# Patient Record
Sex: Male | Born: 1950 | Race: Black or African American | Hispanic: No | Marital: Married | Smoking: Never smoker
Health system: Southern US, Community
[De-identification: ages and names within clinical notes are randomized; demographics above are authoritative.]

## PROBLEM LIST (undated history)

## (undated) ENCOUNTER — Ambulatory Visit (HOSPITAL_COMMUNITY): Admission: EM | Payer: Medicare PPO

## (undated) DIAGNOSIS — I10 Essential (primary) hypertension: Secondary | ICD-10-CM

## (undated) DIAGNOSIS — M109 Gout, unspecified: Secondary | ICD-10-CM

## (undated) DIAGNOSIS — I441 Atrioventricular block, second degree: Secondary | ICD-10-CM

## (undated) DIAGNOSIS — J189 Pneumonia, unspecified organism: Secondary | ICD-10-CM

## (undated) DIAGNOSIS — R011 Cardiac murmur, unspecified: Secondary | ICD-10-CM

## (undated) DIAGNOSIS — E669 Obesity, unspecified: Secondary | ICD-10-CM

## (undated) DIAGNOSIS — I509 Heart failure, unspecified: Secondary | ICD-10-CM

## (undated) DIAGNOSIS — J90 Pleural effusion, not elsewhere classified: Secondary | ICD-10-CM

## (undated) DIAGNOSIS — N529 Male erectile dysfunction, unspecified: Secondary | ICD-10-CM

## (undated) DIAGNOSIS — N400 Enlarged prostate without lower urinary tract symptoms: Secondary | ICD-10-CM

## (undated) DIAGNOSIS — E785 Hyperlipidemia, unspecified: Secondary | ICD-10-CM

## (undated) DIAGNOSIS — N189 Chronic kidney disease, unspecified: Secondary | ICD-10-CM

## (undated) HISTORY — DX: Obesity, unspecified: E66.9

## (undated) HISTORY — DX: Gout, unspecified: M10.9

## (undated) HISTORY — DX: Benign prostatic hyperplasia without lower urinary tract symptoms: N40.0

## (undated) HISTORY — DX: Hyperlipidemia, unspecified: E78.5

## (undated) HISTORY — DX: Cardiac murmur, unspecified: R01.1

## (undated) HISTORY — DX: Male erectile dysfunction, unspecified: N52.9

## (undated) HISTORY — PX: CARDIAC SURGERY: SHX584

---

## 1999-02-13 ENCOUNTER — Encounter: Payer: Self-pay | Admitting: Family Medicine

## 1999-02-13 ENCOUNTER — Ambulatory Visit (HOSPITAL_COMMUNITY): Admission: RE | Admit: 1999-02-13 | Discharge: 1999-02-13 | Payer: Self-pay | Admitting: Family Medicine

## 2000-02-26 ENCOUNTER — Ambulatory Visit (HOSPITAL_COMMUNITY): Admission: RE | Admit: 2000-02-26 | Discharge: 2000-02-26 | Payer: Self-pay | Admitting: Family Medicine

## 2000-02-26 ENCOUNTER — Encounter: Payer: Self-pay | Admitting: Family Medicine

## 2002-03-21 ENCOUNTER — Encounter: Payer: Self-pay | Admitting: Emergency Medicine

## 2002-03-21 ENCOUNTER — Emergency Department (HOSPITAL_COMMUNITY): Admission: EM | Admit: 2002-03-21 | Discharge: 2002-03-21 | Payer: Self-pay | Admitting: Emergency Medicine

## 2002-03-22 ENCOUNTER — Encounter: Payer: Self-pay | Admitting: Emergency Medicine

## 2002-03-22 ENCOUNTER — Emergency Department (HOSPITAL_COMMUNITY): Admission: EM | Admit: 2002-03-22 | Discharge: 2002-03-22 | Payer: Self-pay | Admitting: Emergency Medicine

## 2002-03-22 ENCOUNTER — Ambulatory Visit (HOSPITAL_COMMUNITY): Admission: RE | Admit: 2002-03-22 | Discharge: 2002-03-22 | Payer: Self-pay | Admitting: Gastroenterology

## 2002-03-22 ENCOUNTER — Encounter: Payer: Self-pay | Admitting: Gastroenterology

## 2002-03-23 ENCOUNTER — Ambulatory Visit (HOSPITAL_COMMUNITY): Admission: RE | Admit: 2002-03-23 | Discharge: 2002-03-23 | Payer: Self-pay | Admitting: Gastroenterology

## 2002-03-23 ENCOUNTER — Encounter: Payer: Self-pay | Admitting: Gastroenterology

## 2002-04-23 ENCOUNTER — Ambulatory Visit (HOSPITAL_COMMUNITY): Admission: RE | Admit: 2002-04-23 | Discharge: 2002-04-23 | Payer: Self-pay | Admitting: Gastroenterology

## 2002-06-26 ENCOUNTER — Encounter: Payer: Self-pay | Admitting: Emergency Medicine

## 2002-06-26 ENCOUNTER — Inpatient Hospital Stay (HOSPITAL_COMMUNITY): Admission: EM | Admit: 2002-06-26 | Discharge: 2002-07-02 | Payer: Self-pay | Admitting: Emergency Medicine

## 2002-06-27 ENCOUNTER — Encounter: Payer: Self-pay | Admitting: Thoracic Surgery (Cardiothoracic Vascular Surgery)

## 2002-06-28 ENCOUNTER — Encounter: Payer: Self-pay | Admitting: Anesthesiology

## 2004-01-08 ENCOUNTER — Emergency Department (HOSPITAL_COMMUNITY): Admission: EM | Admit: 2004-01-08 | Discharge: 2004-01-08 | Payer: Self-pay | Admitting: Family Medicine

## 2004-01-10 ENCOUNTER — Emergency Department (HOSPITAL_COMMUNITY): Admission: EM | Admit: 2004-01-10 | Discharge: 2004-01-10 | Payer: Self-pay | Admitting: Emergency Medicine

## 2004-01-30 ENCOUNTER — Ambulatory Visit (HOSPITAL_COMMUNITY): Admission: RE | Admit: 2004-01-30 | Discharge: 2004-01-30 | Payer: Self-pay | Admitting: Gastroenterology

## 2016-01-02 ENCOUNTER — Other Ambulatory Visit: Payer: Self-pay | Admitting: Gastroenterology

## 2016-11-02 ENCOUNTER — Emergency Department (HOSPITAL_COMMUNITY)
Admission: EM | Admit: 2016-11-02 | Discharge: 2016-11-02 | Disposition: A | Discharge status: Home / Self Care | Payer: Worker's Compensation | Attending: Emergency Medicine | Admitting: Emergency Medicine

## 2016-11-02 ENCOUNTER — Encounter (HOSPITAL_COMMUNITY): Payer: Self-pay | Admitting: Emergency Medicine

## 2016-11-02 ENCOUNTER — Emergency Department (HOSPITAL_COMMUNITY): Payer: Worker's Compensation

## 2016-11-02 DIAGNOSIS — S0101XA Laceration without foreign body of scalp, initial encounter: Secondary | ICD-10-CM

## 2016-11-02 DIAGNOSIS — Y990 Civilian activity done for income or pay: Secondary | ICD-10-CM | POA: Insufficient documentation

## 2016-11-02 DIAGNOSIS — Y939 Activity, unspecified: Secondary | ICD-10-CM | POA: Diagnosis not present

## 2016-11-02 DIAGNOSIS — S0181XA Laceration without foreign body of other part of head, initial encounter: Secondary | ICD-10-CM | POA: Insufficient documentation

## 2016-11-02 DIAGNOSIS — S0990XA Unspecified injury of head, initial encounter: Secondary | ICD-10-CM | POA: Insufficient documentation

## 2016-11-02 DIAGNOSIS — Y929 Unspecified place or not applicable: Secondary | ICD-10-CM | POA: Diagnosis not present

## 2016-11-02 DIAGNOSIS — W19XXXA Unspecified fall, initial encounter: Secondary | ICD-10-CM

## 2016-11-02 DIAGNOSIS — W2201XA Walked into wall, initial encounter: Secondary | ICD-10-CM | POA: Insufficient documentation

## 2016-11-02 HISTORY — DX: Essential (primary) hypertension: I10

## 2016-11-02 MED ORDER — BACITRACIN ZINC 500 UNIT/GM EX OINT
TOPICAL_OINTMENT | Freq: Once | CUTANEOUS | Status: AC
  Administered 2016-11-02: 1 via TOPICAL
  Filled 2016-11-02: qty 0.9

## 2016-11-02 MED ORDER — LIDOCAINE-EPINEPHRINE (PF) 2 %-1:200000 IJ SOLN
20.0000 mL | Freq: Once | INTRAMUSCULAR | Status: AC
  Administered 2016-11-02: 20 mL
  Filled 2016-11-02: qty 20

## 2016-11-02 MED ORDER — NAPROXEN 375 MG PO TABS
375.0000 mg | ORAL_TABLET | Freq: Once | ORAL | Status: AC
  Administered 2016-11-02: 375 mg via ORAL
  Filled 2016-11-02: qty 1

## 2016-11-02 NOTE — ED Triage Notes (Signed)
Patient is from home.  Patient tripped over a patient at work and hit his forehead on door jam.  Denies LOC or dizziness.  Patient denies neck or back pain.  Patient passed [facility] with EMS.    BP: 188/106 HR: 88 R: 20

## 2016-11-02 NOTE — ED Notes (Signed)
Patient states he has not taken his BP medication this morning.

## 2016-11-02 NOTE — ED Notes (Signed)
Bed: [facility] Expected date:  Expected time:  Means of arrival:  Comments: 66 yo f fall

## 2016-11-02 NOTE — ED Notes (Signed)
Patient is A & O x4.  He understood the AVS discharge papers without any questions.

## 2016-11-02 NOTE — ED Provider Notes (Signed)
[facility] DEPT Provider Note   CSN: [id number] Arrival date & time: 11/02/16  1041  By signing my name below, I, Andrew Schmitt, attest that this documentation has been prepared under the direction and in the presence of Doristine Devoid, PA-C. Electronically Signed: Sonum Schmitt, Scribe. 11/02/16. 11:05 AM.  History   Chief Complaint Chief Complaint  Patient presents with  . Fall    The history is provided by the patient. No language interpreter was used.    HPI Comments: Andrew Schmitt is a 66 y.o. male brought in by ambulance, who presents to the Emergency Department complaining of a fall that occurred PTA. Patient states he tripped over a patient at work, causing him to strike his forehead on the corner of a wall. He denies LOC. He currently complains of a laceration to the forehead and paresthesia to bilateral hands. He takes a daily baby ASA. He denies vision changes, lightheadedness, nausea, vomiting, photophobia, neck pain, back pain. He states his tetanus is UTD.   No past medical history on file.  There are no active problems to display for this patient.   No past surgical history on file.     Home Medications    Prior to Admission medications   Not on File    Family History No family history on file.  Social History Social History  Substance Use Topics  . Smoking status: Not on file  . Smokeless tobacco: Not on file  . Alcohol use Not on file     Allergies   Patient has no allergy information on record.   Review of Systems Review of Systems  Eyes: Negative for photophobia and visual disturbance.  Gastrointestinal: Negative for nausea and vomiting.  Musculoskeletal: Negative for back pain and neck pain.  Skin: Positive for wound.  Neurological: Negative for dizziness, syncope and light-headedness.  All other systems reviewed and are negative.    Physical Exam Updated Vital Signs BP (!) 193/101   Pulse 67   Temp 98.5 F (36.9 C)  (Oral)   Resp 16   Ht 5\' 6"  (1.676 m)   Wt 124.7 kg   SpO2 97%   BMI 44.39 kg/m   Physical Exam  Constitutional: He is oriented to person, place, and time. He appears well-developed and well-nourished.  HENT:  Head: Normocephalic.  Right Ear: External ear normal. No hemotympanum.  Left Ear: External ear normal. No hemotympanum.  Nose: Nose normal. No nasal septal hematoma.  1.5 cm laceration to the right forehead. Bleeding is controlled   Eyes: EOM are normal. Pupils are equal, round, and reactive to light.  Neck: Normal range of motion. Neck supple. No thyromegaly present.  No midline C-spine tenderness. No deformities or step-offs noted.  Cardiovascular: Normal rate, regular rhythm, normal heart sounds and intact distal pulses.  Exam reveals no gallop and no friction rub.   No murmur heard. Pulmonary/Chest: Effort normal and breath sounds normal. No respiratory distress. He has no wheezes. He has no rales.  Musculoskeletal: Normal range of motion.  No midline L-spine or T-spine tenderness. No deformities or step-offs noted. No paraspinal tenderness.  Lymphadenopathy:    He has no cervical adenopathy.  Neurological: He is alert and oriented to person, place, and time.  The patient is alert, attentive, and oriented x 3. Speech is clear. Cranial nerve II-VII grossly intact. Negative pronator drift. Sensation intact. Strength 5/5 in all extremities. Reflexes 2+ and symmetric at biceps, triceps, knees, and ankles. Rapid alternating movement and fine finger  movements intact. Romberg is absent. Posture and gait normal.    Skin: Skin is warm and dry. Capillary refill takes less than 2 seconds.  Psychiatric: He has a normal mood and affect.  Nursing note and vitals reviewed.    ED Treatments / Results  DIAGNOSTIC STUDIES: Oxygen Saturation is 100% on RA, normal by my interpretation.    COORDINATION OF CARE: 11:05 AM Discussed treatment plan with pt at bedside and pt agreed to  plan.   Labs (all labs ordered are listed, but only abnormal results are displayed) Labs Reviewed - No data to display  EKG  EKG Interpretation None       Radiology Ct Head Wo Contrast  Result Date: 11/02/2016 CLINICAL DATA:  Head injury after fall. EXAM: CT HEAD WITHOUT CONTRAST CT CERVICAL SPINE WITHOUT CONTRAST TECHNIQUE: Multidetector CT imaging of the head and cervical spine was performed following the standard protocol without intravenous contrast. Multiplanar CT image reconstructions of the cervical spine were also generated. COMPARISON:  None. FINDINGS: CT HEAD FINDINGS Brain: Mild chronic ischemic white matter disease is noted. No mass effect or midline shift is noted. Ventricular size is within normal limits. There is no evidence of mass lesion, hemorrhage or acute infarction. Vascular: No hyperdense vessel or unexpected calcification. Skull: Normal. Negative for fracture or focal lesion. Sinuses/Orbits: No acute finding. Other: None. CT CERVICAL SPINE FINDINGS Alignment: Mild reversal of normal lordosis is noted which most likely is positional in origin. Skull base and vertebrae: No acute fracture. No primary bone lesion or focal pathologic process. Soft tissues and spinal canal: No prevertebral fluid or swelling. No visible canal hematoma. Disc levels:  Mild degenerative disc disease is noted at C3-4. Upper chest: Negative. Other: Degenerative changes seen involving the posterior facet joints bilaterally IMPRESSION: Mild chronic ischemic white matter disease. No acute intracranial abnormality seen. Mild degenerative disc disease is noted at C3-4. No acute abnormality seen in the cervical spine. Electronically Signed   By: Marijo Conception, M.D.   On: 11/02/2016 12:00   Ct Cervical Spine Wo Contrast  Result Date: 11/02/2016 CLINICAL DATA:  Head injury after fall. EXAM: CT HEAD WITHOUT CONTRAST CT CERVICAL SPINE WITHOUT CONTRAST TECHNIQUE: Multidetector CT imaging of the head and  cervical spine was performed following the standard protocol without intravenous contrast. Multiplanar CT image reconstructions of the cervical spine were also generated. COMPARISON:  None. FINDINGS: CT HEAD FINDINGS Brain: Mild chronic ischemic white matter disease is noted. No mass effect or midline shift is noted. Ventricular size is within normal limits. There is no evidence of mass lesion, hemorrhage or acute infarction. Vascular: No hyperdense vessel or unexpected calcification. Skull: Normal. Negative for fracture or focal lesion. Sinuses/Orbits: No acute finding. Other: None. CT CERVICAL SPINE FINDINGS Alignment: Mild reversal of normal lordosis is noted which most likely is positional in origin. Skull base and vertebrae: No acute fracture. No primary bone lesion or focal pathologic process. Soft tissues and spinal canal: No prevertebral fluid or swelling. No visible canal hematoma. Disc levels:  Mild degenerative disc disease is noted at C3-4. Upper chest: Negative. Other: Degenerative changes seen involving the posterior facet joints bilaterally IMPRESSION: Mild chronic ischemic white matter disease. No acute intracranial abnormality seen. Mild degenerative disc disease is noted at C3-4. No acute abnormality seen in the cervical spine. Electronically Signed   By: Marijo Conception, M.D.   On: 11/02/2016 12:00    Procedures Procedures (including critical care time)  LACERATION REPAIR PROCEDURE NOTE The patient's identification  was confirmed and consent was obtained. This procedure was performed by Doristine Devoid, PA-C at 12:49 PM. Site: right forehead Sterile procedures observed Anesthetic used (type and amt): 1% lidocaine with epi, 5 cm Suture type/size: 6-0 prolene Length:2 cm # of Sutures: 7 Technique:simple interrupted Complexity: simple Antibx ointment applied Tetanus UTD Site anesthetized, irrigated with NS, explored without evidence of foreign body, wound well approximated, site  covered with dry, sterile dressing.  Patient tolerated procedure well without complications. Instructions for care discussed verbally and patient provided with additional written instructions for homecare and f/u.  Medications Ordered in ED Medications  lidocaine-EPINEPHrine (XYLOCAINE W/EPI) 2 %-1:200000 (PF) injection 20 mL (20 mLs Infiltration Given 11/02/16 1142)  naproxen (NAPROSYN) tablet 375 mg (375 mg Oral Given 11/02/16 1257)  bacitracin ointment (1 application Topical Given 11/02/16 1324)     Initial Impression / Assessment and Plan / ED Course  I have reviewed the triage vital signs and the nursing notes.  Pertinent labs & imaging results that were available during my care of the patient were reviewed by me and considered in my medical decision making (see chart for details).     Patient presents to the ED with mechanical fall today. Laceration noted to the right forehead with bleeding controlled. Imaging obtained prior to wound closure with normal head and cervical CT. Tetanus is up-to-date. No focal neuro deficits. Reports bilateral tingling in hands however sensation intact sharp/dull and proprioception intact. Pressure irrigation performed. Laceration occurred < 8 hours prior to repair which was well tolerated. Pt has no co morbidities to effect normal wound healing. Discussed suture home care w pt and answered questions. Pt to f-u for wound check and suture removal in 3-5 days. Blood pressure is elevated in the ED. History of hypertension. Has not taken his blood pressure medicine this morning. Denies any associated complaints. Encouraged to take his blood pressure medicine when he gets home. Pt is hemodynamically stable w no complaints prior to dc. Encouraged symptomatic treatment at home. Pt seen and examined by Dr. Maryan Rued who is agreeable to the above plan.     Final Clinical Impressions(s) / ED Diagnoses   Final diagnoses:  Fall, initial encounter  Laceration of scalp,  initial encounter    New Prescriptions New Prescriptions   No medications on file   I personally performed the services described in this documentation, which was scribed in my presence. The recorded information has been reviewed and is accurate.    Doristine Devoid, PA-C 11/03/16 1528    Blanchie Dessert, MD 11/03/16 1630

## 2016-11-02 NOTE — ED Notes (Signed)
Bed: [facility] Expected date:  Expected time:  Means of arrival:  Comments: EMS- 66yo M, fall/head lac

## 2016-11-02 NOTE — Discharge Instructions (Signed)
All of your imaging has been normal. You need to return or follow-up with her primary care doctor in 3-5 days for removal of the sutures. Discussed wound care. Return sooner if he developing signs of infection including redness, drainage, worsening pain, fever. Have given you education on signs and symptoms of concussion to return to the ED. Motrin and Tylenol for pain. Please take her blood pressure medicine when he gets home.

## 2016-11-02 NOTE — ED Notes (Signed)
Patient transported to CT 

## 2018-05-17 ENCOUNTER — Other Ambulatory Visit: Payer: Self-pay | Admitting: Internal Medicine

## 2018-05-25 ENCOUNTER — Ambulatory Visit: Payer: Self-pay | Admitting: Internal Medicine

## 2018-08-03 ENCOUNTER — Other Ambulatory Visit: Payer: Self-pay | Admitting: Internal Medicine

## 2018-08-15 ENCOUNTER — Other Ambulatory Visit: Payer: Self-pay | Admitting: Internal Medicine

## 2018-09-13 ENCOUNTER — Other Ambulatory Visit: Payer: Self-pay | Admitting: Internal Medicine

## 2018-09-13 MED ORDER — OSELTAMIVIR PHOSPHATE 75 MG PO CAPS: 75.0000 mg | ORAL_CAPSULE | Freq: Every day | ORAL | 0 refills | Status: DC

## 2018-10-15 ENCOUNTER — Other Ambulatory Visit: Payer: Self-pay | Admitting: Internal Medicine

## 2018-11-04 ENCOUNTER — Other Ambulatory Visit: Payer: Self-pay | Admitting: Internal Medicine

## 2018-11-08 ENCOUNTER — Other Ambulatory Visit: Payer: Self-pay | Admitting: Internal Medicine

## 2019-01-03 ENCOUNTER — Telehealth: Payer: Self-pay

## 2019-01-03 ENCOUNTER — Other Ambulatory Visit: Payer: Self-pay | Admitting: Internal Medicine

## 2019-01-03 NOTE — Telephone Encounter (Signed)
Called to make an appt with pt due to him not being seen in a long time and pt needs refills

## 2019-01-18 ENCOUNTER — Encounter: Payer: Self-pay | Admitting: Nurse Practitioner

## 2019-01-18 ENCOUNTER — Ambulatory Visit: Payer: Medicare Other | Admitting: Nurse Practitioner

## 2019-01-18 ENCOUNTER — Other Ambulatory Visit: Payer: Self-pay

## 2019-01-18 VITALS — BP 142/100 | HR 66 | Temp 98.4°F | Ht 67.2 in | Wt 275.8 lb

## 2019-01-18 DIAGNOSIS — E782 Mixed hyperlipidemia: Secondary | ICD-10-CM

## 2019-01-18 DIAGNOSIS — I10 Essential (primary) hypertension: Secondary | ICD-10-CM

## 2019-01-18 DIAGNOSIS — Z9114 Patient's other noncompliance with medication regimen: Secondary | ICD-10-CM | POA: Diagnosis not present

## 2019-01-18 LAB — POCT URINALYSIS DIPSTICK
Bilirubin, UA: NEGATIVE
Blood, UA: NEGATIVE
Glucose, UA: NEGATIVE
Ketones, UA: NEGATIVE
Leukocytes, UA: NEGATIVE
Nitrite, UA: NEGATIVE
Protein, UA: NEGATIVE
Spec Grav, UA: 1.025 (ref 1.010–1.025)
Urobilinogen, UA: 1 E.U./dL
pH, UA: 5.5 (ref 5.0–8.0)

## 2019-01-18 LAB — POCT UA - MICROALBUMIN
Albumin/Creatinine Ratio, Urine, POC: 300
Creatinine, POC: 100 mg/dL
Microalbumin Ur, POC: 150 mg/L

## 2019-01-18 NOTE — Progress Notes (Signed)
Subjective:     Patient ID: Andrew Schmitt , male    DOB: [birth year] , 68 y.o.   MRN: [id number]   Chief Complaint  Patient presents with  . Hypertension    HPI  Hypertension  This is a chronic problem. The current episode started more than 1 year ago. The problem has been gradually worsening since onset. The problem is uncontrolled. Pertinent negatives include no anxiety. There are no associated agents to hypertension. Risk factors for coronary artery disease include sedentary lifestyle, obesity, male gender, dyslipidemia and diabetes mellitus. Past treatments include calcium channel blockers and diuretics. The current treatment provides no improvement. There are no compliance problems.  There is no history of angina or kidney disease. There is no history of chronic renal disease.     Past Medical History:  Diagnosis Date  . Gout   . Hypertension      No family history on file.   Current Outpatient Medications:  .  allopurinol (ZYLOPRIM) 300 MG tablet, TAKE 1 TABLET BY MOUTH EVERY DAY, Disp: 90 tablet, Rfl: 1 .  amLODipine (NORVASC) 10 MG tablet, Take 10 mg by mouth daily., Disp: , Rfl:  .  aspirin EC 81 MG tablet, Take 81 mg by mouth daily., Disp: , Rfl:  .  carvedilol (COREG) 6.25 MG tablet, TAKE ONE TABLET BY MOUTH TWICE A DAY WITH FOOD, Disp: 180 tablet, Rfl: 0 .  chlorthalidone (HYGROTON) 50 MG tablet, Take 50 mg by mouth daily., Disp: , Rfl:  .  cholecalciferol (VITAMIN D) 1000 units tablet, Take 1,000 Units by mouth daily., Disp: , Rfl:  .  finasteride (PROSCAR) 5 MG tablet, Take 5 mg by mouth daily., Disp: , Rfl:  .  furosemide (LASIX) 20 MG tablet, Take 20 mg by mouth daily., Disp: , Rfl:  .  hydrALAZINE (APRESOLINE) 50 MG tablet, Take 50 mg by mouth 2 (two) times daily., Disp: , Rfl:  .  rosuvastatin (CRESTOR) 10 MG tablet, TAKE 1 TABLET BY MOUTH EVERY DAY, Disp: 90 tablet, Rfl: 0 .  tamsulosin (FLOMAX) 0.4 MG CAPS capsule, Take 0.4 mg by mouth daily., Disp: , Rfl:     No Known Allergies   Review of Systems   Today's Vitals   01/18/19 1132  BP: (!) 142/100  Pulse: 66  Temp: 98.4 F (36.9 C)  TempSrc: Oral  Weight: 275 lb 12.8 oz (125.1 kg)  Height: 5' 7.2" (1.707 m)  PainSc: 0-No pain   Body mass index is 42.94 kg/m.   Objective:  Physical Exam Vitals signs reviewed.  Constitutional:      Appearance: Normal appearance.  Cardiovascular:     Rate and Rhythm: Normal rate and regular rhythm.     Pulses: Normal pulses.     Heart sounds: Normal heart sounds. No murmur.  Pulmonary:     Effort: Pulmonary effort is normal.     Breath sounds: Normal breath sounds.  Skin:    General: Skin is warm and dry.     Capillary Refill: Capillary refill takes less than 2 seconds.  Neurological:     General: No focal deficit present.     Mental Status: He is alert and oriented to person, place, and time.  Psychiatric:        Mood and Affect: Mood normal.        Behavior: Behavior normal.        Thought Content: Thought content normal.        Judgment: Judgment normal.  Assessment And Plan:     1. Essential hypertension  Chronic  Poorly controlled, he has been without his blood pressure medications.  I discussed with him the importance of compliance to care and coming back for regular follow ups  He was educated on the risk for stroke and heart attack with elevated blood pressures.  Encouraged to limit intake of high salt foods.  He will need to return for follow up blood pressure visit in a couple weeks - POCT Urinalysis Dipstick (81002) - POCT UA - Microalbumin - CBC no Diff - CMP14 + Anion Gap  2. Mixed hyperlipidemia  Chronic, controlled  Continue with current medications - Lipid Profile   Minette Brine, FNP    THE PATIENT IS ENCOURAGED TO PRACTICE SOCIAL DISTANCING DUE TO THE COVID-19 PANDEMIC.

## 2019-01-19 LAB — LIPID PANEL
Chol/HDL Ratio: 3.9 ratio (ref 0.0–5.0)
Cholesterol, Total: 145 mg/dL (ref 100–199)
HDL: 37 mg/dL — ABNORMAL LOW (ref >39)
LDL Calculated: 71 mg/dL (ref 0–99)
Triglycerides: 185 mg/dL — ABNORMAL HIGH (ref 0–149)
VLDL Cholesterol Cal: 37 mg/dL (ref 5–40)

## 2019-01-19 LAB — CMP14 + ANION GAP
ALT: 18 IU/L (ref 0–44)
AST: 21 IU/L (ref 0–40)
Albumin/Globulin Ratio: 1.5 (ref 1.2–2.2)
Albumin: 4.4 g/dL (ref 3.8–4.8)
Alkaline Phosphatase: 67 IU/L (ref 39–117)
Anion Gap: 13 mmol/L (ref 10.0–18.0)
BUN/Creatinine Ratio: 20 (ref 10–24)
BUN: 32 mg/dL — ABNORMAL HIGH (ref 8–27)
Bilirubin Total: 0.6 mg/dL (ref 0.0–1.2)
CO2: 25 mmol/L (ref 20–29)
Calcium: 9.2 mg/dL (ref 8.6–10.2)
Chloride: 100 mmol/L (ref 96–106)
Creatinine, Ser: 1.57 mg/dL — ABNORMAL HIGH (ref 0.76–1.27)
GFR calc Af Amer: 51 mL/min/{1.73_m2} — ABNORMAL LOW (ref >59)
GFR calc non Af Amer: 44 mL/min/{1.73_m2} — ABNORMAL LOW (ref >59)
Globulin, Total: 2.9 g/dL (ref 1.5–4.5)
Glucose: 113 mg/dL — ABNORMAL HIGH (ref 65–99)
Potassium: 4.1 mmol/L (ref 3.5–5.2)
Sodium: 138 mmol/L (ref 134–144)
Total Protein: 7.3 g/dL (ref 6.0–8.5)

## 2019-01-19 LAB — CBC
Hematocrit: 38.9 % (ref 37.5–51.0)
Hemoglobin: 12.7 g/dL — ABNORMAL LOW (ref 13.0–17.7)
MCH: 29.8 pg (ref 26.6–33.0)
MCHC: 32.6 g/dL (ref 31.5–35.7)
MCV: 91 fL (ref 79–97)
Platelets: 168 10*3/uL (ref 150–450)
RBC: 4.26 x10E6/uL (ref 4.14–5.80)
RDW: 11.8 % (ref 11.6–15.4)
WBC: 4.5 10*3/uL (ref 3.4–10.8)

## 2019-02-12 ENCOUNTER — Encounter: Payer: Self-pay | Admitting: Nurse Practitioner

## 2019-03-05 ENCOUNTER — Other Ambulatory Visit: Payer: Self-pay

## 2019-03-05 ENCOUNTER — Other Ambulatory Visit: Payer: Self-pay | Admitting: Internal Medicine

## 2019-03-05 MED ORDER — HYDRALAZINE HCL 100 MG PO TABS: 100.0000 mg | ORAL_TABLET | Freq: Three times a day (TID) | ORAL | 3 refills | Status: DC

## 2019-03-07 DIAGNOSIS — E7800 Pure hypercholesterolemia, unspecified: Secondary | ICD-10-CM | POA: Insufficient documentation

## 2019-03-07 DIAGNOSIS — E785 Hyperlipidemia, unspecified: Secondary | ICD-10-CM | POA: Insufficient documentation

## 2019-03-07 DIAGNOSIS — I119 Hypertensive heart disease without heart failure: Secondary | ICD-10-CM | POA: Insufficient documentation

## 2019-03-07 DIAGNOSIS — I259 Chronic ischemic heart disease, unspecified: Secondary | ICD-10-CM | POA: Insufficient documentation

## 2019-03-07 NOTE — Progress Notes (Signed)
Subjective:  Primary Physician:  Glendale Chard, MD  Patient ID: Andrew Schmitt, male    DOB: 1950/10/03, 68 y.o.   MRN: [id number]  Chief Complaint  Patient presents with  . Hypertension  . Follow-up   HPI: Andrew Schmitt  is a 68 y.o. male who presents for a follow-up of hypertensive heart disease and ischemic heart disease. Patient feels short of breath  on walking at incline, unchanged from before There is no dyspnea at rest and no orthopnea or PND. Patient denies complaints of chest pain, tightness or pressure. No palpitation, dizziness, near-syncope or syncope. He has chronic swelling on the legs. No complaints of leg claudication.  Patient has hypertension and hypercholesterolemia. No history of diabetes. He does not smoke.  Patient has chronic kidney disease stage 3b. He also has history of gout, GERD, mild anemia, BPH and erectile dysfunction. Patient has morbid obesity. He does not walk regularly. Patient has been taking care of his grandchild at home.  No history of thyroid problems. No history of TIA or CVA.  Past Medical History:  Diagnosis Date  . Gout   . Hypertension     Past Surgical History:  Procedure Laterality Date  . CARDIAC SURGERY- Pericardiocentesis- 2002      Social History   Socioeconomic History  . Marital status: Married    Spouse name: Not on file  . Number of children: Not on file  . Years of education: Not on file  . Highest education level: Not on file  Occupational History  . Not on file  Social Needs  . Financial resource strain: Not on file  . Food insecurity    Worry: Not on file    Inability: Not on file  . Transportation needs    Medical: Not on file    Non-medical: Not on file  Tobacco Use  . Smoking status: Never Smoker  . Smokeless tobacco: Never Used  Substance and Sexual Activity  . Alcohol use: No  . Drug use: No  . Sexual activity: Never  Lifestyle  . Physical activity    Days per week: Not on file   Minutes per session: Not on file  . Stress: Not on file  Relationships  . Social Herbalist on phone: Not on file    Gets together: Not on file    Attends religious service: Not on file    Active member of club or organization: Not on file    Attends meetings of clubs or organizations: Not on file    Relationship status: Not on file  . Intimate partner violence    Fear of current or ex partner: Not on file    Emotionally abused: Not on file    Physically abused: Not on file    Forced sexual activity: Not on file  Other Topics Concern  . Not on file  Social History Narrative  . Not on file    Current Outpatient Medications on File Prior to Visit  Medication Sig Dispense Refill  . alfuzosin (UROXATRAL) 10 MG 24 hr tablet Take 10 mg by mouth daily with breakfast.    . allopurinol (ZYLOPRIM) 300 MG tablet TAKE 1 TABLET BY MOUTH EVERY DAY 90 tablet 1  . amLODipine (NORVASC) 10 MG tablet Take 10 mg by mouth daily.    Marland Kitchen aspirin EC 81 MG tablet Take 81 mg by mouth daily.    . carvedilol (COREG) 6.25 MG tablet TAKE ONE TABLET BY MOUTH TWICE  A DAY WITH FOOD 180 tablet 0  . chlorthalidone (HYGROTON) 50 MG tablet Take 50 mg by mouth daily.    . cholecalciferol (VITAMIN D) 1000 units tablet Take 1,000 Units by mouth daily.    Marland Kitchen docusate sodium (COLACE) 100 MG capsule Take 100 mg by mouth 2 (two) times daily.    . finasteride (PROSCAR) 5 MG tablet Take 5 mg by mouth daily.    . furosemide (LASIX) 20 MG tablet TAKE 1 TABLET BY MOUTH DAILY 90 tablet 1  . Garlic 10 MG CAPS Take by mouth.    . hydrALAZINE (APRESOLINE) 100 MG tablet Take 1 tablet (100 mg total) by mouth 3 (three) times daily. 90 tablet 3  . ibuprofen (ADVIL) 200 MG tablet Take 200 mg by mouth every 6 (six) hours as needed.    . linaclotide (LINZESS) 145 MCG CAPS capsule Take 145 mcg by mouth daily before breakfast.    . rosuvastatin (CRESTOR) 10 MG tablet TAKE 1 TABLET BY MOUTH EVERY DAY 90 tablet 1  . sildenafil  (REVATIO) 20 MG tablet Take 1 tablet (20 mg total) by mouth daily as needed. 30 tablet 3  . tamsulosin (FLOMAX) 0.4 MG CAPS capsule Take 0.4 mg by mouth daily.    . vitamin B-12 (CYANOCOBALAMIN) 500 MCG tablet Take 500 mcg by mouth daily.     No current facility-administered medications on file prior to visit.     Review of Systems  Constitutional: Negative for fever.  HENT: Negative for nosebleeds.   Eyes: Negative for blurred vision.  Respiratory: Positive for shortness of breath (on exertion). Negative for cough.   Cardiovascular: Positive for leg swelling (mild). Negative for chest pain and palpitations.  Gastrointestinal: Negative for abdominal pain, nausea and vomiting.  Genitourinary: Negative for dysuria.  Musculoskeletal: Negative for myalgias.  Skin: Negative for itching and rash.  Neurological: Negative for dizziness, seizures and loss of consciousness.  Psychiatric/Behavioral: The patient is not nervous/anxious.       Objective:  Blood pressure 129/71, pulse 65, temperature 97.7 F (36.5 C), height 5\' 7"  (1.702 m), weight 275 lb (124.7 kg), SpO2 97 %. Body mass index is 43.07 kg/m.  Physical Exam  Constitutional: He is oriented to person, place, and time. He appears well-developed.  Morbidly obese.  HENT:  Head: Normocephalic and atraumatic.  Eyes: Conjunctivae are normal.  Neck: No thyromegaly present.  Cardiovascular: Normal rate, regular rhythm and normal heart sounds. Exam reveals no gallop.  No murmur heard. Pulses:      Carotid pulses are 2+ on the right side and 2+ on the left side.      Dorsalis pedis pulses are 2+ on the right side and 2+ on the left side.  Pulmonary/Chest: He has no wheezes. He has no rales.  Abdominal: He exhibits no mass. There is no abdominal tenderness.  Musculoskeletal:        General: Edema (1+ edema on legs, feet) present.  Lymphadenopathy:    He has no cervical adenopathy.  Neurological: He is alert and oriented to person,  place, and time.  Skin: Skin is warm.    CARDIAC STUDIES:  Echocardiogram 05/27/2016: Left ventricle cavity is normal in size. Severe concentric hypertrophy of the left ventricle with septal and posterior wall measurement of 17-18 mm. Normal global wall motion. Doppler evidence of grade II (pseudonormal) diastolic dysfunction. Diastolic dysfunction do not suggest elevated LA/LV endiastolic pressure. Calculated EF 74%. Trace aortic regurgitation. Trace mitral regurgitation. Trace tricuspid regurgitation. Unable to estimate PA pressure  due to absence/minimal TR signal.  Lexiscan myoview stress test 05/07/2016: 1. The resting electrocardiogram demonstrated normal sinus rhythm, normal resting conduction and no resting arrhythmias. Stress EKG is non-diagnostic for ischemia as it a pharmacologic stress using Lexiscan. 2. The raw images reveal the left ventricle to be mildly dilated at 186 mL in both rest and stress images. Perfusion imaging reveals a small to moderate-sized inferior wall scar extending from the base towards the apex with no significant peri-infarct ischemia. In addition there is a very small sized severe ischemia in the apical lateral wall extending from the base towards the apex probably in the distribution of the diagonal branch of the LAD. Left ventricular systolic function was preserved with inferior wall hypokinesis, EF 59%. This is an intermediate risk study, clinical correlation recommended in a patient with BMI 45.  Assessment & Recommendations:   1. Hypertension with heart disease  2. Ischemic heart disease EKG- Sinus  Rhythm  -First degree A-V block  Incomplete RBBB. -Left atrial enlargement. Can't R/O old inf. MI.  3. Hyperlipidemia  4. Morbid obesity ([place])  Laboratory Exam:  CBC Latest Ref Rng & Units 01/18/2019  WBC 3.4 - 10.8 x10E3/uL 4.5  Hemoglobin 13.0 - 17.7 g/dL 12.7(L)  Hematocrit 37.5 - 51.0 % 38.9  Platelets 150 - 450 x10E3/uL 168   CMP Latest  Ref Rng & Units 01/18/2019  Glucose 65 - 99 mg/dL 113(H)  BUN 8 - 27 mg/dL 32(H)  Creatinine 0.76 - 1.27 mg/dL 1.57(H)  Sodium 134 - 144 mmol/L 138  Potassium 3.5 - 5.2 mmol/L 4.1  Chloride 96 - 106 mmol/L 100  CO2 20 - 29 mmol/L 25  Calcium 8.6 - 10.2 mg/dL 9.2  Total Protein 6.0 - 8.5 g/dL 7.3  Total Bilirubin 0.0 - 1.2 mg/dL 0.6  Alkaline Phos 39 - 117 IU/L 67  AST 0 - 40 IU/L 21  ALT 0 - 44 IU/L 18   Lipid Panel     Component Value Date/Time   CHOL 145 01/18/2019 1619   TRIG 185 (H) 01/18/2019 1619   HDL 37 (L) 01/18/2019 1619   CHOLHDL 3.9 01/18/2019 1619   LDLCALC 71 01/18/2019 1619   Recommendation:  Patient's blood pressure is well controlled. Continue present medications. He was advised to continue monitoring blood pressure at home and call us if it stays high. Cholesterol is also well controlled, continue present medications.  Patient does not have any chest pain. We will continue medical therapy for ischemic heart disease.  Primary prevention was discussed. I have again advised him to follow strict diet to lose weight. He was also advised low-salt, low-cholesterol diet. He was advised to start walking regularly as tolerated.  I will see him in follow-up after 6 months but call us earlier if there are any cardiac problems. Patient will have follow-up echocardiogram before the next visit.  Despina Hick, MD, [facility] 03/13/2019, 4:10 PM [facility] [place] Pager: [phone] Office: [phone] If no answer Cell [phone]

## 2019-03-12 ENCOUNTER — Other Ambulatory Visit: Payer: Self-pay

## 2019-03-12 MED ORDER — SILDENAFIL CITRATE 20 MG PO TABS: 20.0000 mg | ORAL_TABLET | Freq: Every day | ORAL | 3 refills | Status: DC | PRN

## 2019-03-13 ENCOUNTER — Encounter: Payer: Self-pay | Admitting: Cardiology

## 2019-03-13 ENCOUNTER — Other Ambulatory Visit: Payer: Self-pay

## 2019-03-13 ENCOUNTER — Ambulatory Visit: Payer: Medicare Other | Admitting: Cardiology

## 2019-03-13 VITALS — BP 129/71 | HR 65 | Temp 97.7°F | Ht 67.0 in | Wt 275.0 lb

## 2019-03-13 DIAGNOSIS — I119 Hypertensive heart disease without heart failure: Secondary | ICD-10-CM

## 2019-03-13 DIAGNOSIS — I259 Chronic ischemic heart disease, unspecified: Secondary | ICD-10-CM

## 2019-03-13 DIAGNOSIS — E782 Mixed hyperlipidemia: Secondary | ICD-10-CM

## 2019-03-29 ENCOUNTER — Ambulatory Visit (INDEPENDENT_AMBULATORY_CARE_PROVIDER_SITE_OTHER): Payer: Medicare Other

## 2019-03-29 ENCOUNTER — Ambulatory Visit (INDEPENDENT_AMBULATORY_CARE_PROVIDER_SITE_OTHER): Payer: Medicare Other | Admitting: Internal Medicine

## 2019-03-29 ENCOUNTER — Other Ambulatory Visit: Payer: Self-pay

## 2019-03-29 ENCOUNTER — Encounter: Payer: Self-pay | Admitting: Internal Medicine

## 2019-03-29 VITALS — BP 130/70 | HR 78 | Temp 98.9°F | Ht 67.0 in | Wt 274.2 lb

## 2019-03-29 VITALS — BP 156/78 | HR 78 | Temp 98.9°F | Ht 67.0 in | Wt 274.2 lb

## 2019-03-29 DIAGNOSIS — I119 Hypertensive heart disease without heart failure: Secondary | ICD-10-CM

## 2019-03-29 DIAGNOSIS — Z0001 Encounter for general adult medical examination with abnormal findings: Secondary | ICD-10-CM | POA: Diagnosis not present

## 2019-03-29 DIAGNOSIS — Z0000 Encounter for general adult medical examination without abnormal findings: Secondary | ICD-10-CM | POA: Diagnosis not present

## 2019-03-29 DIAGNOSIS — Z1211 Encounter for screening for malignant neoplasm of colon: Secondary | ICD-10-CM | POA: Diagnosis not present

## 2019-03-29 DIAGNOSIS — Z125 Encounter for screening for malignant neoplasm of prostate: Secondary | ICD-10-CM

## 2019-03-29 DIAGNOSIS — I10 Essential (primary) hypertension: Secondary | ICD-10-CM | POA: Diagnosis not present

## 2019-03-29 DIAGNOSIS — E7800 Pure hypercholesterolemia, unspecified: Secondary | ICD-10-CM

## 2019-03-29 DIAGNOSIS — R609 Edema, unspecified: Secondary | ICD-10-CM

## 2019-03-29 LAB — POCT URINALYSIS DIPSTICK
Bilirubin, UA: NEGATIVE
Blood, UA: NEGATIVE
Glucose, UA: NEGATIVE
Ketones, UA: NEGATIVE
Leukocytes, UA: NEGATIVE
Nitrite, UA: NEGATIVE
Protein, UA: POSITIVE — AB
Spec Grav, UA: 1.02 (ref 1.010–1.025)
Urobilinogen, UA: 0.2 E.U./dL
pH, UA: 6.5 (ref 5.0–8.0)

## 2019-03-29 LAB — CMP14 + ANION GAP
ALT: 18 IU/L (ref 0–44)
AST: 22 IU/L (ref 0–40)
Albumin/Globulin Ratio: 1.5 (ref 1.2–2.2)
Albumin: 4.8 g/dL (ref 3.8–4.8)
Alkaline Phosphatase: 75 IU/L (ref 39–117)
Anion Gap: 16 mmol/L (ref 10.0–18.0)
BUN/Creatinine Ratio: 17 (ref 10–24)
BUN: 27 mg/dL (ref 8–27)
Bilirubin Total: 0.7 mg/dL (ref 0.0–1.2)
CO2: 25 mmol/L (ref 20–29)
Calcium: 9.7 mg/dL (ref 8.6–10.2)
Chloride: 98 mmol/L (ref 96–106)
Creatinine, Ser: 1.57 mg/dL — ABNORMAL HIGH (ref 0.76–1.27)
GFR calc Af Amer: 51 mL/min/{1.73_m2} — ABNORMAL LOW (ref >59)
GFR calc non Af Amer: 44 mL/min/{1.73_m2} — ABNORMAL LOW (ref >59)
Globulin, Total: 3.3 g/dL (ref 1.5–4.5)
Glucose: 104 mg/dL — ABNORMAL HIGH (ref 65–99)
Potassium: 3.9 mmol/L (ref 3.5–5.2)
Sodium: 139 mmol/L (ref 134–144)
Total Protein: 8.1 g/dL (ref 6.0–8.5)

## 2019-03-29 LAB — CBC
Hematocrit: 39.4 % (ref 37.5–51.0)
Hemoglobin: 12.9 g/dL — ABNORMAL LOW (ref 13.0–17.7)
MCH: 30.5 pg (ref 26.6–33.0)
MCHC: 32.7 g/dL (ref 31.5–35.7)
MCV: 93 fL (ref 79–97)
Platelets: 159 10*3/uL (ref 150–450)
RBC: 4.23 x10E6/uL (ref 4.14–5.80)
RDW: 11.9 % (ref 11.6–15.4)
WBC: 4.6 10*3/uL (ref 3.4–10.8)

## 2019-03-29 NOTE — Patient Instructions (Addendum)
Andrew Schmitt , Thank you for taking time to come for your Medicare Wellness Visit. I appreciate your ongoing commitment to your health goals. Please review the following plan we discussed and let me know if I can assist you in the future.   Screening recommendations/referrals: Colonoscopy: 12/2015 Recommended yearly ophthalmology/optometry visit for glaucoma screening and checkup Recommended yearly dental visit for hygiene and checkup  Vaccinations: Influenza vaccine: declines Pneumococcal vaccine: 06/2016 Tdap vaccine: 07/2011 Shingles vaccine: discussed    Advanced directives: Please bring a copy of your POA (Power of [place]) and/or Living Will to your next appointment.    Conditions/risks identified: obesity  Next appointment: 04/03/2020 at 9:30  Preventive Care 76 Years and Older, Male Preventive care refers to lifestyle choices and visits with your health care provider that can promote health and wellness. What does preventive care include?  A yearly physical exam. This is also called an annual well check.  Dental exams once or twice a year.  Routine eye exams. Ask your health care provider how often you should have your eyes checked.  Personal lifestyle choices, including:  Daily care of your teeth and gums.  Regular physical activity.  Eating a healthy diet.  Avoiding tobacco and drug use.  Limiting alcohol use.  Practicing safe sex.  Taking low doses of aspirin every day.  Taking vitamin and mineral supplements as recommended by your health care provider. What happens during an annual well check? The services and screenings done by your health care provider during your annual well check will depend on your age, overall health, lifestyle risk factors, and family history of disease. Counseling  Your health care provider may ask you questions about your:  Alcohol use.  Tobacco use.  Drug use.  Emotional well-being.  Home and relationship well-being.   Sexual activity.  Eating habits.  History of falls.  Memory and ability to understand (cognition).  Work and work Statistician. Screening  You may have the following tests or measurements:  Height, weight, and BMI.  Blood pressure.  Lipid and cholesterol levels. These may be checked every 5 years, or more frequently if you are over 53 years old.  Skin check.  Lung cancer screening. You may have this screening every year starting at age 55 if you have a 30-pack-year history of smoking and currently smoke or have quit within the past 15 years.  Fecal occult blood test (FOBT) of the stool. You may have this test every year starting at age 63.  Flexible sigmoidoscopy or colonoscopy. You may have a sigmoidoscopy every 5 years or a colonoscopy every 10 years starting at age 77.  Prostate cancer screening. Recommendations will vary depending on your family history and other risks.  Hepatitis C blood test.  Hepatitis B blood test.  Sexually transmitted disease (STD) testing.  Diabetes screening. This is done by checking your blood sugar (glucose) after you have not eaten for a while (fasting). You may have this done every 1-3 years.  Abdominal aortic aneurysm (AAA) screening. You may need this if you are a current or former smoker.  Osteoporosis. You may be screened starting at age 84 if you are at high risk. Talk with your health care provider about your test results, treatment options, and if necessary, the need for more tests. Vaccines  Your health care provider may recommend certain vaccines, such as:  Influenza vaccine. This is recommended every year.  Tetanus, diphtheria, and acellular pertussis (Tdap, Td) vaccine. You may need a Td booster every  10 years.  Zoster vaccine. You may need this after age 5.  Pneumococcal 13-valent conjugate (PCV13) vaccine. One dose is recommended after age 12.  Pneumococcal polysaccharide (PPSV23) vaccine. One dose is recommended after  age 31. Talk to your health care provider about which screenings and vaccines you need and how often you need them. This information is not intended to replace advice given to you by your health care provider. Make sure you discuss any questions you have with your health care provider. Document Released: 08/29/2015 Document Revised: 04/21/2016 Document Reviewed: 06/03/2015 Elsevier Interactive Patient Education  2017 [employer] Prevention in the Home Falls can cause injuries. They can happen to people of all ages. There are many things you can do to make your home safe and to help prevent falls. What can I do on the outside of my home?  Regularly fix the edges of walkways and driveways and fix any cracks.  Remove anything that might make you trip as you walk through a door, such as a raised step or threshold.  Trim any bushes or trees on the path to your home.  Use bright outdoor lighting.  Clear any walking paths of anything that might make someone trip, such as rocks or tools.  Regularly check to see if handrails are loose or broken. Make sure that both sides of any steps have handrails.  Any raised decks and porches should have guardrails on the edges.  Have any leaves, snow, or ice cleared regularly.  Use sand or salt on walking paths during winter.  Clean up any spills in your garage right away. This includes oil or grease spills. What can I do in the bathroom?  Use night lights.  Install grab bars by the toilet and in the tub and shower. Do not use towel bars as grab bars.  Use non-skid mats or decals in the tub or shower.  If you need to sit down in the shower, use a plastic, non-slip stool.  Keep the floor dry. Clean up any water that spills on the floor as soon as it happens.  Remove soap buildup in the tub or shower regularly.  Attach bath mats securely with double-sided non-slip rug tape.  Do not have throw rugs and other things on the floor that can  make you trip. What can I do in the bedroom?  Use night lights.  Make sure that you have a light by your bed that is easy to reach.  Do not use any sheets or blankets that are too big for your bed. They should not hang down onto the floor.  Have a firm chair that has side arms. You can use this for support while you get dressed.  Do not have throw rugs and other things on the floor that can make you trip. What can I do in the kitchen?  Clean up any spills right away.  Avoid walking on wet floors.  Keep items that you use a lot in easy-to-reach places.  If you need to reach something above you, use a strong step stool that has a grab bar.  Keep electrical cords out of the way.  Do not use floor polish or wax that makes floors slippery. If you must use wax, use non-skid floor wax.  Do not have throw rugs and other things on the floor that can make you trip. What can I do with my stairs?  Do not leave any items on the stairs.  Make sure  that there are handrails on both sides of the stairs and use them. Fix handrails that are broken or loose. Make sure that handrails are as long as the stairways.  Check any carpeting to make sure that it is firmly attached to the stairs. Fix any carpet that is loose or worn.  Avoid having throw rugs at the top or bottom of the stairs. If you do have throw rugs, attach them to the floor with carpet tape.  Make sure that you have a light switch at the top of the stairs and the bottom of the stairs. If you do not have them, ask someone to add them for you. What else can I do to help prevent falls?  Wear shoes that:  Do not have high heels.  Have rubber bottoms.  Are comfortable and fit you well.  Are closed at the toe. Do not wear sandals.  If you use a stepladder:  Make sure that it is fully opened. Do not climb a closed stepladder.  Make sure that both sides of the stepladder are locked into place.  Ask someone to hold it for you,  if possible.  Clearly mark and make sure that you can see:  Any grab bars or handrails.  First and last steps.  Where the edge of each step is.  Use tools that help you move around (mobility aids) if they are needed. These include:  Canes.  Walkers.  Scooters.  Crutches.  Turn on the lights when you go into a dark area. Replace any light bulbs as soon as they burn out.  Set up your furniture so you have a clear path. Avoid moving your furniture around.  If any of your floors are uneven, fix them.  If there are any pets around you, be aware of where they are.  Review your medicines with your doctor. Some medicines can make you feel dizzy. This can increase your chance of falling. Ask your doctor what other things that you can do to help prevent falls. This information is not intended to replace advice given to you by your health care provider. Make sure you discuss any questions you have with your health care provider. Document Released: 05/29/2009 Document Revised: 01/08/2016 Document Reviewed: 09/06/2014 Elsevier Interactive Patient Education  2017 [employer].

## 2019-03-29 NOTE — Patient Instructions (Signed)
Preventive Care 29 Years and Older, Male Preventive care refers to lifestyle choices and visits with your health care provider that can promote health and wellness. This includes:  A yearly physical exam. This is also called an annual well check.  Regular dental and eye exams.  Immunizations.  Screening for certain conditions.  Healthy lifestyle choices, such as diet and exercise. What can I expect for my preventive care visit? Physical exam Your health care provider will check:  Height and weight. These may be used to calculate body mass index (BMI), which is a measurement that tells if you are at a healthy weight.  Heart rate and blood pressure.  Your skin for abnormal spots. Counseling Your health care provider may ask you questions about:  Alcohol, tobacco, and drug use.  Emotional well-being.  Home and relationship well-being.  Sexual activity.  Eating habits.  History of falls.  Memory and ability to understand (cognition).  Work and work Statistician. What immunizations do I need?  Influenza (flu) vaccine  This is recommended every year. Tetanus, diphtheria, and pertussis (Tdap) vaccine  You may need a Td booster every 10 years. Varicella (chickenpox) vaccine  You may need this vaccine if you have not already been vaccinated. Zoster (shingles) vaccine  You may need this after age 84. Pneumococcal conjugate (PCV13) vaccine  One dose is recommended after age 51. Pneumococcal polysaccharide (PPSV23) vaccine  One dose is recommended after age 53. Measles, mumps, and rubella (MMR) vaccine  You may need at least one dose of MMR if you were born in 1957 or later. You may also need a second dose. Meningococcal conjugate (MenACWY) vaccine  You may need this if you have certain conditions. Hepatitis A vaccine  You may need this if you have certain conditions or if you travel or work in places where you may be exposed to hepatitis A. Hepatitis B vaccine   You may need this if you have certain conditions or if you travel or work in places where you may be exposed to hepatitis B. Haemophilus influenzae type b (Hib) vaccine  You may need this if you have certain conditions. You may receive vaccines as individual doses or as more than one vaccine together in one shot (combination vaccines). Talk with your health care provider about the risks and benefits of combination vaccines. What tests do I need? Blood tests  Lipid and cholesterol levels. These may be checked every 5 years, or more frequently depending on your overall health.  Hepatitis C test.  Hepatitis B test. Screening  Lung cancer screening. You may have this screening every year starting at age 41 if you have a 30-pack-year history of smoking and currently smoke or have quit within the past 15 years.  Colorectal cancer screening. All adults should have this screening starting at age 59 and continuing until age 28. Your health care provider may recommend screening at age 56 if you are at increased risk. You will have tests every 1-10 years, depending on your results and the type of screening test.  Prostate cancer screening. Recommendations will vary depending on your family history and other risks.  Diabetes screening. This is done by checking your blood sugar (glucose) after you have not eaten for a while (fasting). You may have this done every 1-3 years.  Abdominal aortic aneurysm (AAA) screening. You may need this if you are a current or former smoker.  Sexually transmitted disease (STD) testing. Follow these instructions at home: Eating and drinking  Eat  a diet that includes fresh fruits and vegetables, whole grains, lean protein, and low-fat dairy products. Limit your intake of foods with high amounts of sugar, saturated fats, and salt.  Take vitamin and mineral supplements as recommended by your health care provider.  Do not drink alcohol if your health care provider  tells you not to drink.  If you drink alcohol: ? Limit how much you have to 0-2 drinks a day. ? Be aware of how much alcohol is in your drink. In the [place] one drink equals one 12 oz bottle of beer (355 mL), one 5 oz glass of wine (148 mL), or one 1 oz glass of hard liquor (44 mL). Lifestyle  Take daily care of your teeth and gums.  Stay active. Exercise for at least 30 minutes on 5 or more days each week.  Do not use any products that contain nicotine or tobacco, such as cigarettes, e-cigarettes, and chewing tobacco. If you need help quitting, ask your health care provider.  If you are sexually active, practice safe sex. Use a condom or other form of protection to prevent STIs (sexually transmitted infections).  Talk with your health care provider about taking a low-dose aspirin or statin. What's next?  Visit your health care provider once a year for a well check visit.  Ask your health care provider how often you should have your eyes and teeth checked.  Stay up to date on all vaccines. This information is not intended to replace advice given to you by your health care provider. Make sure you discuss any questions you have with your health care provider. Document Released: 08/29/2015 Document Revised: 07/27/2018 Document Reviewed: 07/27/2018 Elsevier Patient Education  2020 [employer].

## 2019-03-29 NOTE — Progress Notes (Signed)
Subjective:     Patient ID: Andrew Schmitt , male    DOB: [birth year] , 67 y.o.   MRN: [id number]   Chief Complaint  Patient presents with  . Annual Exam    HPI  Pt is here for medicare wellness visit and annual physical. Has seen his cardiologist this month and every thing is stable. He denies any complaints today. He walks 10-15  in the house a couple of times a week, but does not go out to walk.  Past Medical History:  Diagnosis Date  . Gout   . Hypertension      No family history on file.   Current Outpatient Medications:  .  alfuzosin (UROXATRAL) 10 MG 24 hr tablet, Take 10 mg by mouth daily with breakfast., Disp: , Rfl:  .  allopurinol (ZYLOPRIM) 300 MG tablet, TAKE 1 TABLET BY MOUTH EVERY DAY, Disp: 90 tablet, Rfl: 1 .  amLODipine (NORVASC) 10 MG tablet, Take 10 mg by mouth daily., Disp: , Rfl:  .  aspirin EC 81 MG tablet, Take 81 mg by mouth daily., Disp: , Rfl:  .  carvedilol (COREG) 6.25 MG tablet, TAKE ONE TABLET BY MOUTH TWICE A DAY WITH FOOD, Disp: 180 tablet, Rfl: 0 .  chlorthalidone (HYGROTON) 50 MG tablet, Take 50 mg by mouth daily., Disp: , Rfl:  .  cholecalciferol (VITAMIN D) 1000 units tablet, Take 1,000 Units by mouth daily., Disp: , Rfl:  .  docusate sodium (COLACE) 100 MG capsule, Take 100 mg by mouth 2 (two) times daily., Disp: , Rfl:  .  finasteride (PROSCAR) 5 MG tablet, Take 5 mg by mouth daily., Disp: , Rfl:  .  furosemide (LASIX) 20 MG tablet, TAKE 1 TABLET BY MOUTH DAILY, Disp: 90 tablet, Rfl: 1 .  Garlic 10 MG CAPS, Take by mouth., Disp: , Rfl:  .  hydrALAZINE (APRESOLINE) 100 MG tablet, Take 1 tablet (100 mg total) by mouth 3 (three) times daily., Disp: 90 tablet, Rfl: 3 .  ibuprofen (ADVIL) 200 MG tablet, Take 200 mg by mouth every 6 (six) hours as needed., Disp: , Rfl:  .  linaclotide (LINZESS) 145 MCG CAPS capsule, Take 145 mcg by mouth daily before breakfast., Disp: , Rfl:  .  rosuvastatin (CRESTOR) 10 MG tablet, TAKE 1 TABLET BY MOUTH EVERY  DAY, Disp: 90 tablet, Rfl: 1 .  sildenafil (REVATIO) 20 MG tablet, Take 1 tablet (20 mg total) by mouth daily as needed., Disp: 30 tablet, Rfl: 3 .  tamsulosin (FLOMAX) 0.4 MG CAPS capsule, Take 0.4 mg by mouth daily., Disp: , Rfl:  .  vitamin B-12 (CYANOCOBALAMIN) 500 MCG tablet, Take 500 mcg by mouth daily., Disp: , Rfl:    No Known Allergies   Review of Systems  Mild edema on ankles and feet, otherwise neg.  Today's Vitals   03/29/19 1009  BP: (!) 156/78  Pulse: 78  Temp: 98.9 F (37.2 C)  TempSrc: Oral  Weight: 274 lb 3.2 oz (124.4 kg)  Height: 5\' 7"  (1.702 m)  PainSc: 0-No pain   Body mass index is 42.95 kg/m.   Objective:  Physical Exam his wt is 1 lb down since 1 month Repeated BP 130/70 BP 130/70   Pulse 78   Temp 98.9 F (37.2 C) (Oral)   Ht 5\' 7"  (1.702 m)   Wt 274 lb 3.2 oz (124.4 kg)   BMI 42.95 kg/m   General Appearance:    Alert, cooperative, no distress, appears stated age  Head:  Normocephalic, without obvious abnormality, atraumatic  Eyes:    PERRL, conjunctiva/corneas clear, EOM's intact, fundi    benign, both eyes       Ears:    Normal TM's and external ear canals, both ears  Nose:   Nares normal, septum midline, mucosa normal, no drainage   or sinus tenderness  Throat:   Lips, mucosa, and tongue normal; teeth and gums normal  Neck:   Supple, symmetrical, trachea midline, no adenopathy;       thyroid:  No enlargement/tenderness/nodules; no carotid   bruit   Back:     Symmetric, no curvature, ROM normal, no CVA tenderness  Lungs:     Clear to auscultation bilaterally, respirations unlabored  Chest wall:    No tenderness or deformity  Heart:    Regular rate and rhythm, S1 and S2 normal, no murmur, rub   or gallop  Abdomen:     Soft, non-tender, bowel sounds active all four quadrants,    no masses, no organomegaly  Genitalia:    Normal male without lesion, discharge or tenderness  Rectal:    Normal tone, normal prostate, no masses or  tenderness;   guaiac negative stool  Extremities:   Extremities normal, atraumatic, no cyanosis or edema  Pulses:   2+ and symmetric all extremities  Skin:   Skin color, texture, turgor normal, no rashes or lesions  Lymph nodes:   Cervical, supraclavicular, and axillary nodes normal  Neurologic:   CNII-XII intact. Normal strength, sensation and reflexes      Throughout. Has slight trouble maintaining his balance with tandem gait, but had normal tip toe and heel gait. Normal Romberg.    Assessment And Plan:    1. Screening for prostate cancer- screen  2. Screen for colon cancer- screen    Neg hemoccult  3. Hypertension with heart disease- not optimal control today. May continue same meds and continue FU with his cardiologist.  - CMP14 + Anion Gap - CBC no Diff  4. Pure hypercholesterolemia- chronic. May continue same meds  5. Morbid obesity ([place])- chronic. Advised to increase his physical activity.   6. Encounter for general adult medical examination with abnormal findings- routine. FU 1y  7. Edema, unspecified type- advised to avoid sleeping on a recliner and to elevate legs above heart. May also get knee high support stockings to help   Andrew Schnieders RODRIGUEZ-SOUTHWORTH, PA-C    THE PATIENT IS ENCOURAGED TO PRACTICE SOCIAL DISTANCING DUE TO THE COVID-19 PANDEMIC.

## 2019-03-29 NOTE — Progress Notes (Signed)
Subjective:   Andrew Schmitt is a 68 y.o. male who presents for an Initial Medicare Annual Wellness Visit.  Review of Systems  n/a Cardiac Risk Factors include: advanced age (>60men, >52 women);hypertension;male gender;obesity (BMI >30kg/m2);sedentary lifestyle    Objective:    Today's Vitals   03/29/19 0947  BP: (!) 156/78  Pulse: 78  Temp: 98.9 F (37.2 C)  TempSrc: Oral  SpO2: 97%  Weight: 274 lb 3.2 oz (124.4 kg)  Height: 5\' 7"  (1.702 m)   Body mass index is 42.95 kg/m.  Advanced Directives 03/29/2019 11/02/2016  Does Patient Have a Medical Advance Directive? Yes No  Type of Paramedic of [place];Living will -  Copy of [place] in Chart? No - copy requested -  Would patient like information on creating a medical advance directive? - No - Patient declined    Current Medications (verified) Outpatient Encounter Medications as of 03/29/2019  Medication Sig  . alfuzosin (UROXATRAL) 10 MG 24 hr tablet Take 10 mg by mouth daily with breakfast.  . allopurinol (ZYLOPRIM) 300 MG tablet TAKE 1 TABLET BY MOUTH EVERY DAY  . amLODipine (NORVASC) 10 MG tablet Take 10 mg by mouth daily.  Marland Kitchen aspirin EC 81 MG tablet Take 81 mg by mouth daily.  . carvedilol (COREG) 6.25 MG tablet TAKE ONE TABLET BY MOUTH TWICE A DAY WITH FOOD  . chlorthalidone (HYGROTON) 50 MG tablet Take 50 mg by mouth daily.  . cholecalciferol (VITAMIN D) 1000 units tablet Take 1,000 Units by mouth daily.  Marland Kitchen docusate sodium (COLACE) 100 MG capsule Take 100 mg by mouth 2 (two) times daily.  . finasteride (PROSCAR) 5 MG tablet Take 5 mg by mouth daily.  . furosemide (LASIX) 20 MG tablet TAKE 1 TABLET BY MOUTH DAILY  . Garlic 10 MG CAPS Take by mouth.  . hydrALAZINE (APRESOLINE) 100 MG tablet Take 1 tablet (100 mg total) by mouth 3 (three) times daily.  Marland Kitchen ibuprofen (ADVIL) 200 MG tablet Take 200 mg by mouth every 6 (six) hours as needed.  . linaclotide (LINZESS) 145 MCG  CAPS capsule Take 145 mcg by mouth daily before breakfast.  . rosuvastatin (CRESTOR) 10 MG tablet TAKE 1 TABLET BY MOUTH EVERY DAY  . sildenafil (REVATIO) 20 MG tablet Take 1 tablet (20 mg total) by mouth daily as needed.  . tamsulosin (FLOMAX) 0.4 MG CAPS capsule Take 0.4 mg by mouth daily.  . vitamin B-12 (CYANOCOBALAMIN) 500 MCG tablet Take 500 mcg by mouth daily.   No facility-administered encounter medications on file as of 03/29/2019.     Allergies (verified) Patient has no known allergies.   History: Past Medical History:  Diagnosis Date  . Gout   . Hypertension    Past Surgical History:  Procedure Laterality Date  . CARDIAC SURGERY     History reviewed. No pertinent family history. Social History   Socioeconomic History  . Marital status: Married    Spouse name: Not on file  . Number of children: Not on file  . Years of education: Not on file  . Highest education level: Not on file  Occupational History  . Occupation: retired  Scientific laboratory technician  . Financial resource strain: Not hard at all  . Food insecurity    Worry: Never true    Inability: Never true  . Transportation needs    Medical: No    Non-medical: No  Tobacco Use  . Smoking status: Never Smoker  . Smokeless tobacco:  Never Used  Substance and Sexual Activity  . Alcohol use: No  . Drug use: No  . Sexual activity: Yes  Lifestyle  . Physical activity    Days per week: 0 days    Minutes per session: 0 min  . Stress: Not at all  Relationships  . Social Herbalist on phone: Not on file    Gets together: Not on file    Attends religious service: Not on file    Active member of club or organization: Not on file    Attends meetings of clubs or organizations: Not on file    Relationship status: Not on file  Other Topics Concern  . Not on file  Social History Narrative  . Not on file   Tobacco Counseling Counseling given: Not Answered   Clinical Intake:  Pre-visit preparation  completed: Yes  Pain : No/denies pain     Nutritional Status: BMI > 30  Obese Nutritional Risks: None Diabetes: No  How often do you need to have someone help you when you read instructions, pamphlets, or other written materials from your doctor or pharmacy?: 1 - Never What is the last grade level you completed in school?: Master's degree  Interpreter Needed?: No  Information entered by :: NAllen LPN  Activities of Daily Living In your present state of health, do you have any difficulty performing the following activities: 03/29/2019  Hearing? N  Vision? N  Difficulty concentrating or making decisions? N  Walking or climbing stairs? N  Dressing or bathing? N  Doing errands, shopping? N  Preparing Food and eating ? N  Using the Toilet? N  In the past six months, have you accidently leaked urine? N  Do you have problems with loss of bowel control? N  Managing your Medications? N  Managing your Finances? N  Housekeeping or managing your Housekeeping? N  Some recent data might be hidden     Immunizations and Health Maintenance  There is no immunization history on file for this patient. There are no preventive care reminders to display for this patient.  Patient Care Team: Glendale Chard, MD as PCP - General (Internal Medicine)  Indicate any recent Medical Services you may have received from other than [facility] providers in the past year (date may be approximate).    Assessment:   This is a routine wellness examination for [facility].  Hearing/Vision screen  Hearing Screening   125Hz  250Hz  500Hz  1000Hz  2000Hz  3000Hz  4000Hz  6000Hz  8000Hz   Right ear:           Left ear:           Vision Screening Comments: No annual eye exams  Dietary issues and exercise activities discussed: Current Exercise Habits: The patient does not participate in regular exercise at present  Goals    . Weight (lb) < 200 lb (90.7 kg)      Depression Screen PHQ 2/9 Scores 03/29/2019 01/18/2019  PHQ  - 2 Score 0 0  PHQ- 9 Score 0 -    Fall Risk Fall Risk  03/29/2019 01/18/2019  Falls in the past year? 0 0  Risk for fall due to : Medication side effect -  Follow up Falls evaluation completed;Education provided;Falls prevention discussed -    Is the patient's home free of loose throw rugs in walkways, pet beds, electrical cords, etc?   yes      Grab bars in the bathroom? no      Handrails on the stairs?  yes      Adequate lighting?   yes  Timed Get Up and Go performed: n/a  Cognitive Function:     6CIT Screen 03/29/2019  What Year? 0 points  What month? 0 points  What time? 0 points  Count back from 20 0 points  Months in reverse 2 points  Repeat phrase 0 points  Total Score 2    Screening Tests Health Maintenance  Topic Date Due  . INFLUENZA VACCINE  11/14/2019 (Originally 03/17/2019)  . TETANUS/TDAP  08/05/2021  . COLONOSCOPY  01/01/2026  . Hepatitis C Screening  Completed  . PNA vac Low Risk Adult  Completed    Qualifies for Shingles Vaccine? yes  Cancer Screenings: Lung: Low Dose CT Chest recommended if Age 48-80 years, 30 pack-year currently smoking OR have quit w/in 15years. Patient does not qualify. Colorectal: up to date  Additional Screenings:  Hepatitis C Screening: 11/16/2012      Plan:    Patient wants to lose weight, but does not have a goal set.  I have personally reviewed and noted the following in the patient's chart:   . Medical and social history . Use of alcohol, tobacco or illicit drugs  . Current medications and supplements . Functional ability and status . Nutritional status . Physical activity . Advanced directives . List of other physicians . Hospitalizations, surgeries, and ER visits in previous 12 months . Vitals . Screenings to include cognitive, depression, and falls . Referrals and appointments  In addition, I have reviewed and discussed with patient certain preventive protocols, quality metrics, and best practice  recommendations. A written personalized care plan for preventive services as well as general preventive health recommendations were provided to patient.     Kellie Simmering, LPN   [id number]

## 2019-04-03 ENCOUNTER — Other Ambulatory Visit: Payer: Self-pay | Admitting: Internal Medicine

## 2019-04-05 ENCOUNTER — Encounter: Payer: Self-pay | Admitting: Internal Medicine

## 2019-04-05 DIAGNOSIS — N183 Chronic kidney disease, stage 3 unspecified: Secondary | ICD-10-CM

## 2019-04-05 DIAGNOSIS — N184 Chronic kidney disease, stage 4 (severe): Secondary | ICD-10-CM

## 2019-04-05 HISTORY — DX: Chronic kidney disease, stage 3 unspecified: N18.30

## 2019-04-05 HISTORY — DX: Chronic kidney disease, stage 4 (severe): N18.4

## 2019-04-12 ENCOUNTER — Encounter: Payer: Self-pay | Admitting: Internal Medicine

## 2019-04-12 ENCOUNTER — Other Ambulatory Visit: Payer: Self-pay | Admitting: Internal Medicine

## 2019-04-12 DIAGNOSIS — N183 Chronic kidney disease, stage 3 unspecified: Secondary | ICD-10-CM

## 2019-04-12 NOTE — Assessment & Plan Note (Signed)
Sees nephrology

## 2019-04-12 NOTE — Progress Notes (Signed)
Updated medical history.

## 2019-05-27 ENCOUNTER — Other Ambulatory Visit: Payer: Self-pay | Admitting: Internal Medicine

## 2019-07-21 ENCOUNTER — Other Ambulatory Visit: Payer: Self-pay | Admitting: Internal Medicine

## 2019-08-21 ENCOUNTER — Other Ambulatory Visit: Payer: Self-pay | Admitting: Internal Medicine

## 2019-08-28 ENCOUNTER — Ambulatory Visit (INDEPENDENT_AMBULATORY_CARE_PROVIDER_SITE_OTHER): Payer: Medicare Other

## 2019-08-28 ENCOUNTER — Other Ambulatory Visit: Payer: Self-pay

## 2019-08-28 DIAGNOSIS — I119 Hypertensive heart disease without heart failure: Secondary | ICD-10-CM | POA: Diagnosis not present

## 2019-09-04 ENCOUNTER — Encounter: Payer: Self-pay | Admitting: Internal Medicine

## 2019-09-05 ENCOUNTER — Encounter: Payer: Self-pay | Admitting: Internal Medicine

## 2019-09-08 ENCOUNTER — Other Ambulatory Visit: Payer: Self-pay | Admitting: Student

## 2019-09-11 ENCOUNTER — Ambulatory Visit: Payer: Medicare Other | Admitting: Cardiology

## 2019-09-12 ENCOUNTER — Telehealth: Payer: Self-pay | Admitting: Cardiology

## 2019-09-12 ENCOUNTER — Ambulatory Visit: Payer: Medicare Other | Admitting: Cardiology

## 2019-09-12 ENCOUNTER — Other Ambulatory Visit: Payer: Self-pay

## 2019-09-12 ENCOUNTER — Encounter: Payer: Self-pay | Admitting: Cardiology

## 2019-09-12 VITALS — BP 155/87 | HR 67 | Ht 66.0 in | Wt 275.0 lb

## 2019-09-12 DIAGNOSIS — I119 Hypertensive heart disease without heart failure: Secondary | ICD-10-CM

## 2019-09-12 DIAGNOSIS — I10 Essential (primary) hypertension: Secondary | ICD-10-CM

## 2019-09-12 DIAGNOSIS — E782 Mixed hyperlipidemia: Secondary | ICD-10-CM | POA: Diagnosis not present

## 2019-09-12 NOTE — Progress Notes (Signed)
Primary Physician:  Glendale Chard, MD   Patient ID: Andrew Schmitt, male    DOB: Apr 27, 1951, 69 y.o.   MRN: [id number]  Subjective:    Chief Complaint  Patient presents with  . Hypertension    HPI: EARLY KONDOR  is a 69 y.o. male  with hypertensive heart disease and ischemic heart disease. He states that dyspnea on exertion has been stable. Patient denies complaints of chest pain, tightness or pressure. No palpitation, dizziness, near-syncope or syncope. He has chronic swelling on the legs that is unchanged. No complaints of leg claudication.He presents today to discuss recent echocardiogram results.  Patient has hypertension and hypercholesterolemia. No history of diabetes. He does not smoke.  Patient has chronic kidney disease stage 3b. He also has history of gout, GERD, mild anemia, BPH and erectile dysfunction. Patient has morbid obesity.   No history of thyroid problems. No history of TIA or CVA.  Past Medical History:  Diagnosis Date  . CKD (chronic kidney disease) stage 3, GFR 30-59 ml/min 04/05/2019  . Gout   . Hypertension     Past Surgical History:  Procedure Laterality Date  . CARDIAC SURGERY      Social History   Socioeconomic History  . Marital status: Married    Spouse name: Not on file  . Number of children: 4  . Years of education: Not on file  . Highest education level: Not on file  Occupational History  . Occupation: retired  Tobacco Use  . Smoking status: Never Smoker  . Smokeless tobacco: Never Used  Substance and Sexual Activity  . Alcohol use: No  . Drug use: No  . Sexual activity: Yes  Other Topics Concern  . Not on file  Social History Narrative  . Not on file   Social Determinants of Health   Financial Resource Strain: Low Risk   . Difficulty of Paying Living Expenses: Not hard at all  Food Insecurity: No Food Insecurity  . Worried About Charity fundraiser in the Last Year: Never true  . Ran Out of Food in the Last  Year: Never true  Transportation Needs: No Transportation Needs  . Lack of Transportation (Medical): No  . Lack of Transportation (Non-Medical): No  Physical Activity: Inactive  . Days of Exercise per Week: 0 days  . Minutes of Exercise per Session: 0 min  Stress: No Stress Concern Present  . Feeling of Stress : Not at all  Social Connections:   . Frequency of Communication with Friends and Family: Not on file  . Frequency of Social Gatherings with Friends and Family: Not on file  . Attends Religious Services: Not on file  . Active Member of Clubs or Organizations: Not on file  . Attends Archivist Meetings: Not on file  . Marital Status: Not on file  Intimate Partner Violence: Not At Risk  . Fear of Current or Ex-Partner: No  . Emotionally Abused: No  . Physically Abused: No  . Sexually Abused: No    Review of Systems  Constitution: Negative for decreased appetite, malaise/fatigue, weight gain and weight loss.  Eyes: Negative for visual disturbance.  Cardiovascular: Negative for chest pain, claudication, dyspnea on exertion, leg swelling, orthopnea, palpitations and syncope.  Respiratory: Negative for hemoptysis and wheezing.   Endocrine: Negative for cold intolerance and heat intolerance.  Hematologic/Lymphatic: Does not bruise/bleed easily.  Skin: Negative for nail changes.  Musculoskeletal: Negative for muscle weakness and myalgias.  Gastrointestinal: Negative for abdominal pain,  change in bowel habit, nausea and vomiting.  Neurological: Negative for difficulty with concentration, dizziness, focal weakness and headaches.  Psychiatric/Behavioral: Negative for altered mental status and suicidal ideas.  All other systems reviewed and are negative.     Objective:  Blood pressure (!) 155/87, pulse 67, height 5\' 6"  (1.676 m), weight 275 lb (124.7 kg), SpO2 98 %. Body mass index is 44.39 kg/m.    Physical Exam  Constitutional: He is oriented to person, place, and  time. Vital signs are normal. He appears well-developed and well-nourished.  Morbidly obese.  HENT:  Head: Normocephalic and atraumatic.  Eyes: Conjunctivae are normal.  Neck: No thyromegaly present.  Cardiovascular: Normal rate, regular rhythm, normal heart sounds and intact distal pulses. Exam reveals no gallop.  No murmur heard. Pulses:      Carotid pulses are 2+ on the right side and 2+ on the left side.      Dorsalis pedis pulses are 2+ on the right side and 2+ on the left side.  1+ pitting edema  Pulmonary/Chest: Effort normal and breath sounds normal. No accessory muscle usage. No respiratory distress. He has no wheezes. He has no rales.  Abdominal: Soft. Bowel sounds are normal. He exhibits no mass. There is no abdominal tenderness.  Musculoskeletal:        General: Edema (1+ edema on legs, feet) present. Normal range of motion.     Cervical back: Normal range of motion.  Lymphadenopathy:    He has no cervical adenopathy.  Neurological: He is alert and oriented to person, place, and time.  Skin: Skin is warm and dry.  Vitals reviewed.  Radiology: No results found.  Laboratory examination:    CMP Latest Ref Rng & Units 03/29/2019 01/18/2019  Glucose 65 - 99 mg/dL 104(H) 113(H)  BUN 8 - 27 mg/dL 27 32(H)  Creatinine 0.76 - 1.27 mg/dL 1.57(H) 1.57(H)  Sodium 134 - 144 mmol/L 139 138  Potassium 3.5 - 5.2 mmol/L 3.9 4.1  Chloride 96 - 106 mmol/L 98 100  CO2 20 - 29 mmol/L 25 25  Calcium 8.6 - 10.2 mg/dL 9.7 9.2  Total Protein 6.0 - 8.5 g/dL 8.1 7.3  Total Bilirubin 0.0 - 1.2 mg/dL 0.7 0.6  Alkaline Phos 39 - 117 IU/L 75 67  AST 0 - 40 IU/L 22 21  ALT 0 - 44 IU/L 18 18   CBC Latest Ref Rng & Units 03/29/2019 01/18/2019  WBC 3.4 - 10.8 x10E3/uL 4.6 4.5  Hemoglobin 13.0 - 17.7 g/dL 12.9(L) 12.7(L)  Hematocrit 37.5 - 51.0 % 39.4 38.9  Platelets 150 - 450 x10E3/uL 159 168   Lipid Panel     Component Value Date/Time   CHOL 145 01/18/2019 1619   TRIG 185 (H) 01/18/2019  1619   HDL 37 (L) 01/18/2019 1619   CHOLHDL 3.9 01/18/2019 1619   LDLCALC 71 01/18/2019 1619   HEMOGLOBIN A1C No results found for: HGBA1C, MPG TSH No results for input(s): TSH in the last 8760 hours.  PRN Meds:. There are no discontinued medications. Current Meds  Medication Sig  . alfuzosin (UROXATRAL) 10 MG 24 hr tablet Take 10 mg by mouth daily with breakfast.  . allopurinol (ZYLOPRIM) 300 MG tablet TAKE 1 TABLET BY MOUTH EVERY DAY  . amLODipine (NORVASC) 10 MG tablet TAKE 1 TABLET BY MOUTH EVERY DAY  . aspirin EC 81 MG tablet Take 81 mg by mouth daily.  . carvedilol (COREG) 6.25 MG tablet TAKE 1 TABLET BY MOUTH TWICE A DAY WITH  FOOD  . chlorthalidone (HYGROTON) 50 MG tablet Take 50 mg by mouth daily.  . cholecalciferol (VITAMIN D) 1000 units tablet Take 1,000 Units by mouth daily.  Marland Kitchen docusate sodium (COLACE) 100 MG capsule Take 100 mg by mouth 2 (two) times daily.  . finasteride (PROSCAR) 5 MG tablet Take 5 mg by mouth daily.  . furosemide (LASIX) 20 MG tablet TAKE 1 TABLET BY MOUTH DAILY  . Garlic 10 MG CAPS Take by mouth.  . hydrALAZINE (APRESOLINE) 100 MG tablet Take 1 tablet (100 mg total) by mouth 3 (three) times daily.  Marland Kitchen ibuprofen (ADVIL) 200 MG tablet Take 200 mg by mouth every 6 (six) hours as needed.  . linaclotide (LINZESS) 145 MCG CAPS capsule Take 145 mcg by mouth as needed.   . rosuvastatin (CRESTOR) 10 MG tablet TAKE 1 TABLET BY MOUTH EVERY DAY  . sildenafil (REVATIO) 20 MG tablet Take 1 tablet (20 mg total) by mouth daily as needed.  . tamsulosin (FLOMAX) 0.4 MG CAPS capsule Take 0.4 mg by mouth daily.  . vitamin B-12 (CYANOCOBALAMIN) 500 MCG tablet Take 500 mcg by mouth daily.    Cardiac Studies:   Echocardiogram 08/28/2019: Left ventricle cavity is normal in size. Severe concentric hypertrophy of the left ventricle. Normal global wall motion. Normal LV systolic function with visual EF 50-55%. Doppler evidence of grade I (impaired) diastolic dysfunction,  normal LAP.  Left atrial cavity is mildly dilated. Mild (Grade I) mitral regurgitation. Mild tricuspid regurgitation. No evidence of pulmonary hypertension. No significant change compared to previous study in 2017.  Lexiscan myoview stress test 05/07/2016: 1. The resting electrocardiogram demonstrated normal sinus rhythm, normal resting conduction and no resting arrhythmias. Stress EKG is non-diagnostic for ischemia as it a pharmacologic stress using Lexiscan. 2. The raw images reveal the left ventricle to be mildly dilated at 186 mL in both rest and stress images. Perfusion imaging reveals a small to moderate-sized inferior wall scar extending from the base towards the apex with no significant peri-infarct ischemia. In addition there is a very small sized severe ischemia in the apical lateral wall extending from the base towards the apex probably in the distribution of the diagonal branch of the LAD. Left ventricular systolic function was preserved with inferior wall hypokinesis, EF 59%. This is an intermediate risk study, clinical correlation recommended in a patient with BMI 45.  Assessment:   Hypertension with heart disease  Primary hypertension  Mixed hyperlipidemia  Morbid obesity ([place])   Recommendations:   I discussed recent echocardiogram results with the patient, no changes compared to his previous echocardiogram in 2017, except he has had slight improvement in diastolic dysfunction to now grade 1.  Would recommend continued aggressive blood pressure control.  His blood pressure does continue to be an issue and is elevated today.  He has stage III chronic kidney disease that is being followed by nephrology.  It appears that his kidney function has been stable over the last 1 year.  Question if he could potentially try at least low-dose ARB therapy, would like an opinion from them first.  We will send a copy of my office visit note to him as he is seeing him on Friday. 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   I reviewed his lipids that were performed in June 2020, well-controlled.  This will continue to be monitored by his PCP.  Encouraged him to continue to work on his weight to help with his risk  factors.  He has chronic leg edema that has been stable.  Encouraged him to continue to follow low-sodium diet.  I will see back in 6 months, but encouraged him to contact me sooner if needed.  Also discussed echocardiogram findings with patients daughter over the phone. Agreeable to plan  CC: Dr. Pearson Grippe, Dr. Bryon Lions  Miquel Dunn, MSN, APRN, [facility]. [place] Office: [phone] Fax: [phone]

## 2019-09-12 NOTE — Telephone Encounter (Signed)
Here is the daughters number [phone]

## 2019-10-04 ENCOUNTER — Ambulatory Visit: Payer: Medicare PPO | Admitting: Internal Medicine

## 2019-10-09 ENCOUNTER — Other Ambulatory Visit: Payer: Self-pay

## 2019-10-09 ENCOUNTER — Encounter: Payer: Self-pay | Admitting: Internal Medicine

## 2019-10-09 ENCOUNTER — Ambulatory Visit: Payer: Medicare PPO | Admitting: Internal Medicine

## 2019-10-09 VITALS — BP 112/76 | HR 66 | Temp 98.2°F | Ht 66.0 in | Wt 278.8 lb

## 2019-10-09 DIAGNOSIS — H6123 Impacted cerumen, bilateral: Secondary | ICD-10-CM | POA: Diagnosis not present

## 2019-10-09 DIAGNOSIS — M25642 Stiffness of left hand, not elsewhere classified: Secondary | ICD-10-CM

## 2019-10-09 DIAGNOSIS — I119 Hypertensive heart disease without heart failure: Secondary | ICD-10-CM

## 2019-10-09 DIAGNOSIS — M238X1 Other internal derangements of right knee: Secondary | ICD-10-CM

## 2019-10-09 DIAGNOSIS — Z6841 Body Mass Index (BMI) 40.0 and over, adult: Secondary | ICD-10-CM

## 2019-10-09 DIAGNOSIS — N183 Chronic kidney disease, stage 3 unspecified: Secondary | ICD-10-CM | POA: Diagnosis not present

## 2019-10-09 NOTE — Patient Instructions (Addendum)

## 2019-10-09 NOTE — Progress Notes (Signed)
This visit occurred during the SARS-CoV-2 public health emergency.  Safety protocols were in place, including screening questions prior to the visit, additional usage of staff PPE, and extensive cleaning of exam room while observing appropriate contact time as indicated for disinfecting solutions.  Subjective:     Patient ID: Andrew Schmitt , male    DOB: 08-Nov-1950 , 69 y.o.   MRN: [id number]   Chief Complaint  Patient presents with  . Hypertension    HPI  He is here today for bp check. He reports compliance with meds. He reports seeing nephrologist several weeks ago, he is on a new medication  - but he does not know the name of it.   Hypertension This is a chronic problem. The current episode started more than 1 year ago. The problem has been gradually improving since onset. Pertinent negatives include no blurred vision, chest pain, palpitations or shortness of breath. Risk factors for coronary artery disease include obesity, sedentary lifestyle, male gender and dyslipidemia. Past treatments include direct vasodilators.     Past Medical History:  Diagnosis Date  . CKD (chronic kidney disease) stage 3, GFR 30-59 ml/min 04/05/2019  . Gout   . Hypertension      Family History  Problem Relation Age of Onset  . Healthy Mother   . Healthy Father      Current Outpatient Medications:  .  alfuzosin (UROXATRAL) 10 MG 24 hr tablet, Take 10 mg by mouth daily with breakfast., Disp: , Rfl:  .  allopurinol (ZYLOPRIM) 300 MG tablet, TAKE 1 TABLET BY MOUTH EVERY DAY, Disp: 90 tablet, Rfl: 1 .  amLODipine (NORVASC) 10 MG tablet, TAKE 1 TABLET BY MOUTH EVERY DAY, Disp: 90 tablet, Rfl: 2 .  aspirin EC 81 MG tablet, Take 81 mg by mouth daily., Disp: , Rfl:  .  carvedilol (COREG) 6.25 MG tablet, TAKE 1 TABLET BY MOUTH TWICE A DAY WITH FOOD, Disp: 180 tablet, Rfl: 0 .  chlorthalidone (HYGROTON) 50 MG tablet, Take 50 mg by mouth daily., Disp: , Rfl:  .  cholecalciferol (VITAMIN D) 1000 units  tablet, Take 1,000 Units by mouth daily., Disp: , Rfl:  .  docusate sodium (COLACE) 100 MG capsule, Take 100 mg by mouth 2 (two) times daily., Disp: , Rfl:  .  finasteride (PROSCAR) 5 MG tablet, Take 5 mg by mouth daily., Disp: , Rfl:  .  furosemide (LASIX) 20 MG tablet, TAKE 1 TABLET BY MOUTH DAILY, Disp: 90 tablet, Rfl: 1 .  Garlic 10 MG CAPS, Take by mouth., Disp: , Rfl:  .  hydrALAZINE (APRESOLINE) 100 MG tablet, Take 1 tablet (100 mg total) by mouth 3 (three) times daily., Disp: 90 tablet, Rfl: 3 .  ibuprofen (ADVIL) 200 MG tablet, Take 200 mg by mouth every 6 (six) hours as needed., Disp: , Rfl:  .  linaclotide (LINZESS) 145 MCG CAPS capsule, Take 145 mcg by mouth as needed. , Disp: , Rfl:  .  rosuvastatin (CRESTOR) 10 MG tablet, TAKE 1 TABLET BY MOUTH EVERY DAY, Disp: 90 tablet, Rfl: 1 .  sildenafil (REVATIO) 20 MG tablet, Take 1 tablet (20 mg total) by mouth daily as needed., Disp: 30 tablet, Rfl: 3 .  tamsulosin (FLOMAX) 0.4 MG CAPS capsule, Take 0.4 mg by mouth daily., Disp: , Rfl:  .  vitamin B-12 (CYANOCOBALAMIN) 500 MCG tablet, Take 500 mcg by mouth daily., Disp: , Rfl:    No Known Allergies   Review of Systems  Constitutional: Negative.   HENT:  He c/o b/l tinnitus - started about 2-3 months ago. Intermittent. Unable to determine what triggers his sx. Usually occurs at rest. Denies hearing loss. Denies dizziness.   Eyes: Negative for blurred vision.  Respiratory: Negative.  Negative for shortness of breath.   Cardiovascular: Negative.  Negative for chest pain and palpitations.  Gastrointestinal: Negative.   Neurological: Negative.   Psychiatric/Behavioral: Negative.      Today's Vitals   10/09/19 1044  BP: 112/76  Pulse: 66  Temp: 98.2 F (36.8 C)  TempSrc: Oral  Weight: 278 lb 12.8 oz (126.5 kg)  Height: 5\' 6"  (1.676 m)  PainSc: 0-No pain   Body mass index is 45 kg/m.   Wt Readings from Last 3 Encounters:  10/09/19 278 lb 12.8 oz (126.5 kg)  09/12/19 275  lb (124.7 kg)  03/29/19 274 lb 3.2 oz (124.4 kg)     Objective:  Physical Exam Vitals and nursing note reviewed.  Constitutional:      Appearance: Normal appearance. He is obese.  HENT:     Right Ear: Ear canal and external ear normal. There is impacted cerumen.     Left Ear: Ear canal and external ear normal. There is impacted cerumen.     Ears:     Comments: Hard, impacted cerumen b/l Cardiovascular:     Rate and Rhythm: Normal rate and regular rhythm.     Heart sounds: Normal heart sounds.  Pulmonary:     Effort: Pulmonary effort is normal.     Breath sounds: Normal breath sounds.  Musculoskeletal:     Comments: Right knee crepitus Left hand squeeze test positive.   Skin:    General: Skin is warm.  Neurological:     General: No focal deficit present.     Mental Status: He is alert.  Psychiatric:        Mood and Affect: Mood normal.         Assessment And Plan:    1. Hypertension with heart disease  Chronic, well controlled. We do not have any recent notes from [facility], so I will request 2021 records today. He is encouraged to bring all records to every visit.  He will rto in six months for a full physical examination.   2. Stage 3 chronic kidney disease, unspecified whether stage 3a or 3b CKD  Chronic, also followed by Nephrology. He is encouraged to stay well hydrated.    3. Joint stiffness of hand, left  Squeeze test positive. I will check arthritis panel. He is encouraged to follow an anti-inflammatory diet, free of processed foods/sugary drinks. I will make further recommendations once his labs are available for review.   - ANA, IFA (with reflex) - CYCLIC CITRUL PEPTIDE ANTIBODY, IGG/IGA - Rheumatoid factor - Sedimentation rate - Uric acid  4. Bilateral impacted cerumen  AFTER OBTAINING VERBAL CONSENT, BOTH EARS WERE FLUSHED BY IRRIGATION. HE TOLERATED PROCEDURE WELL WITHOUT ANY COMPLICATIONS. NO TM ABNORMALITIES WERE NOTED.  - Ear  Lavage  5. Crepitus of joint of right knee  Pt denies having any pain. Pt advised that crepitus is caused by the rubbing of cartilage on the joint surface or other soft tissues around the knee during joint movement. Pt advised to let me know if he develops knee pain.   6. Class 3 severe obesity due to excess calories with serious comorbidity and body mass index (BMI) of 45.0 to 49.9 in adult (HCC)  BMI 45. He is encouraged to strive for BMI less than 40 (  initially) to decrease cardiac risk. He is encouraged to exercise 30 minutes five days weekly.     Maximino Greenland, MD    THE PATIENT IS ENCOURAGED TO PRACTICE SOCIAL DISTANCING DUE TO THE COVID-19 PANDEMIC.

## 2019-10-10 LAB — ANTINUCLEAR ANTIBODIES, IFA: ANA Titer 1: NEGATIVE

## 2019-10-10 LAB — CYCLIC CITRUL PEPTIDE ANTIBODY, IGG/IGA: Cyclic Citrullin Peptide Ab: 5 units (ref 0–19)

## 2019-10-10 LAB — URIC ACID: Uric Acid: 8.6 mg/dL — ABNORMAL HIGH (ref 3.8–8.4)

## 2019-10-10 LAB — SEDIMENTATION RATE: Sed Rate: 53 mm/hr — ABNORMAL HIGH (ref 0–30)

## 2019-10-10 LAB — RHEUMATOID FACTOR: Rheumatoid fact SerPl-aCnc: 10 IU/mL (ref 0.0–13.9)

## 2019-10-12 ENCOUNTER — Other Ambulatory Visit: Payer: Self-pay | Admitting: Internal Medicine

## 2019-10-27 ENCOUNTER — Other Ambulatory Visit: Payer: Self-pay | Admitting: Internal Medicine

## 2019-10-27 NOTE — Telephone Encounter (Signed)
Last 10/09/2019 Next 04/03/20

## 2019-11-18 ENCOUNTER — Other Ambulatory Visit: Payer: Self-pay | Admitting: Cardiology

## 2019-12-13 ENCOUNTER — Other Ambulatory Visit: Payer: Self-pay | Admitting: Cardiology

## 2020-01-05 ENCOUNTER — Other Ambulatory Visit: Payer: Self-pay | Admitting: Internal Medicine

## 2020-02-04 ENCOUNTER — Other Ambulatory Visit: Payer: Self-pay | Admitting: Internal Medicine

## 2020-02-09 ENCOUNTER — Other Ambulatory Visit: Payer: Self-pay | Admitting: Internal Medicine

## 2020-03-10 ENCOUNTER — Other Ambulatory Visit: Payer: Self-pay | Admitting: Cardiology

## 2020-03-11 ENCOUNTER — Ambulatory Visit: Payer: Medicare PPO | Admitting: Cardiology

## 2020-03-13 ENCOUNTER — Other Ambulatory Visit: Payer: Self-pay

## 2020-03-13 ENCOUNTER — Encounter: Payer: Self-pay | Admitting: Cardiology

## 2020-03-13 ENCOUNTER — Ambulatory Visit: Payer: Medicare PPO | Admitting: Cardiology

## 2020-03-13 VITALS — BP 148/78 | HR 68 | Resp 17 | Ht 66.0 in | Wt 278.0 lb

## 2020-03-13 DIAGNOSIS — N1832 Chronic kidney disease, stage 3b: Secondary | ICD-10-CM

## 2020-03-13 DIAGNOSIS — E782 Mixed hyperlipidemia: Secondary | ICD-10-CM

## 2020-03-13 DIAGNOSIS — I119 Hypertensive heart disease without heart failure: Secondary | ICD-10-CM

## 2020-03-13 DIAGNOSIS — R9431 Abnormal electrocardiogram [ECG] [EKG]: Secondary | ICD-10-CM

## 2020-03-13 NOTE — Progress Notes (Signed)
Andrew Schmitt Date of Birth: Jul 05, 1951 MRN: [id number] Primary Care Provider:Sanders, Bailey Mech, MD Former Cardiology Providers: Dr. Vear Clock, Jeri Lager, APRN, FNP-C Primary Cardiologist: Rex Kras, DO, [facility] (established care 03/13/2020)  Date: 03/13/20 Last Visit: 09/12/2019  Chief Complaint  Patient presents with  . Hypertension  . Follow-up    6 month    HPI  Andrew Schmitt is a 69 y.o.  male who presents to the office with a chief complaint of " blood pressure management." Patient's past medical history and cardiovascular risk factors include: Hypertension, hyperlipidemia, chronic kidney disease stage IIIb, gout, GERD, mild anemia, BPH, erectile dysfunction, advanced age, obesity due to excess calories.  Patient was last seen in the office back in January by Miquel Dunn for management of hypertension.  He is here to reestablish care with me for his 42-month follow-up visit.  Since last office visit patient denies any hospitalizations or urgent care visits for cardiovascular symptoms.  Patient states that his blood pressure is relatively well controlled.  When asked patient states that he does not check his blood pressures regularly at home.  At most he checks it 2 or 3 times per week.  Unfortunately, he states that his systolic blood pressures at home could range as high as 170-180 mmHg.  Patient understands that these numbers are not well controlled but for reasons unknown not contact the office for further medication titration.  At today's office visit patient's blood pressure is 148/78.  He also has underlying chronic kidney disease stage IIIb which she follows up with nephrology.  Medications reconciled.  I do not have recent blood work to review at today's encounter.  Patient states that he tries to follow a low-salt diet.  His overall functional status remains stable since last visit.  Denies prior history of congestive heart failure, deep venous thrombosis, pulmonary  embolism, stroke, transient ischemic attack.  FUNCTIONAL STATUS: Walking 10-15 minutes in the neighborhood 3-4x /week.    ALLERGIES: Allergies  Allergen Reactions  . Ace Inhibitors Cough   MEDICATION LIST PRIOR TO VISIT: Current Outpatient Medications on File Prior to Visit  Medication Sig Dispense Refill  . alfuzosin (UROXATRAL) 10 MG 24 hr tablet Take 10 mg by mouth daily with breakfast.    . allopurinol (ZYLOPRIM) 300 MG tablet TAKE 1 TABLET BY MOUTH EVERY DAY 90 tablet 1  . amLODipine (NORVASC) 10 MG tablet TAKE 1 TABLET BY MOUTH EVERY DAY 90 tablet 2  . aspirin EC 81 MG tablet Take 81 mg by mouth daily.    . carvedilol (COREG) 6.25 MG tablet TAKE 1 TABLET BY MOUTH TWICE A DAY WITH FOOD 180 tablet 0  . chlorthalidone (HYGROTON) 50 MG tablet Take 50 mg by mouth daily.    . cholecalciferol (VITAMIN D) 1000 units tablet Take 1,000 Units by mouth daily.    Marland Kitchen docusate sodium (COLACE) 100 MG capsule Take 100 mg by mouth 2 (two) times daily.    . Emollient (AVEENO POSITIVELY AGELESS EX) Apply 1 tablet topically daily.    . finasteride (PROSCAR) 5 MG tablet Take 5 mg by mouth daily.    . furosemide (LASIX) 20 MG tablet TAKE 1 TABLET BY MOUTH DAILY 90 tablet 1  . hydrALAZINE (APRESOLINE) 100 MG tablet TAKE 1 TABLET (100 MG TOTAL) BY MOUTH 3 (THREE) TIMES DAILY. 90 tablet 0  . ibuprofen (ADVIL) 200 MG tablet Take 200 mg by mouth every 6 (six) hours as needed.    . rosuvastatin (CRESTOR) 10 MG tablet TAKE  1 TABLET BY MOUTH EVERY DAY 90 tablet 1  . tamsulosin (FLOMAX) 0.4 MG CAPS capsule Take 0.4 mg by mouth daily.    . vitamin B-12 (CYANOCOBALAMIN) 500 MCG tablet Take 500 mcg by mouth daily.    . Garlic 10 MG CAPS Take by mouth.    . linaclotide (LINZESS) 145 MCG CAPS capsule Take 145 mcg by mouth as needed.     . sildenafil (REVATIO) 20 MG tablet TAKE 1 TABLET BY MOUTH ONCE DAILY AS NEEDED 30 tablet 2   No current facility-administered medications on file prior to visit.    PAST MEDICAL  HISTORY: Past Medical History:  Diagnosis Date  . BPH (benign prostatic hyperplasia)   . CKD (chronic kidney disease) stage 3, GFR 30-59 ml/min 04/05/2019  . Erectile dysfunction   . Gout   . Gout   . Heart murmur   . Hyperlipidemia   . Hypertension   . Obesity     PAST SURGICAL HISTORY: Past Surgical History:  Procedure Laterality Date  . CARDIAC SURGERY      FAMILY HISTORY: The patient's family history includes Healthy in his father and mother.   SOCIAL HISTORY:  The patient  reports that he has never smoked. He has never used smokeless tobacco. He reports that he does not drink alcohol and does not use drugs.  Review of Systems  Constitutional: Negative for chills and fever.  HENT: Negative for hoarse voice and nosebleeds.   Eyes: Negative for discharge, double vision and pain.  Cardiovascular: Negative for chest pain, claudication, dyspnea on exertion, leg swelling, near-syncope, orthopnea, palpitations, paroxysmal nocturnal dyspnea and syncope.  Respiratory: Negative for hemoptysis and shortness of breath.   Musculoskeletal: Negative for muscle cramps and myalgias.  Gastrointestinal: Negative for abdominal pain, constipation, diarrhea, hematemesis, hematochezia, melena, nausea and vomiting.  Neurological: Negative for dizziness and light-headedness.    PHYSICAL EXAM: Vitals with BMI 03/13/2020 10/09/2019 09/12/2019  Height 5\' 6"  5\' 6"  5\' 6"   Weight 278 lbs 278 lbs 13 oz 275 lbs  BMI 44.89 10.96 04.54  Systolic 098 119 147  Diastolic 78 76 87  Pulse 68 66 67   CONSTITUTIONAL: Well-developed and well-nourished. No acute distress.  SKIN: Skin is warm and dry. No rash noted. No cyanosis. No pallor. No jaundice HEAD: Normocephalic and atraumatic.  EYES: No scleral icterus MOUTH/THROAT: Moist oral membranes.  NECK: No JVD present. No thyromegaly noted. No carotid bruits  LYMPHATIC: No visible cervical adenopathy.  CHEST Normal respiratory effort. No intercostal  retractions  LUNGS: Clear to auscultation bilaterally.  No stridor. No wheezes. No rales.  CARDIOVASCULAR: Regular rate and rhythm, positive S1-S2, no murmurs rubs or gallops appreciated. ABDOMINAL: Obese, soft, nontender, nondistended, positive bowel sounds in all 4 quadrants.  No apparent ascites.  EXTREMITIES: Trace bilateral +1 peripheral edema, dry skin noted bilaterally, palpable bilateral dorsalis pedis pulses. HEMATOLOGIC: No significant bruising NEUROLOGIC: Oriented to person, place, and time. Nonfocal. Normal muscle tone.  PSYCHIATRIC: Normal mood and affect. Normal behavior. Cooperative  CARDIAC DATABASE: EKG: 03/13/2020: Normal sinus rhythm, 68 bpm, first-degree AV block (PR interval 246 ms), intraventricular conduction delay, prior inferior wall infarct, poor R wave progression, without underlying injury pattern.  No significant change compared to prior ECG.  Echocardiogram: 08/28/2019: Severe concentric LVH, grade 1 diastolic impairment, LVEF 50-55%, mildly dilated left atrium, mild MR, mild TR, no pulmonary hypertension.  Stress Testing:  Lexiscan myoview stress test 05/07/2016: Stress EKG is non-diagnostic for ischemia as it a pharmacologic stress using Lexiscan. Dilated  LV, 186 mL in both rest and stress images.  Perfusion imaging reveals a small to moderate-sized inferior wall scar extending from the base towards the apex with no significant peri-infarct ischemia.  In addition there is a very small sized severe ischemia in the apical lateral wall extending from the base towards the apex probably in the distribution of the diagonal branch of the LAD. Left ventricular systolic function was preserved with inferior wall hypokinesis, EF 59%. This is an intermediate risk study, clinical correlation recommended in a patient with BMI 45.  Heart Catheterization: None  LABORATORY DATA: CBC Latest Ref Rng & Units 03/29/2019 01/18/2019  WBC 3.4 - 10.8 x10E3/uL 4.6 4.5  Hemoglobin  13.0 - 17.7 g/dL 12.9(L) 12.7(L)  Hematocrit 37.5 - 51.0 % 39.4 38.9  Platelets 150 - 450 x10E3/uL 159 168    CMP Latest Ref Rng & Units 03/29/2019 01/18/2019  Glucose 65 - 99 mg/dL 104(H) 113(H)  BUN 8 - 27 mg/dL 27 32(H)  Creatinine 0.76 - 1.27 mg/dL 1.57(H) 1.57(H)  Sodium 134 - 144 mmol/L 139 138  Potassium 3.5 - 5.2 mmol/L 3.9 4.1  Chloride 96 - 106 mmol/L 98 100  CO2 20 - 29 mmol/L 25 25  Calcium 8.6 - 10.2 mg/dL 9.7 9.2  Total Protein 6.0 - 8.5 g/dL 8.1 7.3  Total Bilirubin 0.0 - 1.2 mg/dL 0.7 0.6  Alkaline Phos 39 - 117 IU/L 75 67  AST 0 - 40 IU/L 22 21  ALT 0 - 44 IU/L 18 18    Lipid Panel     Component Value Date/Time   CHOL 145 01/18/2019 1619   TRIG 185 (H) 01/18/2019 1619   HDL 37 (L) 01/18/2019 1619   CHOLHDL 3.9 01/18/2019 1619   LDLCALC 71 01/18/2019 1619   LABVLDL 37 01/18/2019 1619    No results found for: HGBA1C No components found for: NTPROBNP No results found for: TSH  Cardiac Panel (last 3 results) No results for input(s): CKTOTAL, CKMB, TROPONINIHS, RELINDX in the last 72 hours.  IMPRESSION:    ICD-10-CM   1. Hypertension with heart disease  I11.9 EKG 90-WIOX    Basic metabolic panel    Magnesium  2. Stage 3b chronic kidney disease  N18.32   3. Mixed hyperlipidemia  E78.2   4. Class 3 severe obesity due to excess calories with serious comorbidity and body mass index (BMI) of 40.0 to 44.9 in adult (HCC)  E66.01    Z68.41   5. Nonspecific abnormal electrocardiogram (ECG) (EKG)  R94.31      RECOMMENDATIONS: Andrew Schmitt is a 69 y.o. male whose past medical history and cardiovascular risk factors include: Hypertension, hyperlipidemia, chronic kidney disease stage IIIb, gout, GERD, mild anemia, BPH, erectile dysfunction, advanced age, obesity due to excess calories.  Benign essential hypertension, with chronic kidney disease stage III:  Patient is office blood pressures are acceptable.  However, patient states that his home blood  pressures at times can be significantly elevated with systolic blood pressures between 170-180 mmHg.  Patient checks his home blood pressures sporadically no more than 2 or 3 times a week.  I do not have a blood pressure log to review.  Review of blood pressure readings during prior office visits are relatively stable.  We will enroll the patient into principal care management for ambulatory blood pressure monitoring and obtain baseline BMP and magnesium level prior to uptitrating antihypertensive medications.  Patient is agreeable with this plan of care for now.  Medications reconciled.  Of  note, not able to change his hydralazine to BiDil or add Isordil as the patient uses Viagra at least 2 times a month.  Encounter to review test results:  Since then seen the patient for the first time I did review the last echo and nuclear stress test.  The nuclear stress test showed inferior fixed defect suggestive of prior scar and possible ischemia in the LAD distribution.  However, patient has been treated medically as he does not have symptoms of chest pain or dyspnea at rest or with effort alert activities in the setting of underlying chronic kidney disease.  Patient states that he does not want additional testing at this time.  We will continue to monitor.  Mixed hyperlipidemia: Currently on statin therapy.  Does not endorse myalgias.  Currently managed per primary team.  Obesity, due to excess calories: . Body mass index is 44.87 kg/m. . I reviewed with the patient the importance of diet, regular physical activity/exercise, weight loss.   . Patient is educated on increasing physical activity gradually as tolerated.  With the goal of moderate intensity exercise for 30 minutes a day 5 days a week.  FINAL MEDICATION LIST END OF ENCOUNTER: No orders of the defined types were placed in this encounter.   There are no discontinued medications.   Current Outpatient Medications:  .  alfuzosin  (UROXATRAL) 10 MG 24 hr tablet, Take 10 mg by mouth daily with breakfast., Disp: , Rfl:  .  allopurinol (ZYLOPRIM) 300 MG tablet, TAKE 1 TABLET BY MOUTH EVERY DAY, Disp: 90 tablet, Rfl: 1 .  amLODipine (NORVASC) 10 MG tablet, TAKE 1 TABLET BY MOUTH EVERY DAY, Disp: 90 tablet, Rfl: 2 .  aspirin EC 81 MG tablet, Take 81 mg by mouth daily., Disp: , Rfl:  .  carvedilol (COREG) 6.25 MG tablet, TAKE 1 TABLET BY MOUTH TWICE A DAY WITH FOOD, Disp: 180 tablet, Rfl: 0 .  chlorthalidone (HYGROTON) 50 MG tablet, Take 50 mg by mouth daily., Disp: , Rfl:  .  cholecalciferol (VITAMIN D) 1000 units tablet, Take 1,000 Units by mouth daily., Disp: , Rfl:  .  docusate sodium (COLACE) 100 MG capsule, Take 100 mg by mouth 2 (two) times daily., Disp: , Rfl:  .  Emollient (AVEENO POSITIVELY AGELESS EX), Apply 1 tablet topically daily., Disp: , Rfl:  .  finasteride (PROSCAR) 5 MG tablet, Take 5 mg by mouth daily., Disp: , Rfl:  .  furosemide (LASIX) 20 MG tablet, TAKE 1 TABLET BY MOUTH DAILY, Disp: 90 tablet, Rfl: 1 .  hydrALAZINE (APRESOLINE) 100 MG tablet, TAKE 1 TABLET (100 MG TOTAL) BY MOUTH 3 (THREE) TIMES DAILY., Disp: 90 tablet, Rfl: 0 .  ibuprofen (ADVIL) 200 MG tablet, Take 200 mg by mouth every 6 (six) hours as needed., Disp: , Rfl:  .  rosuvastatin (CRESTOR) 10 MG tablet, TAKE 1 TABLET BY MOUTH EVERY DAY, Disp: 90 tablet, Rfl: 1 .  tamsulosin (FLOMAX) 0.4 MG CAPS capsule, Take 0.4 mg by mouth daily., Disp: , Rfl:  .  vitamin B-12 (CYANOCOBALAMIN) 500 MCG tablet, Take 500 mcg by mouth daily., Disp: , Rfl:  .  Garlic 10 MG CAPS, Take by mouth., Disp: , Rfl:  .  linaclotide (LINZESS) 145 MCG CAPS capsule, Take 145 mcg by mouth as needed. , Disp: , Rfl:  .  sildenafil (REVATIO) 20 MG tablet, TAKE 1 TABLET BY MOUTH ONCE DAILY AS NEEDED, Disp: 30 tablet, Rfl: 2  Orders Placed This Encounter  Procedures  . Basic metabolic  panel  . Magnesium  . EKG 12-Lead    --Continue cardiac medications as reconciled in  final medication list. --Return in about 3 months (around 06/13/2020) for BP follow up.. Or sooner if needed. --Continue follow-up with your primary care physician regarding the management of your other chronic comorbid conditions.  Patient's questions and concerns were addressed to his satisfaction. He voices understanding of the instructions provided during this encounter.   This note was created using a voice recognition software as a result there may be grammatical errors inadvertently enclosed that do not reflect the nature of this encounter. Every attempt is made to correct such errors.  Rex Kras, [place], [facility]  Pager: [phone] Office: [phone]

## 2020-03-18 ENCOUNTER — Other Ambulatory Visit: Payer: Self-pay | Admitting: Cardiology

## 2020-03-18 DIAGNOSIS — N184 Chronic kidney disease, stage 4 (severe): Secondary | ICD-10-CM

## 2020-03-18 DIAGNOSIS — I119 Hypertensive heart disease without heart failure: Secondary | ICD-10-CM

## 2020-03-18 LAB — BASIC METABOLIC PANEL
BUN/Creatinine Ratio: 24 (ref 10–24)
BUN: 43 mg/dL — ABNORMAL HIGH (ref 8–27)
CO2: 24 mmol/L (ref 20–29)
Calcium: 9.1 mg/dL (ref 8.6–10.2)
Chloride: 104 mmol/L (ref 96–106)
Creatinine, Ser: 1.8 mg/dL — ABNORMAL HIGH (ref 0.76–1.27)
GFR calc Af Amer: 43 mL/min/{1.73_m2} — ABNORMAL LOW (ref >59)
GFR calc non Af Amer: 37 mL/min/{1.73_m2} — ABNORMAL LOW (ref >59)
Glucose: 107 mg/dL — ABNORMAL HIGH (ref 65–99)
Potassium: 4 mmol/L (ref 3.5–5.2)
Sodium: 141 mmol/L (ref 134–144)

## 2020-03-18 LAB — MAGNESIUM: Magnesium: 2 mg/dL (ref 1.6–2.3)

## 2020-03-19 ENCOUNTER — Telehealth: Payer: Self-pay

## 2020-03-19 NOTE — Telephone Encounter (Signed)
Called and spoke with patient, he is aware and agreeable to decrease Chlorthalidone, and STOP Lasix and Motrin. He will also go repeat blood work in 1 week.

## 2020-03-19 NOTE — Progress Notes (Signed)
Called patient, NA, LMAM see other message in chart.

## 2020-03-19 NOTE — Telephone Encounter (Signed)
-----   Message from [place], [place] sent at 03/18/2020 11:26 PM EDT ----- I had obtained baseline labs for the patient at the last office visit to see his baseline kidney function.   His kidney function is elevated compared to prior labs done 11 months ago.   For now have him decrease chlorthalidone to 25mg  po qAM. Stop Lasix and Motrin.   Repeat BMP in one week.

## 2020-03-31 NOTE — Telephone Encounter (Signed)
Called pt regarding missing 1 week f/u labs. Pt reports that he feels frustrated with the constant lab checks on dose adjustments. Pt states that he has a long hx of renal insufficiency and sees a nephrologist who had been constantly monitoring his renal function. Pt refusing repeat blood work and states that he "lives his life day by day." Pt states that he has a nephrologist who will monitor his kidney function. Pt aware that without appropriate follow up labworks, we wont be able to effectively titrate his therapy to ensure the benefits of the therapy outweigh any potential risk. Pt verbalized understanding and is aware of the risks and complications associated with worsening renal function including dialysis and mortality. Faxed recent law work to Dr. Joelyn Oms from [facility]

## 2020-04-03 ENCOUNTER — Encounter: Payer: Self-pay | Admitting: Internal Medicine

## 2020-04-03 ENCOUNTER — Ambulatory Visit (INDEPENDENT_AMBULATORY_CARE_PROVIDER_SITE_OTHER): Payer: Medicare PPO | Admitting: Internal Medicine

## 2020-04-03 ENCOUNTER — Ambulatory Visit (INDEPENDENT_AMBULATORY_CARE_PROVIDER_SITE_OTHER): Payer: Medicare PPO

## 2020-04-03 ENCOUNTER — Other Ambulatory Visit: Payer: Self-pay

## 2020-04-03 ENCOUNTER — Other Ambulatory Visit: Payer: Self-pay | Admitting: Cardiology

## 2020-04-03 VITALS — BP 150/76 | HR 79 | Temp 98.1°F | Ht 67.0 in | Wt 265.2 lb

## 2020-04-03 VITALS — BP 150/76 | HR 79 | Temp 98.1°F | Ht 67.0 in | Wt 265.0 lb

## 2020-04-03 DIAGNOSIS — N183 Chronic kidney disease, stage 3 unspecified: Secondary | ICD-10-CM

## 2020-04-03 DIAGNOSIS — Z0000 Encounter for general adult medical examination without abnormal findings: Secondary | ICD-10-CM | POA: Diagnosis not present

## 2020-04-03 DIAGNOSIS — M1A30X0 Chronic gout due to renal impairment, unspecified site, without tophus (tophi): Secondary | ICD-10-CM

## 2020-04-03 DIAGNOSIS — Z6841 Body Mass Index (BMI) 40.0 and over, adult: Secondary | ICD-10-CM

## 2020-04-03 DIAGNOSIS — I119 Hypertensive heart disease without heart failure: Secondary | ICD-10-CM

## 2020-04-03 DIAGNOSIS — I129 Hypertensive chronic kidney disease with stage 1 through stage 4 chronic kidney disease, or unspecified chronic kidney disease: Secondary | ICD-10-CM

## 2020-04-03 DIAGNOSIS — R7309 Other abnormal glucose: Secondary | ICD-10-CM

## 2020-04-03 LAB — POCT URINALYSIS DIPSTICK
Bilirubin, UA: NEGATIVE
Glucose, UA: NEGATIVE
Ketones, UA: NEGATIVE
Nitrite, UA: NEGATIVE
Protein, UA: POSITIVE — AB
Spec Grav, UA: 1.015 (ref 1.010–1.025)
Urobilinogen, UA: 0.2 E.U./dL
pH, UA: 5.5 (ref 5.0–8.0)

## 2020-04-03 LAB — POCT UA - MICROALBUMIN
Albumin/Creatinine Ratio, Urine, POC: >300
Creatinine, POC: 200 mg/dL
Microalbumin Ur, POC: 150 mg/L

## 2020-04-03 NOTE — Patient Instructions (Signed)

## 2020-04-03 NOTE — Progress Notes (Signed)
I,Tianna Badgett,acting as a Education administrator for Maximino Greenland, MD.,have documented all relevant documentation on the behalf of Maximino Greenland, MD,as directed by  Maximino Greenland, MD while in the presence of Maximino Greenland, MD.  This visit occurred during the SARS-CoV-2 public health emergency.  Safety protocols were in place, including screening questions prior to the visit, additional usage of staff PPE, and extensive cleaning of exam room while observing appropriate contact time as indicated for disinfecting solutions.  Subjective:     Patient ID: DIXIE JAFRI , male    DOB: [birth year] , 69 y.o.   MRN: [id number]   Chief Complaint  Patient presents with  . Annual Exam  . Hypertension    HPI  Patient presents for full physical. He does not have any complaints at this time. He states that he is compliant with medication. He is followed by Dr Woody Seller as his cardiologist. He sees Dr Avie Echevaria at [facility].  Hypertension This is a chronic problem. The current episode started more than 1 year ago. The problem has been gradually worsening since onset. The problem is uncontrolled. Pertinent negatives include no anxiety. There are no associated agents to hypertension. Risk factors for coronary artery disease include sedentary lifestyle, obesity, male gender, dyslipidemia and diabetes mellitus. Past treatments include calcium channel blockers and diuretics. The current treatment provides no improvement. There are no compliance problems.  There is no history of angina or kidney disease. There is no history of chronic renal disease.     Past Medical History:  Diagnosis Date  . BPH (benign prostatic hyperplasia)   . CKD (chronic kidney disease) stage 3, GFR 30-59 ml/min 04/05/2019  . Erectile dysfunction   . Gout   . Gout   . Heart murmur   . Hyperlipidemia   . Hypertension   . Obesity      Family History  Problem Relation Age of Onset  . Healthy Mother   . Healthy Father       Current Outpatient Medications:  .  alfuzosin (UROXATRAL) 10 MG 24 hr tablet, Take 10 mg by mouth daily with breakfast., Disp: , Rfl:  .  allopurinol (ZYLOPRIM) 300 MG tablet, TAKE 1 TABLET BY MOUTH EVERY DAY, Disp: 90 tablet, Rfl: 1 .  amLODipine (NORVASC) 10 MG tablet, TAKE 1 TABLET BY MOUTH EVERY DAY, Disp: 90 tablet, Rfl: 2 .  aspirin EC 81 MG tablet, Take 81 mg by mouth daily., Disp: , Rfl:  .  carvedilol (COREG) 6.25 MG tablet, TAKE 1 TABLET BY MOUTH TWICE A DAY WITH FOOD, Disp: 180 tablet, Rfl: 0 .  chlorthalidone (HYGROTON) 50 MG tablet, Take 50 mg by mouth daily., Disp: , Rfl:  .  cholecalciferol (VITAMIN D) 1000 units tablet, Take 1,000 Units by mouth daily., Disp: , Rfl:  .  docusate sodium (COLACE) 100 MG capsule, Take 100 mg by mouth 2 (two) times daily., Disp: , Rfl:  .  Emollient (AVEENO POSITIVELY AGELESS EX), Apply 1 tablet topically daily., Disp: , Rfl:  .  finasteride (PROSCAR) 5 MG tablet, Take 5 mg by mouth daily., Disp: , Rfl:  .  furosemide (LASIX) 20 MG tablet, TAKE 1 TABLET BY MOUTH DAILY, Disp: 90 tablet, Rfl: 1 .  Garlic 10 MG CAPS, Take by mouth., Disp: , Rfl:  .  ibuprofen (ADVIL) 200 MG tablet, Take 200 mg by mouth every 6 (six) hours as needed., Disp: , Rfl:  .  linaclotide (LINZESS) 145 MCG CAPS capsule, Take 145 mcg  by mouth as needed. , Disp: , Rfl:  .  rosuvastatin (CRESTOR) 10 MG tablet, TAKE 1 TABLET BY MOUTH EVERY DAY, Disp: 90 tablet, Rfl: 1 .  sildenafil (REVATIO) 20 MG tablet, TAKE 1 TABLET BY MOUTH ONCE DAILY AS NEEDED, Disp: 30 tablet, Rfl: 2 .  tamsulosin (FLOMAX) 0.4 MG CAPS capsule, Take 0.4 mg by mouth daily., Disp: , Rfl:  .  vitamin B-12 (CYANOCOBALAMIN) 500 MCG tablet, Take 500 mcg by mouth daily., Disp: , Rfl:  .  hydrALAZINE (APRESOLINE) 100 MG tablet, TAKE 1 TABLET (100 MG TOTAL) BY MOUTH 3 (THREE) TIMES DAILY., Disp: 90 tablet, Rfl: 0   Allergies  Allergen Reactions  . Ace Inhibitors Cough     Men's preventive visit. Patient  Health Questionnaire (PHQ-2) is    Office Visit from 04/03/2020 in [facility]  PHQ-2 Total Score 0    . Patient is on a low salt diet. Marital status: Married. Relevant history for alcohol use is:  Social History   Substance and Sexual Activity  Alcohol Use No  . Relevant history for tobacco use is:  Social History   Tobacco Use  Smoking Status Never Smoker  Smokeless Tobacco Never Used  .   Review of Systems  Constitutional: Negative.   HENT: Negative.   Eyes: Negative.   Respiratory: Negative.   Cardiovascular: Negative.   Gastrointestinal: Negative.   Endocrine: Negative.   Genitourinary: Negative.   Musculoskeletal: Negative.   Skin: Negative.   Allergic/Immunologic: Negative.   Neurological: Negative.   Hematological: Negative.   Psychiatric/Behavioral: Negative.      Today's Vitals   04/03/20 0947  BP: (!) 150/76  Pulse: 79  Temp: 98.1 F (36.7 C)  TempSrc: Oral  Weight: 265 lb (120.2 kg)  Height: 5\' 7"  (1.702 m)   Body mass index is 41.5 kg/m.   Objective:  Physical Exam Vitals and nursing note reviewed.  Constitutional:      Appearance: Normal appearance. He is obese.  HENT:     Head: Normocephalic and atraumatic.     Right Ear: Tympanic membrane, ear canal and external ear normal.     Left Ear: Tympanic membrane, ear canal and external ear normal.     Nose:     Comments: Deferred, masked    Mouth/Throat:     Comments: Deferred, masked Eyes:     Extraocular Movements: Extraocular movements intact.     Conjunctiva/sclera: Conjunctivae normal.     Pupils: Pupils are equal, round, and reactive to light.  Cardiovascular:     Rate and Rhythm: Normal rate and regular rhythm.     Pulses:          Dorsalis pedis pulses are 1+ on the right side and 1+ on the left side.     Heart sounds: Normal heart sounds.  Pulmonary:     Effort: Pulmonary effort is normal.     Breath sounds: Normal breath sounds.  Chest:     Breasts:         Right: Normal. No swelling, bleeding, inverted nipple, mass or nipple discharge.        Left: Normal. No swelling, bleeding, inverted nipple, mass or nipple discharge.  Abdominal:     General: Bowel sounds are normal.     Palpations: Abdomen is soft.     Comments: Rounded, soft.   Genitourinary:    Comments: Deferred, per patient.  Musculoskeletal:        General: Normal range of motion.  Cervical back: Normal range of motion and neck supple.  Feet:     Right foot:     Skin integrity: Callus and dry skin present.     Left foot:     Skin integrity: Callus and dry skin present.  Skin:    General: Skin is warm.  Neurological:     General: No focal deficit present.     Mental Status: He is alert.  Psychiatric:        Mood and Affect: Mood normal.        Behavior: Behavior normal.         Assessment And Plan:    1. Routine general medical examination at a health care facility Comments: A full exam was performed. DRE deferred, as per patient.  He is also followed by Urology.   2. Benign hypertensive renal disease Comments: Chronic, uncontrolled. He has yet to [facility] will continue with current meds. He is encouraged to avoid adding salt to his foods. EKG performed, no acute changes  - VITAMIN D 25 Hydroxy (Vit-D Deficiency, Fractures) - CBC - Lipid panel  3. Stage 3 chronic kidney disease, unspecified whether stage 3a or 3b CKD Comments: Chronic, also followed by Renal. He is encouraged to strive to keep BP controlled and to stay well hydrated to avoid progression of CKD.   4. Chronic gout due to renal impairment without tophus, unspecified site Comments: Chronic, I will check uric acid level today. He is encouraged to take allopurinol daily.  - Uric acid  5. Other abnormal glucose - Hemoglobin A1c - Insulin, random(561)  6. Class 3 severe obesity due to excess calories with serious comorbidity and body mass index (BMI) of 40.0 to 44.9 in adult [facility]) Comments: BMI  41. He was congratuHe is encouraged to strive for BMI less than 35 to decrease cardiac risk. Encouraged to incorporate more exercise into his daily routine.  Wt Readings from Last 3 Encounters:  04/03/20 265 lb (120.2 kg)  04/03/20 265 lb 3.2 oz (120.3 kg)  03/13/20 (!) 278 lb (126.1 kg)       Patient was given opportunity to ask questions. Patient verbalized understanding of the plan and was able to repeat key elements of the plan. All questions were answered to their satisfaction.   Maximino Greenland, MD   I, Maximino Greenland, MD, have reviewed all documentation for this visit. The documentation on 04/13/20 for the exam, diagnosis, procedures, and orders are all accurate and complete.  THE PATIENT IS ENCOURAGED TO PRACTICE SOCIAL DISTANCING DUE TO THE COVID-19 PANDEMIC.

## 2020-04-03 NOTE — Patient Instructions (Signed)
[name] , Thank you for taking time to come for your Medicare Wellness Visit. I appreciate your ongoing commitment to your health goals. Please review the following plan we discussed and let me know if I can assist you in the future.   Screening recommendations/referrals: Colonoscopy: completed 01/02/2016 Recommended yearly ophthalmology/optometry visit for glaucoma screening and checkup Recommended yearly dental visit for hygiene and checkup  Vaccinations: Influenza vaccine: decline Pneumococcal vaccine: completed 06/23/2016 Tdap vaccine: completed 08/06/2011 Shingles vaccine: discussed   Covid-19: 09/21/2019, 08/24/2019  Advanced directives: Please bring a copy of your POA (Power of Attorney) and/or Living Will to your next appointment.   Conditions/risks identified: none  Next appointment: Follow up in one year for your annual wellness visit. 04/09/2021 at 9:00  Preventive Care 65 Years and Older, Male Preventive care refers to lifestyle choices and visits with your health care provider that can promote health and wellness. What does preventive care include?  A yearly physical exam. This is also called an annual well check.  Dental exams once or twice a year.  Routine eye exams. Ask your health care provider how often you should have your eyes checked.  Personal lifestyle choices, including:  Daily care of your teeth and gums.  Regular physical activity.  Eating a healthy diet.  Avoiding tobacco and drug use.  Limiting alcohol use.  Practicing safe sex.  Taking low doses of aspirin every day.  Taking vitamin and mineral supplements as recommended by your health care provider. What happens during an annual well check? The services and screenings done by your health care provider during your annual well check will depend on your age, overall health, lifestyle risk factors, and family history of disease. Counseling  Your health care provider may ask you questions about  your:  Alcohol use.  Tobacco use.  Drug use.  Emotional well-being.  Home and relationship well-being.  Sexual activity.  Eating habits.  History of falls.  Memory and ability to understand (cognition).  Work and work Statistician. Screening  You may have the following tests or measurements:  Height, weight, and BMI.  Blood pressure.  Lipid and cholesterol levels. These may be checked every 5 years, or more frequently if you are over 52 years old.  Skin check.  Lung cancer screening. You may have this screening every year starting at age 75 if you have a 30-pack-year history of smoking and currently smoke or have quit within the past 15 years.  Fecal occult blood test (FOBT) of the stool. You may have this test every year starting at age 92.  Flexible sigmoidoscopy or colonoscopy. You may have a sigmoidoscopy every 5 years or a colonoscopy every 10 years starting at age 15.  Prostate cancer screening. Recommendations will vary depending on your family history and other risks.  Hepatitis C blood test.  Hepatitis B blood test.  Sexually transmitted disease (STD) testing.  Diabetes screening. This is done by checking your blood sugar (glucose) after you have not eaten for a while (fasting). You may have this done every 1-3 years.  Abdominal aortic aneurysm (AAA) screening. You may need this if you are a current or former smoker.  Osteoporosis. You may be screened starting at age 14 if you are at high risk. Talk with your health care provider about your test results, treatment options, and if necessary, the need for more tests. Vaccines  Your health care provider may recommend certain vaccines, such as:  Influenza vaccine. This is recommended every year.  Tetanus, diphtheria, and acellular pertussis (Tdap, Td) vaccine. You may need a Td booster every 10 years.  Zoster vaccine. You may need this after age 55.  Pneumococcal 13-valent conjugate (PCV13) vaccine.  One dose is recommended after age 38.  Pneumococcal polysaccharide (PPSV23) vaccine. One dose is recommended after age 91. Talk to your health care provider about which screenings and vaccines you need and how often you need them. This information is not intended to replace advice given to you by your health care provider. Make sure you discuss any questions you have with your health care provider. Document Released: 08/29/2015 Document Revised: 04/21/2016 Document Reviewed: 06/03/2015 Elsevier Interactive Patient Education  2017 [place] Prevention in the Home Falls can cause injuries. They can happen to people of all ages. There are many things you can do to make your home safe and to help prevent falls. What can I do on the outside of my home?  Regularly fix the edges of walkways and driveways and fix any cracks.  Remove anything that might make you trip as you walk through a door, such as a raised step or threshold.  Trim any bushes or trees on the path to your home.  Use bright outdoor lighting.  Clear any walking paths of anything that might make someone trip, such as rocks or tools.  Regularly check to see if handrails are loose or broken. Make sure that both sides of any steps have handrails.  Any raised decks and porches should have guardrails on the edges.  Have any leaves, snow, or ice cleared regularly.  Use sand or salt on walking paths during winter.  Clean up any spills in your garage right away. This includes oil or grease spills. What can I do in the bathroom?  Use night lights.  Install grab bars by the toilet and in the tub and shower. Do not use towel bars as grab bars.  Use non-skid mats or decals in the tub or shower.  If you need to sit down in the shower, use a plastic, non-slip stool.  Keep the floor dry. Clean up any water that spills on the floor as soon as it happens.  Remove soap buildup in the tub or shower regularly.  Attach bath  mats securely with double-sided non-slip rug tape.  Do not have throw rugs and other things on the floor that can make you trip. What can I do in the bedroom?  Use night lights.  Make sure that you have a light by your bed that is easy to reach.  Do not use any sheets or blankets that are too big for your bed. They should not hang down onto the floor.  Have a firm chair that has side arms. You can use this for support while you get dressed.  Do not have throw rugs and other things on the floor that can make you trip. What can I do in the kitchen?  Clean up any spills right away.  Avoid walking on wet floors.  Keep items that you use a lot in easy-to-reach places.  If you need to reach something above you, use a strong step stool that has a grab bar.  Keep electrical cords out of the way.  Do not use floor polish or wax that makes floors slippery. If you must use wax, use non-skid floor wax.  Do not have throw rugs and other things on the floor that can make you trip. What can I do  with my stairs?  Do not leave any items on the stairs.  Make sure that there are handrails on both sides of the stairs and use them. Fix handrails that are broken or loose. Make sure that handrails are as long as the stairways.  Check any carpeting to make sure that it is firmly attached to the stairs. Fix any carpet that is loose or worn.  Avoid having throw rugs at the top or bottom of the stairs. If you do have throw rugs, attach them to the floor with carpet tape.  Make sure that you have a light switch at the top of the stairs and the bottom of the stairs. If you do not have them, ask someone to add them for you. What else can I do to help prevent falls?  Wear shoes that:  Do not have high heels.  Have rubber bottoms.  Are comfortable and fit you well.  Are closed at the toe. Do not wear sandals.  If you use a stepladder:  Make sure that it is fully opened. Do not climb a closed  stepladder.  Make sure that both sides of the stepladder are locked into place.  Ask someone to hold it for you, if possible.  Clearly mark and make sure that you can see:  Any grab bars or handrails.  First and last steps.  Where the edge of each step is.  Use tools that help you move around (mobility aids) if they are needed. These include:  Canes.  Walkers.  Scooters.  Crutches.  Turn on the lights when you go into a dark area. Replace any light bulbs as soon as they burn out.  Set up your furniture so you have a clear path. Avoid moving your furniture around.  If any of your floors are uneven, fix them.  If there are any pets around you, be aware of where they are.  Review your medicines with your doctor. Some medicines can make you feel dizzy. This can increase your chance of falling. Ask your doctor what other things that you can do to help prevent falls. This information is not intended to replace advice given to you by your health care provider. Make sure you discuss any questions you have with your health care provider. Document Released: 05/29/2009 Document Revised: 01/08/2016 Document Reviewed: 09/06/2014 Elsevier Interactive Patient Education  2017 [employer].

## 2020-04-03 NOTE — Progress Notes (Signed)
This visit occurred during the SARS-CoV-2 public health emergency.  Safety protocols were in place, including screening questions prior to the visit, additional usage of staff PPE, and extensive cleaning of exam room while observing appropriate contact time as indicated for disinfecting solutions.  Subjective:   Andrew Schmitt is a 69 y.o. male who presents for Medicare Annual/Subsequent preventive examination.  Review of Systems     Cardiac Risk Factors include: advanced age (>56men, >57 women);hypertension;male gender;obesity (BMI >30kg/m2);sedentary lifestyle     Objective:    Today's Vitals   04/03/20 0930  BP: (!) 150/76  Pulse: 79  Temp: 98.1 F (36.7 C)  TempSrc: Oral  SpO2: 97%  Weight: 265 lb 3.2 oz (120.3 kg)  Height: 5\' 7"  (1.702 m)   Body mass index is 41.54 kg/m.  Advanced Directives 04/03/2020 03/29/2019 11/02/2016  Does Patient Have a Medical Advance Directive? Yes Yes No  Type of Paramedic of [place];Living will Energy;Living will -  Copy of [place] in Chart? No - copy requested No - copy requested -  Would patient like information on creating a medical advance directive? - - No - Patient declined    Current Medications (verified) Outpatient Encounter Medications as of 04/03/2020  Medication Sig  . alfuzosin (UROXATRAL) 10 MG 24 hr tablet Take 10 mg by mouth daily with breakfast.  . allopurinol (ZYLOPRIM) 300 MG tablet TAKE 1 TABLET BY MOUTH EVERY DAY  . amLODipine (NORVASC) 10 MG tablet TAKE 1 TABLET BY MOUTH EVERY DAY  . aspirin EC 81 MG tablet Take 81 mg by mouth daily.  . carvedilol (COREG) 6.25 MG tablet TAKE 1 TABLET BY MOUTH TWICE A DAY WITH FOOD  . chlorthalidone (HYGROTON) 50 MG tablet Take 50 mg by mouth daily.  . cholecalciferol (VITAMIN D) 1000 units tablet Take 1,000 Units by mouth daily.  Marland Kitchen docusate sodium (COLACE) 100 MG capsule Take 100 mg by mouth 2 (two) times daily.  .  Emollient (AVEENO POSITIVELY AGELESS EX) Apply 1 tablet topically daily.  . finasteride (PROSCAR) 5 MG tablet Take 5 mg by mouth daily.  . furosemide (LASIX) 20 MG tablet TAKE 1 TABLET BY MOUTH DAILY  . Garlic 10 MG CAPS Take by mouth.  . hydrALAZINE (APRESOLINE) 100 MG tablet TAKE 1 TABLET (100 MG TOTAL) BY MOUTH 3 (THREE) TIMES DAILY.  Marland Kitchen ibuprofen (ADVIL) 200 MG tablet Take 200 mg by mouth every 6 (six) hours as needed.  . linaclotide (LINZESS) 145 MCG CAPS capsule Take 145 mcg by mouth as needed.   . rosuvastatin (CRESTOR) 10 MG tablet TAKE 1 TABLET BY MOUTH EVERY DAY  . sildenafil (REVATIO) 20 MG tablet TAKE 1 TABLET BY MOUTH ONCE DAILY AS NEEDED  . tamsulosin (FLOMAX) 0.4 MG CAPS capsule Take 0.4 mg by mouth daily.  . vitamin B-12 (CYANOCOBALAMIN) 500 MCG tablet Take 500 mcg by mouth daily.   No facility-administered encounter medications on file as of 04/03/2020.    Allergies (verified) Ace inhibitors   History: Past Medical History:  Diagnosis Date  . BPH (benign prostatic hyperplasia)   . CKD (chronic kidney disease) stage 3, GFR 30-59 ml/min 04/05/2019  . Erectile dysfunction   . Gout   . Gout   . Heart murmur   . Hyperlipidemia   . Hypertension   . Obesity    Past Surgical History:  Procedure Laterality Date  . CARDIAC SURGERY     Family History  Problem Relation Age of Onset  .  Healthy Mother   . Healthy Father    Social History   Socioeconomic History  . Marital status: Married    Spouse name: Not on file  . Number of children: 4  . Years of education: Not on file  . Highest education level: Not on file  Occupational History  . Occupation: retired  Tobacco Use  . Smoking status: Never Smoker  . Smokeless tobacco: Never Used  Vaping Use  . Vaping Use: Never used  Substance and Sexual Activity  . Alcohol use: No  . Drug use: No  . Sexual activity: Yes  Other Topics Concern  . Not on file  Social History Narrative  . Not on file   Social  Determinants of Health   Financial Resource Strain: Low Risk   . Difficulty of Paying Living Expenses: Not hard at all  Food Insecurity: No Food Insecurity  . Worried About Charity fundraiser in the Last Year: Never true  . Ran Out of Food in the Last Year: Never true  Transportation Needs: No Transportation Needs  . Lack of Transportation (Medical): No  . Lack of Transportation (Non-Medical): No  Physical Activity: Insufficiently Active  . Days of Exercise per Week: 3 days  . Minutes of Exercise per Session: 30 min  Stress: No Stress Concern Present  . Feeling of Stress : Not at all  Social Connections:   . Frequency of Communication with Friends and Family: Not on file  . Frequency of Social Gatherings with Friends and Family: Not on file  . Attends Religious Services: Not on file  . Active Member of Clubs or Organizations: Not on file  . Attends Archivist Meetings: Not on file  . Marital Status: Not on file    Tobacco Counseling Counseling given: Not Answered   Clinical Intake:  Pre-visit preparation completed: Yes  Pain : No/denies pain     Nutritional Status: BMI > 30  Obese Nutritional Risks: None Diabetes: No  How often do you need to have someone help you when you read instructions, pamphlets, or other written materials from your doctor or pharmacy?: 1 - Never What is the last grade level you completed in school?: 8 years of college  Diabetic? no  Interpreter Needed?: No  Information entered by :: NAllen LPN   Activities of Daily Living In your present state of health, do you have any difficulty performing the following activities: 04/03/2020 04/02/2020  Hearing? N N  Vision? N N  Difficulty concentrating or making decisions? N N  Walking or climbing stairs? N N  Dressing or bathing? N N  Doing errands, shopping? N N  Preparing Food and eating ? N -  Using the Toilet? N -  In the past six months, have you accidently leaked urine? N -  Do  you have problems with loss of bowel control? N -  Managing your Medications? N -  Managing your Finances? N -  Housekeeping or managing your Housekeeping? N -  Some recent data might be hidden    Patient Care Team: Glendale Chard, MD as PCP - General (Internal Medicine)  Indicate any recent Medical Services you may have received from other than [facility] providers in the past year (date may be approximate).     Assessment:   This is a routine wellness examination for [facility].  Hearing/Vision screen  Hearing Screening   125Hz  250Hz  500Hz  1000Hz  2000Hz  3000Hz  4000Hz  6000Hz  8000Hz   Right ear:  Left ear:           Vision Screening Comments: No regular eye exams,   Dietary issues and exercise activities discussed: Current Exercise Habits: Home exercise routine, Type of exercise: walking, Time (Minutes): 30, Frequency (Times/Week): 3, Weekly Exercise (Minutes/Week): 90  Goals    . Patient Stated     04/03/2020, wants to maintain BP and get down to 260 pounds    . Weight (lb) < 200 lb (90.7 kg)      Depression Screen PHQ 2/9 Scores 04/03/2020 04/02/2020 03/29/2019 01/18/2019  PHQ - 2 Score 0 0 0 0  PHQ- 9 Score 0 - 0 -    Fall Risk Fall Risk  04/03/2020 10/09/2019 03/29/2019 01/18/2019  Falls in the past year? 0 0 0 0  Risk for fall due to : Medication side effect - Medication side effect -  Follow up Falls evaluation completed;Education provided;Falls prevention discussed - Falls evaluation completed;Education provided;Falls prevention discussed -    Any stairs in or around the home? Yes  If so, are there any without handrails? No  Home free of loose throw rugs in walkways, pet beds, electrical cords, etc? Yes  Adequate lighting in your home to reduce risk of falls? Yes   ASSISTIVE DEVICES UTILIZED TO PREVENT FALLS:  Life alert? No  Use of a cane, walker or w/c? No  Grab bars in the bathroom? Yes  Shower chair or bench in shower? No  Elevated toilet seat or a handicapped  toilet? No   TIMED UP AND GO:  Was the test performed? No .    Gait slow and steady without use of assistive device  Cognitive Function:     6CIT Screen 04/03/2020 03/29/2019  What Year? 0 points 0 points  What month? 0 points 0 points  What time? 0 points 0 points  Count back from 20 0 points 0 points  Months in reverse 2 points 2 points  Repeat phrase 0 points 0 points  Total Score 2 2    Immunizations Immunization History  Administered Date(s) Administered  . Moderna SARS-COVID-2 Vaccination 08/24/2019, 09/21/2019    TDAP status: Up to date Flu Vaccine status: Declined, Education has been provided regarding the importance of this vaccine but patient still declined. Advised may receive this vaccine at local pharmacy or Health Dept. Aware to provide a copy of the vaccination record if obtained from local pharmacy or Health Dept. Verbalized acceptance and understanding. Pneumococcal vaccine status: Up to date Covid-19 vaccine status: Completed vaccines  Qualifies for Shingles Vaccine? Yes   Zostavax completed No   Shingrix Completed?: No.    Education has been provided regarding the importance of this vaccine. Patient has been advised to call insurance company to determine out of pocket expense if they have not yet received this vaccine. Advised may also receive vaccine at local pharmacy or Health Dept. Verbalized acceptance and understanding.  Screening Tests Health Maintenance  Topic Date Due  . INFLUENZA VACCINE  11/13/2020 (Originally 03/16/2020)  . TETANUS/TDAP  08/05/2021  . COLONOSCOPY  01/01/2026  . COVID-19 Vaccine  Completed  . Hepatitis C Screening  Completed  . PNA vac Low Risk Adult  Completed    Health Maintenance  There are no preventive care reminders to display for this patient.  Colorectal cancer screening: Completed 01/02/2016. Repeat every 10 years  Lung Cancer Screening: (Low Dose CT Chest recommended if Age 60-80 years, 30 pack-year currently  smoking OR have quit w/in 15years.) does not qualify.  Lung Cancer Screening Referral: no  Additional Screening:  Hepatitis C Screening: does qualify; Completed 11/16/2012  Vision Screening: Recommended annual ophthalmology exams for early detection of glaucoma and other disorders of the eye. Is the patient up to date with their annual eye exam?  No  Who is the provider or what is the name of the office in which the patient attends annual eye exams? Can't remember If pt is not established with a provider, would they like to be referred to a provider to establish care? No .   Dental Screening: Recommended annual dental exams for proper oral hygiene  Community Resource Referral / Chronic Care Management: CRR required this visit?  No   CCM required this visit?  No      Plan:     I have personally reviewed and noted the following in the patient's chart:   . Medical and social history . Use of alcohol, tobacco or illicit drugs  . Current medications and supplements . Functional ability and status . Nutritional status . Physical activity . Advanced directives . List of other physicians . Hospitalizations, surgeries, and ER visits in previous 12 months . Vitals . Screenings to include cognitive, depression, and falls . Referrals and appointments  In addition, I have reviewed and discussed with patient certain preventive protocols, quality metrics, and best practice recommendations. A written personalized care plan for preventive services as well as general preventive health recommendations were provided to patient.     Kellie Simmering, LPN   [id number]   Nurse Notes:

## 2020-04-04 LAB — LIPID PANEL
Chol/HDL Ratio: 5.2 ratio — ABNORMAL HIGH (ref 0.0–5.0)
Cholesterol, Total: 196 mg/dL (ref 100–199)
HDL: 38 mg/dL — ABNORMAL LOW (ref >39)
LDL Chol Calc (NIH): 129 mg/dL — ABNORMAL HIGH (ref 0–99)
Triglycerides: 164 mg/dL — ABNORMAL HIGH (ref 0–149)
VLDL Cholesterol Cal: 29 mg/dL (ref 5–40)

## 2020-04-04 LAB — CBC
Hematocrit: 37.3 % — ABNORMAL LOW (ref 37.5–51.0)
Hemoglobin: 12.1 g/dL — ABNORMAL LOW (ref 13.0–17.7)
MCH: 29.8 pg (ref 26.6–33.0)
MCHC: 32.4 g/dL (ref 31.5–35.7)
MCV: 92 fL (ref 79–97)
Platelets: 148 10*3/uL — ABNORMAL LOW (ref 150–450)
RBC: 4.06 x10E6/uL — ABNORMAL LOW (ref 4.14–5.80)
RDW: 12 % (ref 11.6–15.4)
WBC: 3.1 10*3/uL — ABNORMAL LOW (ref 3.4–10.8)

## 2020-04-04 LAB — HEMOGLOBIN A1C
Est. average glucose Bld gHb Est-mCnc: 100 mg/dL
Hgb A1c MFr Bld: 5.1 % (ref 4.8–5.6)

## 2020-04-04 LAB — VITAMIN D 25 HYDROXY (VIT D DEFICIENCY, FRACTURES): Vit D, 25-Hydroxy: 30.2 ng/mL (ref 30.0–100.0)

## 2020-04-04 LAB — URIC ACID: Uric Acid: 8.8 mg/dL — ABNORMAL HIGH (ref 3.8–8.4)

## 2020-04-04 LAB — INSULIN, RANDOM: INSULIN: 35 u[IU]/mL — ABNORMAL HIGH (ref 2.6–24.9)

## 2020-04-14 ENCOUNTER — Telehealth: Payer: Self-pay

## 2020-04-14 NOTE — Telephone Encounter (Signed)
I called the pt the pt because Dr. Baird Cancer wanted the pt to know his  white count, blood count and platelets are low. is he willing to see hematologist?

## 2020-04-15 ENCOUNTER — Telehealth: Payer: Self-pay

## 2020-04-15 ENCOUNTER — Other Ambulatory Visit: Payer: Self-pay

## 2020-04-15 DIAGNOSIS — D696 Thrombocytopenia, unspecified: Secondary | ICD-10-CM

## 2020-04-15 NOTE — Telephone Encounter (Signed)
The pt was notified that Dr. Baird Cancer wanted to know if the pt agrees to see a hematologist because his WBC and Platelets are low.  The pt said that he wanted to take some over the counter pills called blood boosters to help build his blood supply before.

## 2020-04-15 NOTE — Telephone Encounter (Signed)
The patient called and said he agrees to a hematology referral for evaluation of low WBC and platelet counts.

## 2020-04-16 ENCOUNTER — Telehealth: Payer: Self-pay | Admitting: Physician Assistant

## 2020-04-16 NOTE — Telephone Encounter (Signed)
Received a new hem referral from Dr. Baird Cancer for dx of thrombocytopenia. [name] returned my call and has been scheduled to see [name] on 9/16 at 1pm w/labs at 1230pm. Pt aware to arrive 15 minutes early.

## 2020-04-27 ENCOUNTER — Other Ambulatory Visit: Payer: Self-pay | Admitting: Internal Medicine

## 2020-05-01 ENCOUNTER — Encounter: Payer: Medicare Other | Admitting: Physician Assistant

## 2020-05-01 ENCOUNTER — Other Ambulatory Visit: Payer: Medicare Other

## 2020-05-05 ENCOUNTER — Other Ambulatory Visit: Payer: Medicare PPO

## 2020-05-10 ENCOUNTER — Other Ambulatory Visit: Payer: Self-pay | Admitting: Internal Medicine

## 2020-06-07 ENCOUNTER — Other Ambulatory Visit: Payer: Self-pay | Admitting: Internal Medicine

## 2020-06-07 ENCOUNTER — Other Ambulatory Visit: Payer: Self-pay | Admitting: Urology

## 2020-06-13 ENCOUNTER — Ambulatory Visit: Payer: Medicare PPO | Admitting: Cardiology

## 2020-07-24 ENCOUNTER — Other Ambulatory Visit: Payer: Self-pay | Admitting: Internal Medicine

## 2020-07-24 ENCOUNTER — Other Ambulatory Visit: Payer: Self-pay | Admitting: Cardiology

## 2020-08-05 ENCOUNTER — Other Ambulatory Visit: Payer: Self-pay | Admitting: Internal Medicine

## 2020-09-26 ENCOUNTER — Other Ambulatory Visit: Payer: Self-pay | Admitting: Internal Medicine

## 2020-10-07 ENCOUNTER — Ambulatory Visit: Payer: Medicare PPO | Admitting: Internal Medicine

## 2020-10-07 ENCOUNTER — Other Ambulatory Visit: Payer: Self-pay

## 2020-10-07 ENCOUNTER — Encounter: Payer: Self-pay | Admitting: Internal Medicine

## 2020-10-07 VITALS — BP 122/68 | HR 70 | Temp 98.3°F | Ht 67.0 in | Wt 284.2 lb

## 2020-10-07 DIAGNOSIS — N183 Chronic kidney disease, stage 3 unspecified: Secondary | ICD-10-CM | POA: Diagnosis not present

## 2020-10-07 DIAGNOSIS — Z6841 Body Mass Index (BMI) 40.0 and over, adult: Secondary | ICD-10-CM | POA: Diagnosis not present

## 2020-10-07 DIAGNOSIS — D72819 Decreased white blood cell count, unspecified: Secondary | ICD-10-CM | POA: Diagnosis not present

## 2020-10-07 DIAGNOSIS — I129 Hypertensive chronic kidney disease with stage 1 through stage 4 chronic kidney disease, or unspecified chronic kidney disease: Secondary | ICD-10-CM

## 2020-10-07 DIAGNOSIS — M65342 Trigger finger, left ring finger: Secondary | ICD-10-CM | POA: Diagnosis not present

## 2020-10-07 DIAGNOSIS — E66813 Obesity, class 3: Secondary | ICD-10-CM

## 2020-10-07 NOTE — Patient Instructions (Signed)

## 2020-10-07 NOTE — Progress Notes (Signed)
I,Tianna Badgett,acting as a Education administrator for Maximino Greenland, MD.,have documented all relevant documentation on the behalf of Maximino Greenland, MD,as directed by  Maximino Greenland, MD while in the presence of Maximino Greenland, MD.  This visit occurred during the SARS-CoV-2 public health emergency.  Safety protocols were in place, including screening questions prior to the visit, additional usage of staff PPE, and extensive cleaning of exam room while observing appropriate contact time as indicated for disinfecting solutions.  Subjective:     Patient ID: Andrew Schmitt , male    DOB: 1951-02-13 , 70 y.o.   MRN: [id number]   Chief Complaint  Patient presents with  . Hypertension    HPI  He is here today for bp check. He reports compliance with meds. He reports seeing nephrologist a year ago but does not have a follow up appointment at this time. HE denies having headaches, chest pain and shortness of breath.   Hypertension This is a chronic problem. The current episode started more than 1 year ago. The problem has been gradually improving since onset. Pertinent negatives include no blurred vision, chest pain, palpitations or shortness of breath. Risk factors for coronary artery disease include obesity, sedentary lifestyle, male gender and dyslipidemia. Past treatments include direct vasodilators.     Past Medical History:  Diagnosis Date  . BPH (benign prostatic hyperplasia)   . CKD (chronic kidney disease) stage 3, GFR 30-59 ml/min ([place]) 04/05/2019  . Erectile dysfunction   . Gout   . Gout   . Heart murmur   . Hyperlipidemia   . Hypertension   . Obesity      Family History  Problem Relation Age of Onset  . Healthy Mother   . Healthy Father      Current Outpatient Medications:  .  alfuzosin (UROXATRAL) 10 MG 24 hr tablet, Take 10 mg by mouth daily with breakfast., Disp: , Rfl:  .  allopurinol (ZYLOPRIM) 300 MG tablet, TAKE 1 TABLET BY MOUTH EVERY DAY, Disp: 30 tablet, Rfl: 5 .   amLODipine (NORVASC) 10 MG tablet, TAKE 1 TABLET BY MOUTH EVERY DAY, Disp: 90 tablet, Rfl: 2 .  aspirin EC 81 MG tablet, Take 81 mg by mouth daily., Disp: , Rfl:  .  carvedilol (COREG) 6.25 MG tablet, TAKE 1 TABLET BY MOUTH TWICE A DAY WITH FOOD, Disp: 180 tablet, Rfl: 0 .  chlorthalidone (HYGROTON) 50 MG tablet, Take 50 mg by mouth daily., Disp: , Rfl:  .  cholecalciferol (VITAMIN D) 1000 units tablet, Take 1,000 Units by mouth daily., Disp: , Rfl:  .  docusate sodium (COLACE) 100 MG capsule, Take 100 mg by mouth 2 (two) times daily., Disp: , Rfl:  .  finasteride (PROSCAR) 5 MG tablet, Take 5 mg by mouth daily., Disp: , Rfl:  .  furosemide (LASIX) 20 MG tablet, TAKE 1 TABLET BY MOUTH DAILY, Disp: 90 tablet, Rfl: 1 .  Garlic 10 MG CAPS, Take by mouth., Disp: , Rfl:  .  hydrALAZINE (APRESOLINE) 100 MG tablet, TAKE 1 TABLET (100 MG TOTAL) BY MOUTH 3 (THREE) TIMES DAILY., Disp: 90 tablet, Rfl: 0 .  ibuprofen (ADVIL) 200 MG tablet, Take 200 mg by mouth every 6 (six) hours as needed., Disp: , Rfl:  .  rosuvastatin (CRESTOR) 10 MG tablet, TAKE 1 TABLET BY MOUTH EVERY DAY, Disp: 90 tablet, Rfl: 1 .  sildenafil (REVATIO) 20 MG tablet, TAKE 1 TABLET BY MOUTH ONCE DAILY AS NEEDED, Disp: 30 tablet, Rfl: 0 .  tamsulosin (FLOMAX) 0.4 MG CAPS capsule, TAKE 1 CAPSLE BY MOUTH DAILY, Disp: 90 capsule, Rfl: 3 .  vitamin B-12 (CYANOCOBALAMIN) 500 MCG tablet, Take 500 mcg by mouth daily., Disp: , Rfl:  .  Emollient (AVEENO POSITIVELY AGELESS EX), Apply 1 tablet topically daily. (Patient not taking: Reported on 10/07/2020), Disp: , Rfl:  .  linaclotide (LINZESS) 145 MCG CAPS capsule, Take 145 mcg by mouth as needed.  (Patient not taking: Reported on 10/07/2020), Disp: , Rfl:    Allergies  Allergen Reactions  . Ace Inhibitors Cough     Review of Systems  Constitutional: Negative.   Eyes: Negative for blurred vision.  Respiratory: Negative.  Negative for shortness of breath.   Cardiovascular: Negative.  Negative  for chest pain and palpitations.  Gastrointestinal: Negative.   Musculoskeletal: Positive for arthralgias and joint swelling.       He c/o left finger getting stuck. States it is really gets stiff. Unable to identify any triggers.   Neurological: Negative.      Today's Vitals   10/07/20 1021  BP: 122/68  Pulse: 70  Temp: 98.3 F (36.8 C)  TempSrc: Oral  Weight: 284 lb 3.2 oz (128.9 kg)  Height: _0  (1.702 m)   Body mass index is 44.51 kg/m.  Wt Readings from Last 3 Encounters:  10/07/20 284 lb 3.2 oz (128.9 kg)  04/03/20 265 lb (120.2 kg)  04/03/20 265 lb 3.2 oz (120.3 kg)    Objective:  Physical Exam Vitals and nursing note reviewed.  Constitutional:      Appearance: Normal appearance. He is obese.  HENT:     Head: Normocephalic and atraumatic.     Nose:     Comments: Masked     Mouth/Throat:     Comments: Masked  Cardiovascular:     Rate and Rhythm: Normal rate and regular rhythm.     Heart sounds: Normal heart sounds.  Pulmonary:     Effort: Pulmonary effort is normal.     Breath sounds: Normal breath sounds.  Musculoskeletal:        General: Swelling present.     Comments: B/l hand swelling  Skin:    General: Skin is warm.  Neurological:     General: No focal deficit present.     Mental Status: He is alert.  Psychiatric:        Mood and Affect: Mood normal.         Assessment And Plan:     1. Benign hypertensive renal disease Comments: Chronic, well controlled. He is encouraged to limit his sodium intake. I will check renal function today.  - CMP14+EGFR  2. Stage 3 chronic kidney disease, unspecified whether stage 3a or 3b CKD ([place]) Comments: Chronic, encouraged to stay well hydrated.Importance of maintaining optimal BP control was discussed with the patient.  - Phosphorus - Parathyroid Hormone, Intact w/Ca - Protein electrophoresis, serum  3. Leukopenia, unspecified type Comments: I will recheck CBC w/ diff today. He finally agrees to  Hematology referral if persistent. I suggested this is 2021 due to concomitant anemia/thrombocytopenia. - CBC with Diff  4. Trigger finger, left ring finger Comments: Associated w/ bilateral hand swelling. I will check arthriits panel. He also agrees to referral to Hand specialist.  - Ambulatory referral to Hand Surgery - ANA, IFA (with reflex) - CYCLIC CITRUL PEPTIDE ANTIBODY, IGG/IGA - Rheumatoid factor - Sedimentation rate - Uric acid  5. Class 3 severe obesity due to excess calories with serious comorbidity and body mass  index (BMI) of 40.0 to 44.9 in adult [facility]) Comments: BMI 44. He is encouraged to initially strive for BMI less than 35 to decrease cardiac risk.  advised to exercise 30 minutes five days/week.   Patient was given opportunity to ask questions. Patient verbalized understanding of the plan and was able to repeat key elements of the plan. All questions were answered to their satisfaction.   I, Maximino Greenland, MD, have reviewed all documentation for this visit. The documentation on 10/07/20 for the exam, diagnosis, procedures, and orders are all accurate and complete.  THE PATIENT IS ENCOURAGED TO PRACTICE SOCIAL DISTANCING DUE TO THE COVID-19 PANDEMIC.

## 2020-10-08 LAB — PROTEIN ELECTROPHORESIS, SERUM
A/G Ratio: 1 (ref 0.7–1.7)
Albumin ELP: 3.5 g/dL (ref 2.9–4.4)
Alpha 1: 0.2 g/dL (ref 0.0–0.4)
Alpha 2: 0.7 g/dL (ref 0.4–1.0)
Beta: 0.9 g/dL (ref 0.7–1.3)
Gamma Globulin: 1.7 g/dL (ref 0.4–1.8)
Globulin, Total: 3.5 g/dL (ref 2.2–3.9)

## 2020-10-08 LAB — CMP14+EGFR
ALT: 21 IU/L (ref 0–44)
AST: 23 IU/L (ref 0–40)
Albumin/Globulin Ratio: 1.4 (ref 1.2–2.2)
Albumin: 4.1 g/dL (ref 3.7–4.7)
Alkaline Phosphatase: 73 IU/L (ref 44–121)
BUN/Creatinine Ratio: 23 (ref 10–24)
BUN: 46 mg/dL — ABNORMAL HIGH (ref 8–27)
Bilirubin Total: 0.4 mg/dL (ref 0.0–1.2)
CO2: 22 mmol/L (ref 20–29)
Calcium: 9.1 mg/dL (ref 8.6–10.2)
Chloride: 104 mmol/L (ref 96–106)
Creatinine, Ser: 2.04 mg/dL — ABNORMAL HIGH (ref 0.76–1.27)
GFR calc Af Amer: 37 mL/min/{1.73_m2} — ABNORMAL LOW (ref >59)
GFR calc non Af Amer: 32 mL/min/{1.73_m2} — ABNORMAL LOW (ref >59)
Globulin, Total: 2.9 g/dL (ref 1.5–4.5)
Glucose: 141 mg/dL — ABNORMAL HIGH (ref 65–99)
Potassium: 3.9 mmol/L (ref 3.5–5.2)
Sodium: 141 mmol/L (ref 134–144)
Total Protein: 7 g/dL (ref 6.0–8.5)

## 2020-10-08 LAB — PTH, INTACT AND CALCIUM: PTH: 82 pg/mL — ABNORMAL HIGH (ref 15–65)

## 2020-10-08 LAB — URIC ACID: Uric Acid: 6.4 mg/dL (ref 3.8–8.4)

## 2020-10-08 LAB — CBC WITH DIFFERENTIAL/PLATELET
Basophils Absolute: 0 10*3/uL (ref 0.0–0.2)
Basos: 0 %
EOS (ABSOLUTE): 0.2 10*3/uL (ref 0.0–0.4)
Eos: 4 %
Hematocrit: 35 % — ABNORMAL LOW (ref 37.5–51.0)
Hemoglobin: 11.4 g/dL — ABNORMAL LOW (ref 13.0–17.7)
Immature Grans (Abs): 0 10*3/uL (ref 0.0–0.1)
Immature Granulocytes: 0 %
Lymphocytes Absolute: 1.4 10*3/uL (ref 0.7–3.1)
Lymphs: 29 %
MCH: 29.9 pg (ref 26.6–33.0)
MCHC: 32.6 g/dL (ref 31.5–35.7)
MCV: 92 fL (ref 79–97)
Monocytes Absolute: 0.4 10*3/uL (ref 0.1–0.9)
Monocytes: 8 %
Neutrophils Absolute: 2.9 10*3/uL (ref 1.4–7.0)
Neutrophils: 59 %
Platelets: 113 10*3/uL — ABNORMAL LOW (ref 150–450)
RBC: 3.81 x10E6/uL — ABNORMAL LOW (ref 4.14–5.80)
RDW: 12.7 % (ref 11.6–15.4)
WBC: 5 10*3/uL (ref 3.4–10.8)

## 2020-10-08 LAB — SEDIMENTATION RATE: Sed Rate: 53 mm/hr — ABNORMAL HIGH (ref 0–30)

## 2020-10-08 LAB — ANTINUCLEAR ANTIBODIES, IFA: ANA Titer 1: NEGATIVE

## 2020-10-08 LAB — CYCLIC CITRUL PEPTIDE ANTIBODY, IGG/IGA: Cyclic Citrullin Peptide Ab: 8 units (ref 0–19)

## 2020-10-08 LAB — PHOSPHORUS: Phosphorus: 3 mg/dL (ref 2.8–4.1)

## 2020-10-08 LAB — RHEUMATOID FACTOR: Rheumatoid fact SerPl-aCnc: 13.6 IU/mL (ref <14.0)

## 2020-10-10 DIAGNOSIS — M1812 Unilateral primary osteoarthritis of first carpometacarpal joint, left hand: Secondary | ICD-10-CM | POA: Diagnosis not present

## 2020-10-10 DIAGNOSIS — M65342 Trigger finger, left ring finger: Secondary | ICD-10-CM | POA: Diagnosis not present

## 2020-10-26 ENCOUNTER — Other Ambulatory Visit: Payer: Self-pay | Admitting: Internal Medicine

## 2020-11-09 ENCOUNTER — Other Ambulatory Visit: Payer: Self-pay | Admitting: Internal Medicine

## 2020-11-17 ENCOUNTER — Other Ambulatory Visit: Payer: Self-pay | Admitting: Internal Medicine

## 2021-01-22 ENCOUNTER — Other Ambulatory Visit: Payer: Self-pay | Admitting: Internal Medicine

## 2021-02-06 ENCOUNTER — Other Ambulatory Visit: Payer: Self-pay | Admitting: Internal Medicine

## 2021-02-11 ENCOUNTER — Other Ambulatory Visit: Payer: Self-pay | Admitting: Internal Medicine

## 2021-03-04 DIAGNOSIS — N401 Enlarged prostate with lower urinary tract symptoms: Secondary | ICD-10-CM | POA: Diagnosis not present

## 2021-03-04 DIAGNOSIS — N5201 Erectile dysfunction due to arterial insufficiency: Secondary | ICD-10-CM | POA: Diagnosis not present

## 2021-03-04 DIAGNOSIS — R3911 Hesitancy of micturition: Secondary | ICD-10-CM | POA: Diagnosis not present

## 2021-03-06 DIAGNOSIS — I8311 Varicose veins of right lower extremity with inflammation: Secondary | ICD-10-CM | POA: Diagnosis not present

## 2021-03-06 DIAGNOSIS — I8312 Varicose veins of left lower extremity with inflammation: Secondary | ICD-10-CM | POA: Diagnosis not present

## 2021-03-10 ENCOUNTER — Other Ambulatory Visit: Payer: Self-pay | Admitting: Internal Medicine

## 2021-04-02 DIAGNOSIS — K219 Gastro-esophageal reflux disease without esophagitis: Secondary | ICD-10-CM | POA: Diagnosis not present

## 2021-04-02 DIAGNOSIS — I4891 Unspecified atrial fibrillation: Secondary | ICD-10-CM | POA: Diagnosis not present

## 2021-04-02 DIAGNOSIS — I129 Hypertensive chronic kidney disease with stage 1 through stage 4 chronic kidney disease, or unspecified chronic kidney disease: Secondary | ICD-10-CM | POA: Diagnosis not present

## 2021-04-02 DIAGNOSIS — I739 Peripheral vascular disease, unspecified: Secondary | ICD-10-CM | POA: Diagnosis not present

## 2021-04-02 DIAGNOSIS — E785 Hyperlipidemia, unspecified: Secondary | ICD-10-CM | POA: Diagnosis not present

## 2021-04-02 DIAGNOSIS — D6869 Other thrombophilia: Secondary | ICD-10-CM | POA: Diagnosis not present

## 2021-04-02 DIAGNOSIS — Z6841 Body Mass Index (BMI) 40.0 and over, adult: Secondary | ICD-10-CM | POA: Diagnosis not present

## 2021-04-02 DIAGNOSIS — K5900 Constipation, unspecified: Secondary | ICD-10-CM | POA: Diagnosis not present

## 2021-04-08 DIAGNOSIS — I8312 Varicose veins of left lower extremity with inflammation: Secondary | ICD-10-CM | POA: Diagnosis not present

## 2021-04-09 ENCOUNTER — Encounter: Payer: Self-pay | Admitting: Internal Medicine

## 2021-04-09 ENCOUNTER — Other Ambulatory Visit: Payer: Self-pay

## 2021-04-09 ENCOUNTER — Ambulatory Visit (INDEPENDENT_AMBULATORY_CARE_PROVIDER_SITE_OTHER): Payer: Medicare PPO | Admitting: Internal Medicine

## 2021-04-09 VITALS — BP 130/70 | HR 80 | Temp 98.2°F | Ht 66.2 in | Wt 279.2 lb

## 2021-04-09 DIAGNOSIS — H6123 Impacted cerumen, bilateral: Secondary | ICD-10-CM | POA: Diagnosis not present

## 2021-04-09 DIAGNOSIS — I129 Hypertensive chronic kidney disease with stage 1 through stage 4 chronic kidney disease, or unspecified chronic kidney disease: Secondary | ICD-10-CM | POA: Diagnosis not present

## 2021-04-09 DIAGNOSIS — Z0000 Encounter for general adult medical examination without abnormal findings: Secondary | ICD-10-CM | POA: Diagnosis not present

## 2021-04-09 DIAGNOSIS — N183 Chronic kidney disease, stage 3 unspecified: Secondary | ICD-10-CM

## 2021-04-09 DIAGNOSIS — N1832 Chronic kidney disease, stage 3b: Secondary | ICD-10-CM | POA: Diagnosis not present

## 2021-04-09 DIAGNOSIS — R7309 Other abnormal glucose: Secondary | ICD-10-CM

## 2021-04-09 DIAGNOSIS — Z6841 Body Mass Index (BMI) 40.0 and over, adult: Secondary | ICD-10-CM | POA: Diagnosis not present

## 2021-04-09 DIAGNOSIS — L03115 Cellulitis of right lower limb: Secondary | ICD-10-CM

## 2021-04-09 DIAGNOSIS — M1A30X0 Chronic gout due to renal impairment, unspecified site, without tophus (tophi): Secondary | ICD-10-CM

## 2021-04-09 LAB — POCT URINALYSIS DIPSTICK
Bilirubin, UA: NEGATIVE
Blood, UA: NEGATIVE
Glucose, UA: NEGATIVE
Ketones, UA: NEGATIVE
Leukocytes, UA: NEGATIVE
Nitrite, UA: NEGATIVE
Protein, UA: POSITIVE — AB
Spec Grav, UA: 1.02 (ref 1.010–1.025)
Urobilinogen, UA: 0.2 E.U./dL
pH, UA: 6 (ref 5.0–8.0)

## 2021-04-09 LAB — POCT UA - MICROALBUMIN
Albumin/Creatinine Ratio, Urine, POC: 300
Creatinine, POC: 200 mg/dL
Microalbumin Ur, POC: 150 mg/L

## 2021-04-09 NOTE — Progress Notes (Signed)
I,Tianna Badgett,acting as a Education administrator for Maximino Greenland, MD.,have documented all relevant documentation on the behalf of Maximino Greenland, MD,as directed by  Maximino Greenland, MD while in the presence of Maximino Greenland, MD.  This visit occurred during the SARS-CoV-2 public health emergency.  Safety protocols were in place, including screening questions prior to the visit, additional usage of staff PPE, and extensive cleaning of exam room while observing appropriate contact time as indicated for disinfecting solutions.  Subjective:     Patient ID: Andrew Schmitt , male    DOB: [birth year] , 70 y.o.   MRN: [id number]   Chief Complaint  Patient presents with   Annual Exam   Hypertension    HPI  Patient presents for full physical. He does not have any complaints at this time. He states that he is compliant with medication. He is followed by Dr Woody Seller as his cardiologist. He sees Dr Avie Echevaria at [facility].   Hypertension This is a chronic problem. The current episode started more than 1 year ago. The problem has been gradually worsening since onset. The problem is uncontrolled. Pertinent negatives include no anxiety. There are no associated agents to hypertension. Risk factors for coronary artery disease include sedentary lifestyle, obesity, male gender, dyslipidemia and diabetes mellitus. Past treatments include calcium channel blockers and diuretics. The current treatment provides no improvement. There are no compliance problems.  There is no history of angina or kidney disease. There is no history of chronic renal disease.    Past Medical History:  Diagnosis Date   BPH (benign prostatic hyperplasia)    CKD (chronic kidney disease) stage 3, GFR 30-59 ml/min ([place]) 04/05/2019   Erectile dysfunction    Gout    Gout    Heart murmur    Hyperlipidemia    Hypertension    Obesity      Family History  Problem Relation Age of Onset   Healthy Mother    Healthy Father      Current  Outpatient Medications:    allopurinol (ZYLOPRIM) 300 MG tablet, TAKE 1 TABLET BY MOUTH EVERY DAY, Disp: 30 tablet, Rfl: 5   amLODipine (NORVASC) 10 MG tablet, TAKE 1 TABLET BY MOUTH EVERY DAY, Disp: 90 tablet, Rfl: 2   aspirin EC 81 MG tablet, Take 81 mg by mouth daily., Disp: , Rfl:    carvedilol (COREG) 6.25 MG tablet, TAKE 1 TABLET BY MOUTH TWICE A DAY WITH FOOD, Disp: 180 tablet, Rfl: 0   chlorthalidone (HYGROTON) 50 MG tablet, Take 50 mg by mouth daily., Disp: , Rfl:    cholecalciferol (VITAMIN D) 1000 units tablet, Take 1,000 Units by mouth daily., Disp: , Rfl:    docusate sodium (COLACE) 100 MG capsule, Take 100 mg by mouth 2 (two) times daily., Disp: , Rfl:    Emollient (AVEENO POSITIVELY AGELESS EX), Apply 1 tablet topically daily., Disp: , Rfl:    finasteride (PROSCAR) 5 MG tablet, Take 5 mg by mouth daily., Disp: , Rfl:    furosemide (LASIX) 20 MG tablet, TAKE 1 TABLET BY MOUTH EVERY DAY, Disp: 90 tablet, Rfl: 1   Garlic 10 MG CAPS, Take by mouth., Disp: , Rfl:    hydrALAZINE (APRESOLINE) 100 MG tablet, TAKE 1 TABLET (100 MG TOTAL) BY MOUTH 3 (THREE) TIMES DAILY., Disp: 90 tablet, Rfl: 0   ibuprofen (ADVIL) 200 MG tablet, Take 200 mg by mouth every 6 (six) hours as needed., Disp: , Rfl:    rosuvastatin (CRESTOR) 10 MG  tablet, TAKE 1 TABLET BY MOUTH EVERY DAY, Disp: 90 tablet, Rfl: 1   sildenafil (REVATIO) 20 MG tablet, TAKE 1 TABLET BY MOUTH ONCE DAILY AS NEEDED, Disp: 30 tablet, Rfl: 3   tamsulosin (FLOMAX) 0.4 MG CAPS capsule, TAKE 1 CAPSLE BY MOUTH DAILY, Disp: 90 capsule, Rfl: 3   vitamin B-12 (CYANOCOBALAMIN) 500 MCG tablet, Take 500 mcg by mouth daily., Disp: , Rfl:    cephALEXin (KEFLEX) 500 MG capsule, Take 1 capsule (500 mg total) by mouth 3 (three) times daily for 10 days., Disp: 30 capsule, Rfl: 0   Allergies  Allergen Reactions   Ace Inhibitors Cough     Men's preventive visit. Patient Health Questionnaire (PHQ-2) is  [place] [facility] Visit from 04/09/2021 in  [facility]  PHQ-2 Total Score 0     . Patient is on a heart healthy diet. Marital status: Married. Relevant history for alcohol use is:  Social History   Substance and Sexual Activity  Alcohol Use No  . Relevant history for tobacco use is:  Social History   Tobacco Use  Smoking Status Never  Smokeless Tobacco Never  .   Review of Systems  Constitutional: Negative.   HENT: Negative.    Eyes: Negative.   Respiratory: Negative.    Cardiovascular: Negative.   Gastrointestinal: Negative.   Endocrine: Negative.   Genitourinary: Negative.   Musculoskeletal: Negative.   Skin:  Positive for wound.  Allergic/Immunologic: Negative.   Neurological: Negative.   Hematological: Negative.   Psychiatric/Behavioral: Negative.      Today's Vitals   04/09/21 0947  BP: 130/70  Pulse: 80  Temp: 98.2 F (36.8 C)  TempSrc: Oral  Weight: 279 lb 3.2 oz (126.6 kg)  Height: 5' 6.2" (1.681 m)   Body mass index is 44.79 kg/m.  Wt Readings from Last 3 Encounters:  04/09/21 279 lb 3.2 oz (126.6 kg)  10/07/20 284 lb 3.2 oz (128.9 kg)  04/03/20 265 lb (120.2 kg)    Objective:  Physical Exam Vitals and nursing note reviewed.  Constitutional:      Appearance: Normal appearance. He is obese.  HENT:     Head: Normocephalic and atraumatic.     Right Ear: Ear canal and external ear normal. There is impacted cerumen.     Left Ear: Ear canal and external ear normal. There is impacted cerumen.     Nose:     Comments: Masked     Mouth/Throat:     Comments: Masked  Eyes:     Extraocular Movements: Extraocular movements intact.     Conjunctiva/sclera: Conjunctivae normal.     Pupils: Pupils are equal, round, and reactive to light.  Cardiovascular:     Rate and Rhythm: Normal rate and regular rhythm.     Pulses: Normal pulses.     Heart sounds: Normal heart sounds.  Pulmonary:     Effort: Pulmonary effort is normal.     Breath sounds: Normal breath sounds.  Chest:   Breasts:    Right: Normal. No swelling, bleeding, inverted nipple, mass or nipple discharge.     Left: Normal. No swelling, bleeding, inverted nipple, mass or nipple discharge.  Abdominal:     General: Bowel sounds are normal.     Palpations: Abdomen is soft.     Comments: Rounded, soft.   Genitourinary:    Comments: Deferred  Musculoskeletal:        General: Normal range of motion.     Cervical back: Normal range of  motion and neck supple.     Right lower leg: Edema present.     Left lower leg: Edema present.  Skin:    General: Skin is warm.  Neurological:     General: No focal deficit present.     Mental Status: He is alert.  Psychiatric:        Mood and Affect: Mood normal.        Behavior: Behavior normal.        Assessment And Plan:    1. Routine general medical examination at a health care facility Comments: A full exam was performed.  DRE not performed. PATIENT IS ADVISED TO GET 30-45 MINUTES REGULAR EXERCISE NO LESS THAN FOUR TO FIVE DAYS PER WEEK - BOTH WEIGHTBEARING EXERCISES AND AEROBIC ARE RECOMMENDED.  PATIENT IS ADVISED TO FOLLOW A HEALTHY DIET WITH AT LEAST SIX FRUITS/VEGGIES PER DAY, DECREASE INTAKE OF RED MEAT, AND TO INCREASE FISH INTAKE TO TWO DAYS PER WEEK.  MEATS/FISH SHOULD NOT BE FRIED, BAKED OR BROILED IS PREFERABLE.  IT IS ALSO IMPORTANT TO CUT BACK ON YOUR SUGAR INTAKE. PLEASE AVOID ANYTHING WITH ADDED SUGAR, CORN SYRUP OR OTHER SWEETENERS. IF YOU MUST USE A SWEETENER, YOU CAN TRY STEVIA. IT IS ALSO IMPORTANT TO AVOID ARTIFICIALLY SWEETENERS AND DIET BEVERAGES. LASTLY, I SUGGEST WEARING SPF 50 SUNSCREEN ON EXPOSED PARTS AND ESPECIALLY WHEN IN THE DIRECT SUNLIGHT FOR AN EXTENDED PERIOD OF TIME.  PLEASE AVOID FAST FOOD RESTAURANTS AND INCREASE YOUR WATER INTAKE.   2. Benign hypertensive renal disease Comments: Chronic, controlled. EKG performed, NSR, 1AVB, incomplete RBBB and low voltage. Advised to follow low sodium diet. He will rto in 4-6 months for  re-evaluation.  - POCT Urinalysis Dipstick (81002) - POCT UA - Microalbumin - EKG 12-Lead - CBC - CMP14+EGFR - Lipid panel  3. Stage 3b chronic kidney disease (HCC) Comments: Chronic, also followed by Renal. Encouraged to follow dietary recommendations.   4. Bilateral impacted cerumen AFTER OBTAINING VERBAL CONSENT, BOTH EARS WERE FLUSHED BY IRRIGATION. HE TOLERATED PROCEDURE WELL WITHOUT ANY COMPLICATIONS. NO TM ABNORMALITIES WERE NOTED. - Ear Lavage  5. Cellulitis of right lower extremity Comments: I will send in rx Keflex 500mg  tid x 7 days. He is encouraged to complete full abx course and to elevate leg when seated.   6. Chronic gout due to renal impairment without tophus, unspecified site Comments: Chronic, I will check uric acid level today. I will make further recommendations once his labs are available for review.  - Uric acid  7. Other abnormal glucose Comments: His a1c has been elevated in the past. I will recheck this today. Encouraged to limit his intake of sweetened beverages AND to increase exercise.  - Hemoglobin A1c - Insulin, random(561)  8. Class 3 severe obesity due to excess calories with serious comorbidity and body mass index (BMI) of 40.0 to 44.9 in adult [facility]) He is encouraged to initially strive for BMI less than 30 to decrease cardiac risk. He is advised to exercise no less than 150 minutes per week.   Patient was given opportunity to ask questions. Patient verbalized understanding of the plan and was able to repeat key elements of the plan. All questions were answered to their satisfaction.   I, [facility], MD, have reviewed all documentation for this visit. The documentation on 04/09/21 for the exam, diagnosis, procedures, and orders are all accurate and complete.   THE PATIENT IS ENCOURAGED TO PRACTICE SOCIAL DISTANCING DUE TO THE COVID-19 PANDEMIC.

## 2021-04-09 NOTE — Patient Instructions (Signed)
Health Maintenance, Male Adopting a healthy lifestyle and getting preventive care are important in promoting health and wellness. Ask your health care provider about: The right schedule for you to have regular tests and exams. Things you can do on your own to prevent diseases and keep yourself healthy. What should I know about diet, weight, and exercise? Eat a healthy diet  Eat a diet that includes plenty of vegetables, fruits, low-fat dairy products, and lean protein. Do not eat a lot of foods that are high in solid fats, added sugars, or sodium.  Maintain a healthy weight Body mass index (BMI) is a measurement that can be used to identify possible weight problems. It estimates body fat based on height and weight. Your health care provider can help determine your BMI and help you achieve or maintain ahealthy weight. Get regular exercise Get regular exercise. This is one of the most important things you can do for your health. Most adults should: Exercise for at least 150 minutes each week. The exercise should increase your heart rate and make you sweat (moderate-intensity exercise). Do strengthening exercises at least twice a week. This is in addition to the moderate-intensity exercise. Spend less time sitting. Even light physical activity can be beneficial. Watch cholesterol and blood lipids Have your blood tested for lipids and cholesterol at 72 years of age, then havethis test every 5 years. You may need to have your cholesterol levels checked more often if: Your lipid or cholesterol levels are high. You are older than 72 years of age. You are at high risk for heart disease. What should I know about cancer screening? Many types of cancers can be detected early and may often be prevented. Depending on your health history and family history, you may need to have cancer screening at various ages. This may include screening for: Colorectal cancer. Prostate cancer. Skin cancer. Lung  cancer. What should I know about heart disease, diabetes, and high blood pressure? Blood pressure and heart disease High blood pressure causes heart disease and increases the risk of stroke. This is more likely to develop in people who have high blood pressure readings, are of African descent, or are overweight. Talk with your health care provider about your target blood pressure readings. Have your blood pressure checked: Every 3-5 years if you are 18-39 years of age. Every year if you are 40 years old or older. If you are between the ages of 65 and 75 and are a current or former smoker, ask your health care provider if you should have a one-time screening for abdominal aortic aneurysm (AAA). Diabetes Have regular diabetes screenings. This checks your fasting blood sugar level. Have the screening done: Once every three years after age 45 if you are at a normal weight and have a low risk for diabetes. More often and at a younger age if you are overweight or have a high risk for diabetes. What should I know about preventing infection? Hepatitis B If you have a higher risk for hepatitis B, you should be screened for this virus. Talk with your health care provider to find out if you are at risk forhepatitis B infection. Hepatitis C Blood testing is recommended for: Everyone born from 1945 through 1965. Anyone with known risk factors for hepatitis C. Sexually transmitted infections (STIs) You should be screened each year for STIs, including gonorrhea and chlamydia, if: You are sexually active and are younger than 72 years of age. You are older than 72 years of age   and your health care provider tells you that you are at risk for this type of infection. Your sexual activity has changed since you were last screened, and you are at increased risk for chlamydia or gonorrhea. Ask your health care provider if you are at risk. Ask your health care provider about whether you are at high risk for HIV.  Your health care provider may recommend a prescription medicine to help prevent HIV infection. If you choose to take medicine to prevent HIV, you should first get tested for HIV. You should then be tested every 3 months for as long as you are taking the medicine. Follow these instructions at home: Lifestyle Do not use any products that contain nicotine or tobacco, such as cigarettes, e-cigarettes, and chewing tobacco. If you need help quitting, ask your health care provider. Do not use street drugs. Do not share needles. Ask your health care provider for help if you need support or information about quitting drugs. Alcohol use Do not drink alcohol if your health care provider tells you not to drink. If you drink alcohol: Limit how much you have to 0-2 drinks a day. Be aware of how much alcohol is in your drink. In the [place] one drink equals one 12 oz bottle of beer (355 mL), one 5 oz glass of wine (148 mL), or one 1 oz glass of hard liquor (44 mL). General instructions Schedule regular health, dental, and eye exams. Stay current with your vaccines. Tell your health care provider if: You often feel depressed. You have ever been abused or do not feel safe at home. Summary Adopting a healthy lifestyle and getting preventive care are important in promoting health and wellness. Follow your health care provider's instructions about healthy diet, exercising, and getting tested or screened for diseases. Follow your health care provider's instructions on monitoring your cholesterol and blood pressure. This information is not intended to replace advice given to you by your health care provider. Make sure you discuss any questions you have with your healthcare provider. Document Revised: 07/26/2018 Document Reviewed: 07/26/2018 Elsevier Patient Education  2022 Elsevier Inc.  

## 2021-04-10 ENCOUNTER — Other Ambulatory Visit: Payer: Self-pay | Admitting: Internal Medicine

## 2021-04-10 DIAGNOSIS — K5901 Slow transit constipation: Secondary | ICD-10-CM | POA: Diagnosis not present

## 2021-04-10 DIAGNOSIS — Z86010 Personal history of colonic polyps: Secondary | ICD-10-CM | POA: Diagnosis not present

## 2021-04-10 LAB — CMP14+EGFR
ALT: 20 IU/L (ref 0–44)
AST: 23 IU/L (ref 0–40)
Albumin/Globulin Ratio: 1.1 — ABNORMAL LOW (ref 1.2–2.2)
Albumin: 4.4 g/dL (ref 3.7–4.7)
Alkaline Phosphatase: 82 IU/L (ref 44–121)
BUN/Creatinine Ratio: 23 (ref 10–24)
BUN: 46 mg/dL — ABNORMAL HIGH (ref 8–27)
Bilirubin Total: 0.4 mg/dL (ref 0.0–1.2)
CO2: 24 mmol/L (ref 20–29)
Calcium: 9.6 mg/dL (ref 8.6–10.2)
Chloride: 102 mmol/L (ref 96–106)
Creatinine, Ser: 1.97 mg/dL — ABNORMAL HIGH (ref 0.76–1.27)
Globulin, Total: 4.1 g/dL (ref 1.5–4.5)
Glucose: 121 mg/dL — ABNORMAL HIGH (ref 65–99)
Potassium: 4 mmol/L (ref 3.5–5.2)
Sodium: 141 mmol/L (ref 134–144)
Total Protein: 8.5 g/dL (ref 6.0–8.5)
eGFR: 35 mL/min/{1.73_m2} — ABNORMAL LOW (ref >59)

## 2021-04-10 LAB — CBC
Hematocrit: 36.3 % — ABNORMAL LOW (ref 37.5–51.0)
Hemoglobin: 11.6 g/dL — ABNORMAL LOW (ref 13.0–17.7)
MCH: 29.1 pg (ref 26.6–33.0)
MCHC: 32 g/dL (ref 31.5–35.7)
MCV: 91 fL (ref 79–97)
Platelets: 120 10*3/uL — ABNORMAL LOW (ref 150–450)
RBC: 3.98 x10E6/uL — ABNORMAL LOW (ref 4.14–5.80)
RDW: 12.1 % (ref 11.6–15.4)
WBC: 4.1 10*3/uL (ref 3.4–10.8)

## 2021-04-10 LAB — HEMOGLOBIN A1C
Est. average glucose Bld gHb Est-mCnc: 108 mg/dL
Hgb A1c MFr Bld: 5.4 % (ref 4.8–5.6)

## 2021-04-10 LAB — LIPID PANEL
Chol/HDL Ratio: 3.7 ratio (ref 0.0–5.0)
Cholesterol, Total: 152 mg/dL (ref 100–199)
HDL: 41 mg/dL (ref >39)
LDL Chol Calc (NIH): 81 mg/dL (ref 0–99)
Triglycerides: 177 mg/dL — ABNORMAL HIGH (ref 0–149)
VLDL Cholesterol Cal: 30 mg/dL (ref 5–40)

## 2021-04-10 LAB — INSULIN, RANDOM: INSULIN: 23.3 u[IU]/mL (ref 2.6–24.9)

## 2021-04-10 LAB — URIC ACID: Uric Acid: 7.4 mg/dL (ref 3.8–8.4)

## 2021-04-10 MED ORDER — CEPHALEXIN 500 MG PO CAPS: 500.0000 mg | ORAL_CAPSULE | Freq: Three times a day (TID) | ORAL | 0 refills | Status: AC

## 2021-04-21 DIAGNOSIS — N2581 Secondary hyperparathyroidism of renal origin: Secondary | ICD-10-CM | POA: Diagnosis not present

## 2021-04-21 DIAGNOSIS — N1832 Chronic kidney disease, stage 3b: Secondary | ICD-10-CM | POA: Diagnosis not present

## 2021-04-21 DIAGNOSIS — I129 Hypertensive chronic kidney disease with stage 1 through stage 4 chronic kidney disease, or unspecified chronic kidney disease: Secondary | ICD-10-CM | POA: Diagnosis not present

## 2021-04-21 DIAGNOSIS — R809 Proteinuria, unspecified: Secondary | ICD-10-CM | POA: Diagnosis not present

## 2021-04-23 ENCOUNTER — Ambulatory Visit (INDEPENDENT_AMBULATORY_CARE_PROVIDER_SITE_OTHER): Payer: Medicare PPO

## 2021-04-23 ENCOUNTER — Other Ambulatory Visit: Payer: Self-pay

## 2021-04-23 VITALS — BP 136/70 | HR 74 | Temp 98.0°F | Ht 67.0 in | Wt 281.2 lb

## 2021-04-23 DIAGNOSIS — Z0000 Encounter for general adult medical examination without abnormal findings: Secondary | ICD-10-CM

## 2021-04-23 NOTE — Patient Instructions (Signed)
Andrew Schmitt , Thank you for taking time to come for your Medicare Wellness Visit. I appreciate your ongoing commitment to your health goals. Please review the following plan we discussed and let me know if I can assist you in the future.   Screening recommendations/referrals: Colonoscopy: scheduled 05/04/2021 Recommended yearly ophthalmology/optometry visit for glaucoma screening and checkup Recommended yearly dental visit for hygiene and checkup  Vaccinations: Influenza vaccine: decline Pneumococcal vaccine: completed 06/23/2016 Tdap vaccine: completed 08/06/2011 Shingles vaccine: decline   Covid-19: 09/21/2019, 08/24/2019  Advanced directives: Please bring a copy of your POA (Power of Attorney) and/or Living Will to your next appointment.   Conditions/risks identified: none  Next appointment: Follow up in one year for your annual wellness visit.   Preventive Care 72 Years and Older, Male Preventive care refers to lifestyle choices and visits with your health care provider that can promote health and wellness. What does preventive care include? A yearly physical exam. This is also called an annual well check. Dental exams once or twice a year. Routine eye exams. Ask your health care provider how often you should have your eyes checked. Personal lifestyle choices, including: Daily care of your teeth and gums. Regular physical activity. Eating a healthy diet. Avoiding tobacco and drug use. Limiting alcohol use. Practicing safe sex. Taking low doses of aspirin every day. Taking vitamin and mineral supplements as recommended by your health care provider. What happens during an annual well check? The services and screenings done by your health care provider during your annual well check will depend on your age, overall health, lifestyle risk factors, and family history of disease. Counseling  Your health care provider may ask you questions about your: Alcohol use. Tobacco use. Drug  use. Emotional well-being. Home and relationship well-being. Sexual activity. Eating habits. History of falls. Memory and ability to understand (cognition). Work and work Statistician. Screening  You may have the following tests or measurements: Height, weight, and BMI. Blood pressure. Lipid and cholesterol levels. These may be checked every 5 years, or more frequently if you are over 36 years old. Skin check. Lung cancer screening. You may have this screening every year starting at age 58 if you have a 30-pack-year history of smoking and currently smoke or have quit within the past 15 years. Fecal occult blood test (FOBT) of the stool. You may have this test every year starting at age 39. Flexible sigmoidoscopy or colonoscopy. You may have a sigmoidoscopy every 5 years or a colonoscopy every 10 years starting at age 50. Prostate cancer screening. Recommendations will vary depending on your family history and other risks. Hepatitis C blood test. Hepatitis B blood test. Sexually transmitted disease (STD) testing. Diabetes screening. This is done by checking your blood sugar (glucose) after you have not eaten for a while (fasting). You may have this done every 1-3 years. Abdominal aortic aneurysm (AAA) screening. You may need this if you are a current or former smoker. Osteoporosis. You may be screened starting at age 68 if you are at high risk. Talk with your health care provider about your test results, treatment options, and if necessary, the need for more tests. Vaccines  Your health care provider may recommend certain vaccines, such as: Influenza vaccine. This is recommended every year. Tetanus, diphtheria, and acellular pertussis (Tdap, Td) vaccine. You may need a Td booster every 10 years. Zoster vaccine. You may need this after age 49. Pneumococcal 13-valent conjugate (PCV13) vaccine. One dose is recommended after age 5. Pneumococcal polysaccharide (  PPSV23) vaccine. One dose is  recommended after age 41. Talk to your health care provider about which screenings and vaccines you need and how often you need them. This information is not intended to replace advice given to you by your health care provider. Make sure you discuss any questions you have with your health care provider. Document Released: 08/29/2015 Document Revised: 04/21/2016 Document Reviewed: 06/03/2015 Elsevier Interactive Patient Education  2017 [place] Prevention in the Home Falls can cause injuries. They can happen to people of all ages. There are many things you can do to make your home safe and to help prevent falls. What can I do on the outside of my home? Regularly fix the edges of walkways and driveways and fix any cracks. Remove anything that might make you trip as you walk through a door, such as a raised step or threshold. Trim any bushes or trees on the path to your home. Use bright outdoor lighting. Clear any walking paths of anything that might make someone trip, such as rocks or tools. Regularly check to see if handrails are loose or broken. Make sure that both sides of any steps have handrails. Any raised decks and porches should have guardrails on the edges. Have any leaves, snow, or ice cleared regularly. Use sand or salt on walking paths during winter. Clean up any spills in your garage right away. This includes oil or grease spills. What can I do in the bathroom? Use night lights. Install grab bars by the toilet and in the tub and shower. Do not use towel bars as grab bars. Use non-skid mats or decals in the tub or shower. If you need to sit down in the shower, use a plastic, non-slip stool. Keep the floor dry. Clean up any water that spills on the floor as soon as it happens. Remove soap buildup in the tub or shower regularly. Attach bath mats securely with double-sided non-slip rug tape. Do not have throw rugs and other things on the floor that can make you  trip. What can I do in the bedroom? Use night lights. Make sure that you have a light by your bed that is easy to reach. Do not use any sheets or blankets that are too big for your bed. They should not hang down onto the floor. Have a firm chair that has side arms. You can use this for support while you get dressed. Do not have throw rugs and other things on the floor that can make you trip. What can I do in the kitchen? Clean up any spills right away. Avoid walking on wet floors. Keep items that you use a lot in easy-to-reach places. If you need to reach something above you, use a strong step stool that has a grab bar. Keep electrical cords out of the way. Do not use floor polish or wax that makes floors slippery. If you must use wax, use non-skid floor wax. Do not have throw rugs and other things on the floor that can make you trip. What can I do with my stairs? Do not leave any items on the stairs. Make sure that there are handrails on both sides of the stairs and use them. Fix handrails that are broken or loose. Make sure that handrails are as long as the stairways. Check any carpeting to make sure that it is firmly attached to the stairs. Fix any carpet that is loose or worn. Avoid having throw rugs at the top or  bottom of the stairs. If you do have throw rugs, attach them to the floor with carpet tape. Make sure that you have a light switch at the top of the stairs and the bottom of the stairs. If you do not have them, ask someone to add them for you. What else can I do to help prevent falls? Wear shoes that: Do not have high heels. Have rubber bottoms. Are comfortable and fit you well. Are closed at the toe. Do not wear sandals. If you use a stepladder: Make sure that it is fully opened. Do not climb a closed stepladder. Make sure that both sides of the stepladder are locked into place. Ask someone to hold it for you, if possible. Clearly mark and make sure that you can  see: Any grab bars or handrails. First and last steps. Where the edge of each step is. Use tools that help you move around (mobility aids) if they are needed. These include: Canes. Walkers. Scooters. Crutches. Turn on the lights when you go into a dark area. Replace any light bulbs as soon as they burn out. Set up your furniture so you have a clear path. Avoid moving your furniture around. If any of your floors are uneven, fix them. If there are any pets around you, be aware of where they are. Review your medicines with your doctor. Some medicines can make you feel dizzy. This can increase your chance of falling. Ask your doctor what other things that you can do to help prevent falls. This information is not intended to replace advice given to you by your health care provider. Make sure you discuss any questions you have with your health care provider. Document Released: 05/29/2009 Document Revised: 01/08/2016 Document Reviewed: 09/06/2014 Elsevier Interactive Patient Education  2017 [employer].

## 2021-04-23 NOTE — Progress Notes (Signed)
This visit occurred during the SARS-CoV-2 public health emergency.  Safety protocols were in place, including screening questions prior to the visit, additional usage of staff PPE, and extensive cleaning of exam room while observing appropriate contact time as indicated for disinfecting solutions.  Subjective:   Andrew Schmitt is a 70 y.o. male who presents for Medicare Annual/Subsequent preventive examination.  Review of Systems     Cardiac Risk Factors include: advanced age (>50mn, >>33women);dyslipidemia;hypertension;male gender;obesity (BMI >30kg/m2);sedentary lifestyle     Objective:    Today's Vitals   04/23/21 0853  BP: 136/70  Pulse: 74  Temp: 98 F (36.7 C)  TempSrc: Oral  SpO2: 96%  Weight: 281 lb 3.2 oz (127.6 kg)  Height: '5\' 7"'$  (1.702 m)   Body mass index is 44.04 kg/m.  Advanced Directives 04/23/2021 04/03/2020 03/29/2019 11/02/2016  Does Patient Have a Medical Advance Directive? Yes Yes Yes No  Type of [place]amed[place]of [place] will [place] will -  Copy of [place] Chart? No - copy requested No - copy requested No - copy requested -  Would patient like information on creating a medical advance directive? - - - No - Patient declined    Current Medications (verified) Outpatient Encounter Medications as of 04/23/2021  Medication Sig   allopurinol (ZYLOPRIM) 300 MG tablet TAKE 1 TABLET BY MOUTH EVERY DAY   amLODipine (NORVASC) 10 MG tablet TAKE 1 TABLET BY MOUTH EVERY DAY   aspirin EC 81 MG tablet Take 81 mg by mouth daily.   carvedilol (COREG) 6.25 MG tablet TAKE 1 TABLET BY MOUTH TWICE A DAY WITH FOOD   chlorthalidone (HYGROTON) 50 MG tablet Take 50 mg by mouth daily.   cholecalciferol (VITAMIN D) 1000 units tablet Take 1,000 Units by mouth daily.   docusate sodium (COLACE) 100 MG capsule Take 100 mg by mouth 2 (two) times daily.   Emollient  (AVEENO POSITIVELY AGELESS EX) Apply 1 tablet topically daily.   finasteride (PROSCAR) 5 MG tablet Take 5 mg by mouth daily.   furosemide (LASIX) 20 MG tablet TAKE 1 TABLET BY MOUTH EVERY DAY   Garlic 10 MG CAPS Take by mouth.   hydrALAZINE (APRESOLINE) 100 MG tablet TAKE 1 TABLET (100 MG TOTAL) BY MOUTH 3 (THREE) TIMES DAILY.   ibuprofen (ADVIL) 200 MG tablet Take 200 mg by mouth every 6 (six) hours as needed.   rosuvastatin (CRESTOR) 10 MG tablet TAKE 1 TABLET BY MOUTH EVERY DAY   sildenafil (REVATIO) 20 MG tablet TAKE 1 TABLET BY MOUTH ONCE DAILY AS NEEDED   tamsulosin (FLOMAX) 0.4 MG CAPS capsule TAKE 1 CAPSLE BY MOUTH DAILY   vitamin B-12 (CYANOCOBALAMIN) 500 MCG tablet Take 500 mcg by mouth daily.   No facility-administered encounter medications on file as of 04/23/2021.    Allergies (verified) Ace inhibitors   History: Past Medical History:  Diagnosis Date   BPH (benign prostatic hyperplasia)    CKD (chronic kidney disease) stage 3, [facility] 30-[facility]   Erectile dysfunction    Gout    Gout    Heart murmur    Hyperlipidemia    Hypertension    Obesity    Past Surgical History:  Procedure Laterality Date   CARDIAC SURGERY     Family History  Problem Relation Age of Onset   Healthy Mother    Healthy Father    Social History   Socioeconomic History  Marital status: Married    Spouse name: Not on file   Number of children: 4   Years of education: Not on file   Highest education level: Not on file  Occupational History   Occupation: retired  Tobacco Use   Smoking status: Never   Smokeless tobacco: Never  Vaping Use   Vaping Use: Never used  Substance and Sexual Activity   Alcohol use: No   Drug use: No   Sexual activity: Yes  Other Topics Concern   Not on file  Social History Narrative   Not on file   Social Determinants of Health   Financial Resource Strain: Low Risk    Difficulty of Paying Living Expenses: Not hard at all  Food  Insecurity: No Food Insecurity   Worried About Charity fundraiser in the Last Year: Never true   [place] in the Last Year: Never true  Transportation Needs: No Transportation Needs   Lack of Transportation (Medical): No   Lack of Transportation (Non-Medical): No  Physical Activity: Inactive   Days of Exercise per Week: 0 days   Minutes of Exercise per Session: 0 min  Stress: No Stress Concern Present   Feeling of Stress : Not at all  Social Connections: Not on file    Tobacco Counseling Counseling given: Not Answered   Clinical Intake:  Pre-visit preparation completed: Yes  Pain : No/denies pain     Nutritional Status: BMI > 30  Obese Nutritional Risks: None Diabetes: No  How often do you need to have someone help you when you read instructions, pamphlets, or other written materials from your doctor or pharmacy?: 1 - Never What is the last grade level you completed in school?: 2 masters degree  Diabetic? no  Interpreter Needed?: No  Information entered by :: NAllen LPN   Activities of Daily Living In your present state of health, do you have any difficulty performing the following activities: 04/23/2021 04/09/2021  Hearing? N N  Vision? N N  Difficulty concentrating or making decisions? N N  Walking or climbing stairs? N N  Dressing or bathing? N N  Doing errands, shopping? N N  Preparing Food and eating ? N -  Using the Toilet? N -  In the past six months, have you accidently leaked urine? N -  Do you have problems with loss of bowel control? N -  Managing your Medications? N -  Managing your Finances? N -  Housekeeping or managing your Housekeeping? N -  Some recent data might be hidden    Patient Care Team: Glendale Chard, MD as PCP - General (Internal Medicine)  Indicate any recent Medical Services you may have received from other than [facility] providers in the past year (date may be approximate).     Assessment:   This is a routine wellness  examination for [facility].  Hearing/Vision screen Vision Screening - Comments:: No regular eye exams, [facility]  Dietary issues and exercise activities discussed: Current Exercise Habits: The patient does not participate in regular exercise at present   Goals Addressed             This Visit's Progress    Patient Stated       04/23/2021, no goals       Depression Screen PHQ 2/9 Scores 04/23/2021 04/09/2021 04/03/2020 04/02/2020 03/29/2019 01/18/2019  PHQ - 2 Score 0 0 0 0 0 0  PHQ- 9 Score - 0 0 - 0 -  Fall Risk Fall Risk  04/23/2021 04/09/2021 04/03/2020 10/09/2019 03/29/2019  Falls in the past year? 0 0 0 0 0  Number falls in past yr: - 0 - - -  Injury with Fall? - 0 - - -  Risk for fall due to : Medication side effect - Medication side effect - Medication side effect  Follow up Falls evaluation completed;Education provided;Falls prevention discussed - Falls evaluation completed;Education provided;Falls prevention discussed - Falls evaluation completed;Education provided;Falls prevention discussed    FALL RISK PREVENTION PERTAINING TO THE HOME:  Any stairs in or around the home? Yes  If so, are there any without handrails? No  Home free of loose throw rugs in walkways, pet beds, electrical cords, etc? Yes  Adequate lighting in your home to reduce risk of falls? Yes   ASSISTIVE DEVICES UTILIZED TO PREVENT FALLS:  Life alert? No  Use of a cane, walker or w/c? No  Grab bars in the bathroom? Yes  Shower chair or bench in shower? No  Elevated toilet seat or a handicapped toilet? No   TIMED UP AND GO:  Was the test performed? No .    Gait slow and steady without use of assistive device  Cognitive Function:     6CIT Screen 04/23/2021 04/03/2020 03/29/2019  What Year? 0 points 0 points 0 points  What month? 0 points 0 points 0 points  What time? 0 points 0 points 0 points  Count back from 20 0 points 0 points 0 points  Months in reverse 0 points 2 points 2 points  Repeat  phrase 2 points 0 points 0 points  Total Score '2 2 2    '$ Immunizations Immunization History  Administered Date(s) Administered   Moderna Sars-Covid-2 Vaccination 08/24/2019, 09/21/2019    TDAP status: Up to date  Flu Vaccine status: Declined, Education has been provided regarding the importance of this vaccine but patient still declined. Advised may receive this vaccine at local pharmacy or Health Dept. Aware to provide a copy of the vaccination record if obtained from local pharmacy or Health Dept. Verbalized acceptance and understanding.  Pneumococcal vaccine status: Up to date  Covid-19 vaccine status: Completed vaccines  Qualifies for Shingles Vaccine? Yes   Zostavax completed No   Shingrix Completed?: No.    Education has been provided regarding the importance of this vaccine. Patient has been advised to call insurance company to determine out of pocket expense if they have not yet received this vaccine. Advised may also receive vaccine at local pharmacy or Health Dept. Verbalized acceptance and understanding.  Screening Tests Health Maintenance  Topic Date Due   COVID-19 Vaccine (3 - Booster for Moderna series) 05/09/2021 (Originally 02/18/2020)   Zoster Vaccines- Shingrix (1 of 2) 07/10/2021 (Originally 09/24/1998)   INFLUENZA VACCINE  11/13/2021 (Originally 03/16/2021)   TETANUS/TDAP  08/05/2021   COLONOSCOPY (Pts 45-51yr Insurance coverage will need to be confirmed)  01/01/2026   Hepatitis C Screening  Completed   PNA vac Low Risk Adult  Completed   HPV VACCINES  Aged Out    Health Maintenance  There are no preventive care reminders to display for this patient.   Colorectal cancer screening: scheduled 05/04/2021  Lung Cancer Screening: (Low Dose CT Chest recommended if Age 72-80years, 30 pack-year currently smoking OR have quit w/in 15years.) does not qualify.   Lung Cancer Screening Referral: no  Additional Screening:  Hepatitis C Screening: does qualify; Completed  11/16/2012  Vision Screening: Recommended annual ophthalmology exams for early detection of  glaucoma and other disorders of the eye. Is the patient up to date with their annual eye exam?  Yes  Who is the provider or what is the name of the office in which the patient attends annual eye exams? [facility] If pt is not established with a provider, would they like to be referred to a provider to establish care? No .   Dental Screening: Recommended annual dental exams for proper oral hygiene  Community Resource Referral / Chronic Care Management: CRR required this visit?  No   CCM required this visit?  No      Plan:     I have personally reviewed and noted the following in the patient's chart:   Medical and social history Use of alcohol, tobacco or illicit drugs  Current medications and supplements including opioid prescriptions. Patient is not currently taking opioid prescriptions. Functional ability and status Nutritional status Physical activity Advanced directives List of other physicians Hospitalizations, surgeries, and ER visits in previous 12 months Vitals Screenings to include cognitive, depression, and falls Referrals and appointments  In addition, I have reviewed and discussed with patient certain preventive protocols, quality metrics, and best practice recommendations. A written personalized care plan for preventive services as well as general preventive health recommendations were provided to patient.     Kellie Simmering, LPN   [id number]   Nurse Notes:

## 2021-05-04 DIAGNOSIS — D120 Benign neoplasm of cecum: Secondary | ICD-10-CM | POA: Diagnosis not present

## 2021-05-04 DIAGNOSIS — K648 Other hemorrhoids: Secondary | ICD-10-CM | POA: Diagnosis not present

## 2021-05-04 DIAGNOSIS — K5730 Diverticulosis of large intestine without perforation or abscess without bleeding: Secondary | ICD-10-CM | POA: Diagnosis not present

## 2021-05-04 DIAGNOSIS — D123 Benign neoplasm of transverse colon: Secondary | ICD-10-CM | POA: Diagnosis not present

## 2021-05-04 DIAGNOSIS — Z86010 Personal history of colonic polyps: Secondary | ICD-10-CM | POA: Diagnosis not present

## 2021-05-04 DIAGNOSIS — D122 Benign neoplasm of ascending colon: Secondary | ICD-10-CM | POA: Diagnosis not present

## 2021-05-04 LAB — HM COLONOSCOPY

## 2021-05-06 DIAGNOSIS — D120 Benign neoplasm of cecum: Secondary | ICD-10-CM | POA: Diagnosis not present

## 2021-05-06 DIAGNOSIS — D122 Benign neoplasm of ascending colon: Secondary | ICD-10-CM | POA: Diagnosis not present

## 2021-05-06 DIAGNOSIS — D123 Benign neoplasm of transverse colon: Secondary | ICD-10-CM | POA: Diagnosis not present

## 2021-05-13 DIAGNOSIS — I872 Venous insufficiency (chronic) (peripheral): Secondary | ICD-10-CM | POA: Diagnosis not present

## 2021-05-14 ENCOUNTER — Encounter: Payer: Self-pay | Admitting: Internal Medicine

## 2021-06-07 ENCOUNTER — Other Ambulatory Visit: Payer: Self-pay | Admitting: Internal Medicine

## 2021-06-17 ENCOUNTER — Other Ambulatory Visit: Payer: Self-pay | Admitting: Urology

## 2021-09-16 DIAGNOSIS — I129 Hypertensive chronic kidney disease with stage 1 through stage 4 chronic kidney disease, or unspecified chronic kidney disease: Secondary | ICD-10-CM | POA: Diagnosis not present

## 2021-09-16 DIAGNOSIS — R809 Proteinuria, unspecified: Secondary | ICD-10-CM | POA: Diagnosis not present

## 2021-09-16 DIAGNOSIS — N2581 Secondary hyperparathyroidism of renal origin: Secondary | ICD-10-CM | POA: Diagnosis not present

## 2021-09-16 DIAGNOSIS — N1832 Chronic kidney disease, stage 3b: Secondary | ICD-10-CM | POA: Diagnosis not present

## 2021-09-18 ENCOUNTER — Other Ambulatory Visit: Payer: Self-pay | Admitting: Internal Medicine

## 2021-10-13 ENCOUNTER — Other Ambulatory Visit: Payer: Self-pay

## 2021-10-13 ENCOUNTER — Ambulatory Visit: Payer: Medicare PPO | Admitting: Internal Medicine

## 2021-10-13 ENCOUNTER — Ambulatory Visit (INDEPENDENT_AMBULATORY_CARE_PROVIDER_SITE_OTHER): Payer: Medicare PPO

## 2021-10-13 ENCOUNTER — Encounter: Payer: Self-pay | Admitting: Internal Medicine

## 2021-10-13 ENCOUNTER — Other Ambulatory Visit: Payer: Self-pay | Admitting: Internal Medicine

## 2021-10-13 VITALS — BP 110/80 | HR 86 | Temp 98.2°F | Ht 66.8 in | Wt 274.2 lb

## 2021-10-13 DIAGNOSIS — E7800 Pure hypercholesterolemia, unspecified: Secondary | ICD-10-CM | POA: Diagnosis not present

## 2021-10-13 DIAGNOSIS — N2581 Secondary hyperparathyroidism of renal origin: Secondary | ICD-10-CM | POA: Diagnosis not present

## 2021-10-13 DIAGNOSIS — R0609 Other forms of dyspnea: Secondary | ICD-10-CM

## 2021-10-13 DIAGNOSIS — Z6841 Body Mass Index (BMI) 40.0 and over, adult: Secondary | ICD-10-CM | POA: Diagnosis not present

## 2021-10-13 DIAGNOSIS — N1832 Chronic kidney disease, stage 3b: Secondary | ICD-10-CM | POA: Diagnosis not present

## 2021-10-13 DIAGNOSIS — Z23 Encounter for immunization: Secondary | ICD-10-CM

## 2021-10-13 DIAGNOSIS — I129 Hypertensive chronic kidney disease with stage 1 through stage 4 chronic kidney disease, or unspecified chronic kidney disease: Secondary | ICD-10-CM | POA: Diagnosis not present

## 2021-10-13 MED ORDER — FLUTICASONE PROPIONATE 50 MCG/ACT NA SUSP: 1.0000 | Freq: Every day | NASAL | 2 refills | Status: DC

## 2021-10-13 NOTE — Patient Instructions (Signed)

## 2021-10-13 NOTE — Progress Notes (Signed)
° °  Covid-19 Vaccination Clinic  Name:  Andrew Schmitt    MRN: [id number] DOB: 06-03-1951  10/13/2021  [name] was observed post Covid-19 immunization for 15 minutes without incident. He was provided with Vaccine Information Sheet and instruction to access the V-Safe system.   [name] was instructed to call 911 with any severe reactions post vaccine: Difficulty breathing  Swelling of face and throat  A fast heartbeat  A bad rash all over body  Dizziness and weakness   Immunizations Administered     Name Date Dose VIS Date Route   Moderna Covid-19 vaccine Bivalent Booster 10/13/2021 10:23 AM 0.5 mL 03/28/2021 Intramuscular   Manufacturer: Moderna   Lot: [id number]   Marie: [id number]

## 2021-10-13 NOTE — Progress Notes (Signed)
Rich Brave [place],acting as a Education administrator for Maximino Greenland, MD.,have documented all relevant documentation on the behalf of Maximino Greenland, MD,as directed by  Maximino Greenland, MD while in the presence of Maximino Greenland, MD.  This visit occurred during the SARS-CoV-2 public health emergency.  Safety protocols were in place, including screening questions prior to the visit, additional usage of staff PPE, and extensive cleaning of exam room while observing appropriate contact time as indicated for disinfecting solutions.  Subjective:     Patient ID: Andrew Schmitt , male    DOB: 06/13/1951 , 71 y.o.   MRN: [id number]   Chief Complaint  Patient presents with   Hypertension    HPI  He is here today for bp check. He reports compliance with meds. He denies headaches, chest pain and palpitations. He has no specific concerns or complaints at this time.     Hypertension This is a chronic problem. The current episode started more than 1 year ago. The problem has been gradually improving since onset. Pertinent negatives include no blurred vision, chest pain, palpitations or shortness of breath. Risk factors for coronary artery disease include obesity, sedentary lifestyle, male gender and dyslipidemia. Past treatments include direct vasodilators.    Past Medical History:  Diagnosis Date   BPH (benign prostatic hyperplasia)    CKD (chronic kidney disease) stage 3, GFR 30-59 ml/min ([place]) 04/05/2019   Erectile dysfunction    Gout    Gout    Heart murmur    Hyperlipidemia    Hypertension    Obesity      Family History  Problem Relation Age of Onset   Healthy Mother    Healthy Father      Current Outpatient Medications:    allopurinol (ZYLOPRIM) 300 MG tablet, TAKE 1 TABLET BY MOUTH EVERY DAY, Disp: 30 tablet, Rfl: 5   amLODipine (NORVASC) 10 MG tablet, TAKE 1 TABLET BY MOUTH EVERY DAY, Disp: 90 tablet, Rfl: 2   aspirin EC 81 MG tablet, Take 81 mg by mouth daily., Disp: , Rfl:     carvedilol (COREG) 6.25 MG tablet, TAKE 1 TABLET BY MOUTH TWICE A DAY WITH FOOD, Disp: 180 tablet, Rfl: 0   chlorthalidone (HYGROTON) 50 MG tablet, Take 50 mg by mouth daily., Disp: , Rfl:    cholecalciferol (VITAMIN D) 1000 units tablet, Take 1,000 Units by mouth daily., Disp: , Rfl:    docusate sodium (COLACE) 100 MG capsule, Take 100 mg by mouth 2 (two) times daily., Disp: , Rfl:    Emollient (AVEENO POSITIVELY AGELESS EX), Apply 1 tablet topically daily., Disp: , Rfl:    finasteride (PROSCAR) 5 MG tablet, TAKE 1 TABLET BY MOUTH EVERY DAY, Disp: 90 tablet, Rfl: 1   fluticasone (FLONASE) 50 MCG/ACT nasal spray, Place 1 spray into both nostrils daily., Disp: 16 g, Rfl: 2   furosemide (LASIX) 20 MG tablet, TAKE 1 TABLET BY MOUTH EVERY DAY, Disp: 90 tablet, Rfl: 1   Garlic 10 MG CAPS, Take by mouth., Disp: , Rfl:    hydrALAZINE (APRESOLINE) 100 MG tablet, TAKE 1 TABLET (100 MG TOTAL) BY MOUTH 3 (THREE) TIMES DAILY., Disp: 90 tablet, Rfl: 0   ibuprofen (ADVIL) 200 MG tablet, Take 200 mg by mouth every 6 (six) hours as needed., Disp: , Rfl:    rosuvastatin (CRESTOR) 10 MG tablet, TAKE 1 TABLET BY MOUTH EVERY DAY, Disp: 90 tablet, Rfl: 1   tamsulosin (FLOMAX) 0.4 MG CAPS capsule, TAKE 1 CAPSLE BY MOUTH  DAILY, Disp: 90 capsule, Rfl: 3   vitamin B-12 (CYANOCOBALAMIN) 500 MCG tablet, Take 500 mcg by mouth daily., Disp: , Rfl:    sildenafil (REVATIO) 20 MG tablet, TAKE 1 TABLET BY MOUTH ONCE DAILY AS NEEDED, Disp: 30 tablet, Rfl: 0   Allergies  Allergen Reactions   Ace Inhibitors Cough     Review of Systems  Constitutional: Negative.   HENT:  Positive for congestion.        He c/o sinus congestion. No fever/chills. Denies ill contacts. He has tried Sinus Severe OTC NS w/ some improvement in his sx.   Eyes: Negative.  Negative for blurred vision.  Respiratory: Negative.  Negative for shortness of breath.   Cardiovascular: Negative.  Negative for chest pain and palpitations.  Gastrointestinal:  Negative.   Musculoskeletal: Negative.   Skin: Negative.   Neurological: Negative.   Psychiatric/Behavioral: Negative.      Today's Vitals   10/13/21 0948  BP: 110/80  Pulse: 86  Temp: 98.2 F (36.8 C)  Weight: 274 lb 3.2 oz (124.4 kg)  Height: 5' 6.8" (1.697 m)  PainSc: 0-No pain   Body mass index is 43.2 kg/m.  Wt Readings from Last 3 Encounters:  10/13/21 274 lb 3.2 oz (124.4 kg)  04/23/21 281 lb 3.2 oz (127.6 kg)  04/09/21 279 lb 3.2 oz (126.6 kg)    Objective:  Physical Exam Vitals and nursing note reviewed.  Constitutional:      Appearance: Normal appearance. He is obese.  HENT:     Head: Normocephalic and atraumatic.     Nose:     Comments: Masked     Mouth/Throat:     Comments: Masked  Eyes:     Extraocular Movements: Extraocular movements intact.  Cardiovascular:     Rate and Rhythm: Normal rate and regular rhythm.     Heart sounds: Normal heart sounds.  Pulmonary:     Effort: Pulmonary effort is normal.     Breath sounds: Normal breath sounds.  Musculoskeletal:     Cervical back: Normal range of motion.  Skin:    General: Skin is warm.  Neurological:     General: No focal deficit present.     Mental Status: He is alert.  Psychiatric:        Mood and Affect: Mood normal.        Assessment And Plan:     1. Benign hypertensive renal disease Comments: Chronic, well controlled. He is encouraged to follow low sodium diet. I will check renal function today.  - CMP14+EGFR - Insulin, random([phone])  2. Stage 3b chronic kidney disease ([place]) Comments: Chronic. He is encouraged to keep BP well controlled, stay hydrated and avoid NSAIDs to decrease risk of CKD progression.  - Protein electrophoresis, serum  3. 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   4. Dyspnea on exertion Comments: I will check labs as below. Deconditioning is likely contributory. I will  schedule him for 2d Echo to evaluate cardiac function. I will also refer him to Cardio - Ambulatory referral to Cardiology - Brain natriuretic peptide - ECHOCARDIOGRAM COMPLETE; Future - CBC no Diff  5. Pure hypercholesterolemia Comments: Chronic, I will check lipid panel today. He will c/w rosuvastatin for now.   6. Class 3 severe obesity due to excess calories with serious comorbidity and body mass index (BMI) of 40.0 to 44.9 in adult [facility]) Comments: BMI 43. He was congratulated on 7  lb weight loss since Sept 2022. He is encouraged to aim to lose 20 lbs to further decrease cardiac risk.    Patient was given opportunity to ask questions. Patient verbalized understanding of the plan and was able to repeat key elements of the plan. All questions were answered to their satisfaction.   I, Maximino Greenland, MD, have reviewed all documentation for this visit. The documentation on 10/15/21 for the exam, diagnosis, procedures, and orders are all accurate and complete.   IF YOU HAVE BEEN REFERRED TO A SPECIALIST, IT MAY TAKE 1-2 WEEKS TO SCHEDULE/PROCESS THE REFERRAL. IF YOU HAVE NOT HEARD FROM US/SPECIALIST IN TWO WEEKS, PLEASE GIVE [place] A CALL AT [phone].   THE PATIENT IS ENCOURAGED TO PRACTICE SOCIAL DISTANCING DUE TO THE COVID-19 PANDEMIC.

## 2021-10-15 DIAGNOSIS — I129 Hypertensive chronic kidney disease with stage 1 through stage 4 chronic kidney disease, or unspecified chronic kidney disease: Secondary | ICD-10-CM | POA: Insufficient documentation

## 2021-10-15 DIAGNOSIS — I1310 Hypertensive heart and chronic kidney disease without heart failure, with stage 1 through stage 4 chronic kidney disease, or unspecified chronic kidney disease: Secondary | ICD-10-CM | POA: Insufficient documentation

## 2021-10-15 DIAGNOSIS — R0602 Shortness of breath: Secondary | ICD-10-CM | POA: Insufficient documentation

## 2021-10-15 DIAGNOSIS — R0609 Other forms of dyspnea: Secondary | ICD-10-CM | POA: Insufficient documentation

## 2021-10-15 DIAGNOSIS — N2581 Secondary hyperparathyroidism of renal origin: Secondary | ICD-10-CM | POA: Insufficient documentation

## 2021-10-15 LAB — PROTEIN ELECTROPHORESIS, SERUM
A/G Ratio: 0.8 (ref 0.7–1.7)
Albumin ELP: 3.5 g/dL (ref 2.9–4.4)
Alpha 1: 0.2 g/dL (ref 0.0–0.4)
Alpha 2: 0.8 g/dL (ref 0.4–1.0)
Beta: 1.1 g/dL (ref 0.7–1.3)
Gamma Globulin: 2.3 g/dL — ABNORMAL HIGH (ref 0.4–1.8)
Globulin, Total: 4.4 g/dL — ABNORMAL HIGH (ref 2.2–3.9)

## 2021-10-15 LAB — CMP14+EGFR
ALT: 16 IU/L (ref 0–44)
AST: 18 IU/L (ref 0–40)
Albumin/Globulin Ratio: 1.1 — ABNORMAL LOW (ref 1.2–2.2)
Albumin: 4.1 g/dL (ref 3.7–4.7)
Alkaline Phosphatase: 78 IU/L (ref 44–121)
BUN/Creatinine Ratio: 21 (ref 10–24)
BUN: 40 mg/dL — ABNORMAL HIGH (ref 8–27)
Bilirubin Total: 0.4 mg/dL (ref 0.0–1.2)
CO2: 23 mmol/L (ref 20–29)
Calcium: 9.4 mg/dL (ref 8.6–10.2)
Chloride: 102 mmol/L (ref 96–106)
Creatinine, Ser: 1.95 mg/dL — ABNORMAL HIGH (ref 0.76–1.27)
Globulin, Total: 3.8 g/dL (ref 1.5–4.5)
Glucose: 106 mg/dL — ABNORMAL HIGH (ref 70–99)
Potassium: 4.3 mmol/L (ref 3.5–5.2)
Sodium: 136 mmol/L (ref 134–144)
Total Protein: 7.9 g/dL (ref 6.0–8.5)
eGFR: 36 mL/min/{1.73_m2} — ABNORMAL LOW (ref >59)

## 2021-10-15 LAB — INSULIN, RANDOM: INSULIN: 11.3 u[IU]/mL (ref 2.6–24.9)

## 2021-10-15 LAB — BRAIN NATRIURETIC PEPTIDE: BNP: 86.4 pg/mL (ref 0.0–100.0)

## 2021-10-19 DIAGNOSIS — N1832 Chronic kidney disease, stage 3b: Secondary | ICD-10-CM | POA: Diagnosis not present

## 2021-10-21 ENCOUNTER — Ambulatory Visit (HOSPITAL_COMMUNITY)
Admission: RE | Admit: 2021-10-21 | Discharge: 2021-10-21 | Disposition: A | Discharge status: Home / Self Care | Payer: Medicare PPO | Source: Ambulatory Visit | Attending: Internal Medicine | Admitting: Internal Medicine

## 2021-10-21 ENCOUNTER — Other Ambulatory Visit: Payer: Self-pay

## 2021-10-21 DIAGNOSIS — I119 Hypertensive heart disease without heart failure: Secondary | ICD-10-CM | POA: Diagnosis not present

## 2021-10-21 DIAGNOSIS — E785 Hyperlipidemia, unspecified: Secondary | ICD-10-CM | POA: Insufficient documentation

## 2021-10-21 DIAGNOSIS — R0609 Other forms of dyspnea: Secondary | ICD-10-CM | POA: Diagnosis not present

## 2021-10-21 LAB — ECHOCARDIOGRAM COMPLETE
Calc EF: 59 %
Single Plane A2C EF: 52.6 %
Single Plane A4C EF: 65.6 %

## 2021-11-10 ENCOUNTER — Ambulatory Visit (INDEPENDENT_AMBULATORY_CARE_PROVIDER_SITE_OTHER): Payer: Medicare PPO

## 2021-11-10 ENCOUNTER — Other Ambulatory Visit: Payer: Self-pay

## 2021-11-10 VITALS — BP 130/70 | HR 66 | Temp 98.2°F | Ht 66.8 in | Wt 270.0 lb

## 2021-11-10 DIAGNOSIS — Z23 Encounter for immunization: Secondary | ICD-10-CM | POA: Diagnosis not present

## 2021-11-10 NOTE — Progress Notes (Signed)
Patient presents for pneumonia vaccine

## 2021-11-30 ENCOUNTER — Ambulatory Visit (INDEPENDENT_AMBULATORY_CARE_PROVIDER_SITE_OTHER): Payer: Medicare PPO | Admitting: Cardiovascular Disease

## 2021-11-30 ENCOUNTER — Encounter (HOSPITAL_BASED_OUTPATIENT_CLINIC_OR_DEPARTMENT_OTHER): Payer: Self-pay | Admitting: Cardiovascular Disease

## 2021-11-30 DIAGNOSIS — R0609 Other forms of dyspnea: Secondary | ICD-10-CM | POA: Diagnosis not present

## 2021-11-30 DIAGNOSIS — I119 Hypertensive heart disease without heart failure: Secondary | ICD-10-CM

## 2021-11-30 NOTE — Progress Notes (Signed)
?Cardiology Office Note:   ? ?Date:  11/30/2021  ? ?ID:  Andrew Schmitt, DOB 12/12/1950, MRN [id number] ? ?PCP:  Glendale Chard, MD ?  ?[facility] Providers ?Cardiologist:  None    ? ?Referring MD: Glendale Chard, MD  ? ?No chief complaint on file. ? ? ?History of Present Illness:   ? ?Andrew Schmitt is a 71 y.o. male with a hx of hypertension, hyperlipidemia, CKD III, gout, and obesity, here for the evaluation of dyspnea on exertion. He saw his PCP Dr. Baird Cancer on 10/13/2021. He complained of DOE, with deconditioning thought to be contributory. An Echo was ordered to evaluate cardiac function, and he was referred to cardiology. The Echo was performed 10/21/21 and showed LVEF 60-65% and grade 2 diastolic dysfunction. ? ?Today, his main concern is his breathing. At times he is feeling short of breath, such as when climbing stairs. He states this is an ongoing issue, which seems to be gradually worsening. He denies associated chest pain or pressure. Of note, he used to have more breathing issues while sleeping on only 1 pillow. After sleeping for 1.5 hours his breathing would become difficult. Lately he is sleeping well by using a bed with incline adjustments. He used to snore but he does not believe he snores currently. He will take naps sometimes. Years ago he had a sleep study at [facility] in [place]. Also he may walk around the neighborhood for exercise or to wake himself up. Usually he walks 10-15 minutes about 1-2 times a week. He will plan to work on increasing his exercise. Sometimes he checks his BP at home, which is usually "pretty good." He has noticed blood pressures as high as 150/80,  which he attributes to eating certain foods including bacon. He cooks his meals at home and tries to limit his salt intake. Normally he drinks a lot of water, with occasional tea. Additionally, he endorses LE edema, and wears compression socks every day. He is taking furosemide daily. At times he is also taking  ibuprofen. He denies any palpitations, lightheadedness, headaches, syncope, orthopnea, or PND. ? ? ?Past Medical History:  ?Diagnosis Date  ? BPH (benign prostatic hyperplasia)   ? CKD (chronic kidney disease) stage 3, GFR 30-59 ml/min ([place]) 04/05/2019  ? Erectile dysfunction   ? Gout   ? Gout   ? Heart murmur   ? Hyperlipidemia   ? Hypertension   ? Obesity   ? ? ?Past Surgical History:  ?Procedure Laterality Date  ? CARDIAC SURGERY    ? ? ?Current Medications: ?Current Meds  ?Medication Sig  ? allopurinol (ZYLOPRIM) 300 MG tablet TAKE 1 TABLET BY MOUTH EVERY DAY  ? amLODipine (NORVASC) 10 MG tablet TAKE 1 TABLET BY MOUTH EVERY DAY  ? aspirin EC 81 MG tablet Take 81 mg by mouth daily.  ? carvedilol (COREG) 6.25 MG tablet TAKE 1 TABLET BY MOUTH TWICE A DAY WITH FOOD  ? chlorthalidone (HYGROTON) 50 MG tablet Take 50 mg by mouth daily.  ? cholecalciferol (VITAMIN D) 1000 units tablet Take 1,000 Units by mouth daily.  ? docusate sodium (COLACE) 100 MG capsule Take 100 mg by mouth 2 (two) times daily.  ? Emollient (AVEENO POSITIVELY AGELESS EX) Apply 1 tablet topically daily.  ? FARXIGA 10 MG TABS tablet Take 1 tablet by mouth daily.  ? finasteride (PROSCAR) 5 MG tablet TAKE 1 TABLET BY MOUTH EVERY DAY  ? fluticasone (FLONASE) 50 MCG/ACT nasal spray Place 1 spray into both nostrils  daily.  ? furosemide (LASIX) 20 MG tablet TAKE 1 TABLET BY MOUTH EVERY DAY  ? Garlic 10 MG CAPS Take by mouth.  ? hydrALAZINE (APRESOLINE) 100 MG tablet TAKE 1 TABLET (100 MG TOTAL) BY MOUTH 3 (THREE) TIMES DAILY.  ? rosuvastatin (CRESTOR) 10 MG tablet TAKE 1 TABLET BY MOUTH EVERY DAY  ? sildenafil (REVATIO) 20 MG tablet TAKE 1 TABLET BY MOUTH ONCE DAILY AS NEEDED  ? tamsulosin (FLOMAX) 0.4 MG CAPS capsule TAKE 1 CAPSLE BY MOUTH DAILY  ? vitamin B-12 (CYANOCOBALAMIN) 500 MCG tablet Take 500 mcg by mouth daily.  ? [DISCONTINUED] ibuprofen (ADVIL) 200 MG tablet Take 200 mg by mouth every 6 (six) hours as needed.  ?  ? ?Allergies:   Ace inhibitors   ? ?Social History  ? ?Socioeconomic History  ? Marital status: Married  ?  Spouse name: Not on file  ? Number of children: 4  ? Years of education: Not on file  ? Highest education level: Not on file  ?Occupational History  ? Occupation: retired  ?Tobacco Use  ? Smoking status: Never  ? Smokeless tobacco: Never  ?Vaping Use  ? Vaping Use: Never used  ?Substance and Sexual Activity  ? Alcohol use: No  ? Drug use: No  ? Sexual activity: Yes  ?Other Topics Concern  ? Not on file  ?Social History Narrative  ? Not on file  ? ?Social Determinants of Health  ? ?Financial Resource Strain: Low Risk   ? Difficulty of Paying Living Expenses: Not hard at all  ?Food Insecurity: No Food Insecurity  ? Worried About Charity fundraiser in the Last Year: Never true  ? Ran Out of Food in the Last Year: Never true  ?Transportation Needs: No Transportation Needs  ? Lack of Transportation (Medical): No  ? Lack of Transportation (Non-Medical): No  ?Physical Activity: Inactive  ? Days of Exercise per Week: 0 days  ? Minutes of Exercise per Session: 0 min  ?Stress: No Stress Concern Present  ? Feeling of Stress : Not at all  ?Social Connections: Not on file  ?  ? ?Family History: ?The patient's family history includes Healthy in his father and mother. ? ?ROS:   ?Please see the history of present illness.    ?(+) Shortness of breath ?(+) Daytime somnolence ?(+) Bilateral LE edema ?All other systems reviewed and are negative. ? ?EKGs/Labs/Other Studies Reviewed:   ? ?The following studies were reviewed today: ? ?Echo 10/21/2021: ?Sonographer Comments: 3D Heartmodel was attempted.  ?IMPRESSIONS  ? ? 1. Left ventricular ejection fraction, by estimation, is 60 to 65%. The  ?left ventricle has normal function. The left ventricle has no regional  ?wall motion abnormalities. There is moderate asymmetric left ventricular  ?hypertrophy of the basal-septal  ?segment. Left ventricular diastolic parameters are consistent with Grade  ?II diastolic  dysfunction (pseudonormalization).  ? 2. Right ventricular systolic function is normal. The right ventricular  ?size is normal. Tricuspid regurgitation signal is inadequate for assessing  ?PA pressure.  ? 3. Right atrial size was mildly dilated.  ? 4. The mitral valve is grossly normal. No evidence of mitral valve  ?regurgitation.  ? 5. The aortic valve was not well visualized. Aortic valve regurgitation  ?is not visualized. No aortic stenosis is present.  ? 6. The inferior vena cava is normal in size with greater than 50%  ?respiratory variability, suggesting right atrial pressure of 3 mmHg.  ? ?Comparison(s): No prior Echocardiogram.  ? ?EKG:  EKG is personally reviewed. ?11/30/2021: Sinus bradycardia. Rate 55 bpm. First degree heart block. Poor R wave progression. ? ?Recent Labs: ?04/09/2021: Hemoglobin 11.6; Platelets 120 ?10/13/2021: ALT 16; BNP 86.4; BUN 40; Creatinine, Ser 1.95; Potassium 4.3; Sodium 136  ? ?Recent Lipid Panel ?   ?Component Value Date/Time  ? CHOL 152 04/09/2021 1104  ? TRIG 177 (H) 04/09/2021 1104  ? HDL 41 04/09/2021 1104  ? CHOLHDL 3.7 04/09/2021 1104  ? [place]acy 81 04/09/2021 1104  ? ?    ? ?Physical Exam:   ? ?Wt Readings from Last 3 Encounters:  ?11/30/21 270 lb 1.6 oz (122.5 kg)  ?11/10/21 270 lb (122.5 kg)  ?10/13/21 274 lb 3.2 oz (124.4 kg)  ?  ? ?VS:  BP (!) 142/82 (BP Location: Left Arm, Patient Position: Sitting, Cuff Size: Large)   Pulse (!) 55   Ht 5\' 6"  (1.676 m)   Wt 270 lb 1.6 oz (122.5 kg)   BMI 43.60 kg/m?  , BMI Body mass index is 43.6 kg/m?. ?GENERAL:  Well appearing ?HEENT: Pupils equal round and reactive, fundi not visualized, oral mucosa unremarkable ?NECK:  No jugular venous distention, waveform within normal limits, carotid upstroke brisk and symmetric, no bruits, no thyromegaly ?LUNGS:  Clear to auscultation bilaterally ?HEART:  RRR.  PMI not displaced or sustained,S1 and S2 within normal limits, no S3, no S4, no clicks, no rubs, no murmurs ?ABD:  Flat, positive  bowel sounds normal in frequency in pitch, no bruits, no rebound, no guarding, no midline pulsatile mass, no hepatomegaly, no splenomegaly ?EXT:  2 plus pulses throughout, no edema, no cyanosis no clubbing ?[place]

## 2021-11-30 NOTE — Assessment & Plan Note (Signed)
He has exertional dyspnea that has been gradual.  He has no chest pain.  I will think an ischemic evaluation is warranted.  Echo revealed normal systolic function with some hypertrophy.  This is likely due to hypertensive heart disease.  Working on blood pressure and exercise as above.  Recommended that he get a coronary calcium score to better define his lipid goal and overall cardiovascular risk.  Work on increasing exercise.  If his dyspnea is not improving with increased exercise, we will consider ischemic evaluation at follow-up. ?

## 2021-11-30 NOTE — Patient Instructions (Signed)
Medication Instructions:  ?AVOID TAKING IBUPROFEN AND DRUGS SIMILAR (ANSAIDS) USE TYLENOL  ? ?*If you need a refill on your cardiac medications before your next appointment, please call your pharmacy* ? ?Lab Work: ?LABS TODAY  ? ?If you have labs (blood work) drawn today and your tests are completely normal, you will receive your results only by: ?MyChart Message (if you have MyChart) OR ?A paper copy in the mail ?If you have any lab test that is abnormal or we need to change your treatment, we will call you to review the results. ? ?Testing/Procedures: ?Your physician has requested that you have a renal artery duplex. During this test, an ultrasound is used to evaluate blood flow to the kidneys. Allow one hour for this exam. Do not eat after midnight the day before and avoid carbonated beverages. Take your medications as you usually do. ? ?CALCIUM SCORE  - THIS WILL COST YOU $99 OUT OF POCKET ? ?Follow-Up: ?At [facility], you and your health needs are our priority.  As part of our continuing mission to provide you with exceptional heart care, we have created designated Provider Care Teams.  These Care Teams include your primary Cardiologist (physician) and Advanced Practice Providers (APPs -  Physician Assistants and Nurse Practitioners) who all work together to provide you with the care you need, when you need it. ? ?We recommend signing up for the patient portal called "MyChart".  Sign up information is provided on this After Visit Summary.  MyChart is used to connect with patients for Virtual Visits (Telemedicine).  Patients are able to view lab/test results, encounter notes, upcoming appointments, etc.  Non-urgent messages can be sent to your provider as well.   ?To learn more about what you can do with MyChart, go to [email or web address].   ? ?Your next appointment:   ?3 month(s) ? ?The format for your next appointment:   ?In Person ? ?Provider:   ?Skeet Latch, MD or Laurann Montana, NP  ? ?Other  Instructions ?Exercise recommendations: ?The American Heart Association recommends 150 minutes of moderate intensity exercise weekly. ?Try 30 minutes of moderate intensity exercise 4-5 times per week. ?This could include walking, jogging, or swimming. ? ?Important Information About Sugar ? ? ? ? ? ?

## 2021-11-30 NOTE — Assessment & Plan Note (Signed)
He was encouraged to work on diet and exercise as above. ?

## 2021-11-30 NOTE — Assessment & Plan Note (Signed)
Blood pressure control as above.  Continue Farxiga.  Discontinue all NSAIDs. ?

## 2021-11-30 NOTE — Assessment & Plan Note (Addendum)
Blood pressure poorly controlled both initially and on repeat.  He has resistant hypertension with blood pressure poorly controlled on more than 3 agents.  We discussed the fact that he does not have many options for blood pressure management given his bradycardia on low-dose carvedilol and chronic kidney disease.  He is on both chlorthalidone and furosemide, though renal function has been stable.  He reports that he is seen by nephrology.  I did advise him not to take any NSAIDs and ibuprofen was taken off of his medication list.  He is to use Tylenol only.  We will continue the amlodipine, carvedilol, chlorthalidone, furosemide, hydralazine, and tamsulosin.  Recommend that he increase his exercise to 150 minutes weekly and work on weight loss.  Limit sodium to less than 1500 mg daily.  We will give him a few months to work on diet and exercise before adding to his regimen.  We will also assess for secondary causes as below.  ? ?Screening for Secondary Hypertension:   ? ?  11/30/2021  ? 10:12 AM  ?Causes  ?Drugs/Herbals Screened  ?Renovascular HTN Screened  ?   - Comments Check renal Dopplers  ?Sleep Apnea Screened  ?   - Comments Patient reports prior sleep study and symptoms have resolved with elevation of his head.  Would consider repeat in the future.  ?Thyroid Disease Screened  ?   - Comments Check TSH  ?Hyperaldosteronism Screened  ?   - Comments Check renal and and aldosterone  ?Pheochromocytoma N/A  ?Cushing's Syndrome Screened  ?   - Comments Check cortisol  ?Hyperparathyroidism Screened  ?Coarctation of the Aorta Screened  ?   - Comments BP symmetric  ?Compliance Screened  ?  ?Relevant Labs/Studies: ? ?  Latest Ref Rng & Units 10/13/2021  ? 10:25 AM 04/09/2021  ? 11:04 AM 10/07/2020  ? 10:54 AM  ?Basic Labs  ?Sodium 134 - 144 mmol/L 136   141   141    ?Potassium 3.5 - 5.2 mmol/L 4.3   4.0   3.9    ?Creatinine 0.76 - 1.27 mg/dL 1.95   1.97   2.04    ?  ?   ?   ?   ?   ?   ? ?

## 2021-12-04 LAB — ALDOSTERONE + RENIN ACTIVITY W/ RATIO
ALDOS/RENIN RATIO: 15.2 (ref 0.0–30.0)
ALDOSTERONE: 4.8 ng/dL (ref 0.0–30.0)
Renin: 0.315 ng/mL/hr (ref 0.167–5.380)

## 2021-12-04 LAB — CORTISOL: Cortisol: 10 ug/dL (ref 6.2–19.4)

## 2021-12-04 LAB — TSH: TSH: 2.01 u[IU]/mL (ref 0.450–4.500)

## 2021-12-06 ENCOUNTER — Other Ambulatory Visit: Payer: Self-pay | Admitting: Internal Medicine

## 2021-12-10 ENCOUNTER — Ambulatory Visit (INDEPENDENT_AMBULATORY_CARE_PROVIDER_SITE_OTHER): Payer: Medicare PPO

## 2021-12-10 DIAGNOSIS — I119 Hypertensive heart disease without heart failure: Secondary | ICD-10-CM | POA: Diagnosis not present

## 2021-12-13 ENCOUNTER — Other Ambulatory Visit: Payer: Self-pay | Admitting: Internal Medicine

## 2021-12-14 ENCOUNTER — Other Ambulatory Visit: Payer: Self-pay | Admitting: Internal Medicine

## 2021-12-14 DIAGNOSIS — Z6841 Body Mass Index (BMI) 40.0 and over, adult: Secondary | ICD-10-CM | POA: Diagnosis not present

## 2021-12-14 DIAGNOSIS — M109 Gout, unspecified: Secondary | ICD-10-CM | POA: Diagnosis not present

## 2021-12-14 DIAGNOSIS — D6869 Other thrombophilia: Secondary | ICD-10-CM | POA: Diagnosis not present

## 2021-12-14 DIAGNOSIS — I129 Hypertensive chronic kidney disease with stage 1 through stage 4 chronic kidney disease, or unspecified chronic kidney disease: Secondary | ICD-10-CM | POA: Diagnosis not present

## 2021-12-14 DIAGNOSIS — E785 Hyperlipidemia, unspecified: Secondary | ICD-10-CM | POA: Diagnosis not present

## 2021-12-14 DIAGNOSIS — I4891 Unspecified atrial fibrillation: Secondary | ICD-10-CM | POA: Diagnosis not present

## 2021-12-14 DIAGNOSIS — E1151 Type 2 diabetes mellitus with diabetic peripheral angiopathy without gangrene: Secondary | ICD-10-CM | POA: Diagnosis not present

## 2021-12-14 DIAGNOSIS — K219 Gastro-esophageal reflux disease without esophagitis: Secondary | ICD-10-CM | POA: Diagnosis not present

## 2022-01-04 DIAGNOSIS — N1832 Chronic kidney disease, stage 3b: Secondary | ICD-10-CM | POA: Diagnosis not present

## 2022-01-12 ENCOUNTER — Ambulatory Visit
Admission: RE | Admit: 2022-01-12 | Discharge: 2022-01-12 | Disposition: A | Discharge status: Home / Self Care | Payer: Self-pay | Source: Ambulatory Visit | Attending: Cardiovascular Disease | Admitting: Cardiovascular Disease

## 2022-01-12 DIAGNOSIS — I119 Hypertensive heart disease without heart failure: Secondary | ICD-10-CM

## 2022-01-15 DIAGNOSIS — N2581 Secondary hyperparathyroidism of renal origin: Secondary | ICD-10-CM | POA: Diagnosis not present

## 2022-01-15 DIAGNOSIS — I129 Hypertensive chronic kidney disease with stage 1 through stage 4 chronic kidney disease, or unspecified chronic kidney disease: Secondary | ICD-10-CM | POA: Diagnosis not present

## 2022-01-15 DIAGNOSIS — R809 Proteinuria, unspecified: Secondary | ICD-10-CM | POA: Diagnosis not present

## 2022-01-15 DIAGNOSIS — N184 Chronic kidney disease, stage 4 (severe): Secondary | ICD-10-CM | POA: Diagnosis not present

## 2022-01-25 ENCOUNTER — Telehealth (HOSPITAL_BASED_OUTPATIENT_CLINIC_OR_DEPARTMENT_OTHER): Payer: Self-pay

## 2022-01-25 DIAGNOSIS — I77819 Aortic ectasia, unspecified site: Secondary | ICD-10-CM

## 2022-01-25 NOTE — Telephone Encounter (Addendum)
Results called to patient who verbalizes understanding!      ----- Message from Skeet Latch, MD sent at 01/25/2022  1:35 PM EDT ----- CT showed that there is some plaque in his coronary arteries.  The ascending aorta is also mildly dilated.  Continue taking rosuvastatin.  Echocardiogram in 1 year to make sure the aorta is stable.

## 2022-02-09 ENCOUNTER — Other Ambulatory Visit: Payer: Self-pay | Admitting: Internal Medicine

## 2022-02-27 ENCOUNTER — Other Ambulatory Visit: Payer: Self-pay | Admitting: Internal Medicine

## 2022-03-05 ENCOUNTER — Other Ambulatory Visit: Payer: Self-pay | Admitting: Internal Medicine

## 2022-03-15 ENCOUNTER — Other Ambulatory Visit: Payer: Self-pay | Admitting: Internal Medicine

## 2022-03-19 ENCOUNTER — Ambulatory Visit (HOSPITAL_BASED_OUTPATIENT_CLINIC_OR_DEPARTMENT_OTHER): Payer: Medicare PPO | Admitting: Cardiovascular Disease

## 2022-03-19 ENCOUNTER — Encounter (HOSPITAL_BASED_OUTPATIENT_CLINIC_OR_DEPARTMENT_OTHER): Payer: Self-pay | Admitting: Cardiovascular Disease

## 2022-03-19 VITALS — BP 124/70 | HR 73 | Ht 66.0 in | Wt 263.7 lb

## 2022-03-19 DIAGNOSIS — I7121 Aneurysm of the ascending aorta, without rupture: Secondary | ICD-10-CM | POA: Diagnosis not present

## 2022-03-19 DIAGNOSIS — E7800 Pure hypercholesterolemia, unspecified: Secondary | ICD-10-CM

## 2022-03-19 DIAGNOSIS — I2584 Coronary atherosclerosis due to calcified coronary lesion: Secondary | ICD-10-CM

## 2022-03-19 DIAGNOSIS — I119 Hypertensive heart disease without heart failure: Secondary | ICD-10-CM

## 2022-03-19 DIAGNOSIS — N184 Chronic kidney disease, stage 4 (severe): Secondary | ICD-10-CM

## 2022-03-19 DIAGNOSIS — Z5181 Encounter for therapeutic drug level monitoring: Secondary | ICD-10-CM | POA: Diagnosis not present

## 2022-03-19 DIAGNOSIS — I2510 Atherosclerotic heart disease of native coronary artery without angina pectoris: Secondary | ICD-10-CM | POA: Diagnosis not present

## 2022-03-19 HISTORY — DX: Atherosclerotic heart disease of native coronary artery without angina pectoris: I25.10

## 2022-03-19 HISTORY — DX: Aneurysm of the ascending aorta, without rupture: I71.21

## 2022-03-19 NOTE — Patient Instructions (Signed)
Medication Instructions:  Your physician recommends that you continue on your current medications as directed. Please refer to the Current Medication list given to you today.   *If you need a refill on your cardiac medications before your next appointment, please call your pharmacy*  Lab Work: FASTING LP/CMET SOON   If you have labs (blood work) drawn today and your tests are completely normal, you will receive your results only by: MyChart Message (if you have MyChart) OR A paper copy in the mail If you have any lab test that is abnormal or we need to change your treatment, we will call you to review the results.  Testing/Procedures: NONE  Follow-Up: At [facility], you and your health needs are our priority.  As part of our continuing mission to provide you with exceptional heart care, we have created designated Provider Care Teams.  These Care Teams include your primary Cardiologist (physician) and Advanced Practice Providers (APPs -  Physician Assistants and Nurse Practitioners) who all work together to provide you with the care you need, when you need it.  We recommend signing up for the patient portal called "MyChart".  Sign up information is provided on this After Visit Summary.  MyChart is used to connect with patients for Virtual Visits (Telemedicine).  Patients are able to view lab/test results, encounter notes, upcoming appointments, etc.  Non-urgent messages can be sent to your provider as well.   To learn more about what you can do with MyChart, go to [email or web address].    Your next appointment:   6 month(s)  The format for your next appointment:   In Person  Provider:   Tiffany Langley, MD{          

## 2022-03-19 NOTE — Assessment & Plan Note (Signed)
Coronary calcium score was 132, which is 63rd percentile.  Continue aspirin and rosuvastatin.  He will come back for fasting lipids and a CMP.

## 2022-03-19 NOTE — Assessment & Plan Note (Signed)
Renal function continues to decline.  He follows with [facility].  Blood pressure is well controlled as above.  Checking a CMP in a week.

## 2022-03-19 NOTE — Progress Notes (Signed)
Cardiology Office Note:    Date:  03/19/2022   ID:  Andrew Schmitt, DOB 1951/04/26, MRN [id number]  PCP:  Glendale Chard, MD   [place] Providers Cardiologist:  None     Referring MD: Glendale Chard, MD   No chief complaint on file.   History of Present Illness:    Andrew Schmitt is a 71 y.o. male with a hx of hypertension, hyperlipidemia, CKD III, gout, and obesity, here for the follow-up. He was initially seen on 11/2021 for dyspnea on exertion. He saw his PCP Dr. Baird Cancer on 10/13/2021. He complained of DOE, with deconditioning thought to be contributory. An Echo was ordered to evaluate cardiac function, and he was referred to cardiology. The Echo was performed 10/21/21 and showed LVEF 60-65% and grade 2 diastolic dysfunction.  His blood pressure was poorly controllled, but options were limited due to bradycardia and renal dysfunction. We recommended working on diet and exercise. Renal dopplers were normal 11/2021. Renin and aldosterone were normal. He had a calcium score of 132, which was 63rd percentile for age. His ascending aorta was mildly dilated at 4.0 cm. PA was 3.2.  Today, he states he has been pretty good. He has been walking for about an hour 3-4 days per week in the park for his exercise. He usually paces himself. He feels very good during these walks and his breathing is well without chest pain or pressure. His blood pressure in clinic was 124/70 with a recheck. He endorses he checks his blood pressure at home and has had good readings. He wears compression socks to help reduce his swelling. He reports that if he goes even one day without his compression socks, he can feel the increased swelling. He has recently lost weight and is continuing to work on losing weight. He has been attempting to watch his salt consumption in his diet. He denies any palpitations, chest pain, or shortness of breath. No lightheadedness, headaches, syncope, orthopnea, or PND.   Past Medical  History:  Diagnosis Date   Ascending aortic aneurysm ([place]) 03/19/2022   BPH (benign prostatic hyperplasia)    CKD (chronic kidney disease) stage 3, GFR 30-59 ml/min (HCC) 04/05/2019   CKD (chronic kidney disease), stage IV ([place]) 04/05/2019   Coronary artery calcification 03/19/2022   Erectile dysfunction    Gout    Gout    Heart murmur    Hyperlipidemia    Hypertension    Obesity     Past Surgical History:  Procedure Laterality Date   CARDIAC SURGERY      Current Medications: Current Meds  Medication Sig   allopurinol (ZYLOPRIM) 300 MG tablet TAKE 1 TABLET BY MOUTH EVERY DAY   amLODipine (NORVASC) 10 MG tablet TAKE 1 TABLET BY MOUTH EVERY DAY   aspirin EC 81 MG tablet Take 81 mg by mouth daily.   carvedilol (COREG) 6.25 MG tablet TAKE 1 TABLET BY MOUTH TWICE A DAY WITH FOOD   chlorthalidone (HYGROTON) 50 MG tablet Take 50 mg by mouth daily.   cholecalciferol (VITAMIN D) 1000 units tablet Take 1,000 Units by mouth daily.   docusate sodium (COLACE) 100 MG capsule Take 100 mg by mouth 2 (two) times daily.   Emollient (AVEENO POSITIVELY AGELESS EX) Apply 1 tablet topically daily.   finasteride (PROSCAR) 5 MG tablet TAKE 1 TABLET BY MOUTH EVERY DAY   fluticasone (FLONASE) 50 MCG/ACT nasal spray Place 1 spray into both nostrils daily.   furosemide (LASIX) 20 MG tablet TAKE 1 TABLET  BY MOUTH EVERY DAY   Garlic 10 MG CAPS Take by mouth.   hydrALAZINE (APRESOLINE) 100 MG tablet TAKE 1 TABLET (100 MG TOTAL) BY MOUTH 3 (THREE) TIMES DAILY.   JARDIANCE 25 MG TABS tablet Take 25 mg by mouth every morning.   rosuvastatin (CRESTOR) 10 MG tablet TAKE 1 TABLET BY MOUTH EVERY DAY   sildenafil (REVATIO) 20 MG tablet TAKE 1 TABLET BY MOUTH ONCE DAILY AS NEEDED   tamsulosin (FLOMAX) 0.4 MG CAPS capsule TAKE 1 CAPSLE BY MOUTH DAILY   vitamin B-12 (CYANOCOBALAMIN) 500 MCG tablet Take 500 mcg by mouth daily.   [DISCONTINUED] FARXIGA 10 MG TABS tablet Take 1 tablet by mouth daily.     Allergies:   Ace  inhibitors   Social History   Socioeconomic History   Marital status: Married    Spouse name: Not on file   Number of children: 4   Years of education: Not on file   Highest education level: Not on file  Occupational History   Occupation: retired  Tobacco Use   Smoking status: Never   Smokeless tobacco: Never  Vaping Use   Vaping Use: Never used  Substance and Sexual Activity   Alcohol use: No   Drug use: No   Sexual activity: Yes  Other Topics Concern   Not on file  Social History Narrative   Not on file   Social Determinants of Health   Financial Resource Strain: Low Risk  (04/23/2021)   Overall Financial Resource Strain (CARDIA)    Difficulty of Paying Living Expenses: Not hard at all  Food Insecurity: No Food Insecurity (04/23/2021)   Hunger Vital Sign    Worried About Running Out of Food in the Last Year: Never true    [place] in the Last Year: Never true  Transportation Needs: No Transportation Needs (04/23/2021)   PRAPARE - Hydrologist (Medical): No    Lack of Transportation (Non-Medical): No  Physical Activity: Inactive (04/23/2021)   Exercise Vital Sign    Days of Exercise per Week: 0 days    Minutes of Exercise per Session: 0 min  Stress: No Stress Concern Present (04/23/2021)   [place]    Feeling of Stress : Not at all  Social Connections: Not on file     Family History: The patient's family history includes Healthy in his father and mother.  ROS:   Please see the history of present illness.     All other systems reviewed and are negative.  EKGs/Labs/Other Studies Reviewed:    The following studies were reviewed today:  Echo 10/21/2021: Sonographer Comments: 3D Heartmodel was attempted.  IMPRESSIONS    1. Left ventricular ejection fraction, by estimation, is 60 to 65%. The  left ventricle has normal function. The left ventricle has no regional   wall motion abnormalities. There is moderate asymmetric left ventricular  hypertrophy of the basal-septal  segment. Left ventricular diastolic parameters are consistent with Grade  II diastolic dysfunction (pseudonormalization).   2. Right ventricular systolic function is normal. The right ventricular  size is normal. Tricuspid regurgitation signal is inadequate for assessing  PA pressure.   3. Right atrial size was mildly dilated.   4. The mitral valve is grossly normal. No evidence of mitral valve  regurgitation.   5. The aortic valve was not well visualized. Aortic valve regurgitation  is not visualized. No aortic stenosis is present.  6. The inferior vena cava is normal in size with greater than 50%  respiratory variability, suggesting right atrial pressure of 3 mmHg.   Comparison(s): No prior Echocardiogram.   EKG:   EKG is personally reviewed. 03/19/2022: EKG was not ordered.  11/30/2021: Sinus bradycardia. Rate 55 bpm. First degree heart block. Poor R wave progression.  Recent Labs: 04/09/2021: Hemoglobin 11.6; Platelets 120 10/13/2021: ALT 16; BNP 86.4; BUN 40; Creatinine, Ser 1.95; Potassium 4.3; Sodium 136 11/30/2021: TSH 2.010   Recent Lipid Panel    Component Value Date/Time   CHOL 152 04/09/2021 1104   TRIG 177 (H) 04/09/2021 1104   HDL 41 04/09/2021 1104   CHOLHDL 3.7 04/09/2021 1104   LDLCALC 81 04/09/2021 1104        Physical Exam:    Wt Readings from Last 3 Encounters:  03/19/22 263 lb 11.2 oz (119.6 kg)  11/30/21 270 lb 1.6 oz (122.5 kg)  11/10/21 270 lb (122.5 kg)     VS:  BP 124/70 (BP Location: Left Arm, Patient Position: Sitting, Cuff Size: Large)   Pulse 73   Ht 5\' 6"  (1.676 m)   Wt 263 lb 11.2 oz (119.6 kg)   SpO2 97%   BMI 42.56 kg/m  , BMI Body mass index is 42.56 kg/m. GENERAL:  Well appearing HEENT: Pupils equal round and reactive, fundi not visualized, oral mucosa unremarkable NECK:  No jugular venous distention, waveform within normal  limits, carotid upstroke brisk and symmetric, no bruits, no thyromegaly LUNGS:  Clear to auscultation bilaterally HEART:  RRR.  PMI not displaced or sustained,S1 and S2 within normal limits, no S3, no S4, no clicks, no rubs, no murmurs ABD:  Flat, positive bowel sounds normal in frequency in pitch, no bruits, no rebound, no guarding, no midline pulsatile mass, no hepatomegaly, no splenomegaly EXT:  2 plus pulses throughout, no edema, no cyanosis no clubbing SKIN:  No rashes no nodules NEURO:  Cranial nerves II through XII grossly intact, motor grossly intact throughout PSYCH:  Cognitively intact, oriented to person place and time   ASSESSMENT:    1. Hypercholesterolemia   2. Therapeutic drug monitoring   3. Hypertension with heart disease   4. CKD (chronic kidney disease), stage IV ([place])   5. Pure hypercholesterolemia   6. Coronary artery calcification   7. Aneurysm of ascending aorta without rupture (HCC)     PLAN:    Hypertension with heart disease Blood pressure has been much better controlled.  He was congratulated on working on his exercise.  He was encouraged to continue try to limit the sodium in his diet.  Continue amlodipine, carvedilol, chlorthalidone, and hydralazine.  He is also on furosemide.  Overall his edema has been stable with compression stockings.  Would recommend considering with his nephrologist whether he can take his furosemide as needed as his renal function continues to decline.  CKD (chronic kidney disease), stage IV ([place]) Renal function continues to decline.  He follows with [facility].  Blood pressure is well controlled as above.  Checking a CMP in a week.  Hyperlipidemia He is due for fasting lipids.  Continue rosuvastatin.  LDL goal is less than 70.  Coronary artery calcification Coronary calcium score was 132, which is 63rd percentile.  Continue aspirin and rosuvastatin.  He will come back for fasting lipids and a CMP.  Ascending aortic aneurysm  (HCC) Mild ascending aortic aneurysm.  4.0 cm on CT scan.  Repeat echo is pending.  Blood pressure is well controlled.  Continue carvedilol.    Disposition: FU with Peggyann Zwiefelhofer C. Oval Linsey, MD, [facility] in 6 months.  Medication Adjustments/Labs and Tests Ordered: Current medicines are reviewed at length with the patient today.  Concerns regarding medicines are outlined above.   Orders Placed This Encounter  Procedures   Comprehensive metabolic panel   Lipid panel   No orders of the defined types were placed in this encounter.  Patient Instructions  Medication Instructions:  Your physician recommends that you continue on your current medications as directed. Please refer to the Current Medication list given to you today.   *If you need a refill on your cardiac medications before your next appointment, please call your pharmacy*  Lab Work: FASTING LP/CMET SOON   If you have labs (blood work) drawn today and your tests are completely normal, you will receive your results only by: [place] (if you have MyChart) OR A paper copy in the mail If you have any lab test that is abnormal or we need to change your treatment, we will call you to review the results.  Testing/Procedures: NONE  Follow-Up: At [facility], you and your health needs are our priority.  As part of our continuing mission to provide you with exceptional heart care, we have created designated Provider Care Teams.  These Care Teams include your primary Cardiologist (physician) and Advanced Practice Providers (APPs -  Physician Assistants and Nurse Practitioners) who all work together to provide you with the care you need, when you need it.  We recommend signing up for the patient portal called "MyChart".  Sign up information is provided on this After Visit Summary.  MyChart is used to connect with patients for Virtual Visits (Telemedicine).  Patients are able to view lab/test results, encounter notes, upcoming  appointments, etc.  Non-urgent messages can be sent to your provider as well.   To learn more about what you can do with MyChart, go to [email or web address].    Your next appointment:   6 month(s)  The format for your next appointment:   In Person  Provider:   Skeet Latch, MD{      I,Breanna Adamick,acting as a scribe for Skeet Latch, MD.,have documented all relevant documentation on the behalf of Skeet Latch, MD,as directed by  Skeet Latch, MD while in the presence of Skeet Latch, MD.   I, [place] Oval Linsey, MD have reviewed all documentation for this visit.  The documentation of the exam, diagnosis, procedures, and orders on 03/19/2022 are all accurate and complete.  Raeanne Barry, MD  03/19/2022 9:50 AM    [place]

## 2022-03-19 NOTE — Assessment & Plan Note (Addendum)
He is due for fasting lipids.  Continue rosuvastatin.  LDL goal is less than 70.

## 2022-03-19 NOTE — Assessment & Plan Note (Signed)
Mild ascending aortic aneurysm.  4.0 cm on CT scan.  Repeat echo is pending.  Blood pressure is well controlled.  Continue carvedilol.

## 2022-03-19 NOTE — Assessment & Plan Note (Signed)
Blood pressure has been much better controlled.  He was congratulated on working on his exercise.  He was encouraged to continue try to limit the sodium in his diet.  Continue amlodipine, carvedilol, chlorthalidone, and hydralazine.  He is also on furosemide.  Overall his edema has been stable with compression stockings.  Would recommend considering with his nephrologist whether he can take his furosemide as needed as his renal function continues to decline.

## 2022-03-25 ENCOUNTER — Other Ambulatory Visit: Payer: Self-pay | Admitting: Internal Medicine

## 2022-04-01 ENCOUNTER — Other Ambulatory Visit: Payer: Self-pay | Admitting: Internal Medicine

## 2022-04-13 ENCOUNTER — Encounter: Payer: Medicare PPO | Admitting: Internal Medicine

## 2022-04-13 NOTE — Progress Notes (Deleted)
I,Ketzia Guzek T Shawnika Pepin,acting as a scribe for Maximino Greenland, MD.,have documented all relevant documentation on the behalf of Maximino Greenland, MD,as directed by  Maximino Greenland, MD while in the presence of Maximino Greenland, MD.   Subjective:     Patient ID: Andrew Schmitt , male    DOB: August 01, 1951 , 71 y.o.   MRN: [id number]   Chief Complaint  Patient presents with   Annual Exam    HPI  Patient presents for full physical. He does not have any complaints at this time. He states that he is compliant with medication. He is followed by Dr Skeet Latch as his cardiologist. He sees Dr Avie Echevaria at [facility].   Hypertension This is a chronic problem. The current episode started more than 1 year ago. The problem has been gradually worsening since onset. The problem is uncontrolled. Pertinent negatives include no anxiety. There are no associated agents to hypertension. Risk factors for coronary artery disease include sedentary lifestyle, obesity, male gender, dyslipidemia and diabetes mellitus. Past treatments include calcium channel blockers and diuretics. The current treatment provides no improvement. There are no compliance problems.  There is no history of angina or kidney disease. There is no history of chronic renal disease.     Past Medical History:  Diagnosis Date   Ascending aortic aneurysm ([place]) 03/19/2022   BPH (benign prostatic hyperplasia)    CKD (chronic kidney disease) stage 3, GFR 30-59 ml/min (HCC) 04/05/2019   CKD (chronic kidney disease), stage IV ([place]) 04/05/2019   Coronary artery calcification 03/19/2022   Erectile dysfunction    Gout    Gout    Heart murmur    Hyperlipidemia    Hypertension    Obesity      Family History  Problem Relation Age of Onset   Healthy Mother    Healthy Father      Current Outpatient Medications:    allopurinol (ZYLOPRIM) 300 MG tablet, TAKE 1 TABLET BY MOUTH EVERY DAY, Disp: 30 tablet, Rfl: 1   amLODipine (NORVASC) 10 MG  tablet, TAKE 1 TABLET BY MOUTH EVERY DAY, Disp: 90 tablet, Rfl: 2   aspirin EC 81 MG tablet, Take 81 mg by mouth daily., Disp: , Rfl:    carvedilol (COREG) 6.25 MG tablet, TAKE 1 TABLET BY MOUTH TWICE A DAY WITH FOOD, Disp: 180 tablet, Rfl: 0   chlorthalidone (HYGROTON) 50 MG tablet, Take 50 mg by mouth daily., Disp: , Rfl:    cholecalciferol (VITAMIN D) 1000 units tablet, Take 1,000 Units by mouth daily., Disp: , Rfl:    docusate sodium (COLACE) 100 MG capsule, Take 100 mg by mouth 2 (two) times daily., Disp: , Rfl:    Emollient (AVEENO POSITIVELY AGELESS EX), Apply 1 tablet topically daily., Disp: , Rfl:    finasteride (PROSCAR) 5 MG tablet, TAKE 1 TABLET BY MOUTH EVERY DAY, Disp: 90 tablet, Rfl: 1   fluticasone (FLONASE) 50 MCG/ACT nasal spray, Place 1 spray into both nostrils daily., Disp: 16 g, Rfl: 2   furosemide (LASIX) 20 MG tablet, TAKE 1 TABLET BY MOUTH EVERY DAY, Disp: 90 tablet, Rfl: 1   Garlic 10 MG CAPS, Take by mouth., Disp: , Rfl:    hydrALAZINE (APRESOLINE) 100 MG tablet, TAKE 1 TABLET (100 MG TOTAL) BY MOUTH 3 (THREE) TIMES DAILY., Disp: 90 tablet, Rfl: 0   JARDIANCE 25 MG TABS tablet, Take 25 mg by mouth every morning., Disp: , Rfl:    rosuvastatin (CRESTOR) 10 MG tablet, TAKE  1 TABLET BY MOUTH EVERY DAY, Disp: 90 tablet, Rfl: 1   sildenafil (REVATIO) 20 MG tablet, TAKE 1 TABLET BY MOUTH ONCE DAILY AS NEEDED, Disp: 30 tablet, Rfl: 1   tamsulosin (FLOMAX) 0.4 MG CAPS capsule, TAKE 1 CAPSLE BY MOUTH DAILY, Disp: 90 capsule, Rfl: 3   vitamin B-12 (CYANOCOBALAMIN) 500 MCG tablet, Take 500 mcg by mouth daily., Disp: , Rfl:    Allergies  Allergen Reactions   Ace Inhibitors Cough     Men's preventive visit. Patient Health Questionnaire (PHQ-2) is  Flowsheet Row Clinical Support from 04/23/2021 in [facility]  PHQ-2 Total Score 0     . Patient is on a *** diet. Marital status: Married. Relevant history for alcohol use is:  Social History   Substance and  Sexual Activity  Alcohol Use No  . Relevant history for tobacco use is:  Social History   Tobacco Use  Smoking Status Never  Smokeless Tobacco Never  .   Review of Systems  Constitutional: Negative.   HENT: Negative.    Eyes: Negative.   Respiratory: Negative.    Cardiovascular: Negative.   Gastrointestinal: Negative.   Endocrine: Negative.   Genitourinary: Negative.   Musculoskeletal: Negative.   Skin: Negative.   Allergic/Immunologic: Negative.   Neurological: Negative.   Hematological: Negative.   Psychiatric/Behavioral: Negative.       There were no vitals filed for this visit. There is no height or weight on file to calculate BMI.   Objective:  Physical Exam      Assessment And Plan:    1. Encounter for Medicare annual wellness exam  2. Benign hypertensive renal disease  3. Stage 3b chronic kidney disease ([place])  4. 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 Pure hypercholesterolemia  6. Class 3 severe obesity due to excess calories with serious comorbidity and body mass index (BMI) of 40.0 to 44.9 in adult [facility])     Patient was given opportunity to ask questions. Patient verbalized understanding of the plan and was able to repeat key elements of the plan. All questions were answered to their satisfaction.   Debbora Dus, CMA   I, Debbora Dus, CMA, have reviewed all documentation for this visit. The documentation on 04/13/22 for the exam, diagnosis, procedures, and orders are all accurate and complete.  THE PATIENT IS ENCOURAGED TO PRACTICE SOCIAL DISTANCING DUE TO THE COVID-19 PANDEMIC.

## 2022-04-21 DIAGNOSIS — Z5181 Encounter for therapeutic drug level monitoring: Secondary | ICD-10-CM | POA: Diagnosis not present

## 2022-04-21 DIAGNOSIS — E7800 Pure hypercholesterolemia, unspecified: Secondary | ICD-10-CM | POA: Diagnosis not present

## 2022-04-22 LAB — COMPREHENSIVE METABOLIC PANEL
ALT: 23 IU/L (ref 0–44)
AST: 22 IU/L (ref 0–40)
Albumin/Globulin Ratio: 1.2 (ref 1.2–2.2)
Albumin: 4.3 g/dL (ref 3.8–4.8)
Alkaline Phosphatase: 75 IU/L (ref 44–121)
BUN/Creatinine Ratio: 22 (ref 10–24)
BUN: 45 mg/dL — ABNORMAL HIGH (ref 8–27)
Bilirubin Total: 0.5 mg/dL (ref 0.0–1.2)
CO2: 21 mmol/L (ref 20–29)
Calcium: 9.2 mg/dL (ref 8.6–10.2)
Chloride: 105 mmol/L (ref 96–106)
Creatinine, Ser: 2.06 mg/dL — ABNORMAL HIGH (ref 0.76–1.27)
Globulin, Total: 3.7 g/dL (ref 1.5–4.5)
Glucose: 93 mg/dL (ref 70–99)
Potassium: 4 mmol/L (ref 3.5–5.2)
Sodium: 142 mmol/L (ref 134–144)
Total Protein: 8 g/dL (ref 6.0–8.5)
eGFR: 33 mL/min/{1.73_m2} — ABNORMAL LOW (ref >59)

## 2022-04-22 LAB — LIPID PANEL
Chol/HDL Ratio: 3.5 ratio (ref 0.0–5.0)
Cholesterol, Total: 147 mg/dL (ref 100–199)
HDL: 42 mg/dL (ref >39)
LDL Chol Calc (NIH): 83 mg/dL (ref 0–99)
Triglycerides: 121 mg/dL (ref 0–149)
VLDL Cholesterol Cal: 22 mg/dL (ref 5–40)

## 2022-04-27 ENCOUNTER — Telehealth (HOSPITAL_BASED_OUTPATIENT_CLINIC_OR_DEPARTMENT_OTHER): Payer: Self-pay | Admitting: *Deleted

## 2022-04-27 DIAGNOSIS — E7800 Pure hypercholesterolemia, unspecified: Secondary | ICD-10-CM

## 2022-04-27 DIAGNOSIS — Z5181 Encounter for therapeutic drug level monitoring: Secondary | ICD-10-CM

## 2022-04-27 MED ORDER — ROSUVASTATIN CALCIUM 20 MG PO TABS: 20.0000 mg | ORAL_TABLET | Freq: Every day | ORAL | 3 refills | Status: DC

## 2022-04-27 NOTE — Telephone Encounter (Signed)
-----   Message from Skeet Latch, MD sent at 04/24/2022  8:15 PM EDT ----- Kidney function remains abnormal but stable.  Liver function is normal.  Cholesterol levels are good but her LDL needs to be <70.  Increase rosuvastatin to 20mg  and then repeat lipids/CMP in 2-3 months.

## 2022-04-27 NOTE — Telephone Encounter (Signed)
Advised patient of lab results Mailed lab orders and sent Rx to [facility]

## 2022-05-02 NOTE — Progress Notes (Signed)
No show

## 2022-05-19 ENCOUNTER — Telehealth: Payer: Self-pay

## 2022-05-19 NOTE — Telephone Encounter (Signed)
This nurse attempted to call patient due to missed appointment. Message left to call us back and we will call him again to reschedule.

## 2022-05-29 ENCOUNTER — Other Ambulatory Visit: Payer: Self-pay | Admitting: Internal Medicine

## 2022-05-30 ENCOUNTER — Other Ambulatory Visit: Payer: Self-pay | Admitting: Internal Medicine

## 2022-06-01 ENCOUNTER — Telehealth: Payer: Self-pay | Admitting: Internal Medicine

## 2022-06-01 DIAGNOSIS — N184 Chronic kidney disease, stage 4 (severe): Secondary | ICD-10-CM | POA: Diagnosis not present

## 2022-06-01 NOTE — Telephone Encounter (Signed)
Left message for patient to call back and schedule Medicare Annual Wellness Visit (AWV) either virtually or in office. Left  my Herbie Drape number [phone]   Last AWV 04/23/21 please schedule with Nurse Health Adviser   45 min for awv-i and in office appointments 30 min for awv-s  phone/virtual appointments

## 2022-06-11 DIAGNOSIS — N2581 Secondary hyperparathyroidism of renal origin: Secondary | ICD-10-CM | POA: Diagnosis not present

## 2022-06-11 DIAGNOSIS — I129 Hypertensive chronic kidney disease with stage 1 through stage 4 chronic kidney disease, or unspecified chronic kidney disease: Secondary | ICD-10-CM | POA: Diagnosis not present

## 2022-06-11 DIAGNOSIS — R809 Proteinuria, unspecified: Secondary | ICD-10-CM | POA: Diagnosis not present

## 2022-06-11 DIAGNOSIS — N184 Chronic kidney disease, stage 4 (severe): Secondary | ICD-10-CM | POA: Diagnosis not present

## 2022-06-12 ENCOUNTER — Other Ambulatory Visit: Payer: Self-pay | Admitting: Internal Medicine

## 2022-06-14 ENCOUNTER — Other Ambulatory Visit: Payer: Self-pay | Admitting: Internal Medicine

## 2022-06-16 ENCOUNTER — Ambulatory Visit (INDEPENDENT_AMBULATORY_CARE_PROVIDER_SITE_OTHER): Payer: Medicare PPO

## 2022-06-16 VITALS — Ht 66.0 in | Wt 279.0 lb

## 2022-06-16 DIAGNOSIS — Z0000 Encounter for general adult medical examination without abnormal findings: Secondary | ICD-10-CM

## 2022-06-16 NOTE — Progress Notes (Signed)
I connected with Andrew Schmitt Monday today by telephone and verified that I am speaking with the correct person using two identifiers. Location patient: home Location provider: work Persons participating in the virtual visit: Ariv, Penrod LPN.   I discussed the limitations, risks, security and privacy concerns of performing an evaluation and management service by telephone and the availability of in person appointments. I also discussed with the patient that there may be a patient responsible charge related to this service. The patient expressed understanding and verbally consented to this telephonic visit.    Interactive audio and video telecommunications were attempted between this provider and patient, however failed, due to patient having technical difficulties OR patient did not have access to video capability.  We continued and completed visit with audio only.     Vital signs may be patient reported or missing.  Subjective:   Andrew Schmitt is a 71 y.o. male who presents for Medicare Annual/Subsequent preventive examination.  Review of Systems     Cardiac Risk Factors include: advanced age (>35men, >28 women);dyslipidemia;hypertension;male gender;obesity (BMI >30kg/m2)     Objective:    Today's Vitals   06/16/22 0901  Weight: 279 lb (126.6 kg)  Height: 5\' 6"  (1.676 m)   Body mass index is 45.03 kg/m.     06/16/2022    9:05 AM 04/23/2021    9:01 AM 04/03/2020    9:40 AM 03/29/2019    9:59 AM 11/02/2016   11:14 AM  Advanced Directives  Does Patient Have a Medical Advance Directive? Yes Yes Yes Yes No  Type of Paramedic of [place];Living will [place];Living will [place];Living will [place];Living will   Copy of [place] in Chart? No - copy requested No - copy requested No - copy requested No - copy requested   Would patient like information on creating  a medical advance directive?     No - Patient declined    Current Medications (verified) Outpatient Encounter Medications as of 06/16/2022  Medication Sig   allopurinol (ZYLOPRIM) 300 MG tablet TAKE 1 TABLET BY MOUTH EVERY DAY   amLODipine (NORVASC) 10 MG tablet TAKE 1 TABLET BY MOUTH EVERY DAY   aspirin EC 81 MG tablet Take 81 mg by mouth daily.   carvedilol (COREG) 6.25 MG tablet TAKE 1 TABLET BY MOUTH TWICE A DAY WITH FOOD   chlorthalidone (HYGROTON) 50 MG tablet Take 50 mg by mouth daily.   cholecalciferol (VITAMIN D) 1000 units tablet Take 1,000 Units by mouth daily.   docusate sodium (COLACE) 100 MG capsule Take 100 mg by mouth 2 (two) times daily.   Emollient (AVEENO POSITIVELY AGELESS EX) Apply 1 tablet topically daily.   finasteride (PROSCAR) 5 MG tablet TAKE 1 TABLET BY MOUTH EVERY DAY   fluticasone (FLONASE) 50 MCG/ACT nasal spray Place 1 spray into both nostrils daily.   furosemide (LASIX) 20 MG tablet TAKE 1 TABLET BY MOUTH EVERY DAY   Garlic 10 MG CAPS Take by mouth.   hydrALAZINE (APRESOLINE) 100 MG tablet TAKE 1 TABLET (100 MG TOTAL) BY MOUTH 3 (THREE) TIMES DAILY.   JARDIANCE 25 MG TABS tablet Take 25 mg by mouth every morning.   rosuvastatin (CRESTOR) 20 MG tablet Take 1 tablet (20 mg total) by mouth daily.   sildenafil (REVATIO) 20 MG tablet TAKE 1 TABLET BY MOUTH ONCE DAILY AS NEEDED   tamsulosin (FLOMAX) 0.4 MG CAPS capsule TAKE 1 CAPSLE BY MOUTH  DAILY   vitamin B-12 (CYANOCOBALAMIN) 500 MCG tablet Take 500 mcg by mouth daily.   No facility-administered encounter medications on file as of 06/16/2022.    Allergies (verified) Ace inhibitors   History: Past Medical History:  Diagnosis Date   Ascending aortic aneurysm ([place]) 03/19/2022   BPH (benign prostatic hyperplasia)    CKD (chronic kidney disease) stage 3, GFR 30-59 ml/min (HCC) 04/05/2019   CKD (chronic kidney disease), stage IV ([place]) 04/05/2019   Coronary artery calcification 03/19/2022   Erectile dysfunction     Gout    Gout    Heart murmur    Hyperlipidemia    Hypertension    Obesity    Past Surgical History:  Procedure Laterality Date   CARDIAC SURGERY     Family History  Problem Relation Age of Onset   Healthy Mother    Healthy Father    Social History   Socioeconomic History   Marital status: Married    Spouse name: Not on file   Number of children: 4   Years of education: Not on file   Highest education level: Not on file  Occupational History   Occupation: retired  Tobacco Use   Smoking status: Never   Smokeless tobacco: Never  Vaping Use   Vaping Use: Never used  Substance and Sexual Activity   Alcohol use: No   Drug use: No   Sexual activity: Yes  Other Topics Concern   Not on file  Social History Narrative   Not on file   Social Determinants of Health   Financial Resource Strain: Low Risk  (06/16/2022)   Overall Financial Resource Strain (CARDIA)    Difficulty of Paying Living Expenses: Not hard at all  Food Insecurity: No Food Insecurity (06/16/2022)   Hunger Vital Sign    Worried About Running Out of Food in the Last Year: Never true    Ran Out of Food in the Last Year: Never true  Transportation Needs: No Transportation Needs (06/16/2022)   PRAPARE - Hydrologist (Medical): No    Lack of Transportation (Non-Medical): No  Physical Activity: Sufficiently Active (06/16/2022)   Exercise Vital Sign    Days of Exercise per Week: 5 days    Minutes of Exercise per Session: 30 min  Stress: No Stress Concern Present (06/16/2022)   [place]    Feeling of Stress : Not at all  Social Connections: Not on file    Tobacco Counseling Counseling given: Not Answered   Clinical Intake:  Pre-visit preparation completed: Yes  Pain : No/denies pain     Nutritional Status: BMI > 30  Obese Nutritional Risks: None Diabetes: No  How often do you need to have someone  help you when you read instructions, pamphlets, or other written materials from your doctor or pharmacy?: 1 - Never  Diabetic? no  Interpreter Needed?: No  Information entered by :: NAllen LPN   Activities of Daily Living    06/16/2022    9:06 AM  In your present state of health, do you have any difficulty performing the following activities:  Hearing? 0  Vision? 0  Difficulty concentrating or making decisions? 0  Walking or climbing stairs? 0  Dressing or bathing? 0  Doing errands, shopping? 0  Preparing Food and eating ? N  Using the Toilet? N  In the past six months, have you accidently leaked urine? Y  Do you have problems with  loss of bowel control? N  Managing your Medications? N  Managing your Finances? N  Housekeeping or managing your Housekeeping? N    Patient Care Team: Glendale Chard, MD as PCP - General (Internal Medicine) Skeet Latch, MD as Attending Physician (Cardiology)  Indicate any recent Medical Services you may have received from other than [facility] providers in the past year (date may be approximate).     Assessment:   This is a routine wellness examination for [facility] [place].  Hearing/Vision screen Vision Screening - Comments:: No regular eye exams,   Dietary issues and exercise activities discussed: Current Exercise Habits: Home exercise routine, Type of exercise: walking, Time (Minutes): 30, Frequency (Times/Week): 5, Weekly Exercise (Minutes/Week): 150   Goals Addressed             This Visit's Progress    Patient Stated       06/16/2022, wants to lose weight       Depression Screen    06/16/2022    9:06 AM 04/23/2021    9:02 AM 04/09/2021    9:48 AM 04/03/2020    9:41 AM 04/02/2020   10:12 AM 03/29/2019   10:00 AM 01/18/2019   11:31 AM  PHQ 2/9 Scores  PHQ - 2 Score 0 0 0 0 0 0 0  PHQ- 9 Score   0 0  0     Fall Risk    06/16/2022    9:06 AM 04/23/2021    9:02 AM 04/09/2021    9:48 AM 04/03/2020    9:41 AM 10/09/2019   10:46 AM  Fall  Risk   Falls in the past year? 0 0 0 0 0  Number falls in past yr: 0  0    Injury with Fall? 0  0    Risk for fall due to : Medication side effect Medication side effect  Medication side effect   Follow up Falls prevention discussed;Education provided;Falls evaluation completed Falls evaluation completed;Education provided;Falls prevention discussed  Falls evaluation completed;Education provided;Falls prevention discussed     FALL RISK PREVENTION PERTAINING TO THE HOME:  Any stairs in or around the home? Yes  If so, are there any without handrails? No  Home free of loose throw rugs in walkways, pet beds, electrical cords, etc? Yes  Adequate lighting in your home to reduce risk of falls? Yes   ASSISTIVE DEVICES UTILIZED TO PREVENT FALLS:  Life alert? No  Use of a cane, walker or w/c? No  Grab bars in the bathroom? Yes  Shower chair or bench in shower? No  Elevated toilet seat or a handicapped toilet? No   TIMED UP AND GO:  Was the test performed? No .      Cognitive Function:        06/16/2022    9:07 AM 04/23/2021    9:03 AM 04/03/2020    9:42 AM 03/29/2019   10:03 AM  6CIT Screen  What Year? 0 points 0 points 0 points 0 points  What month? 0 points 0 points 0 points 0 points  What time? 0 points 0 points 0 points 0 points  Count back from 20 0 points 0 points 0 points 0 points  Months in reverse 0 points 0 points 2 points 2 points  Repeat phrase 0 points 2 points 0 points 0 points  Total Score 0 points 2 points 2 points 2 points    Immunizations Immunization History  Administered Date(s) Administered   Moderna Covid-19 Vaccine Bivalent Booster 51yrs & up  10/13/2021   Moderna Sars-Covid-2 Vaccination 08/24/2019, 09/21/2019   PNEUMOCOCCAL CONJUGATE-20 11/10/2021    TDAP status: Due, Education has been provided regarding the importance of this vaccine. Advised may receive this vaccine at local pharmacy or Health Dept. Aware to provide a copy of the vaccination record  if obtained from local pharmacy or Health Dept. Verbalized acceptance and understanding.  Flu Vaccine status: Declined, Education has been provided regarding the importance of this vaccine but patient still declined. Advised may receive this vaccine at local pharmacy or Health Dept. Aware to provide a copy of the vaccination record if obtained from local pharmacy or Health Dept. Verbalized acceptance and understanding.  Pneumococcal vaccine status: Up to date  Covid-19 vaccine status: Completed vaccines  Qualifies for Shingles Vaccine? Yes   Zostavax completed No   Shingrix Completed?: No.    Education has been provided regarding the importance of this vaccine. Patient has been advised to call insurance company to determine out of pocket expense if they have not yet received this vaccine. Advised may also receive vaccine at local pharmacy or Health Dept. Verbalized acceptance and understanding.  Screening Tests Health Maintenance  Topic Date Due   Zoster Vaccines- Shingrix (1 of 2) Never done   COVID-19 Vaccine (4 - Moderna series) 02/10/2022   INFLUENZA VACCINE  Never done   Medicare Annual Wellness (AWV)  04/23/2022   TETANUS/TDAP  10/13/2022 (Originally 08/05/2021)   COLONOSCOPY (Pts 45-69yrs Insurance coverage will need to be confirmed)  05/05/2031   Pneumonia Vaccine 60+ Years old  Completed   Hepatitis C Screening  Completed   HPV VACCINES  Aged Out    Health Maintenance  Health Maintenance Due  Topic Date Due   Zoster Vaccines- Shingrix (1 of 2) Never done   COVID-19 Vaccine (4 - Moderna series) 02/10/2022   INFLUENZA VACCINE  Never done   Medicare Annual Wellness (AWV)  04/23/2022    Colorectal cancer screening: Type of screening: Colonoscopy. Completed 05/04/2021. Repeat every 5 years  Lung Cancer Screening: (Low Dose CT Chest recommended if Age 65-80 years, 30 pack-year currently smoking OR have quit w/in 15years.) does not qualify.   Lung Cancer Screening Referral:  no  Additional Screening:  Hepatitis C Screening: does qualify; Completed 11/16/2012  Vision Screening: Recommended annual ophthalmology exams for early detection of glaucoma and other disorders of the eye. Is the patient up to date with their annual eye exam?  No  Who is the provider or what is the name of the office in which the patient attends annual eye exams? none If pt is not established with a provider, would they like to be referred to a provider to establish care? No .   Dental Screening: Recommended annual dental exams for proper oral hygiene  Community Resource Referral / Chronic Care Management: CRR required this visit?  No   CCM required this visit?  No      Plan:     I have personally reviewed and noted the following in the patient's chart:   Medical and social history Use of alcohol, tobacco or illicit drugs  Current medications and supplements including opioid prescriptions. Patient is not currently taking opioid prescriptions. Functional ability and status Nutritional status Physical activity Advanced directives List of other physicians Hospitalizations, surgeries, and ER visits in previous 12 months Vitals Screenings to include cognitive, depression, and falls Referrals and appointments  In addition, I have reviewed and discussed with patient certain preventive protocols, quality metrics, and best practice recommendations. A written  personalized care plan for preventive services as well as general preventive health recommendations were provided to patient.     Kellie Simmering, LPN   [id number]   Nurse Notes: none  Due to this being a virtual visit, the after visit summary with patients personalized plan was offered to patient via mail or my-chart.  to pick up at office at next visit

## 2022-06-16 NOTE — Patient Instructions (Signed)
Andrew Schmitt , Thank you for taking time to come for your Medicare Wellness Visit. I appreciate your ongoing commitment to your health goals. Please review the following plan we discussed and let me know if I can assist you in the future.   Screening recommendations/referrals: Colonoscopy: completed 05/04/2021, due 05/04/2026 Recommended yearly ophthalmology/optometry visit for glaucoma screening and checkup Recommended yearly dental visit for hygiene and checkup  Vaccinations: Influenza vaccine: decline Pneumococcal vaccine: completed 11/10/2021 Tdap vaccine: decline Shingles vaccine: discussed   Covid-19:  10/13/2021, 09/21/2019, 08/24/2019  Advanced directives: Please bring a copy of your POA (Power of Attorney) and/or Living Will to your next appointment.   Conditions/risks identified: none  Next appointment: Follow up in one year for your annual wellness visit.   Preventive Care 37 Years and Older, Male Preventive care refers to lifestyle choices and visits with your health care provider that can promote health and wellness. What does preventive care include? A yearly physical exam. This is also called an annual well check. Dental exams once or twice a year. Routine eye exams. Ask your health care provider how often you should have your eyes checked. Personal lifestyle choices, including: Daily care of your teeth and gums. Regular physical activity. Eating a healthy diet. Avoiding tobacco and drug use. Limiting alcohol use. Practicing safe sex. Taking low doses of aspirin every day. Taking vitamin and mineral supplements as recommended by your health care provider. What happens during an annual well check? The services and screenings done by your health care provider during your annual well check will depend on your age, overall health, lifestyle risk factors, and family history of disease. Counseling  Your health care provider may ask you questions about your: Alcohol  use. Tobacco use. Drug use. Emotional well-being. Home and relationship well-being. Sexual activity. Eating habits. History of falls. Memory and ability to understand (cognition). Work and work Statistician. Screening  You may have the following tests or measurements: Height, weight, and BMI. Blood pressure. Lipid and cholesterol levels. These may be checked every 5 years, or more frequently if you are over 23 years old. Skin check. Lung cancer screening. You may have this screening every year starting at age 70 if you have a 30-pack-year history of smoking and currently smoke or have quit within the past 15 years. Fecal occult blood test (FOBT) of the stool. You may have this test every year starting at age 56. Flexible sigmoidoscopy or colonoscopy. You may have a sigmoidoscopy every 5 years or a colonoscopy every 10 years starting at age 23. Prostate cancer screening. Recommendations will vary depending on your family history and other risks. Hepatitis C blood test. Hepatitis B blood test. Sexually transmitted disease (STD) testing. Diabetes screening. This is done by checking your blood sugar (glucose) after you have not eaten for a while (fasting). You may have this done every 1-3 years. Abdominal aortic aneurysm (AAA) screening. You may need this if you are a current or former smoker. Osteoporosis. You may be screened starting at age 39 if you are at high risk. Talk with your health care provider about your test results, treatment options, and if necessary, the need for more tests. Vaccines  Your health care provider may recommend certain vaccines, such as: Influenza vaccine. This is recommended every year. Tetanus, diphtheria, and acellular pertussis (Tdap, Td) vaccine. You may need a Td booster every 10 years. Zoster vaccine. You may need this after age 28. Pneumococcal 13-valent conjugate (PCV13) vaccine. One dose is recommended after age  65. Pneumococcal polysaccharide  (PPSV23) vaccine. One dose is recommended after age 34. Talk to your health care provider about which screenings and vaccines you need and how often you need them. This information is not intended to replace advice given to you by your health care provider. Make sure you discuss any questions you have with your health care provider. Document Released: 08/29/2015 Document Revised: 04/21/2016 Document Reviewed: 06/03/2015 Elsevier Interactive Patient Education  2017 [place] Prevention in the Home Falls can cause injuries. They can happen to people of all ages. There are many things you can do to make your home safe and to help prevent falls. What can I do on the outside of my home? Regularly fix the edges of walkways and driveways and fix any cracks. Remove anything that might make you trip as you walk through a door, such as a raised step or threshold. Trim any bushes or trees on the path to your home. Use bright outdoor lighting. Clear any walking paths of anything that might make someone trip, such as rocks or tools. Regularly check to see if handrails are loose or broken. Make sure that both sides of any steps have handrails. Any raised decks and porches should have guardrails on the edges. Have any leaves, snow, or ice cleared regularly. Use sand or salt on walking paths during winter. Clean up any spills in your garage right away. This includes oil or grease spills. What can I do in the bathroom? Use night lights. Install grab bars by the toilet and in the tub and shower. Do not use towel bars as grab bars. Use non-skid mats or decals in the tub or shower. If you need to sit down in the shower, use a plastic, non-slip stool. Keep the floor dry. Clean up any water that spills on the floor as soon as it happens. Remove soap buildup in the tub or shower regularly. Attach bath mats securely with double-sided non-slip rug tape. Do not have throw rugs and other things on the  floor that can make you trip. What can I do in the bedroom? Use night lights. Make sure that you have a light by your bed that is easy to reach. Do not use any sheets or blankets that are too big for your bed. They should not hang down onto the floor. Have a firm chair that has side arms. You can use this for support while you get dressed. Do not have throw rugs and other things on the floor that can make you trip. What can I do in the kitchen? Clean up any spills right away. Avoid walking on wet floors. Keep items that you use a lot in easy-to-reach places. If you need to reach something above you, use a strong step stool that has a grab bar. Keep electrical cords out of the way. Do not use floor polish or wax that makes floors slippery. If you must use wax, use non-skid floor wax. Do not have throw rugs and other things on the floor that can make you trip. What can I do with my stairs? Do not leave any items on the stairs. Make sure that there are handrails on both sides of the stairs and use them. Fix handrails that are broken or loose. Make sure that handrails are as long as the stairways. Check any carpeting to make sure that it is firmly attached to the stairs. Fix any carpet that is loose or worn. Avoid having throw rugs at  the top or bottom of the stairs. If you do have throw rugs, attach them to the floor with carpet tape. Make sure that you have a light switch at the top of the stairs and the bottom of the stairs. If you do not have them, ask someone to add them for you. What else can I do to help prevent falls? Wear shoes that: Do not have high heels. Have rubber bottoms. Are comfortable and fit you well. Are closed at the toe. Do not wear sandals. If you use a stepladder: Make sure that it is fully opened. Do not climb a closed stepladder. Make sure that both sides of the stepladder are locked into place. Ask someone to hold it for you, if possible. Clearly mark and make  sure that you can see: Any grab bars or handrails. First and last steps. Where the edge of each step is. Use tools that help you move around (mobility aids) if they are needed. These include: Canes. Walkers. Scooters. Crutches. Turn on the lights when you go into a dark area. Replace any light bulbs as soon as they burn out. Set up your furniture so you have a clear path. Avoid moving your furniture around. If any of your floors are uneven, fix them. If there are any pets around you, be aware of where they are. Review your medicines with your doctor. Some medicines can make you feel dizzy. This can increase your chance of falling. Ask your doctor what other things that you can do to help prevent falls. This information is not intended to replace advice given to you by your health care provider. Make sure you discuss any questions you have with your health care provider. Document Released: 05/29/2009 Document Revised: 01/08/2016 Document Reviewed: 09/06/2014 Elsevier Interactive Patient Education  2017 [employer].

## 2022-06-18 DIAGNOSIS — I10 Essential (primary) hypertension: Secondary | ICD-10-CM | POA: Diagnosis not present

## 2022-06-29 DIAGNOSIS — N184 Chronic kidney disease, stage 4 (severe): Secondary | ICD-10-CM | POA: Diagnosis not present

## 2022-06-29 DIAGNOSIS — I10 Essential (primary) hypertension: Secondary | ICD-10-CM | POA: Diagnosis not present

## 2022-07-13 ENCOUNTER — Other Ambulatory Visit: Payer: Self-pay | Admitting: Internal Medicine

## 2022-08-11 ENCOUNTER — Other Ambulatory Visit: Payer: Self-pay | Admitting: Internal Medicine

## 2022-09-02 ENCOUNTER — Other Ambulatory Visit: Payer: Self-pay | Admitting: Urology

## 2022-09-10 ENCOUNTER — Other Ambulatory Visit: Payer: Self-pay | Admitting: Internal Medicine

## 2022-09-12 ENCOUNTER — Other Ambulatory Visit: Payer: Self-pay | Admitting: Internal Medicine

## 2022-09-21 DIAGNOSIS — N184 Chronic kidney disease, stage 4 (severe): Secondary | ICD-10-CM | POA: Diagnosis not present

## 2022-09-21 DIAGNOSIS — N2581 Secondary hyperparathyroidism of renal origin: Secondary | ICD-10-CM | POA: Diagnosis not present

## 2022-09-21 DIAGNOSIS — I129 Hypertensive chronic kidney disease with stage 1 through stage 4 chronic kidney disease, or unspecified chronic kidney disease: Secondary | ICD-10-CM | POA: Diagnosis not present

## 2022-09-21 DIAGNOSIS — R809 Proteinuria, unspecified: Secondary | ICD-10-CM | POA: Diagnosis not present

## 2022-10-06 DIAGNOSIS — I129 Hypertensive chronic kidney disease with stage 1 through stage 4 chronic kidney disease, or unspecified chronic kidney disease: Secondary | ICD-10-CM | POA: Diagnosis not present

## 2022-11-08 ENCOUNTER — Other Ambulatory Visit: Payer: Self-pay | Admitting: Internal Medicine

## 2022-12-03 ENCOUNTER — Other Ambulatory Visit: Payer: Self-pay | Admitting: Internal Medicine

## 2022-12-07 ENCOUNTER — Encounter: Payer: Self-pay | Admitting: Internal Medicine

## 2022-12-07 ENCOUNTER — Ambulatory Visit: Payer: Medicare PPO | Admitting: Internal Medicine

## 2022-12-07 VITALS — BP 130/84 | HR 70 | Temp 98.4°F | Ht 66.0 in | Wt 261.2 lb

## 2022-12-07 DIAGNOSIS — N1832 Chronic kidney disease, stage 3b: Secondary | ICD-10-CM | POA: Diagnosis not present

## 2022-12-07 DIAGNOSIS — N2581 Secondary hyperparathyroidism of renal origin: Secondary | ICD-10-CM | POA: Diagnosis not present

## 2022-12-07 DIAGNOSIS — M1A30X0 Chronic gout due to renal impairment, unspecified site, without tophus (tophi): Secondary | ICD-10-CM

## 2022-12-07 DIAGNOSIS — I2584 Coronary atherosclerosis due to calcified coronary lesion: Secondary | ICD-10-CM | POA: Diagnosis not present

## 2022-12-07 DIAGNOSIS — I7121 Aneurysm of the ascending aorta, without rupture: Secondary | ICD-10-CM

## 2022-12-07 DIAGNOSIS — I129 Hypertensive chronic kidney disease with stage 1 through stage 4 chronic kidney disease, or unspecified chronic kidney disease: Secondary | ICD-10-CM

## 2022-12-07 DIAGNOSIS — E66813 Obesity, class 3: Secondary | ICD-10-CM

## 2022-12-07 DIAGNOSIS — E7800 Pure hypercholesterolemia, unspecified: Secondary | ICD-10-CM | POA: Diagnosis not present

## 2022-12-07 DIAGNOSIS — I2510 Atherosclerotic heart disease of native coronary artery without angina pectoris: Secondary | ICD-10-CM

## 2022-12-07 DIAGNOSIS — Z6841 Body Mass Index (BMI) 40.0 and over, adult: Secondary | ICD-10-CM

## 2022-12-07 DIAGNOSIS — Z23 Encounter for immunization: Secondary | ICD-10-CM

## 2022-12-07 NOTE — Patient Instructions (Signed)
Hypertension, Adult ?Hypertension is another name for high blood pressure. High blood pressure forces your heart to work harder to pump blood. This can cause problems over time. ?There are two numbers in a blood pressure reading. There is a top number (systolic) over a bottom number (diastolic). It is best to have a blood pressure that is below 120/80. ?What are the causes? ?The cause of this condition is not known. Some other conditions can lead to high blood pressure. ?What increases the risk? ?Some lifestyle factors can make you more likely to develop high blood pressure: ?Smoking. ?Not getting enough exercise or physical activity. ?Being overweight. ?Having too much fat, sugar, calories, or salt (sodium) in your diet. ?Drinking too much alcohol. ?Other risk factors include: ?Having any of these conditions: ?Heart disease. ?Diabetes. ?High cholesterol. ?Kidney disease. ?Obstructive sleep apnea. ?Having a family history of high blood pressure and high cholesterol. ?Age. The risk increases with age. ?Stress. ?What are the signs or symptoms? ?High blood pressure may not cause symptoms. Very high blood pressure (hypertensive crisis) may cause: ?Headache. ?Fast or uneven heartbeats (palpitations). ?Shortness of breath. ?Nosebleed. ?Vomiting or feeling like you may vomit (nauseous). ?Changes in how you see. ?Very bad chest pain. ?Feeling dizzy. ?Seizures. ?How is this treated? ?This condition is treated by making healthy lifestyle changes, such as: ?Eating healthy foods. ?Exercising more. ?Drinking less alcohol. ?Your doctor may prescribe medicine if lifestyle changes do not help enough and if: ?Your top number is above 130. ?Your bottom number is above 80. ?Your personal target blood pressure may vary. ?Follow these instructions at home: ?Eating and drinking ? ?If told, follow the DASH eating plan. To follow this plan: ?Fill one half of your plate at each meal with fruits and vegetables. ?Fill one fourth of your plate  at each meal with whole grains. Whole grains include whole-wheat pasta, brown rice, and whole-grain bread. ?Eat or drink low-fat dairy products, such as skim milk or low-fat yogurt. ?Fill one fourth of your plate at each meal with low-fat (lean) proteins. Low-fat proteins include fish, chicken without skin, eggs, beans, and tofu. ?Avoid fatty meat, cured and processed meat, or chicken with skin. ?Avoid pre-made or processed food. ?Limit the amount of salt in your diet to less than 1,500 mg each day. ?Do not drink alcohol if: ?Your doctor tells you not to drink. ?You are pregnant, may be pregnant, or are planning to become pregnant. ?If you drink alcohol: ?Limit how much you have to: ?0-1 drink a day for women. ?0-2 drinks a day for men. ?Know how much alcohol is in your drink. In the [place] one drink equals one 12 oz bottle of beer (355 mL), one 5 oz glass of wine (148 mL), or one 1? oz glass of hard liquor (44 mL). ?Lifestyle ? ?Work with your doctor to stay at a healthy weight or to lose weight. Ask your doctor what the best weight is for you. ?Get at least 30 minutes of exercise that causes your heart to beat faster (aerobic exercise) most days of the week. This may include walking, swimming, or biking. ?Get at least 30 minutes of exercise that strengthens your muscles (resistance exercise) at least 3 days a week. This may include lifting weights or doing Pilates. ?Do not smoke or use any products that contain nicotine or tobacco. If you need help quitting, ask your doctor. ?Check your blood pressure at home as told by your doctor. ?Keep all follow-up visits. ?Medicines ?Take over-the-counter and prescription medicines   only as told by your doctor. Follow directions carefully. ?Do not skip doses of blood pressure medicine. The medicine does not work as well if you skip doses. Skipping doses also puts you at risk for problems. ?Ask your doctor about side effects or reactions to medicines that you should watch  for. ?Contact a doctor if: ?You think you are having a reaction to the medicine you are taking. ?You have headaches that keep coming back. ?You feel dizzy. ?You have swelling in your ankles. ?You have trouble with your vision. ?Get help right away if: ?You get a very bad headache. ?You start to feel mixed up (confused). ?You feel weak or numb. ?You feel faint. ?You have very bad pain in your: ?Chest. ?Belly (abdomen). ?You vomit more than once. ?You have trouble breathing. ?These symptoms may be an emergency. Get help right away. Call 911. ?Do not wait to see if the symptoms will go away. ?Do not drive yourself to the hospital. ?Summary ?Hypertension is another name for high blood pressure. ?High blood pressure forces your heart to work harder to pump blood. ?For most people, a normal blood pressure is less than 120/80. ?Making healthy choices can help lower blood pressure. If your blood pressure does not get lower with healthy choices, you may need to take medicine. ?This information is not intended to replace advice given to you by your health care provider. Make sure you discuss any questions you have with your health care provider. ?Document Revised: 05/21/2021 Document Reviewed: 05/21/2021 ?Elsevier Patient Education ? 2023 Else[employer] Inc[employer] ? ?

## 2022-12-07 NOTE — Progress Notes (Incomplete)
I,Victoria T Hamilton,acting as a scribe for Gwynneth Aliment, MD.,have documented all relevant documentation on the behalf of Gwynneth Aliment, MD,as directed by  Gwynneth Aliment, MD while in the presence of Gwynneth Aliment, MD.    Subjective:     Patient ID: Andrew Schmitt , male    DOB: 09/23/1950 , 72 y.o.   MRN: [id number]   Chief Complaint  Patient presents with  . Hypertension  . Hyperlipidemia    HPI  He is here today for bp check. He reports compliance with meds. He denies headaches, chest pain and palpitations. He has no specific concerns or complaints at this time.   [facility] approved Shingrix.    Hypertension This is a chronic problem. The current episode started more than 1 year ago. The problem has been gradually improving since onset. Pertinent negatives include no blurred vision, chest pain, palpitations or shortness of breath. Risk factors for coronary artery disease include obesity, sedentary lifestyle, male gender and dyslipidemia. Past treatments include direct vasodilators.  Hyperlipidemia Pertinent negatives include no chest pain or shortness of breath.     Past Medical History:  Diagnosis Date  . Ascending aortic aneurysm 03/19/2022  . BPH (benign prostatic hyperplasia)   . CKD (chronic kidney disease) stage 3, GFR 30-59 ml/min 04/05/2019  . CKD (chronic kidney disease), stage IV 04/05/2019  . Coronary artery calcification 03/19/2022  . Erectile dysfunction   . Gout   . Gout   . Heart murmur   . Hyperlipidemia   . Hypertension   . Obesity      Family History  Problem Relation Age of Onset  . Healthy Mother   . Healthy Father      Current Outpatient Medications:  .  allopurinol (ZYLOPRIM) 300 MG tablet, TAKE 1 TABLET BY MOUTH EVERY DAY, Disp: 30 tablet, Rfl: 1 .  amLODipine (NORVASC) 10 MG tablet, Take 1 tablet (10 mg total) by mouth daily. PLEASE CALL OFFICE TO SCHEDULE APPT., Disp: 30 tablet, Rfl: 1 .  aspirin EC 81 MG tablet, Take 81 mg by  mouth daily., Disp: , Rfl:  .  carvedilol (COREG) 6.25 MG tablet, TAKE 1 TABLET BY MOUTH TWICE A DAY WITH FOOD, Disp: 180 tablet, Rfl: 0 .  chlorthalidone (HYGROTON) 50 MG tablet, Take 50 mg by mouth daily., Disp: , Rfl:  .  cholecalciferol (VITAMIN D) 1000 units tablet, Take 1,000 Units by mouth daily., Disp: , Rfl:  .  docusate sodium (COLACE) 100 MG capsule, Take 100 mg by mouth 2 (two) times daily., Disp: , Rfl:  .  Emollient (AVEENO POSITIVELY AGELESS EX), Apply 1 tablet topically daily., Disp: , Rfl:  .  furosemide (LASIX) 20 MG tablet, TAKE 1 TABLET BY MOUTH EVERY DAY, Disp: 90 tablet, Rfl: 1 .  Garlic 10 MG CAPS, Take by mouth., Disp: , Rfl:  .  hydrALAZINE (APRESOLINE) 100 MG tablet, TAKE 1 TABLET (100 MG TOTAL) BY MOUTH 3 (THREE) TIMES DAILY., Disp: 90 tablet, Rfl: 0 .  JARDIANCE 25 MG TABS tablet, Take 25 mg by mouth every morning., Disp: , Rfl:  .  rosuvastatin (CRESTOR) 20 MG tablet, Take 1 tablet (20 mg total) by mouth daily., Disp: 90 tablet, Rfl: 3 .  sildenafil (REVATIO) 20 MG tablet, TAKE 1 TABLET BY MOUTH ONCE DAILY AS NEEDED, Disp: 30 tablet, Rfl: 0 .  tamsulosin (FLOMAX) 0.4 MG CAPS capsule, TAKE 1 CAPSLE BY MOUTH DAILY, Disp: 90 capsule, Rfl: 3 .  vitamin B-12 (CYANOCOBALAMIN) 500 MCG tablet,  Take 500 mcg by mouth daily., Disp: , Rfl:  .  finasteride (PROSCAR) 5 MG tablet, TAKE 1 TABLET BY MOUTH EVERY DAY (Patient not taking: Reported on 12/07/2022), Disp: 90 tablet, Rfl: 1 .  fluticasone (FLONASE) 50 MCG/ACT nasal spray, Place 1 spray into both nostrils daily., Disp: 16 g, Rfl: 2   Allergies  Allergen Reactions  . Ace Inhibitors Cough     Review of Systems  Constitutional: Negative.   HENT: Negative.    Eyes:  Negative for blurred vision.  Respiratory:  Negative for shortness of breath.   Cardiovascular: Negative.  Negative for chest pain and palpitations.  Endocrine: Negative.   Musculoskeletal: Negative.   Skin: Negative.   Allergic/Immunologic: Negative.    Neurological: Negative.   Hematological: Negative.      Today's Vitals   12/07/22 1058  BP: 130/84  Pulse: 70  Temp: 98.4 F (36.9 C)  SpO2: 98%  Weight: 261 lb 3.2 oz (118.5 kg)  Height:  (1.676 m)   Body mass index is 42.16 kg/m.  Wt Readings from Last 3 Encounters:  12/07/22 261 lb 3.2 oz (118.5 kg)  06/16/22 279 lb (126.6 kg)  03/19/22 263 lb 11.2 oz (119.6 kg)    Objective:  Physical Exam Vitals and nursing note reviewed.  Constitutional:      Appearance: Normal appearance. He is obese.  HENT:     Head: Normocephalic and atraumatic.  Eyes:     Extraocular Movements: Extraocular movements intact.  Cardiovascular:     Rate and Rhythm: Normal rate and regular rhythm.     Heart sounds: Normal heart sounds.  Pulmonary:     Effort: Pulmonary effort is normal.     Breath sounds: Normal breath sounds.  Abdominal:     General: Bowel sounds are normal.     Palpations: Abdomen is soft.     Comments: Rounded, obese  Musculoskeletal:     Cervical back: Normal range of motion.  Skin:    General: Skin is warm.  Neurological:     General: No focal deficit present.     Mental Status: He is alert.  Psychiatric:        Mood and Affect: Mood normal.         Assessment And Plan:     1. Benign hypertensive renal disease Comments: Chronic, fair control. Advised BP goal < 120/80. He will c/w amlodipine , carvedilol bid, chlorthalidone qd, hydralazine tid, and furosemide qd. - Lipid panel - CBC  2. Stage 3b chronic kidney disease - PTH, intact and calcium - Phosphorus - Protein electrophoresis, serum  3. Pure hypercholesterolemia - Lipid panel  4. Coronary artery calcification  5. Secondary renal hyperparathyroidism  6. Chronic gout due to renal impairment without tophus, unspecified site - Uric acid  7. Aneurysm of ascending aorta without rupture  8. Class 3 severe obesity due to excess calories with serious comorbidity and body mass index (BMI) of  40.0 to 44.9 in adult Comments: BMI 42. He is encouraged to initially strive for BMI less than 38 to decrease cardiac risk. Advised to aim for at least 150 minutes of exercise per week.  9. Need for shingles vaccine Comments: He was given Shingrix, billed via [facility]. He will rto in 3 months for his next injection. - Zoster Recombinant (Shingrix )  Patient was given opportunity to ask questions. Patient verbalized understanding of the plan and was able to repeat key elements of the plan. All questions were answered to their satisfaction.  I, Gwynneth Aliment, MD, have reviewed all documentation for this visit. The documentation on 12/07/22 for the exam, diagnosis, procedures, and orders are all accurate and complete.   IF YOU HAVE BEEN REFERRED TO A SPECIALIST, IT MAY TAKE 1-2 WEEKS TO SCHEDULE/PROCESS THE REFERRAL. IF YOU HAVE NOT HEARD FROM US/SPECIALIST IN TWO WEEKS, PLEASE GIVE [place] A CALL AT [phone].   THE PATIENT IS ENCOURAGED TO PRACTICE SOCIAL DISTANCING DUE TO THE COVID-19 PANDEMIC.

## 2022-12-07 NOTE — Progress Notes (Unsigned)
I,Victoria T Hamilton,acting as a scribe for Gwynneth Aliment, MD.,have documented all relevant documentation on the behalf of Gwynneth Aliment, MD,as directed by  Gwynneth Aliment, MD while in the presence of Gwynneth Aliment, MD.    Subjective:     Patient ID: Andrew Schmitt , male    DOB: 1951/03/08 , 72 y.o.   MRN: [id number]   Chief Complaint  Patient presents with   Hypertension   Hyperlipidemia    HPI  He is here today for bp check. He reports compliance with meds. He denies headaches, chest pain and palpitations. He has no specific concerns or complaints at this time.  [facility] approved shingles. Patient approves getting 1st shingles today.     Hypertension This is a chronic problem. The current episode started more than 1 year ago. The problem has been gradually improving since onset. Pertinent negatives include no blurred vision, chest pain, palpitations or shortness of breath. Risk factors for coronary artery disease include obesity, sedentary lifestyle, male gender and dyslipidemia. Past treatments include direct vasodilators.  Hyperlipidemia Pertinent negatives include no chest pain or shortness of breath.     Past Medical History:  Diagnosis Date   Ascending aortic aneurysm 03/19/2022   BPH (benign prostatic hyperplasia)    CKD (chronic kidney disease) stage 3, GFR 30-59 ml/min 04/05/2019   CKD (chronic kidney disease), stage IV 04/05/2019   Coronary artery calcification 03/19/2022   Erectile dysfunction    Gout    Gout    Heart murmur    Hyperlipidemia    Hypertension    Obesity      Family History  Problem Relation Age of Onset   Healthy Mother    Healthy Father      Current Outpatient Medications:    allopurinol (ZYLOPRIM) 300 MG tablet, TAKE 1 TABLET BY MOUTH EVERY DAY, Disp: 30 tablet, Rfl: 1   amLODipine (NORVASC) 10 MG tablet, Take 1 tablet (10 mg total) by mouth daily. PLEASE CALL OFFICE TO SCHEDULE APPT., Disp: 30 tablet, Rfl: 1   aspirin EC 81 MG  tablet, Take 81 mg by mouth daily., Disp: , Rfl:    carvedilol (COREG) 6.25 MG tablet, TAKE 1 TABLET BY MOUTH TWICE A DAY WITH FOOD, Disp: 180 tablet, Rfl: 0   chlorthalidone (HYGROTON) 50 MG tablet, Take 50 mg by mouth daily., Disp: , Rfl:    cholecalciferol (VITAMIN D) 1000 units tablet, Take 1,000 Units by mouth daily., Disp: , Rfl:    docusate sodium (COLACE) 100 MG capsule, Take 100 mg by mouth 2 (two) times daily., Disp: , Rfl:    Emollient (AVEENO POSITIVELY AGELESS EX), Apply 1 tablet topically daily., Disp: , Rfl:    furosemide (LASIX) 20 MG tablet, TAKE 1 TABLET BY MOUTH EVERY DAY, Disp: 90 tablet, Rfl: 1   Garlic 10 MG CAPS, Take by mouth., Disp: , Rfl:    hydrALAZINE (APRESOLINE) 100 MG tablet, TAKE 1 TABLET (100 MG TOTAL) BY MOUTH 3 (THREE) TIMES DAILY., Disp: 90 tablet, Rfl: 0   JARDIANCE 25 MG TABS tablet, Take 25 mg by mouth every morning., Disp: , Rfl:    rosuvastatin (CRESTOR) 20 MG tablet, Take 1 tablet (20 mg total) by mouth daily., Disp: 90 tablet, Rfl: 3   sildenafil (REVATIO) 20 MG tablet, TAKE 1 TABLET BY MOUTH ONCE DAILY AS NEEDED, Disp: 30 tablet, Rfl: 0   tamsulosin (FLOMAX) 0.4 MG CAPS capsule, TAKE 1 CAPSLE BY MOUTH DAILY, Disp: 90 capsule, Rfl: 3  vitamin B-12 (CYANOCOBALAMIN) 500 MCG tablet, Take 500 mcg by mouth daily., Disp: , Rfl:    finasteride (PROSCAR) 5 MG tablet, TAKE 1 TABLET BY MOUTH EVERY DAY (Patient not taking: Reported on 12/07/2022), Disp: 90 tablet, Rfl: 1   fluticasone (FLONASE) 50 MCG/ACT nasal spray, Place 1 spray into both nostrils daily., Disp: 16 g, Rfl: 2   Allergies  Allergen Reactions   Ace Inhibitors Cough     Review of Systems  Constitutional: Negative.   HENT: Negative.    Eyes:  Negative for blurred vision.  Respiratory:  Negative for shortness of breath.   Cardiovascular: Negative.  Negative for chest pain and palpitations.  Endocrine: Negative.   Musculoskeletal: Negative.   Skin: Negative.   Allergic/Immunologic: Negative.    Neurological: Negative.   Hematological: Negative.      Today's Vitals   12/07/22 1058  BP: 130/84  Pulse: 70  Temp: 98.4 F (36.9 C)  SpO2: 98%  Weight: 261 lb 3.2 oz (118.5 kg)  Height:  (1.676 m)   Body mass index is 42.16 kg/m.  Wt Readings from Last 3 Encounters:  12/07/22 261 lb 3.2 oz (118.5 kg)  06/16/22 279 lb (126.6 kg)  03/19/22 263 lb 11.2 oz (119.6 kg)    Objective:  Physical Exam      Assessment And Plan:     1. Benign hypertensive renal disease  2. Pure hypercholesterolemia  3. Stage 3b chronic kidney disease  4. Need for shingles vaccine - Zoster Recombinant (Shingrix )  5. Class 3 severe obesity due to excess calories with serious comorbidity and body mass index (BMI) of 40.0 to 44.9 in adult She is encouraged to strive for BMI less than 30 to decrease cardiac risk. Advised to aim for at least 150 minutes of exercise per week.    Patient was given opportunity to ask questions. Patient verbalized understanding of the plan and was able to repeat key elements of the plan. All questions were answered to their satisfaction.  Gwynneth Aliment, MD   I, Gwynneth Aliment, MD, have reviewed all documentation for this visit. The documentation on 12/07/22 for the exam, diagnosis, procedures, and orders are all accurate and complete.   IF YOU HAVE BEEN REFERRED TO A SPECIALIST, IT MAY TAKE 1-2 WEEKS TO SCHEDULE/PROCESS THE REFERRAL. IF YOU HAVE NOT HEARD FROM US/SPECIALIST IN TWO WEEKS, PLEASE GIVE [place] A CALL AT [phone].   THE PATIENT IS ENCOURAGED TO PRACTICE SOCIAL DISTANCING DUE TO THE COVID-19 PANDEMIC.

## 2022-12-08 DIAGNOSIS — M1A30X0 Chronic gout due to renal impairment, unspecified site, without tophus (tophi): Secondary | ICD-10-CM | POA: Insufficient documentation

## 2022-12-09 ENCOUNTER — Other Ambulatory Visit: Payer: Self-pay | Admitting: Internal Medicine

## 2022-12-10 LAB — CBC
Hematocrit: 39 % (ref 37.5–51.0)
Hemoglobin: 12.2 g/dL — ABNORMAL LOW (ref 13.0–17.7)
MCH: 28.9 pg (ref 26.6–33.0)
MCHC: 31.3 g/dL — ABNORMAL LOW (ref 31.5–35.7)
MCV: 92 fL (ref 79–97)
Platelets: 127 10*3/uL — ABNORMAL LOW (ref 150–450)
RBC: 4.22 x10E6/uL (ref 4.14–5.80)
RDW: 11.7 % (ref 11.6–15.4)
WBC: 3.8 10*3/uL (ref 3.4–10.8)

## 2022-12-10 LAB — LIPID PANEL
Chol/HDL Ratio: 2.7 ratio (ref 0.0–5.0)
Cholesterol, Total: 133 mg/dL (ref 100–199)
HDL: 49 mg/dL (ref >39)
LDL Chol Calc (NIH): 64 mg/dL (ref 0–99)
Triglycerides: 109 mg/dL (ref 0–149)
VLDL Cholesterol Cal: 20 mg/dL (ref 5–40)

## 2022-12-10 LAB — PROTEIN ELECTROPHORESIS, SERUM
A/G Ratio: 0.9 (ref 0.7–1.7)
Albumin ELP: 3.3 g/dL (ref 2.9–4.4)
Alpha 1: 0.3 g/dL (ref 0.0–0.4)
Alpha 2: 0.7 g/dL (ref 0.4–1.0)
Beta: 1 g/dL (ref 0.7–1.3)
Gamma Globulin: 1.8 g/dL (ref 0.4–1.8)
Globulin, Total: 3.8 g/dL (ref 2.2–3.9)
Total Protein: 7.1 g/dL (ref 6.0–8.5)

## 2022-12-10 LAB — URIC ACID: Uric Acid: 8.4 mg/dL (ref 3.8–8.4)

## 2022-12-10 LAB — PTH, INTACT AND CALCIUM
Calcium: 8.9 mg/dL (ref 8.6–10.2)
PTH: 166 pg/mL — ABNORMAL HIGH (ref 15–65)

## 2022-12-10 LAB — PHOSPHORUS: Phosphorus: 3.6 mg/dL (ref 2.8–4.1)

## 2022-12-15 ENCOUNTER — Other Ambulatory Visit: Payer: Self-pay | Admitting: Internal Medicine

## 2022-12-16 ENCOUNTER — Other Ambulatory Visit: Payer: Self-pay

## 2022-12-23 ENCOUNTER — Other Ambulatory Visit (INDEPENDENT_AMBULATORY_CARE_PROVIDER_SITE_OTHER): Payer: Medicare PPO | Admitting: Internal Medicine

## 2022-12-23 DIAGNOSIS — D72818 Other decreased white blood cell count: Secondary | ICD-10-CM | POA: Diagnosis not present

## 2022-12-23 LAB — HEMOCCULT GUIAC POC 1CARD (OFFICE)
Card #2 Fecal Occult Blod, POC: NEGATIVE
Card #3 Fecal Occult Blood, POC: NEGATIVE
Fecal Occult Blood, POC: NEGATIVE

## 2023-01-02 ENCOUNTER — Other Ambulatory Visit: Payer: Self-pay | Admitting: Internal Medicine

## 2023-01-20 ENCOUNTER — Other Ambulatory Visit: Payer: Self-pay | Admitting: Pharmacist

## 2023-01-20 DIAGNOSIS — N184 Chronic kidney disease, stage 4 (severe): Secondary | ICD-10-CM | POA: Diagnosis not present

## 2023-01-20 NOTE — Progress Notes (Signed)
Pharmacy Quality Measure Review  This patient is appearing on the insurance-provided list for being at risk of failing the adherence measure for diabetes medications this calendar year.   Medication: Jardiance 25 mg Last fill date: 01/04/23 for 30 day supply - insurance list was not up to date.  No action needed at this time.   Catie Eppie Gibson, PharmD, BCACP, CPP [facility] [phone]

## 2023-01-26 ENCOUNTER — Ambulatory Visit (INDEPENDENT_AMBULATORY_CARE_PROVIDER_SITE_OTHER): Payer: Medicare PPO

## 2023-01-26 DIAGNOSIS — I77819 Aortic ectasia, unspecified site: Secondary | ICD-10-CM

## 2023-01-26 DIAGNOSIS — I340 Nonrheumatic mitral (valve) insufficiency: Secondary | ICD-10-CM | POA: Diagnosis not present

## 2023-01-26 LAB — ECHOCARDIOGRAM COMPLETE
Area-P 1/2: 3.77 cm2
S' Lateral: 2.23 cm

## 2023-01-27 ENCOUNTER — Telehealth (HOSPITAL_BASED_OUTPATIENT_CLINIC_OR_DEPARTMENT_OTHER): Payer: Self-pay

## 2023-01-27 NOTE — Telephone Encounter (Addendum)
Results called to patient who verbalizes understanding! Patient scheduled for 6/24 with Gillian Shields, Np. He has noticed some SHOB, will increase lasix as recommended.    ----- Message from Alver Sorrow, NP sent at 01/27/2023  1:31 PM EDT ----- Echocardiogram with normal heart pumping function.  Moderate thickness and stiffness of the heart muscle which is stable.  Right ventricle function mildly reduced.  Large pleural effusion (fluid around the left lung).  No significant valvular normalities.  Aorta measurements unremarkable.  Needs office visit for further evaluation.  Please ensure taking Lasix 20 mg daily.  If he is noting shortness of breath recommend increase Lasix to 40 mg daily for 3 days then return to 20 mg daily.

## 2023-01-28 DIAGNOSIS — N2581 Secondary hyperparathyroidism of renal origin: Secondary | ICD-10-CM | POA: Diagnosis not present

## 2023-01-28 DIAGNOSIS — R809 Proteinuria, unspecified: Secondary | ICD-10-CM | POA: Diagnosis not present

## 2023-01-28 DIAGNOSIS — I129 Hypertensive chronic kidney disease with stage 1 through stage 4 chronic kidney disease, or unspecified chronic kidney disease: Secondary | ICD-10-CM | POA: Diagnosis not present

## 2023-01-28 DIAGNOSIS — N184 Chronic kidney disease, stage 4 (severe): Secondary | ICD-10-CM | POA: Diagnosis not present

## 2023-02-05 NOTE — Progress Notes (Deleted)
Office Visit    Patient Name: Andrew Schmitt Date of Encounter: 02/05/2023  Primary Care Provider:  Dorothyann Peng, MD Primary Cardiologist:  None  Chief Complaint    72 year old male with past medical history of hypertension, hyperlipidemia, CKD stage III, gout and obesity.  He is here today regarding recent echo findings of pleural effusion.  Past Medical History    Past Medical History:  Diagnosis Date   Ascending aortic aneurysm (HCC) 03/19/2022   BPH (benign prostatic hyperplasia)    CKD (chronic kidney disease) stage 3, GFR 30-59 ml/min (HCC) 04/05/2019   CKD (chronic kidney disease), stage IV (HCC) 04/05/2019   Coronary artery calcification 03/19/2022   Erectile dysfunction    Gout    Gout    Heart murmur    Hyperlipidemia    Hypertension    Obesity    Past Surgical History:  Procedure Laterality Date   CARDIAC SURGERY     Allergies Allergies  Allergen Reactions   Ace Inhibitors Cough   Labs/Other Studies Reviewed    The following studies were reviewed today: *** Cardiac Studies & Procedures       ECHOCARDIOGRAM  ECHOCARDIOGRAM COMPLETE 01/26/2023  Narrative ECHOCARDIOGRAM REPORT    Patient Name:   AIDDEN MARKOVIC [facility] Date of Exam: 01/26/2023 Medical Rec #:  [id number]         Height:       66.0 in Accession #:    [id number]        Weight:       261.2 lb Date of Birth:  [birth year]          BSA:          2.240 m Patient Age:    72 years          BP:           140/80 mmHg Patient Gender: M                 HR:           73 bpm. Exam Location:  Outpatient  Procedure: 2D Echo, 3D Echo, Cardiac Doppler, Color Doppler and Strain Analysis  Indications:    Aortic Dilatation  History:        Patient has prior history of Echocardiogram examinations, most recent 10/21/2021. CAD, Signs/Symptoms:Dyspnea; Risk Factors:Dyslipidemia, Non-Smoker and Hypertension. Ascending aortic aneurysm.  Sonographer:    Jeryl Columbia RDCS Referring Phys: [id number] CAITLIN Tilman Neat   Sonographer Comments: Patient denies any shortness of breath today. Patient states occasionally he will have shortness of breath that his medication alleviates. IMPRESSIONS   1. Left ventricular ejection fraction, by estimation, is 60 to 65%. The left ventricle has normal function. The left ventricle has no regional wall motion abnormalities. There is moderate concentric left ventricular hypertrophy. Left ventricular diastolic parameters are consistent with Grade II diastolic dysfunction (pseudonormalization). Elevated left ventricular end-diastolic pressure. 2. Right ventricular systolic function is mildly reduced. The right ventricular size is moderately enlarged. Tricuspid regurgitation signal is inadequate for assessing PA pressure. 3. Left atrial size was mildly dilated. 4. Large pleural effusion in the left lateral region. 5. The mitral valve is normal in structure. Trivial mitral valve regurgitation. No evidence of mitral stenosis. 6. The aortic valve has an indeterminant number of cusps. Aortic valve regurgitation is not visualized. Aortic valve sclerosis/calcification is present, without any evidence of aortic stenosis. 7. The inferior vena cava is normal in size with <50% respiratory variability, suggesting right atrial pressure of 8  mmHg. 8. Ascending aorta measurements are within normal limits for age when indexed to body surface area.  FINDINGS Left Ventricle: Left ventricular ejection fraction, by estimation, is 60 to 65%. The left ventricle has normal function. The left ventricle has no regional wall motion abnormalities. 3D ejection fraction reviewed and evaluated as part of the interpretation. Alternate measurement of EF is felt to be most reflective of LV function. The left ventricular internal cavity size was normal in size. There is moderate concentric left ventricular hypertrophy. Left ventricular diastolic parameters are consistent with Grade II diastolic  dysfunction (pseudonormalization). Elevated left ventricular end-diastolic pressure.  Right Ventricle: The right ventricular size is moderately enlarged. No increase in right ventricular wall thickness. Right ventricular systolic function is mildly reduced. Tricuspid regurgitation signal is inadequate for assessing PA pressure.  Left Atrium: Left atrial size was mildly dilated.  Right Atrium: Right atrial size was normal in size.  Pericardium: There is no evidence of pericardial effusion.  Mitral Valve: The mitral valve is normal in structure. Trivial mitral valve regurgitation. No evidence of mitral valve stenosis.  Tricuspid Valve: The tricuspid valve is normal in structure. Tricuspid valve regurgitation is not demonstrated. No evidence of tricuspid stenosis.  Aortic Valve: The aortic valve has an indeterminant number of cusps. Aortic valve regurgitation is not visualized. Aortic valve sclerosis/calcification is present, without any evidence of aortic stenosis.  Pulmonic Valve: The pulmonic valve was normal in structure. Pulmonic valve regurgitation is mild to moderate. No evidence of pulmonic stenosis.  Aorta: The aortic root is normal in size and structure. Ascending aorta measurements are within normal limits for age when indexed to body surface area.  Venous: The inferior vena cava is normal in size with less than 50% respiratory variability, suggesting right atrial pressure of 8 mmHg.  IAS/Shunts: No atrial level shunt detected by color flow Doppler.  Additional Comments: There is a large pleural effusion in the left lateral region.   LEFT VENTRICLE PLAX 2D LVIDd:         3.74 cm   Diastology LVIDs:         2.23 cm   LV e' medial:    5.55 cm/s LV PW:         2.02 cm   LV E/e' medial:  24.9 LV IVS:        1.49 cm   LV e' lateral:   7.18 cm/s LVOT diam:     1.90 cm   LV E/e' lateral: 19.2 LV SV:         48 LV SV Index:   22 LVOT Area:     2.84 cm  3D Volume EF: 3D EF:         50 % LV EDV:       110 ml LV ESV:       55 ml LV SV:        56 ml  RIGHT VENTRICLE RV Basal diam:  4.97 cm RV Mid diam:    3.96 cm RV S prime:     8.81 cm/s TAPSE (M-mode): 1.6 cm  LEFT ATRIUM             Index        RIGHT ATRIUM           Index LA diam:        3.80 cm 1.70 cm/m   RA Area:     23.00 cm LA Vol (A2C):   94.2 ml 42.05 ml/m  RA Volume:  77.90 ml  34.78 ml/m LA Vol (A4C):   66.8 ml 29.82 ml/m LA Biplane Vol: 81.6 ml 36.43 ml/m AORTIC VALVE LVOT Vmax:   81.90 cm/s LVOT Vmean:  54.600 cm/s LVOT VTI:    0.170 m  AORTA Ao Root diam: 3.60 cm Ao Asc diam:  3.90 cm  MITRAL VALVE MV Area (PHT): 3.77 cm     SHUNTS MV Decel Time: 201 msec     Systemic VTI:  0.17 m MV E velocity: 138.00 cm/s  Systemic Diam: 1.90 cm MV A velocity: 109.00 cm/s MV E/A ratio:  1.27  Armanda Magic MD Electronically signed by Armanda Magic MD Signature Date/Time: 01/26/2023/1:30:51 PM    Final     CT SCANS  CT CARDIAC SCORING (SELF PAY ONLY) 01/13/2022  Addendum 01/13/2022 12:24 AM ADDENDUM REPORT: 01/13/2022 00:22  CLINICAL DATA:  Cardiovascular Disease Risk stratification  EXAM: Coronary Calcium Score  TECHNIQUE: A gated, non-contrast computed tomography scan of the heart was performed using 3mm slice thickness. Axial images were analyzed on a dedicated workstation. Calcium scoring of the coronary arteries was performed using the Agatston method.  FINDINGS: Coronary arteries: Normal origins.  Coronary Calcium Score:  Left main: 11  Left anterior descending artery: 58  Left circumflex artery: 39  Right coronary artery: 25  Total: 132  Percentile: 63  Pericardium: Normal.  Ascending Aorta: Dilated ascending aorta measuring 40mm  Non-cardiac: See separate report from [facility].  IMPRESSION: 1. Coronary calcium score of 132. This was 63rd percentile for age-, race-, and sex-matched controls.  2.  Dilated ascending aorta measuring  40mm  3.  Dilated main pulmonary artery measuring 32mm  RECOMMENDATIONS: Coronary artery calcium (CAC) score is a strong predictor of incident coronary heart disease (CHD) and provides predictive information beyond traditional risk factors. CAC scoring is reasonable to use in the decision to withhold, postpone, or initiate statin therapy in intermediate-risk or selected borderline-risk asymptomatic adults (age 67-75 years and LDL-C >=70 to <190 mg/dL) who do not have diabetes or established atherosclerotic cardiovascular disease (ASCVD).* In intermediate-risk (10-year ASCVD risk >=7.5% to <20%) adults or selected borderline-risk (10-year ASCVD risk >=5% to <7.5%) adults in whom a CAC score is measured for the purpose of making a treatment decision the following recommendations have been made:  If CAC=0, it is reasonable to withhold statin therapy and reassess in 5 to 10 years, as long as higher risk conditions are absent (diabetes mellitus, family history of premature CHD in first degree relatives (males <55 years; females <65 years), cigarette smoking, or LDL >=190 mg/dL).  If CAC is 1 to 99, it is reasonable to initiate statin therapy for patients >=25 years of age.  If CAC is >=100 or >=75th percentile, it is reasonable to initiate statin therapy at any age.  Cardiology referral should be considered for patients with CAC scores >=400 or >=75th percentile.  *2018 AHA/ACC/AACVPR/AAPA/ABC/ACPM/ADA/AGS/APhA/ASPC/NLA/PCNA Guideline on the Management of Blood Cholesterol: A Report of the American College of Cardiology/American Heart Association Task Force on Clinical Practice Guidelines. J Am Coll Cardiol. 2019;73(24):3168-3209.  Epifanio Lesches, MD   Electronically Signed By: Epifanio Lesches M.D. On: 01/13/2022 00:22  Narrative EXAM: OVER-READ INTERPRETATION  CT CHEST  The following report is a limited chest CT over-read performed by radiologist Dr. Caprice Renshaw of [facility] on 01/12/2022. This over-read does not include interpretation of cardiac or coronary anatomy or pathology. The coronary calcium interpretation by the cardiologist is attached.  COMPARISON:  None Available.  FINDINGS: Vascular:  No significant extracardiac vascular findings. Mild coronary artery calcifications.  Mediastinum/Nodes: No lymphadenopathy.  Lungs/Pleura: Left basilar linear atelectasis. No suspicious pulmonary nodules.  Upper Abdomen: No acute abnormality.  Musculoskeletal: No acute osseous abnormality. No suspicious lytic or blastic lesions.  IMPRESSION: No acute or significant incidental extracardiac findings in the chest. Interpretation of cardiac/coronary anatomy by cardiology to follow.  Electronically Signed: By: Caprice Renshaw M.D. On: 01/12/2022 12:29         Recent Labs: 04/21/2022: ALT 23; BUN 45; Creatinine, Ser 2.06; Potassium 4.0; Sodium 142 12/07/2022: Hemoglobin 12.2; Platelets 127  Recent Lipid Panel    Component Value Date/Time   CHOL 133 12/07/2022 1147   TRIG 109 12/07/2022 1147   HDL 49 12/07/2022 1147   CHOLHDL 2.7 12/07/2022 1147   LDLCALC 64 12/07/2022 1147    History of Present Illness    72 year old male with past medical history of hypertension, hyperlipidemia, CKD stage III, gout and obesity.  He was first seen on 11/30/2021 for dyspnea on exertion, deconditioning was thought to be contributory.  Echo performed 10/21/2021 showed LVEF of 60 to 65% and grade 2 diastolic dysfunction.  Initial visit his blood pressure was poorly controlled options noted to be limited due to bradycardia and renal dysfunction.  Renal Dopplers were normal 11/2021 and renin and aldosterone were normal.  He had a score of 132, which was 63rd percentile for his age.  His ascending aorta was mildly dilated at 4.0 cm PA was 3.2.   Echocardiogram on 01/26/23 indicated LVEF of 60 to 65%, no RWMA, moderate concerntric left ventricular  hypertrophy. GIIDD. RV systolic function mildly reduced, RV moderately enlarged. Large pleural effusion noted in the left lateral region. Aortic valve sclerosis/calcification present without an evidence of aortic stenosis. Ascending aorta measurements were within normal limits. He was encouraged to increase his lasix to 40mg  daliy for three days then to return to 20mg  daily.   - 40mg  lasix for three days?  - Bmet  - Shortness of breath - Last neph visit      Pleural effusion:  Noted to have large pleural effusion in left lateral region on echo on 01/26/23 Reported some shortness of breath encouraged to increase Lasix to 40mg  daily for three days.  Check Bmet  Hypertension: BP today BP at home  Continue  CKD stage IV  Followed by [facility]  Renal panel on 01/20/2023 indicated a creatinine of 2.42, EGFR of 27, BUN of 45 and potassium 4.2 Bmet  Hyperlipidemia Last lipid panel indicated total cholesterol of 133 and LDL of 64  Continue     Home Medications    Current Outpatient Medications  Medication Sig Dispense Refill   allopurinol (ZYLOPRIM) 300 MG tablet TAKE 1 TABLET BY MOUTH EVERY DAY 30 tablet 1   amLODipine (NORVASC) 10 MG tablet Take 1 tablet (10 mg total) by mouth daily. PLEASE CALL OFFICE TO SCHEDULE APPT. 30 tablet 1   aspirin EC 81 MG tablet Take 81 mg by mouth daily.     carvedilol (COREG) 6.25 MG tablet TAKE 1 TABLET BY MOUTH TWICE A DAY WITH FOOD 180 tablet 0   chlorthalidone (HYGROTON) 50 MG tablet Take 50 mg by mouth daily.     cholecalciferol (VITAMIN D) 1000 units tablet Take 1,000 Units by mouth daily.     docusate sodium (COLACE) 100 MG capsule Take 100 mg by mouth 2 (two) times daily.     Emollient (AVEENO POSITIVELY AGELESS EX) Apply 1 tablet topically daily.  finasteride (PROSCAR) 5 MG tablet TAKE 1 TABLET BY MOUTH EVERY DAY 90 tablet 1   fluticasone (FLONASE) 50 MCG/ACT nasal spray Place 1 spray into both nostrils daily. 16 g 2   furosemide  (LASIX) 20 MG tablet TAKE 1 TABLET BY MOUTH EVERY DAY 90 tablet 1   Garlic 10 MG CAPS Take by mouth.     hydrALAZINE (APRESOLINE) 100 MG tablet TAKE 1 TABLET (100 MG TOTAL) BY MOUTH 3 (THREE) TIMES DAILY. 90 tablet 0   JARDIANCE 25 MG TABS tablet Take 25 mg by mouth every morning.     rosuvastatin (CRESTOR) 10 MG tablet TAKE 1 TABLET BY MOUTH EVERY DAY 90 tablet 1   rosuvastatin (CRESTOR) 20 MG tablet Take 1 tablet (20 mg total) by mouth daily. 90 tablet 3   sildenafil (REVATIO) 20 MG tablet TAKE 1 TABLET BY MOUTH ONCE DAILY AS NEEDED 30 tablet 0   tamsulosin (FLOMAX) 0.4 MG CAPS capsule TAKE 1 CAPSLE BY MOUTH DAILY 90 capsule 3   vitamin B-12 (CYANOCOBALAMIN) 500 MCG tablet Take 500 mcg by mouth daily.     No current facility-administered medications for this visit.     Review of Systems    ***.  All other systems reviewed and are otherwise negative except as noted above.    Physical Exam    VS:  There were no vitals taken for this visit. , BMI There is no height or weight on file to calculate BMI.     GEN: Well nourished, well developed, in no acute distress. HEENT: normal. Neck: Supple, no JVD, carotid bruits, or masses. Cardiac: RRR, no murmurs, rubs, or gallops. No clubbing, cyanosis, edema.  Radials/DP/PT 2+ and equal bilaterally.  Respiratory:  Respirations regular and unlabored, clear to auscultation bilaterally. GI: Soft, nontender, nondistended, BS + x 4. MS: no deformity or atrophy. Skin: warm and dry, no rash. Neuro:  Strength and sensation are intact. Psych: Normal affect.  Accessory Clinical Findings    ECG personally reviewed by me today -    - no acute changes.   Lab Results  Component Value Date   WBC 3.8 12/07/2022   HGB 12.2 (L) 12/07/2022   HCT 39.0 12/07/2022   MCV 92 12/07/2022   PLT 127 (L) 12/07/2022   Lab Results  Component Value Date   CREATININE 2.06 (H) 04/21/2022   BUN 45 (H) 04/21/2022   NA 142 04/21/2022   K 4.0 04/21/2022   CL 105  04/21/2022   CO2 21 04/21/2022   Lab Results  Component Value Date   ALT 23 04/21/2022   AST 22 04/21/2022   ALKPHOS 75 04/21/2022   BILITOT 0.5 04/21/2022   Lab Results  Component Value Date   CHOL 133 12/07/2022   HDL 49 12/07/2022   LDLCALC 64 12/07/2022   TRIG 109 12/07/2022   CHOLHDL 2.7 12/07/2022    Lab Results  Component Value Date   HGBA1C 5.4 04/09/2021    Assessment & Plan    1.  ***  No BP recorded.  {Refresh Note OR Click here to enter BP  :1}***   Rip Harbour, NP 02/05/2023, 9:44 PM

## 2023-02-07 ENCOUNTER — Ambulatory Visit (HOSPITAL_BASED_OUTPATIENT_CLINIC_OR_DEPARTMENT_OTHER): Payer: Medicare PPO | Admitting: Cardiology

## 2023-02-07 DIAGNOSIS — R0609 Other forms of dyspnea: Secondary | ICD-10-CM

## 2023-02-07 DIAGNOSIS — J90 Pleural effusion, not elsewhere classified: Secondary | ICD-10-CM

## 2023-02-15 ENCOUNTER — Other Ambulatory Visit: Payer: Self-pay | Admitting: Pharmacist

## 2023-02-15 NOTE — Progress Notes (Unsigned)
Pharmacy Quality Measure Review  This patient is appearing on a report for being at risk of failing the adherence measure for diabetes medications this calendar year.   Medication: Jardiance 25 mg  Last fill date: 01/04/23 for 30 day supply  Attempted to contact patient to discuss, left voicemail for him to return my call at his convenience. Per Media tab, patient saw nephrology Dr Marisue Humble 01/28/23, refills given for Jardiance. BP was at goal that day, reading documented.   Catie Eppie Gibson, PharmD, BCACP, CPP Clinical Pharmacist [facility] [phone]

## 2023-02-24 ENCOUNTER — Other Ambulatory Visit: Payer: Self-pay | Admitting: Internal Medicine

## 2023-03-22 DIAGNOSIS — I2510 Atherosclerotic heart disease of native coronary artery without angina pectoris: Secondary | ICD-10-CM | POA: Diagnosis not present

## 2023-03-22 DIAGNOSIS — N1832 Chronic kidney disease, stage 3b: Secondary | ICD-10-CM | POA: Diagnosis not present

## 2023-03-22 DIAGNOSIS — K5900 Constipation, unspecified: Secondary | ICD-10-CM | POA: Diagnosis not present

## 2023-03-22 DIAGNOSIS — E785 Hyperlipidemia, unspecified: Secondary | ICD-10-CM | POA: Diagnosis not present

## 2023-03-22 DIAGNOSIS — N400 Enlarged prostate without lower urinary tract symptoms: Secondary | ICD-10-CM | POA: Diagnosis not present

## 2023-03-22 DIAGNOSIS — M1990 Unspecified osteoarthritis, unspecified site: Secondary | ICD-10-CM | POA: Diagnosis not present

## 2023-03-22 DIAGNOSIS — I509 Heart failure, unspecified: Secondary | ICD-10-CM | POA: Diagnosis not present

## 2023-03-22 DIAGNOSIS — K219 Gastro-esophageal reflux disease without esophagitis: Secondary | ICD-10-CM | POA: Diagnosis not present

## 2023-04-02 IMAGING — CT CT CARDIAC CORONARY ARTERY CALCIUM SCORE
3 series · 14 of 20 positions shown, 16 images · non-contrast
Comparison: None Available.
COMPARISON: None Available.

Addendum:
EXAM:
OVER-READ INTERPRETATION  CT CHEST

The following report is a limited chest CT over-read performed by
This over-read does not include interpretation of cardiac or
coronary anatomy or pathology. The coronary calcium interpretation
by the cardiologist is attached.
CLINICAL DATA: Cardiovascular Disease Risk stratification
Coronary Calcium Score
TECHNIQUE: A gated, non-contrast computed tomography scan of the heart was
performed using 3mm slice thickness. Axial images were analyzed on a
dedicated workstation. Calcium scoring of the coronary arteries was
performed using the Agatston method.

[Series 2: cascseq 2.0 sa36 70% (id) · axial · 0.44mm/px · z∈[-268,-178]mm · 4 of 76 slices shown]
[im 16/76  vessel]
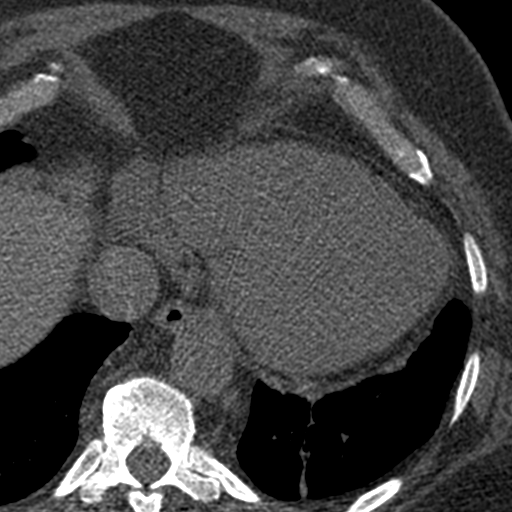
[im 31/76  vessel]
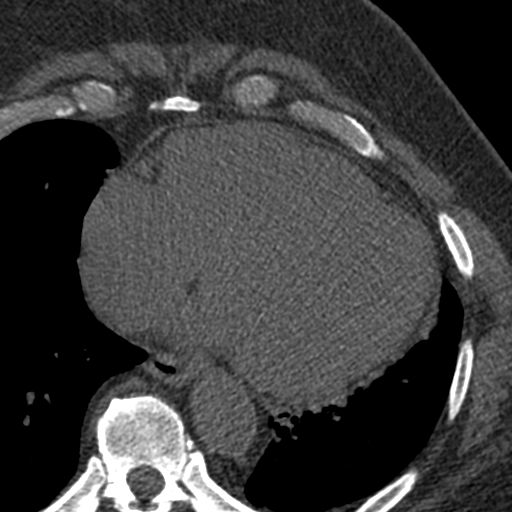
[im 46/76  vessel]
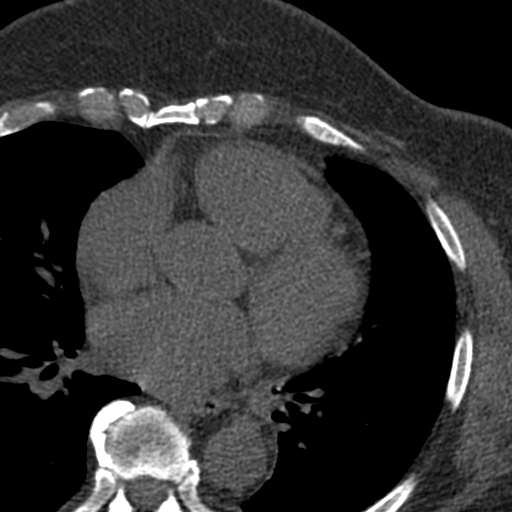
[im 61/76  vessel]
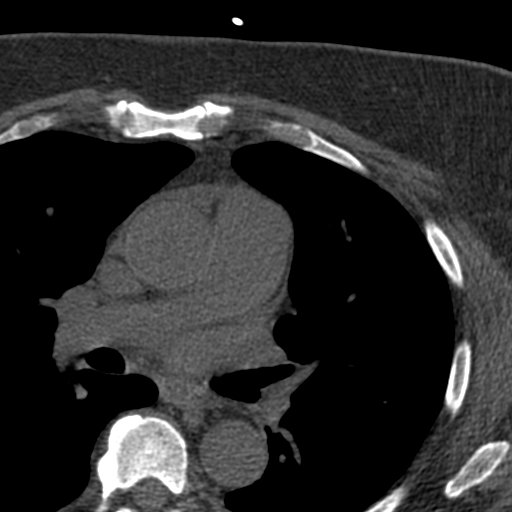

[Series 3: cascseq 2.0 bf37 st · axial · 0.73mm/px · z∈[-274,-174]mm · 5 of 76 slices shown, 7 images]
[im 13/76  vessel]
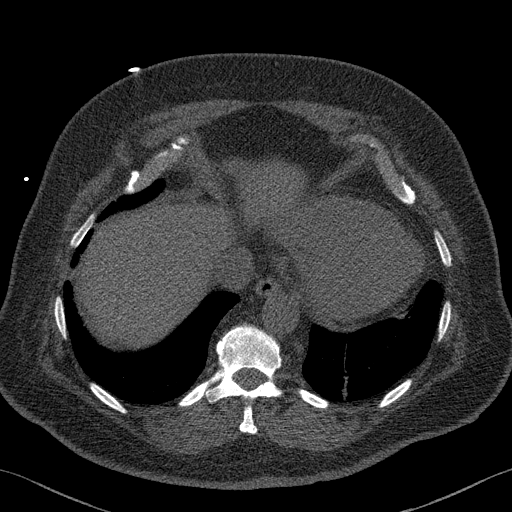
[im 13/76  lung]
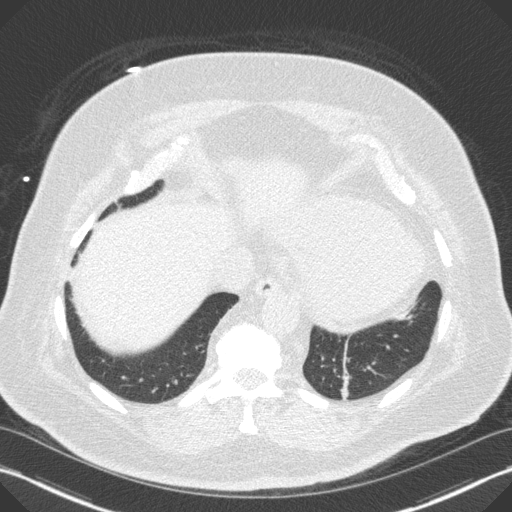
[im 26/76  vessel]
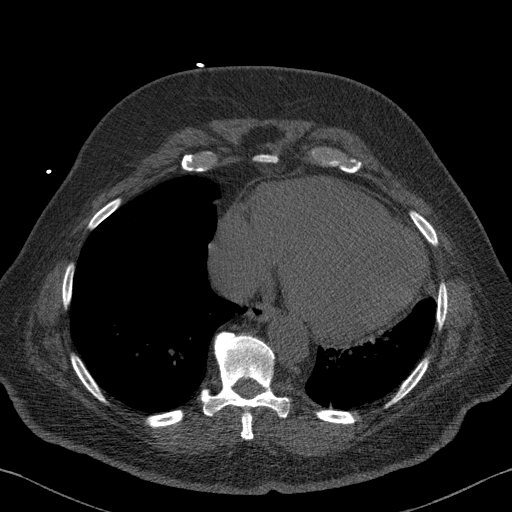
[im 38/76  vessel]
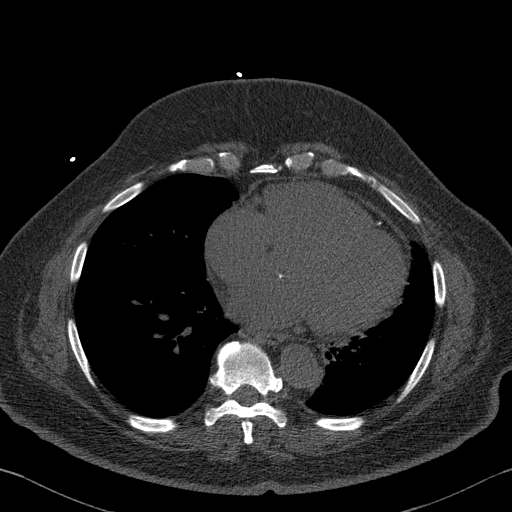
[im 51/76  vessel]
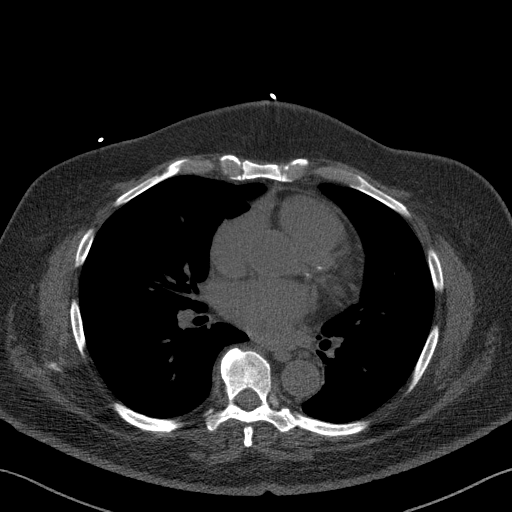
[im 63/76  vessel]
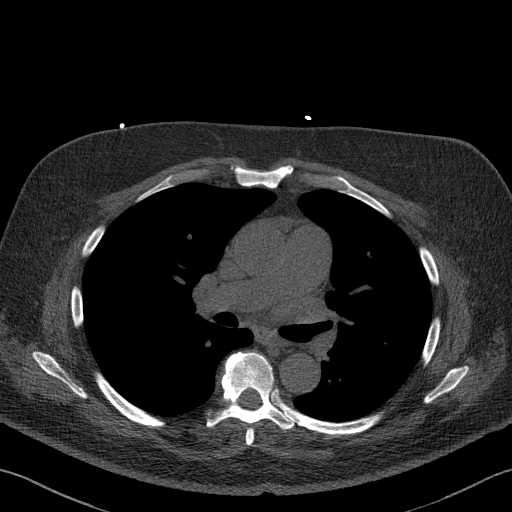
[im 63/76  lung]
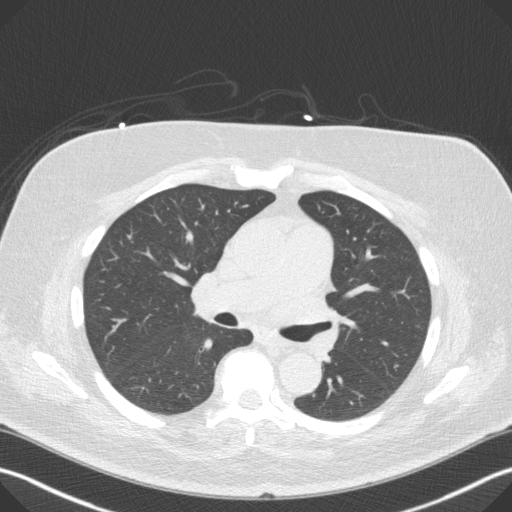

[Series 4: cascseq 2.0 br59 lung · axial · 0.73mm/px · z∈[-274,-174]mm · 5 of 76 slices shown]
[im 13/76  lung]
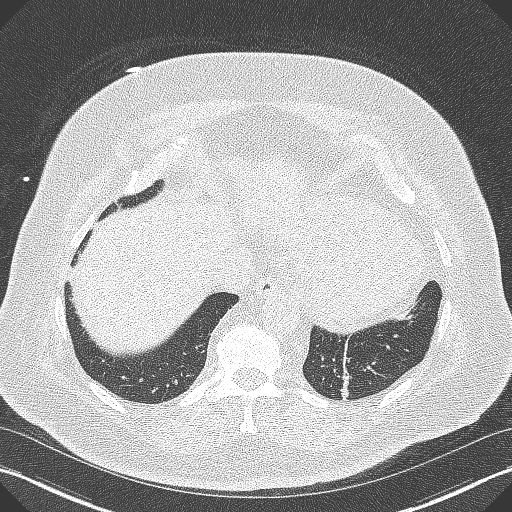
[im 26/76  lung]
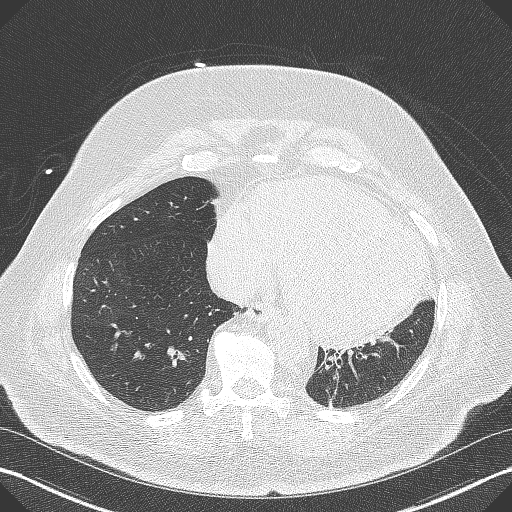
[im 38/76  lung]
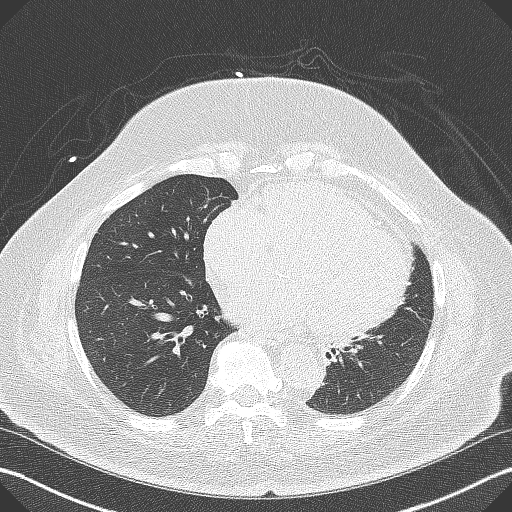
[im 51/76  lung]
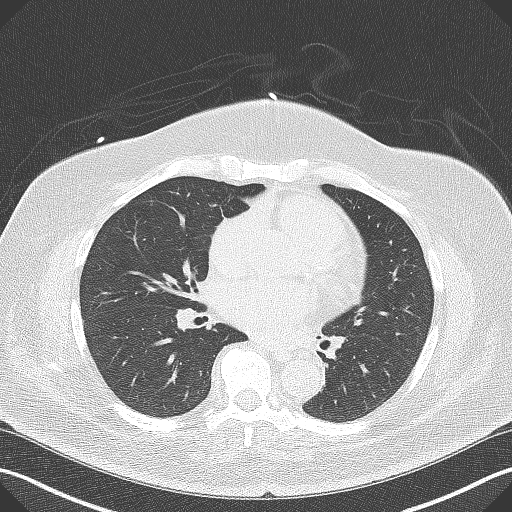
[im 63/76  lung]
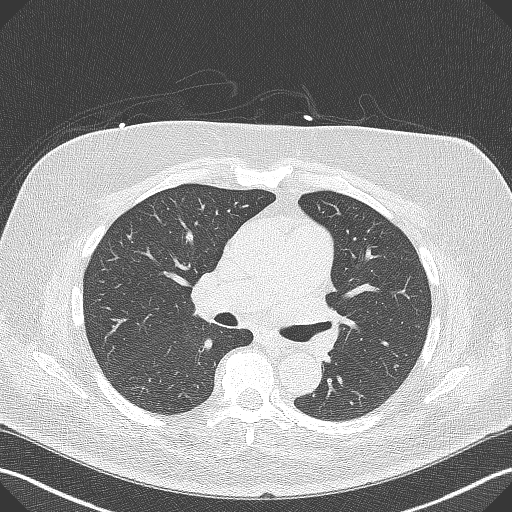

[14 of 20 positions shown; findings below may reference images not displayed]

FINDINGS: Vascular: No significant extracardiac vascular findings. Mild
coronary artery calcifications.

Mediastinum/Nodes: No lymphadenopathy.

Lungs/Pleura: Left basilar linear atelectasis. No suspicious
pulmonary nodules.

Upper Abdomen: No acute abnormality.

Musculoskeletal: No acute osseous abnormality. No suspicious lytic
or blastic lesions.
IMPRESSION: No acute or significant incidental extracardiac findings in the
chest. Interpretation of cardiac/coronary anatomy by cardiology to
follow.
FINDINGS: Coronary arteries: Normal origins.

Coronary Calcium Score:

Left main: 11

Left anterior descending artery: 58

Left circumflex artery: 39

Right coronary artery: 25

Total: 132

Percentile: 63

Pericardium: Normal.

Ascending Aorta: Dilated ascending aorta measuring 40mm

Non-cardiac: See separate report from [REDACTED].
IMPRESSION: 1. Coronary calcium score of 132. This was 63rd percentile for age-,
race-, and sex-matched controls.

2.  Dilated ascending aorta measuring 40mm

3.  Dilated main pulmonary artery measuring 32mm



If CAC=0, it is reasonable to withhold statin therapy and reassess
in 5 to 10 years, as long as higher risk conditions are absent
(diabetes mellitus, family history of premature CHD in first degree
relatives (males <55 years; females <65 years), cigarette smoking,
or LDL >=190 mg/dL).

If CAC is 1 to 99, it is reasonable to initiate statin therapy for
patients >=55 years of age.

If CAC is >=100 or >=75th percentile, it is reasonable to initiate
statin therapy at any age.

Cardiology referral should be considered for patients with CAC
scores >=400 or >=75th percentile.

*7859 AHA/ACC/AACVPR/AAPA/ABC/PICOU/HAIDARA/ZAINA/[place]/DEJAN/GEORGINA/MAU
Guideline on the Management of Blood Cholesterol: A Report of the
American College of Cardiology/American Heart Association Task Force
on Clinical Practice Guidelines. J Am Coll Cardiol.
1696;73(24):1371-1271.

*** End of Addendum ***
EXAM:
OVER-READ INTERPRETATION  CT CHEST

The following report is a limited chest CT over-read performed by
This over-read does not include interpretation of cardiac or
coronary anatomy or pathology. The coronary calcium interpretation
by the cardiologist is attached.
FINDINGS: Vascular: No significant extracardiac vascular findings. Mild
coronary artery calcifications.

Mediastinum/Nodes: No lymphadenopathy.

Lungs/Pleura: Left basilar linear atelectasis. No suspicious
pulmonary nodules.

Upper Abdomen: No acute abnormality.

Musculoskeletal: No acute osseous abnormality. No suspicious lytic
or blastic lesions.
IMPRESSION: No acute or significant incidental extracardiac findings in the
chest. Interpretation of cardiac/coronary anatomy by cardiology to
follow.

## 2023-04-22 ENCOUNTER — Other Ambulatory Visit: Payer: Self-pay | Admitting: Internal Medicine

## 2023-04-25 ENCOUNTER — Encounter: Payer: Medicare PPO | Admitting: Internal Medicine

## 2023-04-25 DIAGNOSIS — N1832 Chronic kidney disease, stage 3b: Secondary | ICD-10-CM

## 2023-04-25 DIAGNOSIS — E7800 Pure hypercholesterolemia, unspecified: Secondary | ICD-10-CM

## 2023-04-25 DIAGNOSIS — M1A30X0 Chronic gout due to renal impairment, unspecified site, without tophus (tophi): Secondary | ICD-10-CM

## 2023-04-25 DIAGNOSIS — Z0000 Encounter for general adult medical examination without abnormal findings: Secondary | ICD-10-CM

## 2023-04-25 DIAGNOSIS — I129 Hypertensive chronic kidney disease with stage 1 through stage 4 chronic kidney disease, or unspecified chronic kidney disease: Secondary | ICD-10-CM

## 2023-04-25 DIAGNOSIS — I2510 Atherosclerotic heart disease of native coronary artery without angina pectoris: Secondary | ICD-10-CM

## 2023-04-25 DIAGNOSIS — I7121 Aneurysm of the ascending aorta, without rupture: Secondary | ICD-10-CM

## 2023-04-25 DIAGNOSIS — N2581 Secondary hyperparathyroidism of renal origin: Secondary | ICD-10-CM

## 2023-04-25 NOTE — Progress Notes (Deleted)
I,Hezikiah Retzloff T Deloria Lair, CMA,acting as a Neurosurgeon for Gwynneth Aliment, MD.,have documented all relevant documentation on the behalf of Gwynneth Aliment, MD,as directed by  Gwynneth Aliment, MD while in the presence of Gwynneth Aliment, MD.  Subjective:   Patient ID: Andrew Schmitt , male    DOB: 1951/04/18 , 72 y.o.   MRN: [id number]  No chief complaint on file.   HPI  Patient presents today for annual exam. He reports compliance with medications. Denies headache, chest pain, SOB.      Past Medical History:  Diagnosis Date   Ascending aortic aneurysm (HCC) 03/19/2022   BPH (benign prostatic hyperplasia)    CKD (chronic kidney disease) stage 3, GFR 30-59 ml/min (HCC) 04/05/2019   CKD (chronic kidney disease), stage IV (HCC) 04/05/2019   Coronary artery calcification 03/19/2022   Erectile dysfunction    Gout    Gout    Heart murmur    Hyperlipidemia    Hypertension    Obesity      Family History  Problem Relation Age of Onset   Healthy Mother    Healthy Father      Current Outpatient Medications:    allopurinol (ZYLOPRIM) 300 MG tablet, TAKE 1 TABLET BY MOUTH EVERY DAY, Disp: 30 tablet, Rfl: 1   amLODipine (NORVASC) 10 MG tablet, TAKE 1 TABLET (10 MG TOTAL) BY MOUTH DAILY. PLEASE CALL OFFICE TO SCHEDULE APPT., Disp: 30 tablet, Rfl: 1   aspirin EC 81 MG tablet, Take 81 mg by mouth daily., Disp: , Rfl:    carvedilol (COREG) 6.25 MG tablet, TAKE 1 TABLET BY MOUTH TWICE A DAY WITH FOOD, Disp: 180 tablet, Rfl: 0   chlorthalidone (HYGROTON) 50 MG tablet, Take 50 mg by mouth daily., Disp: , Rfl:    cholecalciferol (VITAMIN D) 1000 units tablet, Take 1,000 Units by mouth daily., Disp: , Rfl:    docusate sodium (COLACE) 100 MG capsule, Take 100 mg by mouth 2 (two) times daily., Disp: , Rfl:    Emollient (AVEENO POSITIVELY AGELESS EX), Apply 1 tablet topically daily., Disp: , Rfl:    finasteride (PROSCAR) 5 MG tablet, TAKE 1 TABLET BY MOUTH EVERY DAY, Disp: 90 tablet, Rfl: 1   fluticasone  (FLONASE) 50 MCG/ACT nasal spray, Place 1 spray into both nostrils daily., Disp: 16 g, Rfl: 2   furosemide (LASIX) 20 MG tablet, TAKE 1 TABLET BY MOUTH EVERY DAY, Disp: 90 tablet, Rfl: 1   Garlic 10 MG CAPS, Take by mouth., Disp: , Rfl:    hydrALAZINE (APRESOLINE) 100 MG tablet, TAKE 1 TABLET (100 MG TOTAL) BY MOUTH 3 (THREE) TIMES DAILY., Disp: 90 tablet, Rfl: 0   JARDIANCE 25 MG TABS tablet, Take 25 mg by mouth every morning., Disp: , Rfl:    rosuvastatin (CRESTOR) 10 MG tablet, TAKE 1 TABLET BY MOUTH EVERY DAY, Disp: 90 tablet, Rfl: 1   rosuvastatin (CRESTOR) 20 MG tablet, Take 1 tablet (20 mg total) by mouth daily., Disp: 90 tablet, Rfl: 3   sildenafil (REVATIO) 20 MG tablet, TAKE 1 TABLET BY MOUTH ONCE DAILY AS NEEDED, Disp: 30 tablet, Rfl: 0   tamsulosin (FLOMAX) 0.4 MG CAPS capsule, TAKE 1 CAPSLE BY MOUTH DAILY, Disp: 90 capsule, Rfl: 3   vitamin B-12 (CYANOCOBALAMIN) 500 MCG tablet, Take 500 mcg by mouth daily., Disp: , Rfl:    Allergies  Allergen Reactions   Ace Inhibitors Cough     Men's preventive visit. 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 Patient is on a *** diet. Marital status: Married. Relevant history for alcohol use is:  Social History   Substance and Sexual Activity  Alcohol Use No  . Relevant history for tobacco use is:  Social History   Tobacco Use  Smoking Status Never  Smokeless Tobacco Never  .   Review of Systems  Constitutional: Negative.   HENT: Negative.    Respiratory: Negative.    Gastrointestinal: Negative.   Skin: Negative.   Allergic/Immunologic: Negative.   Neurological: Negative.   Hematological: Negative.      There were no vitals filed for this visit. There is no height or weight on file to calculate BMI.  Wt Readings from Last 3 Encounters:  12/07/22 261 lb 3.2 oz (118.5 kg)  06/16/22 279 lb (126.6 kg)   03/19/22 263 lb 11.2 oz (119.6 kg)    Objective:  Physical Exam      Assessment And Plan:    Annual physical exam  Benign hypertensive renal disease  Stage 3b chronic kidney disease (HCC)  Pure hypercholesterolemia  Coronary artery calcification  Secondary renal hyperparathyroidism (HCC)  Chronic gout due to renal impairment without tophus, unspecified site  Aneurysm of ascending aorta without rupture (HCC)     No follow-ups on file. Patient was given opportunity to ask questions. Patient verbalized understanding of the plan and was able to repeat key elements of the plan. All questions were answered to their satisfaction.   Gwynneth Aliment, MD  I, Gwynneth Aliment, MD, have reviewed all documentation for this visit. The documentation on 04/25/23 for the exam, diagnosis, procedures, and orders are all accurate and complete.

## 2023-04-27 DIAGNOSIS — N5201 Erectile dysfunction due to arterial insufficiency: Secondary | ICD-10-CM | POA: Diagnosis not present

## 2023-04-27 DIAGNOSIS — N400 Enlarged prostate without lower urinary tract symptoms: Secondary | ICD-10-CM | POA: Diagnosis not present

## 2023-05-06 ENCOUNTER — Ambulatory Visit: Payer: Medicare PPO

## 2023-05-06 VITALS — BP 128/80 | HR 68 | Temp 98.5°F | Ht 66.0 in | Wt 261.0 lb

## 2023-05-06 DIAGNOSIS — Z23 Encounter for immunization: Secondary | ICD-10-CM | POA: Diagnosis not present

## 2023-05-06 NOTE — Progress Notes (Signed)
Patient presents today for covid shot. Patient did wait allotted time for covid vaccine.

## 2023-05-18 DIAGNOSIS — N184 Chronic kidney disease, stage 4 (severe): Secondary | ICD-10-CM | POA: Diagnosis not present

## 2023-05-24 DIAGNOSIS — N2581 Secondary hyperparathyroidism of renal origin: Secondary | ICD-10-CM | POA: Diagnosis not present

## 2023-05-24 DIAGNOSIS — I129 Hypertensive chronic kidney disease with stage 1 through stage 4 chronic kidney disease, or unspecified chronic kidney disease: Secondary | ICD-10-CM | POA: Diagnosis not present

## 2023-05-24 DIAGNOSIS — N184 Chronic kidney disease, stage 4 (severe): Secondary | ICD-10-CM | POA: Diagnosis not present

## 2023-05-24 DIAGNOSIS — R809 Proteinuria, unspecified: Secondary | ICD-10-CM | POA: Diagnosis not present

## 2023-06-01 ENCOUNTER — Other Ambulatory Visit: Payer: Self-pay | Admitting: Internal Medicine

## 2023-06-21 ENCOUNTER — Other Ambulatory Visit: Payer: Self-pay | Admitting: Internal Medicine

## 2023-07-01 ENCOUNTER — Other Ambulatory Visit: Payer: Self-pay | Admitting: Internal Medicine

## 2023-07-05 ENCOUNTER — Other Ambulatory Visit: Payer: Self-pay | Admitting: Internal Medicine

## 2023-07-07 ENCOUNTER — Ambulatory Visit: Payer: Medicare PPO

## 2023-07-07 ENCOUNTER — Encounter: Payer: Self-pay | Admitting: Internal Medicine

## 2023-07-07 ENCOUNTER — Ambulatory Visit: Payer: Medicare PPO | Admitting: Internal Medicine

## 2023-07-07 VITALS — BP 140/80 | HR 81 | Temp 98.0°F | Ht 66.0 in | Wt 249.0 lb

## 2023-07-07 VITALS — BP 140/80 | HR 81 | Temp 98.0°F | Ht 66.2 in | Wt 249.0 lb

## 2023-07-07 DIAGNOSIS — Z6841 Body Mass Index (BMI) 40.0 and over, adult: Secondary | ICD-10-CM

## 2023-07-07 DIAGNOSIS — E66813 Obesity, class 3: Secondary | ICD-10-CM

## 2023-07-07 DIAGNOSIS — R0602 Shortness of breath: Secondary | ICD-10-CM

## 2023-07-07 DIAGNOSIS — Z23 Encounter for immunization: Secondary | ICD-10-CM | POA: Diagnosis not present

## 2023-07-07 DIAGNOSIS — E7800 Pure hypercholesterolemia, unspecified: Secondary | ICD-10-CM | POA: Diagnosis not present

## 2023-07-07 DIAGNOSIS — I130 Hypertensive heart and chronic kidney disease with heart failure and stage 1 through stage 4 chronic kidney disease, or unspecified chronic kidney disease: Secondary | ICD-10-CM

## 2023-07-07 DIAGNOSIS — I129 Hypertensive chronic kidney disease with stage 1 through stage 4 chronic kidney disease, or unspecified chronic kidney disease: Secondary | ICD-10-CM

## 2023-07-07 DIAGNOSIS — N1832 Chronic kidney disease, stage 3b: Secondary | ICD-10-CM

## 2023-07-07 DIAGNOSIS — I5032 Chronic diastolic (congestive) heart failure: Secondary | ICD-10-CM | POA: Diagnosis not present

## 2023-07-07 DIAGNOSIS — Z91148 Patient's other noncompliance with medication regimen for other reason: Secondary | ICD-10-CM

## 2023-07-07 DIAGNOSIS — I2510 Atherosclerotic heart disease of native coronary artery without angina pectoris: Secondary | ICD-10-CM

## 2023-07-07 DIAGNOSIS — Z0000 Encounter for general adult medical examination without abnormal findings: Secondary | ICD-10-CM | POA: Diagnosis not present

## 2023-07-07 NOTE — Progress Notes (Signed)
I,Victoria T Deloria Lair, CMA,acting as a Neurosurgeon for Gwynneth Aliment, MD.,have documented all relevant documentation on the behalf of Gwynneth Aliment, MD,as directed by  Gwynneth Aliment, MD while in the presence of Gwynneth Aliment, MD.  Subjective:  Patient ID: Andrew Schmitt , male    DOB: Jun 22, 1951 , 72 y.o.   MRN: [id number]  Chief Complaint  Patient presents with   Hypertension   Hyperlipidemia    HPI  Patient presents today for bp & cholesterol check. He reports compliance with meds. He denies headaches, chest pain and palpitations. He has no specific concerns or complaints at this time.   AWV completed with [facility] Advisor: [place].      Hypertension This is a chronic problem. The current episode started more than 1 year ago. The problem has been gradually improving since onset. Associated symptoms include shortness of breath. Pertinent negatives include no blurred vision, chest pain or palpitations. Risk factors for coronary artery disease include obesity, sedentary lifestyle, male gender and dyslipidemia. Past treatments include direct vasodilators. Identifiable causes of hypertension include chronic renal disease.  Hyperlipidemia This is a chronic problem. The current episode started more than 1 year ago. The problem is controlled. Exacerbating diseases include chronic renal disease and obesity. Associated symptoms include shortness of breath. Pertinent negatives include no chest pain. The current treatment provides moderate improvement of lipids.     Past Medical History:  Diagnosis Date   Ascending aortic aneurysm (HCC) 03/19/2022   BPH (benign prostatic hyperplasia)    CKD (chronic kidney disease) stage 3, GFR 30-59 ml/min (HCC) 04/05/2019   CKD (chronic kidney disease), stage IV (HCC) 04/05/2019   Coronary artery calcification 03/19/2022   Erectile dysfunction    Gout    Gout    Heart murmur    Hyperlipidemia    Hypertension    Obesity      Family History  Problem Relation  Age of Onset   Healthy Mother    Healthy Father      Current Outpatient Medications:    allopurinol (ZYLOPRIM) 300 MG tablet, TAKE 1 TABLET BY MOUTH EVERY DAY, Disp: 30 tablet, Rfl: 1   amLODipine (NORVASC) 10 MG tablet, TAKE 1 TABLET (10 MG TOTAL) BY MOUTH DAILY. PLEASE CALL OFFICE TO SCHEDULE APPT., Disp: 90 tablet, Rfl: 1   aspirin EC 81 MG tablet, Take 81 mg by mouth daily., Disp: , Rfl:    carvedilol (COREG) 6.25 MG tablet, TAKE 1 TABLET BY MOUTH TWICE A DAY WITH FOOD, Disp: 180 tablet, Rfl: 0   chlorthalidone (HYGROTON) 50 MG tablet, Take 50 mg by mouth daily., Disp: , Rfl:    cholecalciferol (VITAMIN D) 1000 units tablet, Take 1,000 Units by mouth daily., Disp: , Rfl:    docusate sodium (COLACE) 100 MG capsule, Take 100 mg by mouth 2 (two) times daily., Disp: , Rfl:    Emollient (AVEENO POSITIVELY AGELESS EX), Apply 1 tablet topically daily., Disp: , Rfl:    finasteride (PROSCAR) 5 MG tablet, TAKE 1 TABLET BY MOUTH EVERY DAY, Disp: 90 tablet, Rfl: 1   fluticasone (FLONASE) 50 MCG/ACT nasal spray, Place 1 spray into both nostrils daily., Disp: 16 g, Rfl: 2   furosemide (LASIX) 20 MG tablet, TAKE 1 TABLET BY MOUTH EVERY DAY, Disp: 90 tablet, Rfl: 1   Garlic 10 MG CAPS, Take by mouth., Disp: , Rfl:    hydrALAZINE (APRESOLINE) 100 MG tablet, Take 1 tablet (100 mg total) by mouth 3 (three) times daily., Disp: 270  tablet, Rfl: 2   JARDIANCE 25 MG TABS tablet, Take 25 mg by mouth every morning., Disp: , Rfl:    rosuvastatin (CRESTOR) 10 MG tablet, TAKE 1 TABLET BY MOUTH EVERY DAY (Patient not taking: Reported on 07/07/2023), Disp: 90 tablet, Rfl: 1   rosuvastatin (CRESTOR) 20 MG tablet, Take 1 tablet (20 mg total) by mouth daily., Disp: 90 tablet, Rfl: 3   sildenafil (REVATIO) 20 MG tablet, TAKE 1 TABLET BY MOUTH ONCE DAILY AS NEEDED, Disp: 30 tablet, Rfl: 0   tamsulosin (FLOMAX) 0.4 MG CAPS capsule, TAKE 1 CAPSLE BY MOUTH DAILY, Disp: 90 capsule, Rfl: 3   vitamin B-12 (CYANOCOBALAMIN) 500  MCG tablet, Take 500 mcg by mouth daily., Disp: , Rfl:    Allergies  Allergen Reactions   Ace Inhibitors Cough     Review of Systems  Constitutional: Negative.   HENT: Negative.    Eyes:  Negative for blurred vision.  Respiratory:  Positive for shortness of breath.   Cardiovascular: Negative.  Negative for chest pain and palpitations.  Gastrointestinal: Negative.   Genitourinary: Negative.   Skin: Negative.   Allergic/Immunologic: Negative.   Neurological: Negative.   Hematological: Negative.      Today's Vitals   07/07/23 0839 07/07/23 0905  BP: (!) 150/90 (!) 140/80  Pulse: 81   Temp: 98 F (36.7 C)   SpO2: 98%   Weight: 249 lb (112.9 kg)   Height: 5\' 6"  (1.676 m)    Body mass index is 40.19 kg/m.  Wt Readings from Last 3 Encounters:  07/07/23 249 lb (112.9 kg)  07/07/23 249 lb (112.9 kg)  05/06/23 261 lb (118.4 kg)     Objective:  Physical Exam Vitals and nursing note reviewed.  Constitutional:      Appearance: Normal appearance. He is obese.  Eyes:     Extraocular Movements: Extraocular movements intact.  Cardiovascular:     Rate and Rhythm: Normal rate and regular rhythm.     Heart sounds: Normal heart sounds.  Pulmonary:     Effort: Pulmonary effort is normal.     Comments: Decreased basilar breath sounds Abdominal:     Comments: Abdominal adiposity  Musculoskeletal:     Cervical back: Normal range of motion.  Skin:    General: Skin is warm.  Neurological:     General: No focal deficit present.     Mental Status: He is alert.  Psychiatric:        Mood and Affect: Mood normal.         Assessment And Plan:  Hypertensive heart and renal disease with renal failure, stage 1 through stage 4 or unspecified chronic kidney disease, with heart failure (HCC) Assessment & Plan: Chronic, uncontrolled due to non-compliance. He admits he is not taking meds as prescribed. He is only taking hydralazine once or twice daily. Importance of medication compliance  to avoid worsening of renal function was stressed to the patient. We went through each medication, indication and dosing schedule.   Chronic diastolic heart failure (HCC) Assessment & Plan: Most recent echo reviewed, nl EF with grade 2 DD. He is encouraged to follow low sodium diet, maintain optimal BP and to wear compression hose.    Stage 3b chronic kidney disease (HCC) Assessment & Plan: Chronic, he is encouraged to stay well hydrated, avoid NSAIDs and keep BP controlled to prevent progression of CKD.  Nephrology input is appreciated.   Orders: -     CMP14+EGFR -     CBC -  Iron, TIBC and Ferritin Panel  Coronary artery calcification Assessment & Plan: His coronary calcium score is 132. Importance of medication/dietary compliance was stressed to the patient. He will continue with rosuvastatin and ASA daily.    Pure hypercholesterolemia Assessment & Plan: Chronic, LDL goal is less than 70 due to underlying CAD. Will continue with rosuvastatin 20mg  daily.   Shortness of breath Assessment & Plan: This is a chronic issue. However, June 2024 echo revealed left-sided pleural effusion. Will send him for x-ray. Again, encouraged improved compliance with meds. I think his sx are also exacerbated by deconditioning.   Orders: -     Brain natriuretic peptide -     DG Chest 2 View; Future  Class 3 severe obesity due to excess calories with serious comorbidity and body mass index (BMI) of 40.0 to 44.9 in adult [facility]) Assessment & Plan: He is encouraged to initially strive for BMI less than 30 to decrease cardiac risk. He is advised to exercise no less than 150 minutes per week.     Non compliance w medication regimen  Other orders -     hydrALAZINE HCl; Take 1 tablet (100 mg total) by mouth 3 (three) times daily.  Dispense: 270 tablet; Refill: 2  He is encouraged to strive for BMI less than 30 to decrease cardiac risk. Advised to aim for at least 150 minutes of exercise per week.     Return for 1 year awv w thn & ov w rs. 4 month bpc.  Patient was given opportunity to ask questions. Patient verbalized understanding of the plan and was able to repeat key elements of the plan. All questions were answered to their satisfaction.    I, Gwynneth Aliment, MD, have reviewed all documentation for this visit. The documentation on 07/07/23 for the exam, diagnosis, procedures, and orders are all accurate and complete.   IF YOU HAVE BEEN REFERRED TO A SPECIALIST, IT MAY TAKE 1-2 WEEKS TO SCHEDULE/PROCESS THE REFERRAL. IF YOU HAVE NOT HEARD FROM US/SPECIALIST IN TWO WEEKS, PLEASE GIVE [place] A CALL AT [phone].   THE PATIENT IS ENCOURAGED TO PRACTICE SOCIAL DISTANCING DUE TO THE COVID-19 PANDEMIC.

## 2023-07-07 NOTE — Patient Instructions (Signed)
Hypertension, Adult Hypertension is another name for high blood pressure. High blood pressure forces your heart to work harder to pump blood. This can cause problems over time. There are two numbers in a blood pressure reading. There is a top number (systolic) over a bottom number (diastolic). It is best to have a blood pressure that is below 120/80. What are the causes? The cause of this condition is not known. Some other conditions can lead to high blood pressure. What increases the risk? Some lifestyle factors can make you more likely to develop high blood pressure: Smoking. Not getting enough exercise or physical activity. Being overweight. Having too much fat, sugar, calories, or salt (sodium) in your diet. Drinking too much alcohol. Other risk factors include: Having any of these conditions: Heart disease. Diabetes. High cholesterol. Kidney disease. Obstructive sleep apnea. Having a family history of high blood pressure and high cholesterol. Age. The risk increases with age. Stress. What are the signs or symptoms? High blood pressure may not cause symptoms. Very high blood pressure (hypertensive crisis) may cause: Headache. Fast or uneven heartbeats (palpitations). Shortness of breath. Nosebleed. Vomiting or feeling like you may vomit (nauseous). Changes in how you see. Very bad chest pain. Feeling dizzy. Seizures. How is this treated? This condition is treated by making healthy lifestyle changes, such as: Eating healthy foods. Exercising more. Drinking less alcohol. Your doctor may prescribe medicine if lifestyle changes do not help enough and if: Your top number is above 130. Your bottom number is above 80. Your personal target blood pressure may vary. Follow these instructions at home: Eating and drinking  If told, follow the DASH eating plan. To follow this plan: Fill one half of your plate at each meal with fruits and vegetables. Fill one fourth of your plate  at each meal with whole grains. Whole grains include whole-wheat pasta, brown rice, and whole-grain bread. Eat or drink low-fat dairy products, such as skim milk or low-fat yogurt. Fill one fourth of your plate at each meal with low-fat (lean) proteins. Low-fat proteins include fish, chicken without skin, eggs, beans, and tofu. Avoid fatty meat, cured and processed meat, or chicken with skin. Avoid pre-made or processed food. Limit the amount of salt in your diet to less than 1,500 mg each day. Do not drink alcohol if: Your doctor tells you not to drink. You are pregnant, may be pregnant, or are planning to become pregnant. If you drink alcohol: Limit how much you have to: 0-1 drink a day for women. 0-2 drinks a day for men. Know how much alcohol is in your drink. In the [place] one drink equals one 12 oz bottle of beer (355 mL), one 5 oz glass of wine (148 mL), or one 1 oz glass of hard liquor (44 mL). Lifestyle  Work with your doctor to stay at a healthy weight or to lose weight. Ask your doctor what the best weight is for you. Get at least 30 minutes of exercise that causes your heart to beat faster (aerobic exercise) most days of the week. This may include walking, swimming, or biking. Get at least 30 minutes of exercise that strengthens your muscles (resistance exercise) at least 3 days a week. This may include lifting weights or doing Pilates. Do not smoke or use any products that contain nicotine or tobacco. If you need help quitting, ask your doctor. Check your blood pressure at home as told by your doctor. Keep all follow-up visits. Medicines Take over-the-counter and prescription medicines   only as told by your doctor. Follow directions carefully. Do not skip doses of blood pressure medicine. The medicine does not work as well if you skip doses. Skipping doses also puts you at risk for problems. Ask your doctor about side effects or reactions to medicines that you should watch  for. Contact a doctor if: You think you are having a reaction to the medicine you are taking. You have headaches that keep coming back. You feel dizzy. You have swelling in your ankles. You have trouble with your vision. Get help right away if: You get a very bad headache. You start to feel mixed up (confused). You feel weak or numb. You feel faint. You have very bad pain in your: Chest. Belly (abdomen). You vomit more than once. You have trouble breathing. These symptoms may be an emergency. Get help right away. Call 911. Do not wait to see if the symptoms will go away. Do not drive yourself to the hospital. Summary Hypertension is another name for high blood pressure. High blood pressure forces your heart to work harder to pump blood. For most people, a normal blood pressure is less than 120/80. Making healthy choices can help lower blood pressure. If your blood pressure does not get lower with healthy choices, you may need to take medicine. This information is not intended to replace advice given to you by your health care provider. Make sure you discuss any questions you have with your health care provider. Document Revised: 05/21/2021 Document Reviewed: 05/21/2021 Elsevier Patient Education  2024 [employer]  

## 2023-07-07 NOTE — Progress Notes (Signed)
Subjective:   Andrew Schmitt is a 72 y.o. male who presents for Medicare Annual/Subsequent preventive examination.  Visit Complete: In person    Cardiac Risk Factors include: advanced age (>28men, >62 women);dyslipidemia;hypertension;male gender     Objective:    Today's Vitals   07/07/23 0826  BP: (!) 150/90  Pulse: 81  Temp: 98 F (36.7 C)  TempSrc: Oral  SpO2: 91%  Weight: 249 lb (112.9 kg)  Height: 5' 6.2" (1.681 m)   Body mass index is 39.95 kg/m.     07/07/2023    8:32 AM 06/16/2022    9:05 AM 04/23/2021    9:01 AM 04/03/2020    9:40 AM 03/29/2019    9:59 AM 11/02/2016   11:14 AM  Advanced Directives  Does Patient Have a Medical Advance Directive? Yes Yes Yes Yes Yes No  Type of Estate agent of [place];Living will Healthcare Power of [place];Living will Healthcare Power of [place];Living will Healthcare Power of [place];Living will Healthcare Power of [place];Living will   Copy of Healthcare Power of Attorney in Chart? No - copy requested No - copy requested No - copy requested No - copy requested No - copy requested   Would patient like information on creating a medical advance directive?      No - Patient declined    Current Medications (verified) Outpatient Encounter Medications as of 07/07/2023  Medication Sig   allopurinol (ZYLOPRIM) 300 MG tablet TAKE 1 TABLET BY MOUTH EVERY DAY   amLODipine (NORVASC) 10 MG tablet TAKE 1 TABLET (10 MG TOTAL) BY MOUTH DAILY. PLEASE CALL OFFICE TO SCHEDULE APPT.   aspirin EC 81 MG tablet Take 81 mg by mouth daily.   carvedilol (COREG) 6.25 MG tablet TAKE 1 TABLET BY MOUTH TWICE A DAY WITH FOOD   chlorthalidone (HYGROTON) 50 MG tablet Take 50 mg by mouth daily.   cholecalciferol (VITAMIN D) 1000 units tablet Take 1,000 Units by mouth daily.   docusate sodium (COLACE) 100 MG capsule Take 100 mg by mouth 2 (two) times daily.   Emollient (AVEENO POSITIVELY AGELESS EX) Apply 1 tablet topically daily.    finasteride (PROSCAR) 5 MG tablet TAKE 1 TABLET BY MOUTH EVERY DAY   furosemide (LASIX) 20 MG tablet TAKE 1 TABLET BY MOUTH EVERY DAY   Garlic 10 MG CAPS Take by mouth.   hydrALAZINE (APRESOLINE) 100 MG tablet TAKE 1 TABLET (100 MG TOTAL) BY MOUTH 3 (THREE) TIMES DAILY.   JARDIANCE 25 MG TABS tablet Take 25 mg by mouth every morning.   rosuvastatin (CRESTOR) 20 MG tablet Take 1 tablet (20 mg total) by mouth daily.   sildenafil (REVATIO) 20 MG tablet TAKE 1 TABLET BY MOUTH ONCE DAILY AS NEEDED   tamsulosin (FLOMAX) 0.4 MG CAPS capsule TAKE 1 CAPSLE BY MOUTH DAILY   vitamin B-12 (CYANOCOBALAMIN) 500 MCG tablet Take 500 mcg by mouth daily.   fluticasone (FLONASE) 50 MCG/ACT nasal spray Place 1 spray into both nostrils daily.   rosuvastatin (CRESTOR) 10 MG tablet TAKE 1 TABLET BY MOUTH EVERY DAY (Patient not taking: Reported on 07/07/2023)   No facility-administered encounter medications on file as of 07/07/2023.    Allergies (verified) Ace inhibitors   History: Past Medical History:  Diagnosis Date   Ascending aortic aneurysm (HCC) 03/19/2022   BPH (benign prostatic hyperplasia)    CKD (chronic kidney disease) stage 3, GFR 30-59 ml/min (HCC) 04/05/2019   CKD (chronic kidney disease), stage IV (HCC) 04/05/2019   Coronary artery calcification 03/19/2022  Erectile dysfunction    Gout    Gout    Heart murmur    Hyperlipidemia    Hypertension    Obesity    Past Surgical History:  Procedure Laterality Date   CARDIAC SURGERY     Family History  Problem Relation Age of Onset   Healthy Mother    Healthy Father    Social History   Socioeconomic History   Marital status: Married    Spouse name: Not on file   Number of children: 4   Years of education: Not on file   Highest education level: Not on file  Occupational History   Occupation: retired  Tobacco Use   Smoking status: Never   Smokeless tobacco: Never  Vaping Use   Vaping status: Never Used  Substance and Sexual  Activity   Alcohol use: No   Drug use: No   Sexual activity: Yes  Other Topics Concern   Not on file  Social History Narrative   Not on file   Social Determinants of Health   Financial Resource Strain: Low Risk  (07/07/2023)   Overall Financial Resource Strain (CARDIA)    Difficulty of Paying Living Expenses: Not hard at all  Food Insecurity: No Food Insecurity (07/07/2023)   Hunger Vital Sign    Worried About Running Out of Food in the Last Year: Never true    Ran Out of Food in the Last Year: Never true  Transportation Needs: No Transportation Needs (07/07/2023)   PRAPARE - Administrator, Civil Service (Medical): No    Lack of Transportation (Non-Medical): No  Physical Activity: Insufficiently Active (07/07/2023)   Exercise Vital Sign    Days of Exercise per Week: 2 days    Minutes of Exercise per Session: 30 min  Stress: No Stress Concern Present (07/07/2023)   [employer] of Occupational Health - Occupational Stress Questionnaire    Feeling of Stress : Not at all  Social Connections: Moderately Integrated (07/07/2023)   Social Connection and Isolation Panel [NHANES]    Frequency of Communication with Friends and Family: More than three times a week    Frequency of Social Gatherings with Friends and Family: Three times a week    Attends Religious Services: More than 4 times per year    Active Member of Clubs or Organizations: No    Attends Banker Meetings: Never    Marital Status: Married    Tobacco Counseling Counseling given: Not Answered   Clinical Intake:  Pre-visit preparation completed: Yes  Pain : No/denies pain     Nutritional Status: BMI > 30  Obese Nutritional Risks: None Diabetes: No  How often do you need to have someone help you when you read instructions, pamphlets, or other written materials from your doctor or pharmacy?: 1 - Never  Interpreter Needed?: No  Information entered by :: NAllen LPN   Activities  of Daily Living    07/07/2023    8:27 AM  In your present state of health, do you have any difficulty performing the following activities:  Hearing? 0  Vision? 0  Difficulty concentrating or making decisions? 0  Walking or climbing stairs? 1  Dressing or bathing? 0  Doing errands, shopping? 0  Preparing Food and eating ? N  Using the Toilet? N  In the past six months, have you accidently leaked urine? N  Do you have problems with loss of bowel control? N  Managing your Medications? N  Managing your Finances?  N  Housekeeping or managing your Housekeeping? N    Patient Care Team: Dorothyann Peng, MD as PCP - General (Internal Medicine) Chilton Si, MD as Attending Physician (Cardiology)  Indicate any recent Medical Services you may have received from other than [facility] providers in the past year (date may be approximate).     Assessment:   This is a routine wellness examination for [facility].  Hearing/Vision screen Hearing Screening - Comments:: Denies hearing issues Vision Screening - Comments:: No regular eye exams   Goals Addressed             This Visit's Progress    Patient Stated       07/07/2023, wants to get breathing under better condition       Depression Screen    07/07/2023    8:33 AM 12/07/2022   10:55 AM 06/16/2022    9:06 AM 04/23/2021    9:02 AM 04/09/2021    9:48 AM 04/03/2020    9:41 AM 04/02/2020   10:12 AM  PHQ 2/9 Scores  PHQ - 2 Score 0 0 0 0 0 0 0  PHQ- 9 Score 0 0   0 0     Fall Risk    07/07/2023    8:33 AM 12/07/2022   10:55 AM 06/16/2022    9:06 AM 04/23/2021    9:02 AM 04/09/2021    9:48 AM  Fall Risk   Falls in the past year? 0 0 0 0 0  Number falls in past yr: 0 0 0  0  Injury with Fall? 0 0 0  0  Risk for fall due to : Medication side effect No Fall Risks Medication side effect Medication side effect   Follow up Falls prevention discussed;Falls evaluation completed Falls evaluation completed Falls prevention  discussed;Education provided;Falls evaluation completed Falls evaluation completed;Education provided;Falls prevention discussed     MEDICARE RISK AT HOME: Medicare Risk at Home Any stairs in or around the home?: Yes If so, are there any without handrails?: No Home free of loose throw rugs in walkways, pet beds, electrical cords, etc?: Yes Adequate lighting in your home to reduce risk of falls?: Yes Life alert?: No Use of a cane, walker or w/c?: No Grab bars in the bathroom?: Yes Shower chair or bench in shower?: No Elevated toilet seat or a handicapped toilet?: No  TIMED UP AND GO:  Was the test performed?  Yes  Length of time to ambulate 10 feet: 5 sec Gait steady and fast without use of assistive device    Cognitive Function:        07/07/2023    8:34 AM 06/16/2022    9:07 AM 04/23/2021    9:03 AM 04/03/2020    9:42 AM 03/29/2019   10:03 AM  6CIT Screen  What Year? 0 points 0 points 0 points 0 points 0 points  What month? 0 points 0 points 0 points 0 points 0 points  What time? 0 points 0 points 0 points 0 points 0 points  Count back from 20 0 points 0 points 0 points 0 points 0 points  Months in reverse 0 points 0 points 0 points 2 points 2 points  Repeat phrase 0 points 0 points 2 points 0 points 0 points  Total Score 0 points 0 points 2 points 2 points 2 points    Immunizations Immunization History  Administered Date(s) Administered   Moderna Covid-19 Vaccine Bivalent Booster 62yrs & up 10/13/2021   Moderna Sars-Covid-2 Vaccination 08/24/2019, 09/21/2019  PNEUMOCOCCAL CONJUGATE-20 11/10/2021   Pfizer(Comirnaty)Fall Seasonal Vaccine 12 years and older 05/06/2023   Zoster Recombinant(Shingrix) 12/07/2022    TDAP status: Due, Education has been provided regarding the importance of this vaccine. Advised may receive this vaccine at local pharmacy or Health Dept. Aware to provide a copy of the vaccination record if obtained from local pharmacy or Health Dept. Verbalized  acceptance and understanding.  Flu Vaccine status: Completed at today's visit  Pneumococcal vaccine status: Up to date  Covid-19 vaccine status: Completed vaccines  Qualifies for Shingles Vaccine? Yes   Zostavax completed Yes   Shingrix Completed?: needs second dose  Screening Tests Health Maintenance  Topic Date Due   Zoster Vaccines- Shingrix (2 of 2) 02/01/2023   INFLUENZA VACCINE  Never done   DTaP/Tdap/Td (1 - Tdap) 07/06/2024 (Originally 09/25/1969)   COVID-19 Vaccine (5 - 2023-24 season) 09/05/2023   Medicare Annual Wellness (AWV)  07/06/2024   Colonoscopy  05/05/2031   Pneumonia Vaccine 79+ Years old  Completed   Hepatitis C Screening  Completed   HPV VACCINES  Aged Out    Health Maintenance  Health Maintenance Due  Topic Date Due   Zoster Vaccines- Shingrix (2 of 2) 02/01/2023   INFLUENZA VACCINE  Never done    Colorectal cancer screening: Type of screening: Colonoscopy. Completed 05/04/2021. Repeat every 10 years  Lung Cancer Screening: (Low Dose CT Chest recommended if Age 71-80 years, 20 pack-year currently smoking OR have quit w/in 15years.) does not qualify.   Lung Cancer Screening Referral: no  Additional Screening:  Hepatitis C Screening: does qualify; Completed 11/16/2012  Vision Screening: Recommended annual ophthalmology exams for early detection of glaucoma and other disorders of the eye. Is the patient up to date with their annual eye exam?  No  Who is the provider or what is the name of the office in which the patient attends annual eye exams? none If pt is not established with a provider, would they like to be referred to a provider to establish care? No .   Dental Screening: Recommended annual dental exams for proper oral hygiene  Diabetic Foot Exam: n/a  Community Resource Referral / Chronic Care Management: CRR required this visit?  No   CCM required this visit?  No     Plan:     I have personally reviewed and noted the following in  the patient's chart:   Medical and social history Use of alcohol, tobacco or illicit drugs  Current medications and supplements including opioid prescriptions. Patient is not currently taking opioid prescriptions. Functional ability and status Nutritional status Physical activity Advanced directives List of other physicians Hospitalizations, surgeries, and ER visits in previous 12 months Vitals Screenings to include cognitive, depression, and falls Referrals and appointments  In addition, I have reviewed and discussed with patient certain preventive protocols, quality metrics, and best practice recommendations. A written personalized care plan for preventive services as well as general preventive health recommendations were provided to patient.     Barb Merino, LPN   16/05/[id number]   After Visit Summary: (In Person-Printed) AVS printed and given to the patient  Nurse Notes: none

## 2023-07-07 NOTE — Patient Instructions (Signed)
[name] , Thank you for taking time to come for your Medicare Wellness Visit. I appreciate your ongoing commitment to your health goals. Please review the following plan we discussed and let me know if I can assist you in the future.   Referrals/Orders/Follow-Ups/Clinician Recommendations: none  This is a list of the screening recommended for you and due dates:  Health Maintenance  Topic Date Due   Zoster (Shingles) Vaccine (2 of 2) 02/01/2023   Flu Shot  Never done   DTaP/Tdap/Td vaccine (1 - Tdap) 07/06/2024*   COVID-19 Vaccine (5 - 2023-24 season) 09/05/2023   Medicare Annual Wellness Visit  07/06/2024   Colon Cancer Screening  05/05/2031   Pneumonia Vaccine  Completed   Hepatitis C Screening  Completed   HPV Vaccine  Aged Out  *Topic was postponed. The date shown is not the original due date.    Advanced directives: (Copy Requested) Please bring a copy of your health care power of attorney and living will to the office to be added to your chart at your convenience.  Next Medicare Annual Wellness Visit scheduled for next year: No, office will schedule appointment  insert Preventive Care attachment Insert FALL PREVENTION attachment if needed

## 2023-07-08 LAB — IRON,TIBC AND FERRITIN PANEL
Ferritin: 160 ng/mL (ref 30–400)
Iron Saturation: 14 % — ABNORMAL LOW (ref 15–55)
Iron: 34 ug/dL — ABNORMAL LOW (ref 38–169)
Total Iron Binding Capacity: 239 ug/dL — ABNORMAL LOW (ref 250–450)
UIBC: 205 ug/dL (ref 111–343)

## 2023-07-08 LAB — CMP14+EGFR
ALT: 17 [IU]/L (ref 0–44)
AST: 22 [IU]/L (ref 0–40)
Albumin: 3.6 g/dL — ABNORMAL LOW (ref 3.8–4.8)
Alkaline Phosphatase: 81 [IU]/L (ref 44–121)
BUN/Creatinine Ratio: 18 (ref 10–24)
BUN: 44 mg/dL — ABNORMAL HIGH (ref 8–27)
Bilirubin Total: 0.5 mg/dL (ref 0.0–1.2)
CO2: 23 mmol/L (ref 20–29)
Calcium: 8.6 mg/dL (ref 8.6–10.2)
Chloride: 104 mmol/L (ref 96–106)
Creatinine, Ser: 2.48 mg/dL — ABNORMAL HIGH (ref 0.76–1.27)
Globulin, Total: 3.7 g/dL (ref 1.5–4.5)
Glucose: 102 mg/dL — ABNORMAL HIGH (ref 70–99)
Potassium: 4.8 mmol/L (ref 3.5–5.2)
Sodium: 140 mmol/L (ref 134–144)
Total Protein: 7.3 g/dL (ref 6.0–8.5)
eGFR: 27 mL/min/{1.73_m2} — ABNORMAL LOW (ref >59)

## 2023-07-08 LAB — CBC
Hematocrit: 34.1 % — ABNORMAL LOW (ref 37.5–51.0)
Hemoglobin: 10.5 g/dL — ABNORMAL LOW (ref 13.0–17.7)
MCH: 28.6 pg (ref 26.6–33.0)
MCHC: 30.8 g/dL — ABNORMAL LOW (ref 31.5–35.7)
MCV: 93 fL (ref 79–97)
Platelets: 140 10*3/uL — ABNORMAL LOW (ref 150–450)
RBC: 3.67 x10E6/uL — ABNORMAL LOW (ref 4.14–5.80)
RDW: 12.1 % (ref 11.6–15.4)
WBC: 4.1 10*3/uL (ref 3.4–10.8)

## 2023-07-08 LAB — BRAIN NATRIURETIC PEPTIDE: BNP: 164.1 pg/mL — ABNORMAL HIGH (ref 0.0–100.0)

## 2023-07-08 MED ORDER — HYDRALAZINE HCL 100 MG PO TABS: 100.0000 mg | ORAL_TABLET | Freq: Three times a day (TID) | ORAL | 2 refills | Status: DC

## 2023-07-14 DIAGNOSIS — I5032 Chronic diastolic (congestive) heart failure: Secondary | ICD-10-CM | POA: Insufficient documentation

## 2023-07-14 NOTE — Assessment & Plan Note (Signed)
This is a chronic issue. However, June 2024 echo revealed left-sided pleural effusion. Will send him for x-ray. Again, encouraged improved compliance with meds. I think his sx are also exacerbated by deconditioning.

## 2023-07-14 NOTE — Assessment & Plan Note (Addendum)
Chronic, uncontrolled due to non-compliance. He admits he is not taking meds as prescribed. He is only taking hydralazine once or twice daily. Importance of medication compliance to avoid worsening of renal function was stressed to the patient. We went through each medication, indication and dosing schedule.

## 2023-07-14 NOTE — Assessment & Plan Note (Signed)
Chronic, he is encouraged to stay well hydrated, avoid NSAIDs and keep BP controlled to prevent progression of CKD.  Nephrology input is appreciated.

## 2023-07-14 NOTE — Assessment & Plan Note (Signed)
Most recent echo reviewed, nl EF with grade 2 DD. He is encouraged to follow low sodium diet, maintain optimal BP and to wear compression hose.

## 2023-07-14 NOTE — Assessment & Plan Note (Signed)
His coronary calcium score is 132. Importance of medication/dietary compliance was stressed to the patient. He will continue with rosuvastatin and ASA daily.

## 2023-07-14 NOTE — Assessment & Plan Note (Signed)
He is encouraged to initially strive for BMI less than 30 to decrease cardiac risk. He is advised to exercise no less than 150 minutes per week.

## 2023-07-14 NOTE — Assessment & Plan Note (Signed)
Chronic, LDL goal is less than 70 due to underlying CAD. Will continue with rosuvastatin 20mg  daily.

## 2023-07-21 ENCOUNTER — Emergency Department (HOSPITAL_COMMUNITY): Payer: Medicare PPO

## 2023-07-21 ENCOUNTER — Other Ambulatory Visit: Payer: Self-pay

## 2023-07-21 ENCOUNTER — Encounter (HOSPITAL_COMMUNITY): Payer: Self-pay

## 2023-07-21 ENCOUNTER — Inpatient Hospital Stay (HOSPITAL_COMMUNITY)
Admission: EM | Admit: 2023-07-21 | Discharge: 2023-08-17 | DRG: 291 | Disposition: A | Discharge status: Home Health | Payer: Medicare PPO | Attending: Family Medicine | Admitting: Family Medicine

## 2023-07-21 DIAGNOSIS — Y738 Miscellaneous gastroenterology and urology devices associated with adverse incidents, not elsewhere classified: Secondary | ICD-10-CM | POA: Diagnosis not present

## 2023-07-21 DIAGNOSIS — D631 Anemia in chronic kidney disease: Secondary | ICD-10-CM | POA: Diagnosis present

## 2023-07-21 DIAGNOSIS — J918 Pleural effusion in other conditions classified elsewhere: Secondary | ICD-10-CM | POA: Diagnosis present

## 2023-07-21 DIAGNOSIS — I5031 Acute diastolic (congestive) heart failure: Secondary | ICD-10-CM | POA: Diagnosis not present

## 2023-07-21 DIAGNOSIS — N138 Other obstructive and reflux uropathy: Secondary | ICD-10-CM | POA: Diagnosis present

## 2023-07-21 DIAGNOSIS — E8809 Other disorders of plasma-protein metabolism, not elsewhere classified: Secondary | ICD-10-CM | POA: Diagnosis present

## 2023-07-21 DIAGNOSIS — I5033 Acute on chronic diastolic (congestive) heart failure: Secondary | ICD-10-CM | POA: Diagnosis present

## 2023-07-21 DIAGNOSIS — E874 Mixed disorder of acid-base balance: Secondary | ICD-10-CM | POA: Diagnosis not present

## 2023-07-21 DIAGNOSIS — Z6841 Body Mass Index (BMI) 40.0 and over, adult: Secondary | ICD-10-CM

## 2023-07-21 DIAGNOSIS — I878 Other specified disorders of veins: Secondary | ICD-10-CM | POA: Diagnosis present

## 2023-07-21 DIAGNOSIS — I493 Ventricular premature depolarization: Secondary | ICD-10-CM | POA: Diagnosis not present

## 2023-07-21 DIAGNOSIS — R4189 Other symptoms and signs involving cognitive functions and awareness: Secondary | ICD-10-CM | POA: Diagnosis present

## 2023-07-21 DIAGNOSIS — I132 Hypertensive heart and chronic kidney disease with heart failure and with stage 5 chronic kidney disease, or end stage renal disease: Principal | ICD-10-CM | POA: Diagnosis present

## 2023-07-21 DIAGNOSIS — G9341 Metabolic encephalopathy: Secondary | ICD-10-CM | POA: Diagnosis not present

## 2023-07-21 DIAGNOSIS — N400 Enlarged prostate without lower urinary tract symptoms: Secondary | ICD-10-CM

## 2023-07-21 DIAGNOSIS — Z992 Dependence on renal dialysis: Secondary | ICD-10-CM

## 2023-07-21 DIAGNOSIS — R0602 Shortness of breath: Secondary | ICD-10-CM | POA: Diagnosis not present

## 2023-07-21 DIAGNOSIS — J9811 Atelectasis: Secondary | ICD-10-CM | POA: Diagnosis present

## 2023-07-21 DIAGNOSIS — Z87448 Personal history of other diseases of urinary system: Secondary | ICD-10-CM | POA: Diagnosis not present

## 2023-07-21 DIAGNOSIS — R5383 Other fatigue: Secondary | ICD-10-CM | POA: Diagnosis not present

## 2023-07-21 DIAGNOSIS — N3289 Other specified disorders of bladder: Secondary | ICD-10-CM | POA: Diagnosis not present

## 2023-07-21 DIAGNOSIS — E871 Hypo-osmolality and hyponatremia: Secondary | ICD-10-CM | POA: Diagnosis not present

## 2023-07-21 DIAGNOSIS — T50B95A Adverse effect of other viral vaccines, initial encounter: Secondary | ICD-10-CM | POA: Diagnosis present

## 2023-07-21 DIAGNOSIS — E440 Moderate protein-calorie malnutrition: Secondary | ICD-10-CM | POA: Diagnosis not present

## 2023-07-21 DIAGNOSIS — J948 Other specified pleural conditions: Secondary | ICD-10-CM | POA: Diagnosis not present

## 2023-07-21 DIAGNOSIS — R0989 Other specified symptoms and signs involving the circulatory and respiratory systems: Secondary | ICD-10-CM | POA: Diagnosis not present

## 2023-07-21 DIAGNOSIS — N179 Acute kidney failure, unspecified: Secondary | ICD-10-CM | POA: Diagnosis present

## 2023-07-21 DIAGNOSIS — D62 Acute posthemorrhagic anemia: Secondary | ICD-10-CM | POA: Diagnosis not present

## 2023-07-21 DIAGNOSIS — M109 Gout, unspecified: Secondary | ICD-10-CM | POA: Diagnosis present

## 2023-07-21 DIAGNOSIS — Y658 Other specified misadventures during surgical and medical care: Secondary | ICD-10-CM | POA: Diagnosis not present

## 2023-07-21 DIAGNOSIS — R918 Other nonspecific abnormal finding of lung field: Secondary | ICD-10-CM | POA: Diagnosis not present

## 2023-07-21 DIAGNOSIS — Z48813 Encounter for surgical aftercare following surgery on the respiratory system: Secondary | ICD-10-CM | POA: Diagnosis not present

## 2023-07-21 DIAGNOSIS — I2510 Atherosclerotic heart disease of native coronary artery without angina pectoris: Secondary | ICD-10-CM | POA: Diagnosis present

## 2023-07-21 DIAGNOSIS — N281 Cyst of kidney, acquired: Secondary | ICD-10-CM | POA: Diagnosis not present

## 2023-07-21 DIAGNOSIS — D6959 Other secondary thrombocytopenia: Secondary | ICD-10-CM | POA: Diagnosis present

## 2023-07-21 DIAGNOSIS — R7989 Other specified abnormal findings of blood chemistry: Secondary | ICD-10-CM

## 2023-07-21 DIAGNOSIS — I441 Atrioventricular block, second degree: Secondary | ICD-10-CM | POA: Diagnosis not present

## 2023-07-21 DIAGNOSIS — R310 Gross hematuria: Secondary | ICD-10-CM | POA: Diagnosis not present

## 2023-07-21 DIAGNOSIS — N186 End stage renal disease: Secondary | ICD-10-CM | POA: Diagnosis present

## 2023-07-21 DIAGNOSIS — K5730 Diverticulosis of large intestine without perforation or abscess without bleeding: Secondary | ICD-10-CM | POA: Diagnosis not present

## 2023-07-21 DIAGNOSIS — I517 Cardiomegaly: Secondary | ICD-10-CM | POA: Diagnosis not present

## 2023-07-21 DIAGNOSIS — R9341 Abnormal radiologic findings on diagnostic imaging of renal pelvis, ureter, or bladder: Secondary | ICD-10-CM | POA: Diagnosis not present

## 2023-07-21 DIAGNOSIS — I509 Heart failure, unspecified: Secondary | ICD-10-CM

## 2023-07-21 DIAGNOSIS — E785 Hyperlipidemia, unspecified: Secondary | ICD-10-CM | POA: Diagnosis present

## 2023-07-21 DIAGNOSIS — T83021A Displacement of indwelling urethral catheter, initial encounter: Secondary | ICD-10-CM | POA: Diagnosis not present

## 2023-07-21 DIAGNOSIS — T8383XA Hemorrhage of genitourinary prosthetic devices, implants and grafts, initial encounter: Secondary | ICD-10-CM | POA: Diagnosis not present

## 2023-07-21 DIAGNOSIS — R338 Other retention of urine: Secondary | ICD-10-CM | POA: Diagnosis not present

## 2023-07-21 DIAGNOSIS — Z79899 Other long term (current) drug therapy: Secondary | ICD-10-CM

## 2023-07-21 DIAGNOSIS — J9601 Acute respiratory failure with hypoxia: Secondary | ICD-10-CM | POA: Diagnosis not present

## 2023-07-21 DIAGNOSIS — E66813 Obesity, class 3: Secondary | ICD-10-CM | POA: Diagnosis present

## 2023-07-21 DIAGNOSIS — I358 Other nonrheumatic aortic valve disorders: Secondary | ICD-10-CM | POA: Diagnosis present

## 2023-07-21 DIAGNOSIS — Z7984 Long term (current) use of oral hypoglycemic drugs: Secondary | ICD-10-CM

## 2023-07-21 DIAGNOSIS — N19 Unspecified kidney failure: Secondary | ICD-10-CM | POA: Diagnosis not present

## 2023-07-21 DIAGNOSIS — I700 Atherosclerosis of aorta: Secondary | ICD-10-CM | POA: Diagnosis present

## 2023-07-21 DIAGNOSIS — R319 Hematuria, unspecified: Secondary | ICD-10-CM | POA: Diagnosis not present

## 2023-07-21 DIAGNOSIS — I110 Hypertensive heart disease with heart failure: Secondary | ICD-10-CM | POA: Diagnosis not present

## 2023-07-21 DIAGNOSIS — E66812 Obesity, class 2: Secondary | ICD-10-CM

## 2023-07-21 DIAGNOSIS — R001 Bradycardia, unspecified: Secondary | ICD-10-CM | POA: Diagnosis not present

## 2023-07-21 DIAGNOSIS — T502X5A Adverse effect of carbonic-anhydrase inhibitors, benzothiadiazides and other diuretics, initial encounter: Secondary | ICD-10-CM | POA: Diagnosis not present

## 2023-07-21 DIAGNOSIS — R011 Cardiac murmur, unspecified: Secondary | ICD-10-CM | POA: Diagnosis not present

## 2023-07-21 DIAGNOSIS — N184 Chronic kidney disease, stage 4 (severe): Secondary | ICD-10-CM

## 2023-07-21 DIAGNOSIS — I2489 Other forms of acute ischemic heart disease: Secondary | ICD-10-CM | POA: Diagnosis present

## 2023-07-21 DIAGNOSIS — J90 Pleural effusion, not elsewhere classified: Secondary | ICD-10-CM | POA: Diagnosis not present

## 2023-07-21 DIAGNOSIS — E8779 Other fluid overload: Secondary | ICD-10-CM

## 2023-07-21 DIAGNOSIS — R0609 Other forms of dyspnea: Secondary | ICD-10-CM | POA: Diagnosis not present

## 2023-07-21 DIAGNOSIS — Z888 Allergy status to other drugs, medicaments and biological substances status: Secondary | ICD-10-CM

## 2023-07-21 DIAGNOSIS — I472 Ventricular tachycardia, unspecified: Secondary | ICD-10-CM | POA: Diagnosis not present

## 2023-07-21 DIAGNOSIS — I455 Other specified heart block: Secondary | ICD-10-CM | POA: Diagnosis present

## 2023-07-21 DIAGNOSIS — I7121 Aneurysm of the ascending aorta, without rupture: Secondary | ICD-10-CM | POA: Diagnosis present

## 2023-07-21 DIAGNOSIS — J9690 Respiratory failure, unspecified, unspecified whether with hypoxia or hypercapnia: Secondary | ICD-10-CM | POA: Diagnosis not present

## 2023-07-21 DIAGNOSIS — I4891 Unspecified atrial fibrillation: Secondary | ICD-10-CM | POA: Diagnosis not present

## 2023-07-21 DIAGNOSIS — N1832 Chronic kidney disease, stage 3b: Secondary | ICD-10-CM | POA: Diagnosis not present

## 2023-07-21 DIAGNOSIS — R531 Weakness: Secondary | ICD-10-CM | POA: Diagnosis not present

## 2023-07-21 DIAGNOSIS — R54 Age-related physical debility: Secondary | ICD-10-CM | POA: Diagnosis present

## 2023-07-21 DIAGNOSIS — J810 Acute pulmonary edema: Secondary | ICD-10-CM | POA: Diagnosis not present

## 2023-07-21 DIAGNOSIS — N401 Enlarged prostate with lower urinary tract symptoms: Secondary | ICD-10-CM | POA: Diagnosis not present

## 2023-07-21 DIAGNOSIS — Z7982 Long term (current) use of aspirin: Secondary | ICD-10-CM

## 2023-07-21 DIAGNOSIS — J9602 Acute respiratory failure with hypercapnia: Secondary | ICD-10-CM | POA: Diagnosis not present

## 2023-07-21 DIAGNOSIS — I130 Hypertensive heart and chronic kidney disease with heart failure and stage 1 through stage 4 chronic kidney disease, or unspecified chronic kidney disease: Secondary | ICD-10-CM | POA: Diagnosis not present

## 2023-07-21 DIAGNOSIS — I959 Hypotension, unspecified: Secondary | ICD-10-CM | POA: Diagnosis not present

## 2023-07-21 DIAGNOSIS — T83028D Displacement of other indwelling urethral catheter, subsequent encounter: Secondary | ICD-10-CM | POA: Diagnosis not present

## 2023-07-21 DIAGNOSIS — G253 Myoclonus: Secondary | ICD-10-CM | POA: Diagnosis not present

## 2023-07-21 DIAGNOSIS — I440 Atrioventricular block, first degree: Secondary | ICD-10-CM | POA: Diagnosis not present

## 2023-07-21 DIAGNOSIS — G4733 Obstructive sleep apnea (adult) (pediatric): Secondary | ICD-10-CM | POA: Diagnosis present

## 2023-07-21 DIAGNOSIS — D649 Anemia, unspecified: Secondary | ICD-10-CM | POA: Diagnosis not present

## 2023-07-21 DIAGNOSIS — Z452 Encounter for adjustment and management of vascular access device: Secondary | ICD-10-CM | POA: Diagnosis not present

## 2023-07-21 DIAGNOSIS — N189 Chronic kidney disease, unspecified: Secondary | ICD-10-CM | POA: Diagnosis not present

## 2023-07-21 LAB — CBC WITH DIFFERENTIAL/PLATELET
Abs Immature Granulocytes: 0.01 10*3/uL (ref 0.00–0.07)
Basophils Absolute: 0 10*3/uL (ref 0.0–0.1)
Basophils Relative: 0 %
Eosinophils Absolute: 0.1 10*3/uL (ref 0.0–0.5)
Eosinophils Relative: 2 %
HCT: 34.1 % — ABNORMAL LOW (ref 39.0–52.0)
Hemoglobin: 10.4 g/dL — ABNORMAL LOW (ref 13.0–17.0)
Immature Granulocytes: 0 %
Lymphocytes Relative: 19 %
Lymphs Abs: 0.8 10*3/uL (ref 0.7–4.0)
MCH: 29.3 pg (ref 26.0–34.0)
MCHC: 30.5 g/dL (ref 30.0–36.0)
MCV: 96.1 fL (ref 80.0–100.0)
Monocytes Absolute: 0.4 10*3/uL (ref 0.1–1.0)
Monocytes Relative: 9 %
Neutro Abs: 3 10*3/uL (ref 1.7–7.7)
Neutrophils Relative %: 70 %
Platelets: 132 10*3/uL — ABNORMAL LOW (ref 150–400)
RBC: 3.55 MIL/uL — ABNORMAL LOW (ref 4.22–5.81)
RDW: 14.1 % (ref 11.5–15.5)
WBC: 4.3 10*3/uL (ref 4.0–10.5)
nRBC: 0.5 % — ABNORMAL HIGH (ref 0.0–0.2)

## 2023-07-21 LAB — I-STAT VENOUS BLOOD GAS, ED
Acid-Base Excess: 1 mmol/L (ref 0.0–2.0)
Bicarbonate: 26.2 mmol/L (ref 20.0–28.0)
Calcium, Ion: 1.15 mmol/L (ref 1.15–1.40)
HCT: 34 % — ABNORMAL LOW (ref 39.0–52.0)
Hemoglobin: 11.6 g/dL — ABNORMAL LOW (ref 13.0–17.0)
O2 Saturation: 81 %
Potassium: 4.6 mmol/L (ref 3.5–5.1)
Sodium: 138 mmol/L (ref 135–145)
TCO2: 28 mmol/L (ref 22–32)
pCO2, Ven: 45.4 mm[Hg] (ref 44–60)
pH, Ven: 7.369 (ref 7.25–7.43)
pO2, Ven: 47 mm[Hg] — ABNORMAL HIGH (ref 32–45)

## 2023-07-21 LAB — COMPREHENSIVE METABOLIC PANEL
ALT: 32 U/L (ref 0–44)
AST: 30 U/L (ref 15–41)
Albumin: 3.1 g/dL — ABNORMAL LOW (ref 3.5–5.0)
Alkaline Phosphatase: 69 U/L (ref 38–126)
Anion gap: 9 (ref 5–15)
BUN: 63 mg/dL — ABNORMAL HIGH (ref 8–23)
CO2: 25 mmol/L (ref 22–32)
Calcium: 8.8 mg/dL — ABNORMAL LOW (ref 8.9–10.3)
Chloride: 101 mmol/L (ref 98–111)
Creatinine, Ser: 3.18 mg/dL — ABNORMAL HIGH (ref 0.61–1.24)
GFR, Estimated: 20 mL/min — ABNORMAL LOW (ref >60)
Glucose, Bld: 128 mg/dL — ABNORMAL HIGH (ref 70–99)
Potassium: 4.5 mmol/L (ref 3.5–5.1)
Sodium: 135 mmol/L (ref 135–145)
Total Bilirubin: 0.8 mg/dL (ref <1.2)
Total Protein: 7.5 g/dL (ref 6.5–8.1)

## 2023-07-21 LAB — URINALYSIS, W/ REFLEX TO CULTURE (INFECTION SUSPECTED)
Bacteria, UA: NONE SEEN
Bilirubin Urine: NEGATIVE
Glucose, UA: 150 mg/dL — AB
Ketones, ur: NEGATIVE mg/dL
Leukocytes,Ua: NEGATIVE
Nitrite: NEGATIVE
Protein, ur: 100 mg/dL — AB
RBC / HPF: >50 RBC/hpf (ref 0–5)
Specific Gravity, Urine: 1.009 (ref 1.005–1.030)
pH: 5 (ref 5.0–8.0)

## 2023-07-21 LAB — MAGNESIUM: Magnesium: 2.9 mg/dL — ABNORMAL HIGH (ref 1.7–2.4)

## 2023-07-21 LAB — TROPONIN I (HIGH SENSITIVITY)
Troponin I (High Sensitivity): 337 ng/L (ref <18)
Troponin I (High Sensitivity): 278 ng/L (ref <18)
Troponin I (High Sensitivity): 309 ng/L (ref <18)

## 2023-07-21 LAB — BRAIN NATRIURETIC PEPTIDE: B Natriuretic Peptide: 183.1 pg/mL — ABNORMAL HIGH (ref 0.0–100.0)

## 2023-07-21 MED ORDER — TAMSULOSIN HCL 0.4 MG PO CAPS
0.4000 mg | ORAL_CAPSULE | Freq: Every day | ORAL | Status: DC
  Administered 2023-07-22 – 2023-08-17 (×26): 0.4 mg via ORAL
  Filled 2023-07-21 (×27): qty 1

## 2023-07-21 MED ORDER — HEPARIN SODIUM (PORCINE) 5000 UNIT/ML IJ SOLN: 5000.0000 [IU] | Freq: Three times a day (TID) | INTRAMUSCULAR | Status: DC

## 2023-07-21 MED ORDER — IPRATROPIUM-ALBUTEROL 0.5-2.5 (3) MG/3ML IN SOLN
3.0000 mL | Freq: Once | RESPIRATORY_TRACT | Status: AC
  Administered 2023-07-21: 3 mL via RESPIRATORY_TRACT
  Filled 2023-07-21: qty 3

## 2023-07-21 MED ORDER — FINASTERIDE 5 MG PO TABS
5.0000 mg | ORAL_TABLET | Freq: Every day | ORAL | Status: DC
  Administered 2023-07-22 – 2023-08-17 (×26): 5 mg via ORAL
  Filled 2023-07-21 (×28): qty 1

## 2023-07-21 MED ORDER — ACETAMINOPHEN 650 MG RE SUPP: 650.0000 mg | Freq: Four times a day (QID) | RECTAL | Status: DC | PRN

## 2023-07-21 MED ORDER — ALLOPURINOL 100 MG PO TABS: 300.0000 mg | ORAL_TABLET | Freq: Every day | ORAL | Status: DC

## 2023-07-21 MED ORDER — ASPIRIN 81 MG PO TBEC: 81.0000 mg | DELAYED_RELEASE_TABLET | Freq: Every day | ORAL | Status: DC

## 2023-07-21 MED ORDER — HYDRALAZINE HCL 50 MG PO TABS: 100.0000 mg | ORAL_TABLET | Freq: Two times a day (BID) | ORAL | Status: DC

## 2023-07-21 MED ORDER — ACETAMINOPHEN 325 MG PO TABS
650.0000 mg | ORAL_TABLET | Freq: Four times a day (QID) | ORAL | Status: DC | PRN
  Administered 2023-07-23 – 2023-08-17 (×9): 650 mg via ORAL
  Filled 2023-07-21 (×10): qty 2

## 2023-07-21 MED ORDER — ROSUVASTATIN CALCIUM 20 MG PO TABS: 20.0000 mg | ORAL_TABLET | Freq: Every day | ORAL | Status: DC

## 2023-07-21 MED ORDER — ASPIRIN 81 MG PO CHEW
324.0000 mg | CHEWABLE_TABLET | Freq: Once | ORAL | Status: AC
  Administered 2023-07-21: 324 mg via ORAL
  Filled 2023-07-21: qty 4

## 2023-07-21 MED ORDER — FUROSEMIDE 10 MG/ML IJ SOLN
40.0000 mg | Freq: Once | INTRAMUSCULAR | Status: AC
  Administered 2023-07-21: 40 mg via INTRAVENOUS
  Filled 2023-07-21: qty 4

## 2023-07-21 MED ORDER — AMLODIPINE BESYLATE 5 MG PO TABS: 10.0000 mg | ORAL_TABLET | Freq: Every day | ORAL | Status: DC

## 2023-07-21 MED ORDER — ROSUVASTATIN CALCIUM 5 MG PO TABS
10.0000 mg | ORAL_TABLET | Freq: Every day | ORAL | Status: DC
  Administered 2023-07-22 – 2023-08-17 (×26): 10 mg via ORAL
  Filled 2023-07-21 (×27): qty 2

## 2023-07-21 NOTE — Progress Notes (Addendum)
Pharmacy informed that patient's creatinine clearance is 23ml/min and recommending to hold allopurinol and decrease the dose of rosuvastatin to 10 mg.

## 2023-07-21 NOTE — ED Notes (Signed)
Patient transported to CT 

## 2023-07-21 NOTE — ED Triage Notes (Signed)
EMS [place] after finding him lethargic and brady.  Patient also complains of sob.  Patient 100RA but placed on NRB for comfort per ems.

## 2023-07-21 NOTE — ED Provider Notes (Signed)
Physical Exam  BP 118/72   Pulse 62   Temp 97.9 F (36.6 C) (Axillary)   Resp (!) 23   Ht 5\' 6"  (1.676 m)   Wt 115.2 kg   SpO2 100%   BMI 41.00 kg/m   Physical Exam Vitals reviewed.  Constitutional:      General: He is not in acute distress.    Appearance: He is obese. He is not ill-appearing.  Cardiovascular:     Rate and Rhythm: Normal rate and regular rhythm.  Pulmonary:     Effort: Pulmonary effort is normal.  Musculoskeletal:     Right lower leg: Edema present.     Left lower leg: Edema present.  Neurological:     Mental Status: He is alert.     Procedures  Procedures  ED Course / MDM   Clinical Course as of 07/22/23 0017  Thu Jul 21, 2023  1544 Admit pending dissection scan Acute onset generalized wknss Non-focal neuro exam Hx ascending aortic aneurysm HR in 30s-40s on scene Likely Mobitz 1 on EKG but difficult to r/o Mobitz 2 On NRB for comfort [JR]  1706 CT Angio Chest/Abd/Pel for Dissection W and/or Wo Contrast [JR]  1805 CT CHEST ABDOMEN PELVIS WO CONTRAST Reviewed. No large pericardial effusion. Large left and small right pleural effusions. No definitive aortic dissection [JR]    Clinical Course User Index [JR] Rolla Flatten, MD   Medical Decision Making Amount and/or Complexity of Data Reviewed Labs: ordered. Radiology: ordered and independent interpretation performed. Decision-making details documented in ED Course.  Risk OTC drugs. Prescription drug management. Decision regarding hospitalization.   72 year old male with a past medical history of ascending aortic aneurysm, prior pericardial effusion requiring intervention presents here for 1 week of generalized weakness.  Family members at bedside, states that symptoms began after receiving a flu vaccination.  Patient was reported to have bradycardia on scene with EMS with a heart rate of 30s to 40s.  ECG here demonstrated the same.  I reviewed patient's ECG.  Cardiology was consulted by  previous team and has also reviewed his ECG.  Cardiology believes arrhythmia is likely a Mobitz type I.  However, Mobitz type II cannot be excluded.  At the time that I assumed care of this patient, he was pending CTA chest/abdomen/pelvis.  Laboratory workup notable for elevated creatinine.  Patient not appropriate for contrasted imaging studies.  I performed a POCUS echo.  Ascending aorta is aneurysmal.  However, no definitive dissection flap.  There is no pericardial effusion or signs of tamponade.  I spoke with the patient's daughter, who is a physician.  She states that he had a pericardial effusion related to a malarial infection.  Given this, I feel that aortic dissection is less likely.  Will obtain CT chest/abdomen/pelvis without contrast given lower suspicion for dissection.  I independently reviewed the patient's imaging, which is notable for pleural effusions.  Given that the timing of the onset of patient's symptoms does correlate with recent immunization, myocarditis is certainly within the differential.  Workup also appears consistent with new heart failure versus heart failure exacerbation.  However, patient's prior echoes demonstrated normal EF.  Cardiology recommending IV Lasix, which were given to the patient while he was in the emergency department.  He remains on supplemental oxygen for comfort.  However, was never hypoxic.  Patient is felt to require admission.  He was discussed with hospitalist, Dr. Loney Loh, who has accepted him for admission.       Rolla Flatten,  MD 07/22/23 Elon Jester, DO 07/29/23 [phone]

## 2023-07-21 NOTE — ED Notes (Addendum)
X-ray at bedside

## 2023-07-21 NOTE — Consult Note (Signed)
Cardiology Consultation   Patient ID: [name] MRN: [id number]; DOB: 1951/02/11  Admit date: 07/21/2023 Date of Consult: 07/21/2023  PCP:  Andrew Peng, MD    HeartCare Providers Cardiologist:  Andrew Si, MD       Patient Profile:   Andrew Schmitt is a 72 y.o. male with a hx of HTN, HLD, CKD III, gout, obesity who is being seen 07/21/2023 for the evaluation of bradycardia at the request of Dr. Wilkie Schmitt.  History of Present Illness:   Andrew Schmitt is a 72 year old male with past medical history noted above.  He has been followed by Dr. Duke Schmitt as an outpatient.  He was initially seen for dyspnea on exertion April 2023.  Echocardiogram was ordered and showed LVEF of 60 to 65%, grade 2 diastolic dysfunction.  Blood pressures have historically been poorly controlled with limited options secondary to bradycardia and renal dysfunction.  He underwent renal Dopplers 11/2021 and renin/aldosterone were normal.  He has a calcium score of 132 which was 63rd percentile.  Last seen in the office 03/2022 with Dr. Duke Schmitt and reported feeling in his usual state of health.  Blood pressure was noted to be in the 120 range systolic.  He was continued on amlodipine, carvedilol, chlorthalidone and hydralazine.  Uses Lasix for lower extremity edema as well as compression stockings.  He is followed by [facility] for his chronic renal disease.  Has a history of a mild ascending aortic aneurysm of 4 cm on CT scan.  Recommended to follow-up in 6 months.  Presented to the ED 12/5 with complaints of acute onset of generalized weakness while at the gas station.  On EMS arrival he reported feeling short of breath misplaced on nasal cannula.  Heart rate was noted to be in the 40 bpm range.  Labs in the ED showed sodium 135, potassium 4.5, creatinine 3.18, magnesium 2.9, BNP 183, high-sensitivity troponin 337, WBC 4.3, hemoglobin 10.4.  Chest x-ray consistent with CHF, large left-sided  pleural effusion, small right-sided pleural effusion.  EKG showed sinus rhythm, 2nd AVB type I (though read as type II on report).   He tells me that when he bends over to get into the car, there is pressure between his abdomen and diaphragm that makes it difficult to breathe. This persisted today, causing him to call 911. He is on nonrebreather with good O2 sats (per notes, this was requested rather than nasal cannula given congestion). His family in the room notes that he has been having lower extremity edema for some time as well.  Past Medical History:  Diagnosis Date   Ascending aortic aneurysm (HCC) 03/19/2022   BPH (benign prostatic hyperplasia)    CKD (chronic kidney disease) stage 3, GFR 30-59 ml/min (HCC) 04/05/2019   CKD (chronic kidney disease), stage IV (HCC) 04/05/2019   Coronary artery calcification 03/19/2022   Erectile dysfunction    Gout    Gout    Heart murmur    Hyperlipidemia    Hypertension    Obesity     Past Surgical History:  Procedure Laterality Date   CARDIAC SURGERY       Inpatient Medications: Scheduled Meds:  aspirin  324 mg Oral Once   Continuous Infusions:  PRN Meds:   Allergies:    Allergies  Allergen Reactions   Ace Inhibitors Cough    Social History:   Social History   Socioeconomic History   Marital status: Married    Spouse name: Not on file  Number of children: 4   Years of education: Not on file   Highest education level: Not on file  Occupational History   Occupation: retired  Tobacco Use   Smoking status: Never   Smokeless tobacco: Never  Vaping Use   Vaping status: Never Used  Substance and Sexual Activity   Alcohol use: No   Drug use: No   Sexual activity: Yes  Other Topics Concern   Not on file  Social History Narrative   Not on file   Social Determinants of Health   Financial Resource Strain: Low Risk  (07/07/2023)   Overall Financial Resource Strain (CARDIA)    Difficulty of Paying Living Expenses: Not hard  at all  Food Insecurity: No Food Insecurity (07/07/2023)   Hunger Vital Sign    Worried About Running Out of Food in the Last Year: Never true    Ran Out of Food in the Last Year: Never true  Transportation Needs: No Transportation Needs (07/07/2023)   PRAPARE - Administrator, Civil Service (Medical): No    Lack of Transportation (Non-Medical): No  Physical Activity: Insufficiently Active (07/07/2023)   Exercise Vital Sign    Days of Exercise per Week: 2 days    Minutes of Exercise per Session: 30 min  Stress: No Stress Concern Present (07/07/2023)   [employer] of Occupational Health - Occupational Stress Questionnaire    Feeling of Stress : Not at all  Social Connections: Moderately Integrated (07/07/2023)   Social Connection and Isolation Panel [NHANES]    Frequency of Communication with Friends and Family: More than three times a week    Frequency of Social Gatherings with Friends and Family: Three times a week    Attends Religious Services: More than 4 times per year    Active Member of Clubs or Organizations: No    Attends Banker Meetings: Never    Marital Status: Married  Catering manager Violence: Not At Risk (07/07/2023)   Humiliation, Afraid, Rape, and Kick questionnaire    Fear of Current or Ex-Partner: No    Emotionally Abused: No    Physically Abused: No    Sexually Abused: No    Family History:    Family History  Problem Relation Age of Onset   Healthy Mother    Healthy Father      ROS:  Please see the history of present illness.   All other ROS reviewed and negative.     Physical Exam/Data:   Vitals:   07/21/23 1426 07/21/23 1441 07/21/23 1445 07/21/23 1530  BP:   108/69 118/72  Pulse:   67 62  Resp:   (!) 21 (!) 23  Temp:      TempSrc:      SpO2: 100%  100% 100%  Weight:  115.2 kg    Height:  5\' 6"  (1.676 m)     No intake or output data in the 24 hours ending 07/21/23 1744    07/21/2023    2:41 PM 07/21/2023     1:58 PM 07/07/2023    8:39 AM  Last 3 Weights  Weight (lbs) 254 lb 254 lb 249 lb  Weight (kg) 115.214 kg 115.214 kg 112.946 kg     Body mass index is 41 kg/m.  General:  Well nourished, well developed, in no acute distress, on O2 by nonrebreather HEENT: normal Neck: JVD elevated to jaw at 30 degrees Vascular: No carotid bruits; Distal pulses 2+ bilaterally Cardiac:  normal S1, S2;  RRR; no murmur appreciated Lungs:  distant, diminished at bilateral bases, left more diminished than right Abd: soft, nontender, no hepatomegaly  Ext: bilateral 2+ LE edema Musculoskeletal:  No deformities Skin: warm and dry  Neuro:  CNs 2-12 intact, no focal abnormalities noted Psych:  Normal affect   EKG:  The EKG was personally reviewed and demonstrates:  read as second degree AV block, Mobitz 2, but it is actually Mobitz 1 (Wenkebach) Telemetry:  Telemetry was personally reviewed and demonstrates:  largely sinus, with occasional blocked PACs and 2nd degree type I (Wenkebach)  Relevant CV Studies: Echo 01/26/23 1. Left ventricular ejection fraction, by estimation, is 60 to 65%. The  left ventricle has normal function. The left ventricle has no regional  wall motion abnormalities. There is moderate concentric left ventricular  hypertrophy. Left ventricular  diastolic parameters are consistent with Grade II diastolic dysfunction  (pseudonormalization). Elevated left ventricular end-diastolic pressure.   2. Right ventricular systolic function is mildly reduced. The right  ventricular size is moderately enlarged. Tricuspid regurgitation signal is  inadequate for assessing PA pressure.   3. Left atrial size was mildly dilated.   4. Large pleural effusion in the left lateral region.   5. The mitral valve is normal in structure. Trivial mitral valve  regurgitation. No evidence of mitral stenosis.   6. The aortic valve has an indeterminant number of cusps. Aortic valve  regurgitation is not visualized.  Aortic valve sclerosis/calcification is  present, without any evidence of aortic stenosis.   7. The inferior vena cava is normal in size with <50% respiratory  variability, suggesting right atrial pressure of 8 mmHg.   8. Ascending aorta measurements are within normal limits for age when  indexed to body surface area.   Laboratory Data:  High Sensitivity Troponin:   Recent Labs  Lab 07/21/23 1415  TROPONINIHS 337*     Chemistry Recent Labs  Lab 07/21/23 1415 07/21/23 1420  NA 135 138  K 4.5 4.6  CL 101  --   CO2 25  --   GLUCOSE 128*  --   BUN 63*  --   CREATININE 3.18*  --   CALCIUM 8.8*  --   MG 2.9*  --   GFRNONAA 20*  --   ANIONGAP 9  --     Recent Labs  Lab 07/21/23 1415  PROT 7.5  ALBUMIN 3.1*  AST 30  ALT 32  ALKPHOS 69  BILITOT 0.8   Lipids No results for input(s): "CHOL", "TRIG", "HDL", "LABVLDL", "LDLCALC", "CHOLHDL" in the last 168 hours.  Hematology Recent Labs  Lab 07/21/23 1415 07/21/23 1420  WBC 4.3  --   RBC 3.55*  --   HGB 10.4* 11.6*  HCT 34.1* 34.0*  MCV 96.1  --   MCH 29.3  --   MCHC 30.5  --   RDW 14.1  --   PLT 132*  --    Thyroid No results for input(s): "TSH", "FREET4" in the last 168 hours.  BNP Recent Labs  Lab 07/21/23 1415  BNP 183.1*    DDimer No results for input(s): "DDIMER" in the last 168 hours.   Radiology/Studies:  DG Chest Portable 1 View  Result Date: 07/21/2023 CLINICAL DATA:  SOB EXAM: PORTABLE CHEST 1 VIEW COMPARISON:  01/08/2004. FINDINGS: Cardiac silhouette appears prominent and partially obscured by large left-sided pleural effusion. Small right-sided pleural effusion. Pulmonary vascular congestion consistent with pulmonary edema. No pneumothorax. There are thoracic degenerative changes. IMPRESSION: Findings consistent with CHF. Electronically  Signed   By: Layla Maw M.D.   On: 07/21/2023 15:51     Assessment and Plan:   Andrew Schmitt is a 72 y.o. male with a hx of HTN, HLD, CKD III, gout,  obesity who is being seen 07/21/2023 for the evaluation of bradycardia at the request of Dr. Wilkie Schmitt.  AV block, second degree -this is actually Mobitz 1, not mobitz 2, so not high risk -has had some blocked PACs but no significant pauses -has not had significant bradycardia on my review of telemetry here, though reported as HR 30s-40s on EMS arrival. For now, would hold carvedilol and monitor  Volume overload Acute kidney injury on chronic kidney disease stage 4 History of diastolic dysfunction with preserved EF on echo -denies chest pain, though first troponin elevated. Will need to determine trend--given his renal function and respiratory status, this Andrew be demand -unclear etiology of his volume overload. Did have large pleural effusion previously. This Andrew need to be drained during this admission.  -would give IV lasix, though if Cr rises will need to get nephrology involved  Hypertension -normal to borderline low today -holding carvedilol as above -limited options given CKD -ok to continue amlodipine, hydralazine -hold chlorthalidone and oral lasix while getting IV lasxi  Coronary artery calcification Hyperlipidemia -continue rosuvastatin, aspirin  We will continue to follow.  Risk Assessment/Risk Scores:        New York Heart Association (NYHA) Functional Class NYHA Class IV        For questions or updates, please contact [facility] Please consult [email or web address] for contact info under   Note prepared with the assistance of Laverda Page, [place] Signed, Jodelle Red, MD  07/21/2023 5:44 PM

## 2023-07-21 NOTE — ED Notes (Addendum)
Pt attempted to use nasal canula after duoneb treatment but states he is too congested for it to work. Pt felt like he needed "more air" even though O2 sat was 99% on 4 L. Pt was put on NRB mask at 6L and is now resting comfortably. EDP aware  Zoll and pads in room

## 2023-07-21 NOTE — Progress Notes (Signed)
ER resident Dr. Jean Rosenthal reached out to review of the patient's EKG.  Patient was at a gas station started feeling not well all of a sudden EMS was called and patient was brought to the hospital.  Noted to be bradycardic on telemetry.  EKG performed.  Independently reviewed underlying rhythm is sinus with secondary type I AV block.  During my discussion with the the ER resident telemetry was reported to be at 39 bpm.  Based on medication list he is on amlodipine and carvedilol which could be contributory.  Stop these medications for now he will need to be monitored closely for washout.  Blood pressures are stable.  Could consider glucagon to reverse beta-blockers if clinically indicated.  Cardiology will see in consult.  Admit to medicine.  Kamara Allan Zalma, [place], [facility] 2:43 PM  07/21/23

## 2023-07-21 NOTE — ED Provider Notes (Signed)
[place] EMERGENCY DEPARTMENT AT [facility] Provider Note   CSN: [id number] Arrival date & time: 07/21/23  1338     History  Chief Complaint  Patient presents with   Bradycardia   Shortness of Breath   Fatigue    Andrew Schmitt is a 72 y.o. male HTN, CHF, CKD, ascending aortic aneurysm who presented to the ED for generalized weakness.  Patient reports he had acute onset of generalized weakness while at the gas station today.  Reports he went into the gas station so that they could call 911 because he felt like he could not catch his breath.  He otherwise denies symptoms including chest pain, abdominal pain, vomiting, diarrhea.  Endorses having similar symptoms when he is had episodes of his "diaphragm pushing on my intestines," which he reports makes his heart rate drop.  He denies fever, coughing, congestion.  EMS reports they put him on a nasal cannula for comfort and then he requested a nonrebreather because of nasal congestion, but deny him having hypoxia.  Report his heart rate was low in the 40s-60s with them but blood pressure was stable. HPI     Home Medications Prior to Admission medications   Medication Sig Start Date End Date Taking? Authorizing Provider  allopurinol (ZYLOPRIM) 300 MG tablet TAKE 1 TABLET BY MOUTH EVERY DAY 07/05/23   Dorothyann Peng, MD  amLODipine (NORVASC) 10 MG tablet TAKE 1 TABLET (10 MG TOTAL) BY MOUTH DAILY. PLEASE CALL OFFICE TO SCHEDULE APPT. 07/01/23   Dorothyann Peng, MD  aspirin EC 81 MG tablet Take 81 mg by mouth daily.    [provider]  carvedilol (COREG) 6.25 MG tablet TAKE 1 TABLET BY MOUTH TWICE A DAY WITH FOOD 06/01/23   Dorothyann Peng, MD  chlorthalidone (HYGROTON) 50 MG tablet Take 50 mg by mouth daily.    [provider]  cholecalciferol (VITAMIN D) 1000 units tablet Take 1,000 Units by mouth daily.    [provider]  docusate sodium (COLACE) 100 MG capsule Take 100 mg by mouth 2 (two) times  daily.    [provider]  Emollient (AVEENO POSITIVELY AGELESS EX) Apply 1 tablet topically daily.    [provider]  finasteride (PROSCAR) 5 MG tablet TAKE 1 TABLET BY MOUTH EVERY DAY 12/17/22   Dorothyann Peng, MD  fluticasone [facility]) 50 MCG/ACT nasal spray Place 1 spray into both nostrils daily. 10/13/21 10/13/22  Dorothyann Peng, MD  furosemide (LASIX) 20 MG tablet TAKE 1 TABLET BY MOUTH EVERY DAY 06/21/23   Dorothyann Peng, MD  Garlic 10 MG CAPS Take by mouth.    [provider]  hydrALAZINE (APRESOLINE) 100 MG tablet Take 1 tablet (100 mg total) by mouth 3 (three) times daily. 07/08/23   Dorothyann Peng, MD  JARDIANCE 25 MG TABS tablet Take 25 mg by mouth every morning. 03/05/22   [provider]  rosuvastatin (CRESTOR) 10 MG tablet TAKE 1 TABLET BY MOUTH EVERY DAY Patient not taking: Reported on 07/07/2023 06/21/23   Dorothyann Peng, MD  rosuvastatin (CRESTOR) 20 MG tablet Take 1 tablet (20 mg total) by mouth daily. 04/27/22   Chilton Si, MD  sildenafil (REVATIO) 20 MG tablet TAKE 1 TABLET BY MOUTH ONCE DAILY AS NEEDED 01/05/23   Dorothyann Peng, MD  tamsulosin (FLOMAX) 0.4 MG CAPS capsule TAKE 1 CAPSLE BY MOUTH DAILY 09/07/22   McKenzie, Mardene Celeste, MD  vitamin B-12 (CYANOCOBALAMIN) 500 MCG tablet Take 500 mcg by mouth daily.    [provider]      Allergies    Ace inhibitors    Review of Systems   Review of Systems  Physical Exam Updated Vital Signs BP 118/72   Pulse 62   Temp 97.9 F (36.6 C) (Axillary)   Resp (!) 23   Ht 5\' 6"  (1.676 m)   Wt 115.2 kg   SpO2 100%   BMI 41.00 kg/m  Physical Exam Constitutional:      General: He is not in acute distress.    Appearance: He is well-developed. He is not ill-appearing.  HENT:     Head: Normocephalic and atraumatic.     Mouth/Throat:     Mouth: Mucous membranes are moist.     Pharynx: Oropharynx is clear.  Eyes:     Pupils: Pupils are equal, round, and reactive to light.   Cardiovascular:     Rate and Rhythm: Regular rhythm. Bradycardia present.     Heart sounds: No murmur heard.    No friction rub. No gallop.  Pulmonary:     Effort: Pulmonary effort is normal.     Breath sounds: Wheezing present. No rhonchi or rales.     Comments: Breathsounds diminished, scattered wheezes Musculoskeletal:        General: Normal range of motion.     Cervical back: Normal range of motion and neck supple.     Right lower leg: Edema present.     Left lower leg: Edema present.     Comments: Radial pulses 2+ bilaterally  Skin:    General: Skin is warm and dry.     Capillary Refill: Capillary refill takes less than 2 seconds.  Neurological:     General: No focal deficit present.     Mental Status: He is alert.     Comments: Sensation and motor function intact in all 4 extremities     ED Results / Procedures / Treatments   Labs (all labs ordered are listed, but only abnormal results are displayed) Labs Reviewed  BRAIN NATRIURETIC PEPTIDE - Abnormal; Notable for the following components:      Result Value   B Natriuretic Peptide 183.1 (*)    All other components within normal limits  COMPREHENSIVE METABOLIC PANEL - Abnormal; Notable for the following components:   Glucose, Bld 128 (*)    BUN 63 (*)    Creatinine, Ser 3.18 (*)    Calcium 8.8 (*)    Albumin 3.1 (*)    GFR, Estimated 20 (*)    All other components within normal limits  CBC WITH DIFFERENTIAL/PLATELET - Abnormal; Notable for the following components:   RBC 3.55 (*)    Hemoglobin 10.4 (*)    HCT 34.1 (*)    Platelets 132 (*)    nRBC 0.5 (*)    All other components within normal limits  MAGNESIUM - Abnormal; Notable for the following components:   Magnesium 2.9 (*)    All other components within normal limits  I-STAT VENOUS BLOOD GAS, ED - Abnormal; Notable for the following components:   pO2, Ven 47 (*)    HCT 34.0 (*)    Hemoglobin 11.6 (*)    All other components within normal limits   TROPONIN I (HIGH SENSITIVITY) - Abnormal; Notable for the following components:   Troponin I (High Sensitivity) 337 (*)    All other components within normal limits    EKG EKG Interpretation Date/Time:  Thursday July 21 2023 13:57:19 EST Ventricular Rate:  59 PR Interval:  302  QRS Duration:  122 QT Interval:  494 QTC Calculation: 510 R Axis:   121  Text Interpretation: Second degree AV block, Mobitz II Sinus pause Probable left atrial enlargement Nonspecific intraventricular conduction delay Abnormal lateral Q waves Minimal ST elevation, inferior leads Confirmed by Gerhard Munch [phone]) on 07/21/2023 2:05:53 PM  Radiology DG Chest Portable 1 View  Result Date: 07/21/2023 CLINICAL DATA:  SOB EXAM: PORTABLE CHEST 1 VIEW COMPARISON:  01/08/2004. FINDINGS: Cardiac silhouette appears prominent and partially obscured by large left-sided pleural effusion. Small right-sided pleural effusion. Pulmonary vascular congestion consistent with pulmonary edema. No pneumothorax. There are thoracic degenerative changes. IMPRESSION: Findings consistent with CHF. Electronically Signed   By: Layla Maw M.D.   On: 07/21/2023 15:51    Procedures Procedures    Medications Ordered in ED Medications  ipratropium-albuterol (DUONEB) 0.5-2.5 (3) MG/3ML nebulizer solution 3 mL (3 mLs Nebulization Given 07/21/23 1356)    ED Course/ Medical Decision Making/ A&P Clinical Course as of 07/21/23 1603  Thu Jul 21, 2023  1544 Admit pending dissection scan Acute onset generalized wknss Non-focal neuro exam Hx ascending aortic aneurysm HR in 30s-40s on scene Likely Mobitz 1 on EKG but difficult to r/o Mobitz 2 On NRB for comfort [JR]    Clinical Course User Index [JR] Rolla Flatten, MD                                 Medical Decision Making Amount and/or Complexity of Data Reviewed Labs: ordered. Radiology: ordered.  Risk Prescription drug management.   Vital signs stable, patient 100% on  room air, although patient request nonrebreather for his comfort.  Physical exam with pitting edema bilaterally and diminished breath sounds with scattered wheezing.  CBC with mild anemia.  BNP 183.  Troponin 337 initially.  Blood gas with pH 7.36, pCO2 45.  EKG concerning for Mobitz 1 versus 2 AV block, difficult to tell due to wandering baseline so was repeated multiple times.  EKG was discussed with cardiology, who feels it is reflective of a second-degree type I.  POCUS of the heart performed, which showed no pericardial effusion or signs of cardiac tamponade.  CT dissection protocol ordered to evaluate for acute aortic syndrome given patient's history of an ascending aortic aneurysm.  He was signed out to the oncoming physician in stable condition, admission pending results of CT scan and further labs.        Final Clinical Impression(s) / ED Diagnoses Final diagnoses:  Symptomatic bradycardia    Rx / DC Orders ED Discharge Orders     None         Janyth Pupa, MD 07/21/23 1603    Gerhard Munch, MD 07/22/23 1601

## 2023-07-21 NOTE — H&P (Signed)
History and Physical    Andrew Schmitt MWN:[id number] DOB: 1951-06-12 DOA: 07/21/2023  PCP: Dorothyann Peng, MD  Patient coming from: Home  Chief Complaint: Generalized weakness  HPI: Andrew Schmitt is a 72 y.o. male with medical history significant of chronic HFpEF, hypertension, hyperlipidemia, ascending aortic aneurysm, BPH, CKD stage IIIb, coronary artery calcification, chronic anemia and thrombocytopenia, gout, class III obesity (BMI 41.0) presented to ED with complaints of generalized weakness and shortness of breath.  Patient was satting 100% on room air but placed on nonrebreather for comfort.  Patient initially declined nasal cannula due to congestion.  Noted to be bradycardic with heart rate in the 30s to 40s with EMS but blood pressure stable.  EKG concerning for second-degree type I versus type II AV block.  Cardiology saw the patient and reviewed his EKGs and telemetry, felt this was second-degree type I AV block.  POCUS of the heart performed in the ED and showed no pericardial effusion or signs of cardiac tamponade.  Labs notable for hemoglobin 10.4 (stable), platelet count 132k (stable), potassium 4.5, magnesium 2.9, creatinine 3.1 (previously 2.0-2.4), BNP 183, VBG with pH 7.36 and pCO2 45.4, troponin 337> 278> 309, UA pending.  CT chest abdomen pelvis showing moderate left and small right pleural effusions with compressive atelectasis of the left lower lobe and portion of the left upper lobe, cardiomegaly, mild bilateral perinephric fat stranding, and mild diffuse body wall edema.  Bilateral renal cysts also seen on CT for which radiologist is recommending no follow-up imaging. Patient was given aspirin 324 mg, IV Lasix 40 mg, and DuoNeb treatment.  TR[facility] called to admit.  Patient states he received flu shot a week ago and since then having shortness of breath and fatigue.  Every time he tries to sit up, he feels tightness across his upper abdomen which makes it difficult for him to  breathe and he has to open the windows.  Also reporting worsening bilateral lower extremity edema.  Denies any pain or pressure in his chest.  Denies vomiting, flank pain, or any urinary symptoms.  Review of Systems:  Review of Systems  All other systems reviewed and are negative.   Past Medical History:  Diagnosis Date   Ascending aortic aneurysm (HCC) 03/19/2022   BPH (benign prostatic hyperplasia)    CKD (chronic kidney disease) stage 3, GFR 30-59 ml/min (HCC) 04/05/2019   CKD (chronic kidney disease), stage IV (HCC) 04/05/2019   Coronary artery calcification 03/19/2022   Erectile dysfunction    Gout    Gout    Heart murmur    Hyperlipidemia    Hypertension    Obesity     Past Surgical History:  Procedure Laterality Date   CARDIAC SURGERY       reports that he has never smoked. He has never used smokeless tobacco. He reports that he does not drink alcohol and does not use drugs.  Allergies  Allergen Reactions   Ace Inhibitors Cough    Family History  Problem Relation Age of Onset   Healthy Mother    Healthy Father     Prior to Admission medications   Medication Sig Start Date End Date Taking? Authorizing Provider  allopurinol (ZYLOPRIM) 300 MG tablet TAKE 1 TABLET BY MOUTH EVERY DAY 07/05/23   Dorothyann Peng, MD  amLODipine (NORVASC) 10 MG tablet TAKE 1 TABLET (10 MG TOTAL) BY MOUTH DAILY. PLEASE CALL OFFICE TO SCHEDULE APPT. 07/01/23   Dorothyann Peng, MD  aspirin EC 81 MG tablet Take  81 mg by mouth daily.    [provider]  carvedilol (COREG) 6.25 MG tablet TAKE 1 TABLET BY MOUTH TWICE A DAY WITH FOOD 06/01/23   Dorothyann Peng, MD  chlorthalidone (HYGROTON) 50 MG tablet Take 50 mg by mouth daily.    [provider]  cholecalciferol (VITAMIN D) 1000 units tablet Take 1,000 Units by mouth daily.    [provider]  docusate sodium (COLACE) 100 MG capsule Take 100 mg by mouth 2 (two) times daily.    [provider]  Emollient (AVEENO  POSITIVELY AGELESS EX) Apply 1 tablet topically daily.    [provider]  finasteride (PROSCAR) 5 MG tablet TAKE 1 TABLET BY MOUTH EVERY DAY 12/17/22   Dorothyann Peng, MD  fluticasone [employer]) 50 MCG/ACT nasal spray Place 1 spray into both nostrils daily. 10/13/21 10/13/22  Dorothyann Peng, MD  furosemide (LASIX) 20 MG tablet TAKE 1 TABLET BY MOUTH EVERY DAY 06/21/23   Dorothyann Peng, MD  Garlic 10 MG CAPS Take by mouth.    [provider]  hydrALAZINE (APRESOLINE) 100 MG tablet Take 1 tablet (100 mg total) by mouth 3 (three) times daily. 07/08/23   Dorothyann Peng, MD  JARDIANCE 25 MG TABS tablet Take 25 mg by mouth every morning. 03/05/22   [provider]  rosuvastatin (CRESTOR) 10 MG tablet TAKE 1 TABLET BY MOUTH EVERY DAY Patient not taking: Reported on 07/07/2023 06/21/23   Dorothyann Peng, MD  rosuvastatin (CRESTOR) 20 MG tablet Take 1 tablet (20 mg total) by mouth daily. 04/27/22   Chilton Si, MD  sildenafil (REVATIO) 20 MG tablet TAKE 1 TABLET BY MOUTH ONCE DAILY AS NEEDED 01/05/23   Dorothyann Peng, MD  tamsulosin (FLOMAX) 0.4 MG CAPS capsule TAKE 1 CAPSLE BY MOUTH DAILY 09/07/22   McKenzie, Mardene Celeste, MD  vitamin B-12 (CYANOCOBALAMIN) 500 MCG tablet Take 500 mcg by mouth daily.    [provider]    Physical Exam: Vitals:   07/21/23 1605 07/21/23 1815 07/21/23 1826 07/21/23 1848  BP:  118/66    Pulse: 63 65    Resp: (!) 22 (!) 22    Temp:   98 F (36.7 C)   TempSrc:   Oral   SpO2: 100% 100%  100%  Weight:      Height:        Physical Exam Vitals reviewed.  Constitutional:      General: He is not in acute distress. HENT:     Head: Normocephalic and atraumatic.  Eyes:     Extraocular Movements: Extraocular movements intact.  Cardiovascular:     Rate and Rhythm: Normal rate and regular rhythm.     Pulses: Normal pulses.  Pulmonary:     Effort: No respiratory distress.     Breath sounds: No wheezing or rales.     Comments: Slightly  tachypneic Abdominal:     General: Bowel sounds are normal. There is no distension.     Palpations: Abdomen is soft.     Tenderness: There is no abdominal tenderness.  Musculoskeletal:     Cervical back: Normal range of motion.     Right lower leg: Edema present.     Left lower leg: Edema present.     Comments: 2+ pitting edema of bilateral lower extremities  Skin:    General: Skin is warm and dry.  Neurological:     General: No focal deficit present.     Mental Status: He is alert and oriented to person, place,  and time.     Labs on Admission: I have personally reviewed following labs and imaging studies  CBC: Recent Labs  Lab 07/21/23 1415 07/21/23 1420  WBC 4.3  --   NEUTROABS 3.0  --   HGB 10.4* 11.6*  HCT 34.1* 34.0*  MCV 96.1  --   PLT 132*  --    Basic Metabolic Panel: Recent Labs  Lab 07/21/23 1415 07/21/23 1420  NA 135 138  K 4.5 4.6  CL 101  --   CO2 25  --   GLUCOSE 128*  --   BUN 63*  --   CREATININE 3.18*  --   CALCIUM 8.8*  --   MG 2.9*  --    GFR: Estimated Creatinine Clearance: 24.3 mL/min (A) (by C-G formula based on SCr of 3.18 mg/dL (H)). Liver Function Tests: Recent Labs  Lab 07/21/23 1415  AST 30  ALT 32  ALKPHOS 69  BILITOT 0.8  PROT 7.5  ALBUMIN 3.1*   No results for input(s): "LIPASE", "AMYLASE" in the last 168 hours. No results for input(s): "AMMONIA" in the last 168 hours. Coagulation Profile: No results for input(s): "INR", "PROTIME" in the last 168 hours. Cardiac Enzymes: No results for input(s): "CKTOTAL", "CKMB", "CKMBINDEX", "TROPONINI" in the last 168 hours. BNP (last 3 results) No results for input(s): "PROBNP" in the last 8760 hours. HbA1C: No results for input(s): "HGBA1C" in the last 72 hours. CBG: No results for input(s): "GLUCAP" in the last 168 hours. Lipid Profile: No results for input(s): "CHOL", "HDL", "LDLCALC", "TRIG", "CHOLHDL", "LDLDIRECT" in the last 72 hours. Thyroid Function Tests: No results  for input(s): "TSH", "T4TOTAL", "FREET4", "T3FREE", "THYROIDAB" in the last 72 hours. Anemia Panel: No results for input(s): "VITAMINB12", "FOLATE", "FERRITIN", "TIBC", "IRON", "RETICCTPCT" in the last 72 hours. Urine analysis:    Component Value Date/Time   BILIRUBINUR negative 04/09/2021 0954   PROTEINUR Positive (A) 04/09/2021 0954   UROBILINOGEN 0.2 04/09/2021 0954   NITRITE negative 04/09/2021 0954   LEUKOCYTESUR Negative 04/09/2021 0954    Radiological Exams on Admission: CT CHEST ABDOMEN PELVIS WO CONTRAST  Result Date: 07/21/2023 CLINICAL DATA:  Follow-up aneurysm. EXAM: CT CHEST, ABDOMEN AND PELVIS WITHOUT CONTRAST TECHNIQUE: Multidetector CT imaging of the chest, abdomen and pelvis was performed following the standard protocol without IV contrast. RADIATION DOSE REDUCTION: This exam was performed according to the departmental dose-optimization program which includes automated exposure control, adjustment of the mA and/or kV according to patient size and/or use of iterative reconstruction technique. COMPARISON:  Cardiac CT 01/12/2022 FINDINGS: CT CHEST FINDINGS Cardiovascular: Heart is moderately enlarged. Aorta is normal in size. There is no pericardial effusion. There is mild calcified atherosclerotic disease throughout the aorta. Mediastinum/Nodes: No enlarged mediastinal, hilar, or axillary lymph nodes. Thyroid gland, trachea, and esophagus demonstrate no significant findings. Lungs/Pleura: Small right and moderate left pleural effusions are present. There is compressive atelectasis of the entire left lower lobe. There is compressive atelectasis of the portion of the left upper lobe. There is also atelectasis in the right lower lobe. No evidence for pneumothorax. Musculoskeletal: There is bilateral gynecomastia, right greater than left. No acute fractures are seen. CT ABDOMEN PELVIS FINDINGS Hepatobiliary: No focal liver abnormality is seen. No gallstones, gallbladder wall thickening, or  biliary dilatation. Pancreas: Unremarkable. No pancreatic ductal dilatation or surrounding inflammatory changes. Spleen: Normal in size without focal abnormality. Adrenals/Urinary Tract: Bilateral renal cysts are present measuring up to 2.2 cm. There is no hydronephrosis. There is mild bilateral perinephric fat stranding.  No urinary tract calculi are seen. Adrenal glands and bladder are within normal limits. Stomach/Bowel: Stomach is within normal limits. Appendix appears normal. No evidence of bowel wall thickening, distention, or inflammatory changes. There is sigmoid colon diverticulosis. Vascular/Lymphatic: No significant vascular findings are present. No enlarged abdominal or pelvic lymph nodes. Reproductive: Prostate gland is enlarged. Other: There is mild diffuse body wall edema.  There is no ascites. Musculoskeletal: No fracture is seen. IMPRESSION: 1. No aneurysm identified. 2. Moderate left and small right pleural effusions with compressive atelectasis of the left lower lobe and portion of the left upper lobe. 3. Cardiomegaly. 4. Mild bilateral perinephric fat stranding. Correlate clinically for pyelonephritis. 5. Mild diffuse body wall edema. 6. Bilateral renal cysts. No follow-up imaging recommended. 7. Sigmoid colon diverticulosis. 8. Prostatomegaly. 9. Aortic atherosclerosis. Aortic Atherosclerosis (ICD10-I70.0). Electronically Signed   By: Darliss Cheney M.D.   On: 07/21/2023 19:51   DG Chest Portable 1 View  Result Date: 07/21/2023 CLINICAL DATA:  SOB EXAM: PORTABLE CHEST 1 VIEW COMPARISON:  01/08/2004. FINDINGS: Cardiac silhouette appears prominent and partially obscured by large left-sided pleural effusion. Small right-sided pleural effusion. Pulmonary vascular congestion consistent with pulmonary edema. No pneumothorax. There are thoracic degenerative changes. IMPRESSION: Findings consistent with CHF. Electronically Signed   By: Layla Maw M.D.   On: 07/21/2023 15:51    Assessment and  Plan  Volume overload/bilateral pleural effusions  Acute on chronic HFpEF CT showing moderate left and small right pleural effusions with compressive atelectasis of the left lower lobe and portion of the left upper lobe, cardiomegaly, and mild diffuse body wall edema.  Patient is not hypoxic, he was placed on supplemental oxygen for comfort.  He does appear volume overloaded with peripheral edema.  BNP 183. Last echo done in June 2024 showing EF 60 to 65%, grade 2 diastolic dysfunction, RV systolic function mildly reduced, trivial MVR.  Cardiology managing.  Patient was given IV Lasix 40 mg in the ED.  Monitor intake and output, daily weights.  Low-sodium diet with fluid restriction.  Repeat labs in the morning to check renal function.  Second-degree AV block, Mobitz type I Heart rate in the 30s to 40s with EMS but blood pressure stable.  Patient has been seen by cardiology and EKGs and telemetry reviewed, felt to reflect second-degree type I AV block.  Some blocked PACs but no significant pauses on telemetry.  Cardiology recommending holding Coreg for now.  Will check TSH.  Elevated troponin Cardiology feels this is likely due to demand ischemia. Troponin 337> 278> 309.  Patient denies any pain or pressure in his chest.  AKI on CKD stage IIIb Possibly cardiorenal in etiology.  Creatinine 3.1, previously 2.0-2.4.  Patient was given IV Lasix, repeat labs to check renal function.  Hold home chlorthalidone and losartan.  Hypertension Hold Coreg due to bradycardia.  Hold chlorthalidone and losartan due to AKI.  Hold oral Lasix while patient is getting IV Lasix.  Cardiology recommending continuing amlodipine and hydralazine.  Hematuria Bilateral renal cysts Per ED RN, patient's urine appeared pink in color but no blood clots.  Urine microscopy showing >50 RBCs and CT did show bilateral renal cysts but no urinary tract calculi.  Continue to monitor closely.  Mild bilateral perinephric fat stranding  seen on CT UA not suggestive of infection.  Coronary artery calcification Hyperlipidemia Continue Crestor at reduced dose per pharmacist recommendation.  Hold aspirin at this time given concern for hematuria.  BPH Continue Proscar and Flomax.  Chronic anemia  and thrombocytopenia Stable, continue to monitor CBC.  Gout Hold allopurinol given reduced creatinine clearance.  DVT prophylaxis: SCDs Code Status: Full Code (discussed with the patient) Family Communication: Wife at bedside. Consults called: Cardiology Level of care: Progressive Care Unit Admission status: It is my clinical opinion that referral for OBSERVATION is reasonable and necessary in this patient based on the above information provided. The aforementioned taken together are felt to place the patient at high risk for further clinical deterioration. However, it is anticipated that the patient may be medically stable for discharge from the hospital within 24 to 48 hours.  John Giovanni MD [facility]  If 7PM-7AM, please contact night-coverage [email or web address]  07/21/2023, 9:01 PM

## 2023-07-22 ENCOUNTER — Other Ambulatory Visit (HOSPITAL_COMMUNITY): Payer: Medicare PPO

## 2023-07-22 ENCOUNTER — Inpatient Hospital Stay (HOSPITAL_COMMUNITY): Payer: Medicare PPO

## 2023-07-22 DIAGNOSIS — J9690 Respiratory failure, unspecified, unspecified whether with hypoxia or hypercapnia: Secondary | ICD-10-CM | POA: Diagnosis not present

## 2023-07-22 DIAGNOSIS — N186 End stage renal disease: Secondary | ICD-10-CM | POA: Diagnosis present

## 2023-07-22 DIAGNOSIS — J90 Pleural effusion, not elsewhere classified: Secondary | ICD-10-CM | POA: Diagnosis not present

## 2023-07-22 DIAGNOSIS — N401 Enlarged prostate with lower urinary tract symptoms: Secondary | ICD-10-CM | POA: Diagnosis not present

## 2023-07-22 DIAGNOSIS — R7989 Other specified abnormal findings of blood chemistry: Secondary | ICD-10-CM | POA: Diagnosis not present

## 2023-07-22 DIAGNOSIS — I5031 Acute diastolic (congestive) heart failure: Secondary | ICD-10-CM | POA: Diagnosis not present

## 2023-07-22 DIAGNOSIS — I2489 Other forms of acute ischemic heart disease: Secondary | ICD-10-CM | POA: Diagnosis present

## 2023-07-22 DIAGNOSIS — E871 Hypo-osmolality and hyponatremia: Secondary | ICD-10-CM | POA: Diagnosis not present

## 2023-07-22 DIAGNOSIS — Z992 Dependence on renal dialysis: Secondary | ICD-10-CM | POA: Diagnosis not present

## 2023-07-22 DIAGNOSIS — R310 Gross hematuria: Secondary | ICD-10-CM | POA: Diagnosis not present

## 2023-07-22 DIAGNOSIS — I7121 Aneurysm of the ascending aorta, without rupture: Secondary | ICD-10-CM | POA: Diagnosis present

## 2023-07-22 DIAGNOSIS — Y658 Other specified misadventures during surgical and medical care: Secondary | ICD-10-CM | POA: Diagnosis not present

## 2023-07-22 DIAGNOSIS — I132 Hypertensive heart and chronic kidney disease with heart failure and with stage 5 chronic kidney disease, or end stage renal disease: Secondary | ICD-10-CM | POA: Diagnosis present

## 2023-07-22 DIAGNOSIS — I509 Heart failure, unspecified: Secondary | ICD-10-CM | POA: Diagnosis present

## 2023-07-22 DIAGNOSIS — J9811 Atelectasis: Secondary | ICD-10-CM | POA: Diagnosis present

## 2023-07-22 DIAGNOSIS — I358 Other nonrheumatic aortic valve disorders: Secondary | ICD-10-CM | POA: Diagnosis present

## 2023-07-22 DIAGNOSIS — R0989 Other specified symptoms and signs involving the circulatory and respiratory systems: Secondary | ICD-10-CM | POA: Diagnosis not present

## 2023-07-22 DIAGNOSIS — J918 Pleural effusion in other conditions classified elsewhere: Secondary | ICD-10-CM | POA: Diagnosis present

## 2023-07-22 DIAGNOSIS — I441 Atrioventricular block, second degree: Secondary | ICD-10-CM | POA: Diagnosis not present

## 2023-07-22 DIAGNOSIS — N189 Chronic kidney disease, unspecified: Secondary | ICD-10-CM | POA: Diagnosis not present

## 2023-07-22 DIAGNOSIS — R918 Other nonspecific abnormal finding of lung field: Secondary | ICD-10-CM | POA: Diagnosis not present

## 2023-07-22 DIAGNOSIS — R0609 Other forms of dyspnea: Secondary | ICD-10-CM | POA: Diagnosis not present

## 2023-07-22 DIAGNOSIS — E874 Mixed disorder of acid-base balance: Secondary | ICD-10-CM | POA: Diagnosis not present

## 2023-07-22 DIAGNOSIS — J9601 Acute respiratory failure with hypoxia: Secondary | ICD-10-CM | POA: Diagnosis not present

## 2023-07-22 DIAGNOSIS — T8383XA Hemorrhage of genitourinary prosthetic devices, implants and grafts, initial encounter: Secondary | ICD-10-CM | POA: Diagnosis not present

## 2023-07-22 DIAGNOSIS — I5033 Acute on chronic diastolic (congestive) heart failure: Secondary | ICD-10-CM | POA: Diagnosis present

## 2023-07-22 DIAGNOSIS — R5383 Other fatigue: Secondary | ICD-10-CM | POA: Diagnosis not present

## 2023-07-22 DIAGNOSIS — N184 Chronic kidney disease, stage 4 (severe): Secondary | ICD-10-CM | POA: Diagnosis not present

## 2023-07-22 DIAGNOSIS — J810 Acute pulmonary edema: Secondary | ICD-10-CM | POA: Diagnosis not present

## 2023-07-22 DIAGNOSIS — N281 Cyst of kidney, acquired: Secondary | ICD-10-CM | POA: Insufficient documentation

## 2023-07-22 DIAGNOSIS — R0602 Shortness of breath: Secondary | ICD-10-CM | POA: Diagnosis not present

## 2023-07-22 DIAGNOSIS — N138 Other obstructive and reflux uropathy: Secondary | ICD-10-CM | POA: Diagnosis present

## 2023-07-22 DIAGNOSIS — N179 Acute kidney failure, unspecified: Secondary | ICD-10-CM

## 2023-07-22 DIAGNOSIS — D631 Anemia in chronic kidney disease: Secondary | ICD-10-CM | POA: Diagnosis present

## 2023-07-22 DIAGNOSIS — G9341 Metabolic encephalopathy: Secondary | ICD-10-CM | POA: Diagnosis not present

## 2023-07-22 DIAGNOSIS — E440 Moderate protein-calorie malnutrition: Secondary | ICD-10-CM | POA: Diagnosis not present

## 2023-07-22 DIAGNOSIS — I472 Ventricular tachycardia, unspecified: Secondary | ICD-10-CM | POA: Diagnosis not present

## 2023-07-22 DIAGNOSIS — R319 Hematuria, unspecified: Secondary | ICD-10-CM | POA: Insufficient documentation

## 2023-07-22 DIAGNOSIS — E66812 Obesity, class 2: Secondary | ICD-10-CM | POA: Diagnosis not present

## 2023-07-22 DIAGNOSIS — J9602 Acute respiratory failure with hypercapnia: Secondary | ICD-10-CM | POA: Diagnosis not present

## 2023-07-22 DIAGNOSIS — E8779 Other fluid overload: Secondary | ICD-10-CM | POA: Diagnosis not present

## 2023-07-22 DIAGNOSIS — D62 Acute posthemorrhagic anemia: Secondary | ICD-10-CM | POA: Diagnosis not present

## 2023-07-22 DIAGNOSIS — D6959 Other secondary thrombocytopenia: Secondary | ICD-10-CM | POA: Diagnosis present

## 2023-07-22 DIAGNOSIS — Z6841 Body Mass Index (BMI) 40.0 and over, adult: Secondary | ICD-10-CM | POA: Diagnosis not present

## 2023-07-22 DIAGNOSIS — Z48813 Encounter for surgical aftercare following surgery on the respiratory system: Secondary | ICD-10-CM | POA: Diagnosis not present

## 2023-07-22 DIAGNOSIS — Y738 Miscellaneous gastroenterology and urology devices associated with adverse incidents, not elsewhere classified: Secondary | ICD-10-CM | POA: Diagnosis not present

## 2023-07-22 DIAGNOSIS — I517 Cardiomegaly: Secondary | ICD-10-CM | POA: Diagnosis not present

## 2023-07-22 LAB — BASIC METABOLIC PANEL
Anion gap: 9 (ref 5–15)
BUN: 61 mg/dL — ABNORMAL HIGH (ref 8–23)
CO2: 26 mmol/L (ref 22–32)
Calcium: 8.7 mg/dL — ABNORMAL LOW (ref 8.9–10.3)
Chloride: 101 mmol/L (ref 98–111)
Creatinine, Ser: 3.04 mg/dL — ABNORMAL HIGH (ref 0.61–1.24)
GFR, Estimated: 21 mL/min — ABNORMAL LOW (ref >60)
Glucose, Bld: 111 mg/dL — ABNORMAL HIGH (ref 70–99)
Potassium: 4.6 mmol/L (ref 3.5–5.1)
Sodium: 136 mmol/L (ref 135–145)

## 2023-07-22 LAB — CBC
HCT: 33.3 % — ABNORMAL LOW (ref 39.0–52.0)
Hemoglobin: 9.9 g/dL — ABNORMAL LOW (ref 13.0–17.0)
MCH: 28.5 pg (ref 26.0–34.0)
MCHC: 29.7 g/dL — ABNORMAL LOW (ref 30.0–36.0)
MCV: 96 fL (ref 80.0–100.0)
Platelets: 143 10*3/uL — ABNORMAL LOW (ref 150–400)
RBC: 3.47 MIL/uL — ABNORMAL LOW (ref 4.22–5.81)
RDW: 14.4 % (ref 11.5–15.5)
WBC: 4.6 10*3/uL (ref 4.0–10.5)
nRBC: 0 % (ref 0.0–0.2)

## 2023-07-22 LAB — BLOOD GAS, ARTERIAL
Acid-Base Excess: 3.2 mmol/L — ABNORMAL HIGH (ref 0.0–2.0)
Bicarbonate: 30.9 mmol/L — ABNORMAL HIGH (ref 20.0–28.0)
O2 Saturation: 97.3 %
Patient temperature: 37.2
pCO2 arterial: 60 mm[Hg] — ABNORMAL HIGH (ref 32–48)
pH, Arterial: 7.32 — ABNORMAL LOW (ref 7.35–7.45)
pO2, Arterial: 70 mm[Hg] — ABNORMAL LOW (ref 83–108)

## 2023-07-22 LAB — AMMONIA
Ammonia: 35 umol/L (ref 9–35)
Ammonia: 36 umol/L — ABNORMAL HIGH (ref 9–35)

## 2023-07-22 LAB — TSH: TSH: 2.272 u[IU]/mL (ref 0.350–4.500)

## 2023-07-22 LAB — GLUCOSE, CAPILLARY: Glucose-Capillary: 134 mg/dL — ABNORMAL HIGH (ref 70–99)

## 2023-07-22 MED ORDER — AMLODIPINE BESYLATE 5 MG PO TABS
5.0000 mg | ORAL_TABLET | Freq: Every day | ORAL | Status: DC
  Administered 2023-07-22: 5 mg via ORAL
  Filled 2023-07-22: qty 1

## 2023-07-22 MED ORDER — ENSURE ENLIVE PO LIQD
237.0000 mL | Freq: Two times a day (BID) | ORAL | Status: DC
  Administered 2023-07-22 – 2023-08-17 (×31): 237 mL via ORAL

## 2023-07-22 MED ORDER — HYDRALAZINE HCL 25 MG PO TABS
25.0000 mg | ORAL_TABLET | Freq: Two times a day (BID) | ORAL | Status: DC
  Administered 2023-07-22 (×2): 25 mg via ORAL
  Filled 2023-07-22 (×2): qty 1

## 2023-07-22 MED ORDER — FUROSEMIDE 10 MG/ML IJ SOLN
80.0000 mg | Freq: Two times a day (BID) | INTRAMUSCULAR | Status: DC
  Administered 2023-07-22 – 2023-07-25 (×7): 80 mg via INTRAVENOUS
  Filled 2023-07-22 (×7): qty 8

## 2023-07-22 NOTE — Progress Notes (Signed)
Rounding Note    Patient Name: Andrew Schmitt Date of Encounter: 07/22/2023   [facility] Cardiologist: Chilton Si, MD   Subjective   Different family at bedside this AM. She reports that patient was in his usual state of health until about two weeks ago, when he got his flu shot. Since then they have noticed less energy, more leg swelling, and intermittent shortness of breath. Overnight he desaturated on room air but has done well on nasal cannula this AM. He is sleepy but denies any pain, reports breathing is short but somewhat improved from yesterday.  Inpatient Medications    Scheduled Meds:  amLODipine  5 mg Oral Daily   finasteride  5 mg Oral Daily   furosemide  80 mg Intravenous BID   hydrALAZINE  25 mg Oral BID   rosuvastatin  10 mg Oral Daily   tamsulosin  0.4 mg Oral Daily   Continuous Infusions:  PRN Meds: acetaminophen **OR** acetaminophen   Vital Signs    Vitals:   07/22/23 0345 07/22/23 0406 07/22/23 0630 07/22/23 0900  BP: 134/80  134/64 126/67  Pulse: 81  79 85  Resp: (!) 34  (!) 29 (!) 31  Temp:  97.6 F (36.4 C)    TempSrc:  Oral    SpO2: 98%  98% 96%  Weight:      Height:       No intake or output data in the 24 hours ending 07/22/23 1107    07/21/2023    2:41 PM 07/21/2023    1:58 PM 07/07/2023    8:39 AM  Last 3 Weights  Weight (lbs) 254 lb 254 lb 249 lb  Weight (kg) 115.214 kg 115.214 kg 112.946 kg      Telemetry    SR, intermittent dropped beats. One brief episode of 2:1 AV block. Intermittent PVCs, one 7 beat run of NSVT - Personally Reviewed  Physical Exam   GEN: No acute distress.  Sleepy but answers questions appropriately. [name] in place Neck: JVD low neck sitting upright Cardiac: RRR, no murmurs, rubs, or gallops.  Respiratory: Diminished breath sounds at bilateral bases, left worse than right GI: Soft, nontender, non-distended  MS: Bilateral 2+ LE edema; No deformity. Neuro:  Nonfocal  Psych: Normal affect    New pertinent results (labs, ECG, imaging, cardiac studies)    Echo pending today  Patient Profile     72 y.o. male with a hx of HTN, HLD, CKD III, gout, obesity who is being seenfor the evaluation of bradycardia and heart failure.   Assessment & Plan    AV block, second degree -this is actually Mobitz 1, not mobitz 2, so not high risk -has had some blocked PACs but no significant pauses -has not had significant bradycardia on my review of telemetry here, though reported as HR 30s-40s on EMS arrival -we are holding carvedilol. He has had increased PVCs/brief NSVT overnight. K 4.6, Mg 2.9, not electrolyte related. This may have been suppressed by the beta blocker. Will continue to hold for now, but may need to re-evaluate if ectopy worsens   Volume overload Acute on chronic diastolic heart failure Acute kidney injury on chronic kidney disease stage 4 Elevated troponin, not secondary to ACS -denies chest pain, troponins elevated but flat, in the setting of AKI on CKD and volume overload, this is not ACS -unclear etiology of his volume overload. Did have large pleural effusion previously. This may need to be drained during this admission.  -continue with  IV lasix. I/O not charted -Cr 3.04 today, peak 3.18 on presentation. Was 2.48 on recent labs with Dr. Allyne Gee. Unclear how much urine he is making. If he does not respond to lasix, may need to get nephrology involved -echo pending today   Hypertension -normal to borderline low today -holding carvedilol as above -limited options given CKD -ok to continue amlodipine, hydralazine for now, but if BP drops further would decrease or stop hydralazine first -hold chlorthalidone and oral lasix while getting IV lasxi   Coronary artery calcification Hyperlipidemia -continue rosuvastatin, aspirin    Signed, Jodelle Red, MD  07/22/2023, 11:07 AM

## 2023-07-22 NOTE — Progress Notes (Addendum)
PROGRESS NOTE    Andrew Schmitt  ZOX:[id number] DOB: 18-May-1951 DOA: 07/21/2023 PCP: Dorothyann Peng, MD  [name] with chronic diastolic CHF, hypertension, dyslipidemia, ascending aortic aneurysm, BPH, CKD 3b/4, chronic anemia, gout, obesity presented to the ED with progressive weakness and shortness of breath.  In the ER labs noted hemoglobin of 10.4, platelets 132, creatinine 3.1, BNP 182, troponin 337, 278, CT chest abdomen pelvis with moderate left and small right pleural effusions, mild bilateral perinephric stranding, diffuse abdominal wall edema. -Admitted, started on diuretics, cardiology following   Subjective: -Patient seen, resting in bed, feels weak  Assessment and Plan:  Acute on chronic diastolic CHF -Last echo 6/24 with EF 60-65%, moderate LVH, grade 2 DD, mildly reduced RV -Continue IV Lasix today, cards following -Pleural effusion was read as moderate on CT yesterday, repeat x-ray tomorrow if improving will defer thoracentesis -Cut down amlodipine and hydralazine dose -Follow-up repeat echo -GDMT limited by AKI/CKD  Mild encephalopathy -Likely secondary to above, check ammonia level -Hypercarbia from untreated OSA could be contributing, monitor clinically, check ABG   Second-degree AV block, Mobitz type I Bradycardia -Heart rate improving.  Carvedilol on hold, cards following   Elevated troponin History of CAD -Suspected to be from demand ischemia, no symptoms of ACS -Continue aspirin, statin, holding BB   AKI on CKD stage IIIb -Likely cardiorenal, creatinine now 3.0 from baseline of 2-2.5  -Holding losartan -Monitor with diuresis    Bilateral renal cysts Mild bilateral perinephric fat stranding seen on CT UA not suggestive of infection. -CT findings likely from volume overload, no symptoms of pyelonephritis   BPH Continue Proscar and Flomax.   Chronic anemia and thrombocytopenia Stable, continue to monitor CBC.   Gout Hold allopurinol given reduced  creatinine clearance.   DVT prophylaxis: SCDs Code Status: Full Code  Family Communication: Daughter at side Consults : Cardiology   Procedures:   Antimicrobials:    Objective: Vitals:   07/22/23 0345 07/22/23 0406 07/22/23 0630 07/22/23 0900  BP: 134/80  134/64 126/67  Pulse: 81  79 85  Resp: (!) 34  (!) 29 (!) 31  Temp:  97.6 F (36.4 C)    TempSrc:  Oral    SpO2: 98%  98% 96%  Weight:      Height:       No intake or output data in the 24 hours ending 07/22/23 1056 Filed Weights   07/21/23 1358 07/21/23 1441  Weight: 115.2 kg 115.2 kg    Examination:  General exam: Obese chronically ill male laying in bed, AAO x 2, slightly somnolent but answers questions appropriately HEENT: Positive JVD CVS: S1-S2, regular rhythm Lungs: Basilar Rales noted Abdomen: Firm, mildly distended, abdominal wall edema Extremities: 2+ edema lower legs Neuro: Moves all extremities, no localizing signs,, positive asterixis  Data Reviewed:   CBC: Recent Labs  Lab 07/21/23 1415 07/21/23 1420 07/22/23 0343  WBC 4.3  --  4.6  NEUTROABS 3.0  --   --   HGB 10.4* 11.6* 9.9*  HCT 34.1* 34.0* 33.3*  MCV 96.1  --  96.0  PLT 132*  --  143*   Basic Metabolic Panel: Recent Labs  Lab 07/21/23 1415 07/21/23 1420 07/22/23 0343  NA 135 138 136  K 4.5 4.6 4.6  CL 101  --  101  CO2 25  --  26  GLUCOSE 128*  --  111*  BUN 63*  --  61*  CREATININE 3.18*  --  3.04*  CALCIUM 8.8*  --  8.7*  MG 2.9*  --   --    GFR: Estimated Creatinine Clearance: 25.4 mL/min (A) (by C-G formula based on SCr of 3.04 mg/dL (H)). Liver Function Tests: Recent Labs  Lab 07/21/23 1415  AST 30  ALT 32  ALKPHOS 69  BILITOT 0.8  PROT 7.5  ALBUMIN 3.1*   No results for input(s): "LIPASE", "AMYLASE" in the last 168 hours. Recent Labs  Lab 07/22/23 0900  AMMONIA 35   Coagulation Profile: No results for input(s): "INR", "PROTIME" in the last 168 hours. Cardiac Enzymes: No results for input(s):  "CKTOTAL", "CKMB", "CKMBINDEX", "TROPONINI" in the last 168 hours. BNP (last 3 results) No results for input(s): "PROBNP" in the last 8760 hours. HbA1C: No results for input(s): "HGBA1C" in the last 72 hours. CBG: No results for input(s): "GLUCAP" in the last 168 hours. Lipid Profile: No results for input(s): "CHOL", "HDL", "LDLCALC", "TRIG", "CHOLHDL", "LDLDIRECT" in the last 72 hours. Thyroid Function Tests: Recent Labs    07/22/23 0343  TSH 2.272   Anemia Panel: No results for input(s): "VITAMINB12", "FOLATE", "FERRITIN", "TIBC", "IRON", "RETICCTPCT" in the last 72 hours. Urine analysis:    Component Value Date/Time   COLORURINE AMBER (A) 07/21/2023 2220   APPEARANCEUR HAZY (A) 07/21/2023 2220   LABSPEC 1.009 07/21/2023 2220   PHURINE 5.0 07/21/2023 2220   GLUCOSEU 150 (A) 07/21/2023 2220   HGBUR MODERATE (A) 07/21/2023 2220   BILIRUBINUR NEGATIVE 07/21/2023 2220   BILIRUBINUR negative 04/09/2021 0954   KETONESUR NEGATIVE 07/21/2023 2220   PROTEINUR 100 (A) 07/21/2023 2220   UROBILINOGEN 0.2 04/09/2021 0954   NITRITE NEGATIVE 07/21/2023 2220   LEUKOCYTESUR NEGATIVE 07/21/2023 2220   Sepsis Labs: @LABRCNTIP (procalcitonin:4,lacticidven:4)  )No results found for this or any previous visit (from the past 240 hour(s)).   Radiology Studies: CT CHEST ABDOMEN PELVIS WO CONTRAST  Result Date: 07/21/2023 CLINICAL DATA:  Follow-up aneurysm. EXAM: CT CHEST, ABDOMEN AND PELVIS WITHOUT CONTRAST TECHNIQUE: Multidetector CT imaging of the chest, abdomen and pelvis was performed following the standard protocol without IV contrast. RADIATION DOSE REDUCTION: This exam was performed according to the departmental dose-optimization program which includes automated exposure control, adjustment of the mA and/or kV according to patient size and/or use of iterative reconstruction technique. COMPARISON:  Cardiac CT 01/12/2022 FINDINGS: CT CHEST FINDINGS Cardiovascular: Heart is moderately enlarged.  Aorta is normal in size. There is no pericardial effusion. There is mild calcified atherosclerotic disease throughout the aorta. Mediastinum/Nodes: No enlarged mediastinal, hilar, or axillary lymph nodes. Thyroid gland, trachea, and esophagus demonstrate no significant findings. Lungs/Pleura: Small right and moderate left pleural effusions are present. There is compressive atelectasis of the entire left lower lobe. There is compressive atelectasis of the portion of the left upper lobe. There is also atelectasis in the right lower lobe. No evidence for pneumothorax. Musculoskeletal: There is bilateral gynecomastia, right greater than left. No acute fractures are seen. CT ABDOMEN PELVIS FINDINGS Hepatobiliary: No focal liver abnormality is seen. No gallstones, gallbladder wall thickening, or biliary dilatation. Pancreas: Unremarkable. No pancreatic ductal dilatation or surrounding inflammatory changes. Spleen: Normal in size without focal abnormality. Adrenals/Urinary Tract: Bilateral renal cysts are present measuring up to 2.2 cm. There is no hydronephrosis. There is mild bilateral perinephric fat stranding. No urinary tract calculi are seen. Adrenal glands and bladder are within normal limits. Stomach/Bowel: Stomach is within normal limits. Appendix appears normal. No evidence of bowel wall thickening, distention, or inflammatory changes. There is sigmoid colon diverticulosis. Vascular/Lymphatic: No significant vascular findings are present. No enlarged  abdominal or pelvic lymph nodes. Reproductive: Prostate gland is enlarged. Other: There is mild diffuse body wall edema.  There is no ascites. Musculoskeletal: No fracture is seen. IMPRESSION: 1. No aneurysm identified. 2. Moderate left and small right pleural effusions with compressive atelectasis of the left lower lobe and portion of the left upper lobe. 3. Cardiomegaly. 4. Mild bilateral perinephric fat stranding. Correlate clinically for pyelonephritis. 5. Mild  diffuse body wall edema. 6. Bilateral renal cysts. No follow-up imaging recommended. 7. Sigmoid colon diverticulosis. 8. Prostatomegaly. 9. Aortic atherosclerosis. Aortic Atherosclerosis (ICD10-I70.0). Electronically Signed   By: Darliss Cheney M.D.   On: 07/21/2023 19:51   DG Chest Portable 1 View  Result Date: 07/21/2023 CLINICAL DATA:  SOB EXAM: PORTABLE CHEST 1 VIEW COMPARISON:  01/08/2004. FINDINGS: Cardiac silhouette appears prominent and partially obscured by large left-sided pleural effusion. Small right-sided pleural effusion. Pulmonary vascular congestion consistent with pulmonary edema. No pneumothorax. There are thoracic degenerative changes. IMPRESSION: Findings consistent with CHF. Electronically Signed   By: Layla Maw M.D.   On: 07/21/2023 15:51     Scheduled Meds:  amLODipine  5 mg Oral Daily   finasteride  5 mg Oral Daily   furosemide  80 mg Intravenous BID   hydrALAZINE  25 mg Oral BID   rosuvastatin  10 mg Oral Daily   tamsulosin  0.4 mg Oral Daily   Continuous Infusions:   LOS: 0 days    Time spent:    Zannie Cove, MD [facility]   07/22/2023, 10:56 AM

## 2023-07-22 NOTE — Progress Notes (Signed)
Heart Failure Navigator Progress Note  Assessed for Heart & Vascular TOC clinic readiness.  Patient last EF 60-65%, No HF TOC per Dr. Jomarie Longs, will follow up with [facility]. .   Navigator will sign off at this time.   Rhae Hammock, BSN, Scientist, clinical (histocompatibility and immunogenetics) Only

## 2023-07-22 NOTE — ED Notes (Signed)
ED TO INPATIENT HANDOFF REPORT  ED Nurse Name and Phone #: [email or web address] [phone]  S Name/Age/Gender Andrew Schmitt 72 y.o. male Room/Bed: 018C/018C  Code Status   Code Status: Full Code  Home/SNF/Other Home Patient oriented to: self, place, time, and situation Is this baseline? Yes   Triage Complete: Triage complete  Chief Complaint Acute CHF (congestive heart failure) (HCC) [I50.9]  Triage Note EMS [place] [facility] [place] [facility] after finding him lethargic and brady.  Patient also complains of sob.  Patient 100RA but placed on NRB for comfort per ems.    Allergies Allergies  Allergen Reactions   Ace Inhibitors Cough    Level of Care/Admitting Diagnosis ED Disposition     ED Disposition  Admit   Condition  --   Comment  Hospital Area: [facility] [[id number]]  Level of Care: Progressive [102]  Admit to Progressive based on following criteria: CARDIOVASCULAR & THORACIC of moderate stability with acute coronary syndrome symptoms/low risk myocardial infarction/hypertensive urgency/arrhythmias/heart failure potentially compromising stability and stable post cardiovascular intervention patients.  May admit patient to [facility] or [facility] if equivalent level of care is available:: Yes  Covid Evaluation: Asymptomatic - no recent exposure (last 10 days) testing not required  Diagnosis: Acute CHF (congestive heart failure) [facility]) [[id number]]  Admitting Physician: John Giovanni [[id number]]  Attending Physician: Zannie Cove [[id number]]  Certification:: I certify this patient will need inpatient services for at least 2 midnights          B Medical/Surgery History Past Medical History:  Diagnosis Date   Ascending aortic aneurysm (HCC) 03/19/2022   BPH (benign prostatic hyperplasia)    CKD (chronic kidney disease) stage 3, GFR 30-59 ml/min (HCC) 04/05/2019   CKD (chronic kidney disease), stage IV (HCC) 04/05/2019   Coronary artery calcification 03/19/2022    Erectile dysfunction    Gout    Gout    Heart murmur    Hyperlipidemia    Hypertension    Obesity    Past Surgical History:  Procedure Laterality Date   CARDIAC SURGERY       A IV Location/Drains/Wounds Patient Lines/Drains/Airways Status     Active Line/Drains/Airways     Name Placement date Placement time Site Days   Peripheral IV 07/21/23 20 G Left;Posterior Hand 07/21/23  1400  Hand  1   Peripheral IV 07/21/23 18 G Right Antecubital 07/21/23  1418  Antecubital  1   External Urinary Catheter 07/22/23  0540  --  less than 1            Intake/Output Last 24 hours No intake or output data in the 24 hours ending 07/22/23 1323  Labs/Imaging Results for orders placed or performed during the hospital encounter of 07/21/23 (from the past 48 hour(s))  Troponin I (High Sensitivity)     Status: Abnormal   Collection Time: 07/21/23  2:15 PM  Result Value Ref Range   Troponin I (High Sensitivity) 337 (HH) <18 ng/L    Comment: CRITICAL RESULT CALLED TO, READ BACK BY AND VERIFIED WITH K. CHAPLIN, EMT AT 1549 12.05.24 D. BLU (NOTE) Elevated high sensitivity troponin I (hsTnI) values and significant  changes across serial measurements may suggest ACS but many other  chronic and acute conditions are known to elevate hsTnI results.  Refer to the "Links" section for chest pain algorithms and additional  guidance. Performed at [facility], [address] [place], [place] [address]   Brain natriuretic peptide  Status: Abnormal   Collection Time: 07/21/23  2:15 PM  Result Value Ref Range   B Natriuretic Peptide 183.1 (H) 0.0 - 100.0 pg/mL    Comment: Performed at [facility], [address] [place], [place] [address]  Comprehensive metabolic panel     Status: Abnormal   Collection Time: 07/21/23  2:15 PM  Result Value Ref Range   Sodium 135 135 - 145 mmol/L   Potassium 4.5 3.5 - 5.1 mmol/L   Chloride 101 98 - 111 mmol/L   CO2 25 22 - 32 mmol/L   Glucose,  Bld 128 (H) 70 - 99 mg/dL    Comment: Glucose reference range applies only to samples taken after fasting for at least 8 hours.   BUN 63 (H) 8 - 23 mg/dL   Creatinine, Ser 1.91 (H) 0.61 - 1.24 mg/dL   Calcium 8.8 (L) 8.9 - 10.3 mg/dL   Total Protein 7.5 6.5 - 8.1 g/dL   Albumin 3.1 (L) 3.5 - 5.0 g/dL   AST 30 15 - 41 U/L   ALT 32 0 - 44 U/L   Alkaline Phosphatase 69 38 - 126 U/L   Total Bilirubin 0.8 <1.2 mg/dL   GFR, Estimated 20 (L) >60 mL/min    Comment: (NOTE) Calculated using the CKD-EPI Creatinine Equation (2021)    Anion gap 9 5 - 15    Comment: Performed at [facility], [address], [place], [place] [address]  CBC with Differential     Status: Abnormal   Collection Time: 07/21/23  2:15 PM  Result Value Ref Range   WBC 4.3 4.0 - 10.5 K/uL   RBC 3.55 (L) 4.22 - 5.81 MIL/uL   Hemoglobin 10.4 (L) 13.0 - 17.0 g/dL   HCT 56.2 (L) 13.0 - 86.5 %   MCV 96.1 80.0 - 100.0 fL   MCH 29.3 26.0 - 34.0 pg   MCHC 30.5 30.0 - 36.0 g/dL   RDW 78.4 69.6 - 29.5 %   Platelets 132 (L) 150 - 400 K/uL    Comment: REPEATED TO VERIFY   nRBC 0.5 (H) 0.0 - 0.2 %   Neutrophils Relative % 70 %   Neutro Abs 3.0 1.7 - 7.7 K/uL   Lymphocytes Relative 19 %   Lymphs Abs 0.8 0.7 - 4.0 K/uL   Monocytes Relative 9 %   Monocytes Absolute 0.4 0.1 - 1.0 K/uL   Eosinophils Relative 2 %   Eosinophils Absolute 0.1 0.0 - 0.5 K/uL   Basophils Relative 0 %   Basophils Absolute 0.0 0.0 - 0.1 K/uL   Immature Granulocytes 0 %   Abs Immature Granulocytes 0.01 0.00 - 0.07 K/uL    Comment: Performed at [facility], [address], [place], [place] [address]  Magnesium     Status: Abnormal   Collection Time: 07/21/23  2:15 PM  Result Value Ref Range   Magnesium 2.9 (H) 1.7 - 2.4 mg/dL    Comment: Performed at [facility], [address] [place], [place] [address]  I-Stat venous blood gas, [facility], [facility], [facility])     Status: Abnormal   Collection Time: 07/21/23  2:20 PM  Result Value Ref Range    pH, Ven 7.369 7.25 - 7.43   pCO2, Ven 45.4 44 - 60 mmHg   pO2, Ven 47 (H) 32 - 45 mmHg   Bicarbonate 26.2 20.0 - 28.0 mmol/L   TCO2 28 22 - 32 mmol/L   O2 Saturation 81 %  Acid-Base Excess 1.0 0.0 - 2.0 mmol/L   Sodium 138 135 - 145 mmol/L   Potassium 4.6 3.5 - 5.1 mmol/L   Calcium, Ion 1.15 1.15 - 1.40 mmol/L   HCT 34.0 (L) 39.0 - 52.0 %   Hemoglobin 11.6 (L) 13.0 - 17.0 g/dL   Sample type VENOUS   Troponin I (High Sensitivity)     Status: Abnormal   Collection Time: 07/21/23  5:26 PM  Result Value Ref Range   Troponin I (High Sensitivity) 278 (HH) <18 ng/L    Comment: CRITICAL VALUE NOTED. VALUE IS CONSISTENT WITH PREVIOUSLY REPORTED/CALLED VALUE (NOTE) Elevated high sensitivity troponin I (hsTnI) values and significant  changes across serial measurements may suggest ACS but many other  chronic and acute conditions are known to elevate hsTnI results.  Refer to the "Links" section for chest pain algorithms and additional  guidance. Performed at [facility], [address], [place], [place] [address]   Troponin I (High Sensitivity)     Status: Abnormal   Collection Time: 07/21/23  7:22 PM  Result Value Ref Range   Troponin I (High Sensitivity) 309 (HH) <18 ng/L    Comment: CRITICAL VALUE NOTED. VALUE IS CONSISTENT WITH PREVIOUSLY REPORTED/CALLED VALUE (NOTE) Elevated high sensitivity troponin I (hsTnI) values and significant  changes across serial measurements may suggest ACS but many other  chronic and acute conditions are known to elevate hsTnI results.  Refer to the "Links" section for chest pain algorithms and additional  guidance. Performed at [facility], [address] [place], [place] [address]   Urinalysis, w/ Reflex to Culture (Infection Suspected) -Urine, Clean Catch     Status: Abnormal   Collection Time: 07/21/23 10:20 PM  Result Value Ref Range   Specimen Source URINE, CLEAN CATCH    Color, Urine AMBER (A) YELLOW    Comment: BIOCHEMICALS  MAY BE AFFECTED BY COLOR   APPearance HAZY (A) CLEAR   Specific Gravity, Urine 1.009 1.005 - 1.030   pH 5.0 5.0 - 8.0   Glucose, UA 150 (A) NEGATIVE mg/dL   Hgb urine dipstick MODERATE (A) NEGATIVE   Bilirubin Urine NEGATIVE NEGATIVE   Ketones, ur NEGATIVE NEGATIVE mg/dL   Protein, ur 098 (A) NEGATIVE mg/dL   Nitrite NEGATIVE NEGATIVE   Leukocytes,Ua NEGATIVE NEGATIVE   RBC / HPF >50 0 - 5 RBC/hpf   WBC, UA 6-10 0 - 5 WBC/hpf    Comment:        Reflex urine culture not performed if WBC <=10, OR if Squamous epithelial cells >5. If Squamous epithelial cells >5 suggest recollection.    Bacteria, UA NONE SEEN NONE SEEN   Squamous Epithelial / HPF 0-5 0 - 5 /HPF    Comment: Performed at [facility], [address] [place], [place] [address]  TSH     Status: None   Collection Time: 07/22/23  3:43 AM  Result Value Ref Range   TSH 2.272 0.350 - 4.500 uIU/mL    Comment: Performed by a 3rd Generation assay with a functional sensitivity of <=0.01 uIU/mL. Performed at [facility], [address], [place], [place] [address]   Basic metabolic panel     Status: Abnormal   Collection Time: 07/22/23  3:43 AM  Result Value Ref Range   Sodium 136 135 - 145 mmol/L   Potassium 4.6 3.5 - 5.1 mmol/L   Chloride 101 98 - 111 mmol/L   CO2 26 22 - 32 mmol/L  Glucose, Bld 111 (H) 70 - 99 mg/dL    Comment: Glucose reference range applies only to samples taken after fasting for at least 8 hours.   BUN 61 (H) 8 - 23 mg/dL   Creatinine, Ser 1.61 (H) 0.61 - 1.24 mg/dL   Calcium 8.7 (L) 8.9 - 10.3 mg/dL   GFR, Estimated 21 (L) >60 mL/min    Comment: (NOTE) Calculated using the CKD-EPI Creatinine Equation (2021)    Anion gap 9 5 - 15    Comment: Performed at [facility], [address] [place], [place] [address]  CBC     Status: Abnormal   Collection Time: 07/22/23  3:43 AM  Result Value Ref Range   WBC 4.6 4.0 - 10.5 K/uL   RBC 3.47 (L) 4.22 - 5.81 MIL/uL   Hemoglobin 9.9  (L) 13.0 - 17.0 g/dL   HCT 54.0 (L) 98.1 - 19.1 %   MCV 96.0 80.0 - 100.0 fL   MCH 28.5 26.0 - 34.0 pg   MCHC 29.7 (L) 30.0 - 36.0 g/dL   RDW 47.8 29.5 - 62.1 %   Platelets 143 (L) 150 - 400 K/uL   nRBC 0.0 0.0 - 0.2 %    Comment: Performed at [facility], [address] [place], [place] [address]  Ammonia     Status: None   Collection Time: 07/22/23  9:00 AM  Result Value Ref Range   Ammonia 35 9 - 35 umol/L    Comment: Performed at [facility], [address], [place], [place] [address]   CT CHEST ABDOMEN PELVIS WO CONTRAST  Result Date: 07/21/2023 CLINICAL DATA:  Follow-up aneurysm. EXAM: CT CHEST, ABDOMEN AND PELVIS WITHOUT CONTRAST TECHNIQUE: Multidetector CT imaging of the chest, abdomen and pelvis was performed following the standard protocol without IV contrast. RADIATION DOSE REDUCTION: This exam was performed according to the departmental dose-optimization program which includes automated exposure control, adjustment of the mA and/or kV according to patient size and/or use of iterative reconstruction technique. COMPARISON:  Cardiac CT 01/12/2022 FINDINGS: CT CHEST FINDINGS Cardiovascular: Heart is moderately enlarged. Aorta is normal in size. There is no pericardial effusion. There is mild calcified atherosclerotic disease throughout the aorta. Mediastinum/Nodes: No enlarged mediastinal, hilar, or axillary lymph nodes. Thyroid gland, trachea, and esophagus demonstrate no significant findings. Lungs/Pleura: Small right and moderate left pleural effusions are present. There is compressive atelectasis of the entire left lower lobe. There is compressive atelectasis of the portion of the left upper lobe. There is also atelectasis in the right lower lobe. No evidence for pneumothorax. Musculoskeletal: There is bilateral gynecomastia, right greater than left. No acute fractures are seen. CT ABDOMEN PELVIS FINDINGS Hepatobiliary: No focal liver abnormality is seen. No gallstones,  gallbladder wall thickening, or biliary dilatation. Pancreas: Unremarkable. No pancreatic ductal dilatation or surrounding inflammatory changes. Spleen: Normal in size without focal abnormality. Adrenals/Urinary Tract: Bilateral renal cysts are present measuring up to 2.2 cm. There is no hydronephrosis. There is mild bilateral perinephric fat stranding. No urinary tract calculi are seen. Adrenal glands and bladder are within normal limits. Stomach/Bowel: Stomach is within normal limits. Appendix appears normal. No evidence of bowel wall thickening, distention, or inflammatory changes. There is sigmoid colon diverticulosis. Vascular/Lymphatic: No significant vascular findings are present. No enlarged abdominal or pelvic lymph nodes. Reproductive: Prostate gland is enlarged. Other: There is mild diffuse body wall edema.  There is no ascites. Musculoskeletal: No fracture is seen. IMPRESSION: 1. No aneurysm identified. 2.  Moderate left and small right pleural effusions with compressive atelectasis of the left lower lobe and portion of the left upper lobe. 3. Cardiomegaly. 4. Mild bilateral perinephric fat stranding. Correlate clinically for pyelonephritis. 5. Mild diffuse body wall edema. 6. Bilateral renal cysts. No follow-up imaging recommended. 7. Sigmoid colon diverticulosis. 8. Prostatomegaly. 9. Aortic atherosclerosis. Aortic Atherosclerosis (ICD10-I70.0). Electronically Signed   By: Darliss Cheney M.D.   On: 07/21/2023 19:51   DG Chest Portable 1 View  Result Date: 07/21/2023 CLINICAL DATA:  SOB EXAM: PORTABLE CHEST 1 VIEW COMPARISON:  01/08/2004. FINDINGS: Cardiac silhouette appears prominent and partially obscured by large left-sided pleural effusion. Small right-sided pleural effusion. Pulmonary vascular congestion consistent with pulmonary edema. No pneumothorax. There are thoracic degenerative changes. IMPRESSION: Findings consistent with CHF. Electronically Signed   By: Layla Maw M.D.   On:  07/21/2023 15:51    Pending Labs Unresulted Labs (From admission, onward)    None       Vitals/Pain Today's Vitals   07/22/23 0406 07/22/23 0630 07/22/23 0900 07/22/23 1300  BP:  134/64 126/67 122/85  Pulse:  79 85 85  Resp:  (!) 29 (!) 31 19  Temp: 97.6 F (36.4 C)   97.8 F (36.6 C)  TempSrc: Oral   Oral  SpO2:  98% 96% 97%  Weight:      Height:      PainSc:        Isolation Precautions No active isolations  Medications Medications  finasteride (PROSCAR) tablet 5 mg (5 mg Oral Given 07/22/23 0951)  tamsulosin (FLOMAX) capsule 0.4 mg (0.4 mg Oral Given 07/22/23 0951)  acetaminophen (TYLENOL) tablet 650 mg (has no administration in time range)    Or  acetaminophen (TYLENOL) suppository 650 mg (has no administration in time range)  rosuvastatin (CRESTOR) tablet 10 mg (10 mg Oral Given 07/22/23 0951)  furosemide (LASIX) injection 80 mg (80 mg Intravenous Given 07/22/23 0855)  amLODipine (NORVASC) tablet 5 mg (5 mg Oral Given 07/22/23 0951)  hydrALAZINE (APRESOLINE) tablet 25 mg (25 mg Oral Given 07/22/23 0951)  ipratropium-albuterol (DUONEB) 0.5-2.5 (3) MG/3ML nebulizer solution 3 mL (3 mLs Nebulization Given 07/21/23 1356)  aspirin chewable tablet 324 mg (324 mg Oral Given 07/21/23 1803)  furosemide (LASIX) injection 40 mg (40 mg Intravenous Given 07/21/23 1846)    Mobility walks with person assist, usually walks with no assistance      Focused Assessments Cardiac Assessment Handoff:  Cardiac Rhythm: Normal sinus rhythm No results found for: "CKTOTAL", "CKMB", "CKMBINDEX", "TROPONINI" No results found for: "DDIMER" Does the Patient currently have chest pain? No   , Pulmonary Assessment Handoff:  Lung sounds: Bilateral Breath Sounds: Expiratory wheezes Nasal Cannula 3.5 L     R Recommendations: See Admitting Provider Note  Report given to:   Additional Notes:

## 2023-07-22 NOTE — Plan of Care (Signed)

## 2023-07-22 NOTE — Progress Notes (Addendum)
Acute metabolic encephalopathy Increased oxygen requirement in the setting of CHF exacerbation and pleural effusion: Patient's daughter at the bedside requesting for BiPAP.  Patient is hemodynamically stable.  O2 sat 96% on 6 L.  Obtaining chest x-ray first.  No requirement for BiPAP at this moment.  Update, evaluated patient at the bedside.  He is somnolent but able to wake him up with sternal rub.  Patient able to open his eye but not completely following command.  Currently maintaining O2 sat 93 to 96% on 6 L oxygen. Chest x-ray showing persistent pleural effusion vascular congestion consistent with CHF and moderate pleural effusion bilateral left greater than the right. -As oxygen recommends getting higher obtaining ABG and if patient retaining pCO2 or hypoxic will start BiPAP.  Checking ammonia level. -Consulting pulmonology for thoracentesis in the a.m.  Of note, patient's daughter at the bedside who is a emergency medicine physician at [employer] with very concerned about patient's mentation decline in the setting of acute hypoxic respiratory failure.Marland Kitchen  Tereasa Coop, MD [facility] 07/22/2023, 9:44 PM     Addendum: ABG showing low pCO2 7.32, elevated pCO2 60, pO2 70 on 6 L oxygen.  - Requesting respiratory care to place the BiPAP in the setting of acute hypoxic and hypercapnic respiratory failure. - Patient's daughter at the bedside will keep an eye on the patient overnight while patient is on BiPAP.   -Continue aspiration precaution. -Will repeat ABG around 6 AM  Tereasa Coop, MD [facility] 07/22/2023, 11:48 PM

## 2023-07-23 ENCOUNTER — Other Ambulatory Visit (HOSPITAL_COMMUNITY): Payer: Medicare PPO

## 2023-07-23 DIAGNOSIS — J9601 Acute respiratory failure with hypoxia: Secondary | ICD-10-CM | POA: Diagnosis not present

## 2023-07-23 DIAGNOSIS — R0602 Shortness of breath: Secondary | ICD-10-CM | POA: Diagnosis not present

## 2023-07-23 DIAGNOSIS — I441 Atrioventricular block, second degree: Secondary | ICD-10-CM | POA: Diagnosis not present

## 2023-07-23 DIAGNOSIS — J9602 Acute respiratory failure with hypercapnia: Secondary | ICD-10-CM | POA: Diagnosis not present

## 2023-07-23 DIAGNOSIS — I5031 Acute diastolic (congestive) heart failure: Secondary | ICD-10-CM | POA: Diagnosis not present

## 2023-07-23 DIAGNOSIS — R0609 Other forms of dyspnea: Secondary | ICD-10-CM

## 2023-07-23 DIAGNOSIS — E8779 Other fluid overload: Secondary | ICD-10-CM | POA: Diagnosis not present

## 2023-07-23 DIAGNOSIS — N179 Acute kidney failure, unspecified: Secondary | ICD-10-CM | POA: Diagnosis not present

## 2023-07-23 LAB — CBC
HCT: 32.2 % — ABNORMAL LOW (ref 39.0–52.0)
HCT: 34.1 % — ABNORMAL LOW (ref 39.0–52.0)
Hemoglobin: 9.5 g/dL — ABNORMAL LOW (ref 13.0–17.0)
Hemoglobin: 10.1 g/dL — ABNORMAL LOW (ref 13.0–17.0)
MCH: 28.8 pg (ref 26.0–34.0)
MCH: 29.1 pg (ref 26.0–34.0)
MCHC: 29.5 g/dL — ABNORMAL LOW (ref 30.0–36.0)
MCHC: 29.6 g/dL — ABNORMAL LOW (ref 30.0–36.0)
MCV: 97.6 fL (ref 80.0–100.0)
MCV: 98.3 fL (ref 80.0–100.0)
Platelets: 113 10*3/uL — ABNORMAL LOW (ref 150–400)
Platelets: 104 10*3/uL — ABNORMAL LOW (ref 150–400)
RBC: 3.3 MIL/uL — ABNORMAL LOW (ref 4.22–5.81)
RBC: 3.47 MIL/uL — ABNORMAL LOW (ref 4.22–5.81)
RDW: 14.3 % (ref 11.5–15.5)
RDW: 14.5 % (ref 11.5–15.5)
WBC: 7.2 10*3/uL (ref 4.0–10.5)
WBC: 9.7 10*3/uL (ref 4.0–10.5)
nRBC: 0.3 % — ABNORMAL HIGH (ref 0.0–0.2)
nRBC: 0.2 % (ref 0.0–0.2)

## 2023-07-23 LAB — URINALYSIS, W/ REFLEX TO CULTURE (INFECTION SUSPECTED)
Bilirubin Urine: NEGATIVE
Glucose, UA: 50 mg/dL — AB
Ketones, ur: NEGATIVE mg/dL
Leukocytes,Ua: NEGATIVE
Nitrite: NEGATIVE
Protein, ur: 30 mg/dL — AB
RBC / HPF: >50 RBC/hpf (ref 0–5)
Specific Gravity, Urine: 1.006 (ref 1.005–1.030)
pH: 5 (ref 5.0–8.0)

## 2023-07-23 LAB — COMPREHENSIVE METABOLIC PANEL
ALT: 23 U/L (ref 0–44)
AST: 20 U/L (ref 15–41)
Albumin: 2.9 g/dL — ABNORMAL LOW (ref 3.5–5.0)
Alkaline Phosphatase: 61 U/L (ref 38–126)
Anion gap: 10 (ref 5–15)
BUN: 61 mg/dL — ABNORMAL HIGH (ref 8–23)
CO2: 29 mmol/L (ref 22–32)
Calcium: 8.8 mg/dL — ABNORMAL LOW (ref 8.9–10.3)
Chloride: 100 mmol/L (ref 98–111)
Creatinine, Ser: 3.41 mg/dL — ABNORMAL HIGH (ref 0.61–1.24)
GFR, Estimated: 18 mL/min — ABNORMAL LOW (ref >60)
Glucose, Bld: 125 mg/dL — ABNORMAL HIGH (ref 70–99)
Potassium: 4.7 mmol/L (ref 3.5–5.1)
Sodium: 139 mmol/L (ref 135–145)
Total Bilirubin: 0.8 mg/dL (ref <1.2)
Total Protein: 7.1 g/dL (ref 6.5–8.1)

## 2023-07-23 LAB — BLOOD GAS, ARTERIAL
Acid-Base Excess: 5.6 mmol/L — ABNORMAL HIGH (ref 0.0–2.0)
Acid-Base Excess: 4.4 mmol/L — ABNORMAL HIGH (ref 0.0–2.0)
Acid-Base Excess: 3.6 mmol/L — ABNORMAL HIGH (ref 0.0–2.0)
Bicarbonate: 34.4 mmol/L — ABNORMAL HIGH (ref 20.0–28.0)
Bicarbonate: 33.2 mmol/L — ABNORMAL HIGH (ref 20.0–28.0)
Bicarbonate: 31.4 mmol/L — ABNORMAL HIGH (ref 20.0–28.0)
Drawn by: 365271
O2 Saturation: 100 %
O2 Saturation: 99.8 %
O2 Saturation: 95.9 %
Patient temperature: 37.2
Patient temperature: 37.1
Patient temperature: 37
pCO2 arterial: 71 mm[Hg] (ref 32–48)
pCO2 arterial: 69 mm[Hg] (ref 32–48)
pCO2 arterial: 61 mm[Hg] — ABNORMAL HIGH (ref 32–48)
pH, Arterial: 7.3 — ABNORMAL LOW (ref 7.35–7.45)
pH, Arterial: 7.29 — ABNORMAL LOW (ref 7.35–7.45)
pH, Arterial: 7.32 — ABNORMAL LOW (ref 7.35–7.45)
pO2, Arterial: 109 mm[Hg] — ABNORMAL HIGH (ref 83–108)
pO2, Arterial: 109 mm[Hg] — ABNORMAL HIGH (ref 83–108)
pO2, Arterial: 67 mm[Hg] — ABNORMAL LOW (ref 83–108)

## 2023-07-23 LAB — ECHOCARDIOGRAM COMPLETE
AR max vel: 2.89 cm2
AV Area VTI: 2.83 cm2
AV Area mean vel: 2.68 cm2
AV Mean grad: 6 mm[Hg]
AV Peak grad: 12.3 mm[Hg]
Ao pk vel: 1.75 m/s
Area-P 1/2: 3.34 cm2
Height: 66 in
S' Lateral: 2.8 cm
Weight: 3897.73 [oz_av]

## 2023-07-23 MED ORDER — CHLORHEXIDINE GLUCONATE CLOTH 2 % EX PADS
6.0000 | MEDICATED_PAD | Freq: Every day | CUTANEOUS | Status: DC
  Administered 2023-07-23 – 2023-08-17 (×25): 6 via TOPICAL

## 2023-07-23 MED ORDER — BISACODYL 10 MG RE SUPP
10.0000 mg | Freq: Once | RECTAL | Status: AC
  Administered 2023-07-23: 10 mg via RECTAL
  Filled 2023-07-23: qty 1

## 2023-07-23 MED ORDER — GUAIFENESIN ER 600 MG PO TB12
600.0000 mg | ORAL_TABLET | Freq: Two times a day (BID) | ORAL | Status: DC
  Administered 2023-07-23 – 2023-08-08 (×30): 600 mg via ORAL
  Filled 2023-07-23 (×33): qty 1

## 2023-07-23 MED ORDER — POLYETHYLENE GLYCOL 3350 17 G PO PACK
17.0000 g | PACK | Freq: Every day | ORAL | Status: DC
  Administered 2023-07-25 – 2023-08-02 (×5): 17 g via ORAL
  Filled 2023-07-23 (×10): qty 1

## 2023-07-23 MED ORDER — HYDRALAZINE HCL 10 MG PO TABS
10.0000 mg | ORAL_TABLET | Freq: Two times a day (BID) | ORAL | Status: DC
  Administered 2023-07-23 – 2023-07-25 (×6): 10 mg via ORAL
  Filled 2023-07-23 (×6): qty 1

## 2023-07-23 MED ORDER — HALOPERIDOL LACTATE 5 MG/ML IJ SOLN: 1.0000 mg | Freq: Once | INTRAMUSCULAR | Status: DC

## 2023-07-23 MED ORDER — ALBUMIN HUMAN 25 % IV SOLN
25.0000 g | Freq: Four times a day (QID) | INTRAVENOUS | Status: AC
  Administered 2023-07-23 – 2023-07-24 (×4): 25 g via INTRAVENOUS
  Filled 2023-07-23 (×4): qty 100

## 2023-07-23 NOTE — Progress Notes (Signed)
Rounding Note    Patient Name: Andrew Schmitt Date of Encounter: 07/23/2023  [facility] Cardiologist: Chilton Si, MD   Subjective  His family member is at the bedside.  She is worried about dialysis  Interval history His acute hypoxic and hypercapnic respiratory failure etiology is unclear.  Symptoms occurred after a flu shot.  We have been diuresing him.  He is diuresing well however his creatinine has increased  Inpatient Medications    Scheduled Meds:  bisacodyl  10 mg Rectal Once   feeding supplement  237 mL Oral BID BM   finasteride  5 mg Oral Daily   furosemide  80 mg Intravenous BID   hydrALAZINE  10 mg Oral BID   polyethylene glycol  17 g Oral Daily   rosuvastatin  10 mg Oral Daily   tamsulosin  0.4 mg Oral Daily   Continuous Infusions:  albumin human     PRN Meds: acetaminophen **OR** acetaminophen   Vital Signs    Vitals:   07/23/23 0035 07/23/23 0312 07/23/23 0626 07/23/23 0735  BP: 133/71  118/64   Pulse: 86 73    Resp: (!) 30 (!) 27  (!) 27  Temp:   98.6 F (37 C)   TempSrc:   Axillary   SpO2: 98% 98%    Weight:   110.5 kg   Height:        Intake/Output Summary (Last 24 hours) at 07/23/2023 0949 Last data filed at 07/23/2023 0604 Gross per 24 hour  Intake 940 ml  Output 3375 ml  Net -2435 ml      07/23/2023    6:26 AM 07/21/2023    2:41 PM 07/21/2023    1:58 PM  Last 3 Weights  Weight (lbs) 243 lb 9.7 oz 254 lb 254 lb  Weight (kg) 110.5 kg 115.214 kg 115.214 kg      Telemetry    SR, intermittent dropped beats.  No significant ectopy- Personally Reviewed  Physical Exam   GEN: No acute distress.  Sleepy but answers questions appropriately. Taylor in place Neck: JVD low neck sitting upright Cardiac: RRR, no murmurs, rubs, or gallops.  Respiratory: Diminished breath sounds at bilateral bases, left worse than right GI: Soft, nontender, non-distended  MS: Bilateral 2+ LE edema; No deformity. Neuro:  Nonfocal  Psych:  Normal affect   New pertinent results (labs, ECG, imaging, cardiac studies)    Echo pending today  CT chest abd/pelvis 1. No aneurysm identified. 2. Moderate left and small right pleural effusions with compressiveatelectasis of the left lower lobe and portion of the left upper lobe. 3. Cardiomegaly. 4. Mild bilateral perinephric fat stranding. Correlate clinically for pyelonephritis. 5. Mild diffuse body wall edema. 6. Bilateral renal cysts. No follow-up imaging recommended. 7. Sigmoid colon diverticulosis. 8. Prostatomegaly. 9. Aortic atherosclerosis.  Patient Profile     72 y.o. male with a hx of HTN, HLD, CKD III, gout, obesity who is being seenfor the evaluation of bradycardia and heart failure.   Assessment & Plan    Acute hypoxic and hypercapnic respiratory failure Acute on chronic diastolic heart failure Acute kidney injury on chronic kidney disease stage 4 Elevated troponin, not secondary to ACS -denies chest pain, troponins elevated but flat, in the setting of AKI on CKD and volume overload, this is not ACS -unclear etiology of his volume overload.  -continue with IV lasix -Creatinine is steadily increasing; 3.4; recommend getting nephrology on board if this continues to rise.  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, so not high risk -has had some blocked PACs but no significant pauses -has not had significant bradycardia on my review of telemetry here, though reported as HR 30s-40s on EMS arrival -we are holding carvedilol -No further workup needed   Hypertension -normal to borderline low today -holding carvedilol as above -limited options given CKD -ok to continue amlodipine, hydralazine for now, but if BP drops further would decrease or stop hydralazine  first -hold chlorthalidone and oral lasix while getting IV lasxi   Coronary artery calcification Hyperlipidemia -continue rosuvastatin, aspirin   Time Spent Directly with Patient:  I have spent a total of 35 minutes with the patient reviewing hospital notes, telemetry, EKGs, labs and examining the patient as well as establishing an assessment and plan that was discussed personally with the patient.       Signed, Maisie Fus, MD  07/23/2023, 9:49 AM

## 2023-07-23 NOTE — Consult Note (Signed)
Reason for Consult: Acute kidney injury on chronic kidney disease stage IV Referring Physician: Zannie Cove, MD [facility])  HPI:  72 year old man with past medical history significant for hypertension, dyslipidemia, benign prostatic hypertrophy, HFpEF, ascending aortic aneurysm, gout, chronic anemia/thrombocytopenia, morbid obesity and underlying chronic kidney disease stage IIIB/IV (baseline creatinine between 2.2-2.5, followed by Dr. Marisue Humble at [facility]).  Brought to the emergency room yesterday with increasing lower extremity swelling, shortness of breath and generalized weakness that was attributed to be from acute exacerbation of congestive heart failure with additional labs showing evidence of acute kidney injury on chronic kidney disease.  Overnight developed acute metabolic encephalopathy with increased oxygen requirements for which she was started on BiPAP in the setting of hypoxic/hypercarbic respiratory failure.  He is on furosemide 80 mg twice daily and urine output charted from overnight is 3.3 L.  Concern raised with the potential of his azotemia affecting his mental status and causing myoclonic jerks with creatinine having gone up from 3.0 yesterday to 3.4 today with flat BUN.  Past Medical History:  Diagnosis Date   Ascending aortic aneurysm (HCC) 03/19/2022   BPH (benign prostatic hyperplasia)    CKD (chronic kidney disease) stage 3, GFR 30-59 ml/min (HCC) 04/05/2019   CKD (chronic kidney disease), stage IV (HCC) 04/05/2019   Coronary artery calcification 03/19/2022   Erectile dysfunction    Gout    Gout    Heart murmur    Hyperlipidemia    Hypertension    Obesity     Past Surgical History:  Procedure Laterality Date   CARDIAC SURGERY      Family History  Problem Relation Age of Onset   Healthy Mother    Healthy Father     Social History:  reports that he has never smoked. He has never used smokeless tobacco. He reports that he does not drink alcohol and does not  use drugs.  Allergies:  Allergies  Allergen Reactions   Ace Inhibitors Cough    Medications: I have reviewed the patient's current medications. Scheduled:  feeding supplement  237 mL Oral BID BM   finasteride  5 mg Oral Daily   furosemide  80 mg Intravenous BID   hydrALAZINE  10 mg Oral BID   polyethylene glycol  17 g Oral Daily   rosuvastatin  10 mg Oral Daily   tamsulosin  0.4 mg Oral Daily   Continuous:  albumin human 25 g (07/23/23 1142)       Latest Ref Rng & Units 07/23/2023    2:29 AM 07/22/2023    3:43 AM 07/21/2023    2:20 PM  BMP  Glucose 70 - 99 mg/dL 409  811    BUN 8 - 23 mg/dL 61  61    Creatinine 9.14 - 1.24 mg/dL 7.82  9.56    Sodium 213 - 145 mmol/L 139  136  138   Potassium 3.5 - 5.1 mmol/L 4.7  4.6  4.6   Chloride 98 - 111 mmol/L 100  101    CO2 22 - 32 mmol/L 29  26    Calcium 8.9 - 10.3 mg/dL 8.8  8.7        Latest Ref Rng & Units 07/23/2023    2:29 AM 07/22/2023    3:43 AM 07/21/2023    2:20 PM  CBC  WBC 4.0 - 10.5 K/uL 7.2  4.6    Hemoglobin 13.0 - 17.0 g/dL 9.5  9.9  08.6   Hematocrit 39.0 - 52.0 % 32.2  33.3  34.0   Platelets 150 - 400 K/uL 113  143       DG CHEST PORT 1 VIEW  Result Date: 07/22/2023 CLINICAL DATA:  Shortness of breath and fatigue EXAM: PORTABLE CHEST 1 VIEW COMPARISON:  07/21/2023 FINDINGS: Cardiac shadow remains enlarged. Central vascular congestion is again identified. Bilateral effusions are noted left considerably greater than right. The overall appearance is similar to that seen on the prior exam. IMPRESSION: Persistent effusions and vascular congestion consistent with CHF. Electronically Signed   By: Alcide Clever M.D.   On: 07/22/2023 21:27   CT CHEST ABDOMEN PELVIS WO CONTRAST  Result Date: 07/21/2023 CLINICAL DATA:  Follow-up aneurysm. EXAM: CT CHEST, ABDOMEN AND PELVIS WITHOUT CONTRAST TECHNIQUE: Multidetector CT imaging of the chest, abdomen and pelvis was performed following the standard protocol without IV  contrast. RADIATION DOSE REDUCTION: This exam was performed according to the departmental dose-optimization program which includes automated exposure control, adjustment of the mA and/or kV according to patient size and/or use of iterative reconstruction technique. COMPARISON:  Cardiac CT 01/12/2022 FINDINGS: CT CHEST FINDINGS Cardiovascular: Heart is moderately enlarged. Aorta is normal in size. There is no pericardial effusion. There is mild calcified atherosclerotic disease throughout the aorta. Mediastinum/Nodes: No enlarged mediastinal, hilar, or axillary lymph nodes. Thyroid gland, trachea, and esophagus demonstrate no significant findings. Lungs/Pleura: Small right and moderate left pleural effusions are present. There is compressive atelectasis of the entire left lower lobe. There is compressive atelectasis of the portion of the left upper lobe. There is also atelectasis in the right lower lobe. No evidence for pneumothorax. Musculoskeletal: There is bilateral gynecomastia, right greater than left. No acute fractures are seen. CT ABDOMEN PELVIS FINDINGS Hepatobiliary: No focal liver abnormality is seen. No gallstones, gallbladder wall thickening, or biliary dilatation. Pancreas: Unremarkable. No pancreatic ductal dilatation or surrounding inflammatory changes. Spleen: Normal in size without focal abnormality. Adrenals/Urinary Tract: Bilateral renal cysts are present measuring up to 2.2 cm. There is no hydronephrosis. There is mild bilateral perinephric fat stranding. No urinary tract calculi are seen. Adrenal glands and bladder are within normal limits. Stomach/Bowel: Stomach is within normal limits. Appendix appears normal. No evidence of bowel wall thickening, distention, or inflammatory changes. There is sigmoid colon diverticulosis. Vascular/Lymphatic: No significant vascular findings are present. No enlarged abdominal or pelvic lymph nodes. Reproductive: Prostate gland is enlarged. Other: There is mild  diffuse body wall edema.  There is no ascites. Musculoskeletal: No fracture is seen. IMPRESSION: 1. No aneurysm identified. 2. Moderate left and small right pleural effusions with compressive atelectasis of the left lower lobe and portion of the left upper lobe. 3. Cardiomegaly. 4. Mild bilateral perinephric fat stranding. Correlate clinically for pyelonephritis. 5. Mild diffuse body wall edema. 6. Bilateral renal cysts. No follow-up imaging recommended. 7. Sigmoid colon diverticulosis. 8. Prostatomegaly. 9. Aortic atherosclerosis. Aortic Atherosclerosis (ICD10-I70.0). Electronically Signed   By: Darliss Cheney M.D.   On: 07/21/2023 19:51   DG Chest Portable 1 View  Result Date: 07/21/2023 CLINICAL DATA:  SOB EXAM: PORTABLE CHEST 1 VIEW COMPARISON:  01/08/2004. FINDINGS: Cardiac silhouette appears prominent and partially obscured by large left-sided pleural effusion. Small right-sided pleural effusion. Pulmonary vascular congestion consistent with pulmonary edema. No pneumothorax. There are thoracic degenerative changes. IMPRESSION: Findings consistent with CHF. Electronically Signed   By: Layla Maw M.D.   On: 07/21/2023 15:51    Review of Systems  Unable to perform ROS: Other (On BiPAP, somnolent)   Blood pressure (!) 144/72, pulse 73, temperature  98.6 F (37 C), temperature source Axillary, resp. rate 18, height 5\' 6"  (1.676 m), weight 110.5 kg, SpO2 95%. Physical Exam Vitals reviewed.  Constitutional:      General: He is not in acute distress.    Appearance: He is obese.  HENT:     Head: Normocephalic and atraumatic.  Neck:     Vascular: JVD present.  Cardiovascular:     Rate and Rhythm: Normal rate and regular rhythm.     Heart sounds: No murmur heard. Pulmonary:     Effort: Pulmonary effort is normal.     Breath sounds: Examination of the right-lower field reveals rales. Examination of the left-lower field reveals rales. Rales present.     Comments: On BiPAP Chest:     Chest  wall: No tenderness.  Abdominal:     General: Bowel sounds are normal.     Palpations: Abdomen is soft. There is no mass.     Tenderness: There is no abdominal tenderness. There is no guarding.  Musculoskeletal:     Right lower leg: Edema present.     Left lower leg: Edema present.     Comments: 2+ pitting lower extremity edema  Skin:    General: Skin is warm and dry.  Neurological:     Comments: Somnolent     Assessment/Plan: 1.  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  Agree with volume unloading with diuretics at this time as we follow labs to assess for any modifications of therapy.  Excellent urine output noted overnight with continued diuresis in the bag at bedside.  Based on his current labs showing flat BUN; baseline 30-40, I do not think that his myoclonic jerking is from his azotemia.  No indications for dialysis noted at this time, continue to avoid iodinated intravenous contrast, NSAIDs and morphine/gabapentin and muscle relaxants.  Continue to hold ARB and SGLT2 inhibitor at this time. 2.  Acute exacerbation of congestive heart failure: Excellent response to diuretics (currently on furosemide 80 mg twice daily) overnight.  He is hypervolemic on exam and current dose remains indicated.  Echocardiogram pending. 3.  Encephalopathy: Possibly multifactorial with prominent component associated with his hypercarbia.  Minimal role from azotemia but not unequivocally indicating dialysis. 4.  Anemia: Secondary to chronic disease including chronic kidney disease.  Low iron saturation noted on labs from 3 weeks ago, will order for intravenous iron. 5.  Hypertension: Holding antihypertensive therapy at this time (except tamsulosin for BPH) with ongoing diuresis.   Dagoberto Ligas 07/23/2023, 12:36 PM

## 2023-07-23 NOTE — Consult Note (Signed)
I have been asked to see the patient for urinary retention and gross hematuria   History of present illness:  72 y.o. male with medical history significant of chronic HFpEF, hypertension, hyperlipidemia, ascending aortic aneurysm, BPH, CKD stage IIIb, coronary artery calcification, chronic anemia and thrombocytopenia, gout, class III obesity (BMI 41.0) presented to ED with complaints of generalized weakness and shortness of breath. He was subsequently admitted for acute HF and second degree AV block. The patient was started on heparin and had urinary retention to 650cc and some GH. A catheter was placed and patient was irrigated. Urine was clear. Today his urine is clear.   HE has been seen by Dr. Alvester Morin in the past at [facility] urology for BPH.   Review of systems: A 12 point comprehensive review of systems was obtained and is negative unless otherwise stated in the history of present illness.  Patient Active Problem List   Diagnosis Date Noted   Pleural effusion 07/22/2023   Second degree AV block, Mobitz type I 07/22/2023   Elevated troponin 07/22/2023   Hematuria 07/22/2023   Bilateral renal cysts 07/22/2023   Acute kidney injury superimposed on chronic kidney disease (HCC) 07/22/2023   Acute CHF (congestive heart failure) (HCC) 07/21/2023   Chronic diastolic heart failure (HCC) 07/14/2023   Stage 3b chronic kidney disease (HCC) 07/07/2023   Chronic gout due to renal impairment without tophus 12/08/2022   Coronary artery calcification 03/19/2022   Ascending aortic aneurysm (HCC) 03/19/2022   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   Current Outpatient Medications on File Prior to Encounter  Medication Sig Dispense Refill   acetaminophen (TYLENOL) 500 MG tablet Take 1,000 mg by mouth daily.     allopurinol (ZYLOPRIM) 300 MG tablet TAKE 1 TABLET BY MOUTH EVERY DAY 30 tablet 1   amLODipine (NORVASC) 10 MG tablet TAKE 1 TABLET (10 MG TOTAL) BY MOUTH DAILY. PLEASE CALL OFFICE TO SCHEDULE APPT. 90 tablet 1   aspirin EC 81 MG tablet Take 81 mg by mouth daily.     carvedilol (COREG) 6.25 MG tablet TAKE 1 TABLET BY MOUTH TWICE A DAY WITH FOOD 180 tablet 0   chlorthalidone (HYGROTON) 50 MG tablet Take 50 mg by mouth daily.     docusate sodium (COLACE) 50 MG capsule Take 400 mg by mouth daily.     finasteride (PROSCAR) 5 MG tablet TAKE 1 TABLET BY MOUTH EVERY DAY 90 tablet 1   furosemide (LASIX) 20 MG tablet TAKE 1 TABLET BY MOUTH EVERY DAY 90 tablet 1   Homeopathic Products (SIMILASAN DRY EYE RELIEF) SOLN Apply 2 drops to eye as needed (Dry eyes).     hydrALAZINE (APRESOLINE) 100 MG tablet Take 1 tablet (100 mg total) by mouth 3 (three) times daily. (Patient taking differently: Take 100 mg by mouth 2 (two) times daily.) 270 tablet 2   Iron-Vitamins (S.S.S. TONIC PO) Take 20 mLs by mouth daily.     JARDIANCE 25 MG TABS tablet Take 25 mg by mouth every morning.     losartan (COZAAR) 100 MG tablet Take 100 mg  by mouth at bedtime.     rosuvastatin (CRESTOR) 20 MG tablet Take 1 tablet (20 mg total) by mouth daily. 90 tablet 3   sodium chloride (OCEAN) 0.65 % SOLN nasal spray Place 1 spray into both nostrils 3 (three) times a week.     tamsulosin (FLOMAX) 0.4 MG CAPS capsule TAKE 1 CAPSLE BY MOUTH DAILY 90 capsule 3    Past Medical History:  Diagnosis Date   Ascending aortic aneurysm (HCC) 03/19/2022   BPH (benign prostatic hyperplasia)    CKD (chronic kidney disease) stage 3, GFR 30-59 ml/min (HCC) 04/05/2019   CKD (chronic kidney disease),  stage IV (HCC) 04/05/2019   Coronary artery calcification 03/19/2022   Erectile dysfunction    Gout    Gout    Heart murmur    Hyperlipidemia    Hypertension    Obesity     Past Surgical History:  Procedure Laterality Date   CARDIAC SURGERY      Social History   Tobacco Use   Smoking status: Never   Smokeless tobacco: Never  Vaping Use   Vaping status: Never Used  Substance Use Topics   Alcohol use: No   Drug use: No    Family History  Problem Relation Age of Onset   Healthy Mother    Healthy Father     PE: Vitals:   07/23/23 0626 07/23/23 0735 07/23/23 1026 07/23/23 1310  BP: 118/64  (!) 144/72 (!) 116/59  Pulse:    83  Resp:  (!) 27 18 18   Temp: 98.6 F (37 C)     TempSrc: Axillary     SpO2:   95% 97%  Weight: 110.5 kg     Height:       Patient appears to be in no acute distress  patient is alert and oriented x3 Atraumatic normocephalic head No cervical or supraclavicular lymphadenopathy appreciated No increased work of breathing, no audible wheezes/rhonchi Regular sinus rhythm/rate Abdomen is soft, nontender, nondistended, no CVA or suprapubic tenderness Lower extremities are symmetric without appreciable edema Grossly neurologically intact No identifiable skin lesions  Recent Labs    07/21/23 1415 07/21/23 1420 07/22/23 0343 07/23/23 0229  WBC 4.3  --  4.6 7.2  HGB 10.4* 11.6* 9.9* 9.5*  HCT 34.1* 34.0* 33.3* 32.2*   Recent Labs    07/21/23 1415 07/21/23 1420 07/22/23 0343 07/23/23 0229  NA 135 138 136 139  K 4.5 4.6 4.6 4.7  CL 101  --  101 100  CO2 25  --  26 29  GLUCOSE 128*  --  111* 125*  BUN 63*  --  61* 61*  CREATININE 3.18*  --  3.04* 3.41*  CALCIUM 8.8*  --  8.7* 8.8*   No results for input(s): "LABPT", "INR" in the last 72 hours. No results for input(s): "LABURIN" in the last 72 hours. No results found for this or any previous visit.     Imp: 6 M admitted for HF and AV nodal block urology consult for [facility]. Urine clear  today. No intervention form urology   Recommendations: - start tamsulosin  - continue catheter until discharge day prio to discharge then can VT - will set up f/u at [facility] urology  - urology to sign off     Thank you for involving me in this patient's care. Please page with any further questions or concerns. Adonis Brook

## 2023-07-23 NOTE — Consult Note (Signed)
NAME:  Andrew Schmitt, MRN:  [id number], DOB:  03-01-1951, LOS: 1 ADMISSION DATE:  07/21/2023, CONSULTATION DATE:  07/23/2023 REFERRING MD:  Dr. Jomarie Longs, CHIEF COMPLAINT:  Pulmonary edema    History of Present Illness:  Andrew Schmitt is a 72 year old male with a past medical history significant for diastolic CHF CKD stage IIIa, CAD, HTN, HLD, gout, and obesity who presented to the ED 12/6 for complaints of progressive weakness, shortness of breath, and lower extremity edema.  Workup on arrival consistent with acute exacerbation of CHF.  Patient was admitted per [facility] with cardiology consult.  Overnight 12/6 patient's mentation worsened with more somnolence prompting ABG which revealed hypoxia and hypercapnia.  BiPAP applied.  PCCM consulted this a.m.  Pertinent  Medical History  Diastolic CHF CKD stage IIIa, CAD, HTN, HLD, gout, and obesity   Significant Hospital Events: Including procedures, antibiotic start and stop dates in addition to other pertinent events   12/6 presented with weakness and shortness of breath found to be in acute exasperation CHF with AKI superimposed on CKD stage IIIa 12/7 worsening mentation secondary to hypercapnia in the setting of pulmonary edema, PCCM consulted  Interim History / Subjective:  Seen lying in bed on BiPAP will arouse to verbal stimuli.  Daughter at bedside  Objective   Blood pressure 118/64, pulse 73, temperature 98.6 F (37 C), temperature source Axillary, resp. rate (!) 27, height 5\' 6"  (1.676 m), weight 110.5 kg, SpO2 98%.    FiO2 (%):  [40 %-60 %] 40 %   Intake/Output Summary (Last 24 hours) at 07/23/2023 0803 Last data filed at 07/23/2023 0604 Gross per 24 hour  Intake 940 ml  Output 3375 ml  Net -2435 ml   Filed Weights   07/21/23 1358 07/21/23 1441 07/23/23 0626  Weight: 115.2 kg 115.2 kg 110.5 kg    Examination: General: Acute on chronic ill-appearing elderly male lying in bed on BiPAP in no acute distress HEENT: BiPAP mask  in place, poor seal, MM pink/moist, PERRL,  Neuro: Will briefly arouse to verbal stimuli, appears hypercapnic CV: s1s2 regular rate and rhythm, no murmur, rubs, or gallops,  PULM: Diminished air entry bilaterally, no increased work of breathing, no added breath sounds, tolerating BiPAP GI: soft, bowel sounds active in all 4 quadrants, non-tender, non-distended Extremities: warm/dry, 1+ pitting lower extremity edema  Skin: no rashes or lesions  Resolved Hospital Problem list     Assessment & Plan:  Acute decompensated HFpEF with volume overload -Most recent echocardiogram June 2024 EF 60-65 with moderate LVH, no WMA and grade 2 diastolic dysfunction Second-degree AV block Essential hypertension Hyperlipidemia Elevated troponin with history of CAD P: Cardiology following, appreciate assistance Continuous telemetry  Continue aspirin and statin Strict intake and output  Daily weight to assess volume status Closely monitor renal function and electrolytes  Continue BiPAP as below GDMT as able  Acute hypoxic and hypercapnic respiratory failure Pulmonary edema P: Continue BiPAP Aspiration precautions Low threshold to move to ICU for closer monitoring of airway protection Needs aggressive volume removal, likely heading toward HD given AKI on CKD Consider thoracentesis for effusion  Acute kidney injury superimposed on CKD stage IIIb concern for component of cardiorenal syndrome -Creatinine in November 2024 2.8 with GFR 27, creatinine 3.31 with GFR 18 as of 12/7 P: Currently receiving aggressive diuresing of 80 mg IV Lasix twice daily however output appears minimal Recommend consult with nephrology Follow renal function  Monitor urine output Trend Bmet Avoid nephrotoxins Ensure adequate renal perfusion  Chronic anemia Thrombocytopenia P: Trend CBC Transfuse per protocol Hemoglobin goal greater than 7  Protein calorie malnutrition Hypoalbuminemia P: Unable to safely  consume oral diet currently  Consider NG tube for nutrition however BiPAP therapy places patient at high risk for aspiration   Best Practice (right click and "Reselect all SmartList Selections" daily)   Diet/type: NPO DVT prophylaxis: Place and maintain sequential compression device Start: 07/21/23 2221 Pressure ulcer(s): not present on admission  GI prophylaxis: PPI Lines: N/A Foley:  N/A Code Status:  full code Last date of multidisciplinary goals of care discussion: Pending  Labs   CBC: Recent Labs  Lab 07/21/23 1415 07/21/23 1420 07/22/23 0343 07/23/23 0229  WBC 4.3  --  4.6 7.2  NEUTROABS 3.0  --   --   --   HGB 10.4* 11.6* 9.9* 9.5*  HCT 34.1* 34.0* 33.3* 32.2*  MCV 96.1  --  96.0 97.6  PLT 132*  --  143* 113*    Basic Metabolic Panel: Recent Labs  Lab 07/21/23 1415 07/21/23 1420 07/22/23 0343 07/23/23 0229  NA 135 138 136 139  K 4.5 4.6 4.6 4.7  CL 101  --  101 100  CO2 25  --  26 29  GLUCOSE 128*  --  111* 125*  BUN 63*  --  61* 61*  CREATININE 3.18*  --  3.04* 3.41*  CALCIUM 8.8*  --  8.7* 8.8*  MG 2.9*  --   --   --    GFR: Estimated Creatinine Clearance: 22.2 mL/min (A) (by C-G formula based on SCr of 3.41 mg/dL (H)). Recent Labs  Lab 07/21/23 1415 07/22/23 0343 07/23/23 0229  WBC 4.3 4.6 7.2    Liver Function Tests: Recent Labs  Lab 07/21/23 1415 07/23/23 0229  AST 30 20  ALT 32 23  ALKPHOS 69 61  BILITOT 0.8 0.8  PROT 7.5 7.1  ALBUMIN 3.1* 2.9*   No results for input(s): "LIPASE", "AMYLASE" in the last 168 hours. Recent Labs  Lab 07/22/23 0900 07/22/23 2221  AMMONIA 35 36*    ABG    Component Value Date/Time   PHART 7.29 (L) 07/23/2023 0600   PCO2ART 69 (HH) 07/23/2023 0600   PO2ART 109 (H) 07/23/2023 0600   HCO3 33.2 (H) 07/23/2023 0600   TCO2 28 07/21/2023 1420   O2SAT 99.8 07/23/2023 0600     Coagulation Profile: No results for input(s): "INR", "PROTIME" in the last 168 hours.  Cardiac Enzymes: No results for  input(s): "CKTOTAL", "CKMB", "CKMBINDEX", "TROPONINI" in the last 168 hours.  HbA1C: Hgb A1c MFr Bld  Date/Time Value Ref Range Status  04/09/2021 11:04 AM 5.4 4.8 - 5.6 % Final    Comment:             Prediabetes: 5.7 - 6.4          Diabetes: >6.4          Glycemic control for adults with diabetes: <7.0   04/03/2020 01:27 PM 5.1 4.8 - 5.6 % Final    Comment:             Prediabetes: 5.7 - 6.4          Diabetes: >6.4          Glycemic control for adults with diabetes: <7.0     CBG: Recent Labs  Lab 07/22/23 1513  GLUCAP 134*    Review of Systems:   Unable to assess   Past Medical History:  He,  has a past medical  history of Ascending aortic aneurysm (HCC) (03/19/2022), BPH (benign prostatic hyperplasia), CKD (chronic kidney disease) stage 3, GFR 30-59 ml/min (HCC) (04/05/2019), CKD (chronic kidney disease), stage IV (HCC) (04/05/2019), Coronary artery calcification (03/19/2022), Erectile dysfunction, Gout, Gout, Heart murmur, Hyperlipidemia, Hypertension, and Obesity.   Surgical History:   Past Surgical History:  Procedure Laterality Date   CARDIAC SURGERY       Social History:   reports that he has never smoked. He has never used smokeless tobacco. He reports that he does not drink alcohol and does not use drugs.   Family History:  His family history includes Healthy in his father and mother.   Allergies Allergies  Allergen Reactions   Ace Inhibitors Cough     Home Medications  Prior to Admission medications   Medication Sig Start Date End Date Taking? Authorizing Provider  acetaminophen (TYLENOL) 500 MG tablet Take 1,000 mg by mouth daily.   Yes [provider]  allopurinol (ZYLOPRIM) 300 MG tablet TAKE 1 TABLET BY MOUTH EVERY DAY 07/05/23  Yes Dorothyann Peng, MD  amLODipine (NORVASC) 10 MG tablet TAKE 1 TABLET (10 MG TOTAL) BY MOUTH DAILY. PLEASE CALL OFFICE TO SCHEDULE APPT. 07/01/23  Yes Dorothyann Peng, MD  aspirin EC 81 MG tablet Take 81 mg by mouth  daily.   Yes [provider]  carvedilol (COREG) 6.25 MG tablet TAKE 1 TABLET BY MOUTH TWICE A DAY WITH FOOD 06/01/23  Yes Dorothyann Peng, MD  chlorthalidone (HYGROTON) 50 MG tablet Take 50 mg by mouth daily.   Yes [provider]  docusate sodium (COLACE) 50 MG capsule Take 400 mg by mouth daily.   Yes [provider]  finasteride (PROSCAR) 5 MG tablet TAKE 1 TABLET BY MOUTH EVERY DAY 12/17/22  Yes Dorothyann Peng, MD  furosemide (LASIX) 20 MG tablet TAKE 1 TABLET BY MOUTH EVERY DAY 06/21/23  Yes Dorothyann Peng, MD  Homeopathic Products [facility] DRY EYE RELIEF) SOLN Apply 2 drops to eye as needed (Dry eyes).   Yes [provider]  hydrALAZINE (APRESOLINE) 100 MG tablet Take 1 tablet (100 mg total) by mouth 3 (three) times daily. Patient taking differently: Take 100 mg by mouth 2 (two) times daily. 07/08/23  Yes Dorothyann Peng, MD  Iron-Vitamins (S.S.S. TONIC PO) Take 20 mLs by mouth daily.   Yes [provider]  JARDIANCE 25 MG TABS tablet Take 25 mg by mouth every morning. 03/05/22  Yes [provider]  losartan (COZAAR) 100 MG tablet Take 100 mg by mouth at bedtime. 07/06/23  Yes [provider]  rosuvastatin (CRESTOR) 20 MG tablet Take 1 tablet (20 mg total) by mouth daily. 04/27/22  Yes Chilton Si, MD  sodium chloride (OCEAN) 0.65 % SOLN nasal spray Place 1 spray into both nostrils 3 (three) times a week.   Yes [provider]  tamsulosin (FLOMAX) 0.4 MG CAPS capsule TAKE 1 CAPSLE BY MOUTH DAILY 09/07/22  Yes McKenzie, Mardene Celeste, MD     Critical care time: NA    Dalaysia Harms D. Harris, NP-C [facility] Personal contact information can be found on Amion  If no contact or response made please call [phone] 07/23/2023, 9:28 AM

## 2023-07-23 NOTE — Progress Notes (Signed)
  Patient has low-grade temperature 100.3 F.  Patient's daughter is an emergency medicine physician who is very concerned and requested further workup. -Lab check from morning no evidence of leukocytosis.  Chest x-ray showed pleural effusion and pulmonary vascular congestion no evidence of pneumonia.  -Rechecking CBC, UA with urine culture and blood cultures.  Tereasa Coop, MD [facility] 07/23/2023, 8:34 PM

## 2023-07-23 NOTE — Progress Notes (Signed)
Patient complain pressure in his urinary bladder. Charge nurse did assessed the patient and flushed 20mL in urinary catheter, no resistance, no blood or clots and urine flows freely. Bladder scan was done (0mL). Will continue to monitor.

## 2023-07-23 NOTE — Progress Notes (Signed)
   Notified by RN that telemetry called about prolonged QT in this patient. Obtained EKG, showed sinus rhythm with 1st degree av block (PR interval 286 ms), QT/QTcB 356/435 ms.   No changes to treatment plan at this time. Informed RN that QT normal on EKG   Jonita Albee, PA-C 07/23/2023 2:32 PM

## 2023-07-23 NOTE — Progress Notes (Signed)
  Hypercarbic respiratory failure: ABG from 6 AM showing patient has respiratory acidosis pH 7.29 and pCO2 is 69.  pO2 has been improved to 109 from 70. -Plan to continue BiPAP for now. -Ordered repeat ABG around 11 AM.

## 2023-07-23 NOTE — Progress Notes (Signed)
Writer spoke to Dr. Jomarie Longs and Michael/AC since Amari/RN  reported that the patient's daughter was educated about not getting patient out of bed since the patient is on BiPAP and is unsteady.  Daughter got patient up out of bed regardless.  Amari/RN reported to Clinical research associate that the bed alarm was also turned off though she left it on.  Dr Jomarie Longs stated that she will come speak to the daughter later, and informed me that she does not want the patient out of bed today because of his mentation.  Michael/AC came to the unit to discuss the matter with the Amari/RN the primary RN.

## 2023-07-23 NOTE — Plan of Care (Signed)
  Problem: Education: Goal: Knowledge of General Education information will improve Description: Including pain rating scale, medication(s)/side effects and non-pharmacologic comfort measures Outcome: Progressing   Problem: Clinical Measurements: Goal: Will remain free from infection Outcome: Progressing   Problem: Clinical Measurements: Goal: Diagnostic test results will improve Outcome: Progressing   

## 2023-07-23 NOTE — Progress Notes (Signed)
RT went to get ABG on patient. Currently off BIPAP and according to daughter was taken off around 10am. ABG collected and sent to lab. Patient is on 6 LPM nasal cannula. Talking and joking. No distress noted.

## 2023-07-23 NOTE — Progress Notes (Signed)
PROGRESS NOTE    Andrew Schmitt  NWG:[id number] DOB: November 21, 1950 DOA: 07/21/2023 PCP: Dorothyann Peng, MD  [name] with chronic diastolic CHF, hypertension, dyslipidemia, ascending aortic aneurysm, BPH, CKD 3b/4, chronic anemia, gout, obesity presented to the ED with progressive weakness and shortness of breath.  In the ER labs noted hemoglobin of 10.4, platelets 132, creatinine 3.1, BNP 182, troponin 337, 278, CT chest abdomen pelvis with moderate left and small right pleural effusions, mild bilateral perinephric stranding, diffuse abdominal wall edema. -Admitted,  -12/6: started on diuretics, cardiology following, Coude catheter placed for hematuria w/ CLots and symptoms before overt retention -Overnight more encephalopathic, placed on BiPAP, very mild respiratory acidosis, pCO2 in the 60-70 range   Subjective: -Patient seen, daughter at bedside, resting on BiPAP, we took him off this morning for a short break, slightly encephalopathic but overall responsive and oriented X2, moves all extremities -Daughter is concerned about dehydration  Assessment and Plan:  Acute on chronic diastolic CHF -Last echo 6/24 with EF 60-65%, moderate LVH, grade 2 DD, mildly reduced RV -Diuresed more yesterday, UO of 3.3 L, -2.4 -X-ray overnight with pleural effusions -Continue IV Lasix, discontinue amlodipine, decrease hydralazine dose -Repeat echo is pending -Creatinine slowly worsening, will request nephrology input especially considering some asterixis which may be multifactorial -GDMT limited by AKI/CKD  Mild encephalopathy -Multifactorial, likely from combination of hypercarbia,?  Mild uremia, BUN is 61 only -Ammonia level is normal, continue BiPAP through most of the day today and overnight   Second-degree AV block, Mobitz type I Bradycardia -Heart rate improving.  Carvedilol on hold, cards following   Elevated troponin History of CAD -Suspected to be from demand ischemia, no symptoms of  ACS -Continue aspirin, statin, holding BB   AKI on CKD stage IIIb -Likely cardiorenal, creatinine now 3.4 from baseline of 2-2.5  -Holding losartan -Monitor with diuresis -Foley catheter placed yesterday for hematuria with clots, ? early retention, has known BPH, CT on admission with bilateral renal cysts as well -Some worsening AKI, see discussion above, discussed with nephrology   Bilateral renal cysts Mild bilateral perinephric fat stranding seen on CT UA not suggestive of infection. -CT findings likely from volume overload, no symptoms of pyelonephritis   BPH Continue Proscar and Flomax.   Chronic anemia and thrombocytopenia Stable, monitor   Gout Hold allopurinol given reduced creatinine clearance.   DVT prophylaxis: SCDs Code Status: Full Code  Family Communication: Daughter at side Consults : Cardiology, nephrology   Procedures:   Antimicrobials:    Objective: Vitals:   07/23/23 0312 07/23/23 0626 07/23/23 0735 07/23/23 1026  BP:  118/64  (!) 144/72  Pulse: 73     Resp: (!) 27  (!) 27 18  Temp:  98.6 F (37 C)    TempSrc:  Axillary    SpO2: 98%   95%  Weight:  110.5 kg    Height:        Intake/Output Summary (Last 24 hours) at 07/23/2023 1109 Last data filed at 07/23/2023 5784 Gross per 24 hour  Intake 940 ml  Output 3375 ml  Net -2435 ml   Filed Weights   07/21/23 1358 07/21/23 1441 07/23/23 0626  Weight: 115.2 kg 115.2 kg 110.5 kg    Examination:  General exam: Obese chronically ill male laying in bed, AAO x 2, slightly somnolent but answers questions appropriately HEENT: Positive JVD CVS: S1-S2, regular rhythm Lungs: Basilar Rales noted Abdomen: Firm, mildly distended, abdominal wall edema Extremities: 2+ edema lower legs Neuro: Moves all extremities,  no localizing signs,, positive asterixis  Data Reviewed:   CBC: Recent Labs  Lab 07/21/23 1415 07/21/23 1420 07/22/23 0343 07/23/23 0229  WBC 4.3  --  4.6 7.2  NEUTROABS 3.0  --    --   --   HGB 10.4* 11.6* 9.9* 9.5*  HCT 34.1* 34.0* 33.3* 32.2*  MCV 96.1  --  96.0 97.6  PLT 132*  --  143* 113*   Basic Metabolic Panel: Recent Labs  Lab 07/21/23 1415 07/21/23 1420 07/22/23 0343 07/23/23 0229  NA 135 138 136 139  K 4.5 4.6 4.6 4.7  CL 101  --  101 100  CO2 25  --  26 29  GLUCOSE 128*  --  111* 125*  BUN 63*  --  61* 61*  CREATININE 3.18*  --  3.04* 3.41*  CALCIUM 8.8*  --  8.7* 8.8*  MG 2.9*  --   --   --    GFR: Estimated Creatinine Clearance: 22.2 mL/min (A) (by C-G formula based on SCr of 3.41 mg/dL (H)). Liver Function Tests: Recent Labs  Lab 07/21/23 1415 07/23/23 0229  AST 30 20  ALT 32 23  ALKPHOS 69 61  BILITOT 0.8 0.8  PROT 7.5 7.1  ALBUMIN 3.1* 2.9*   No results for input(s): "LIPASE", "AMYLASE" in the last 168 hours. Recent Labs  Lab 07/22/23 0900 07/22/23 2221  AMMONIA 35 36*   Coagulation Profile: No results for input(s): "INR", "PROTIME" in the last 168 hours. Cardiac Enzymes: No results for input(s): "CKTOTAL", "CKMB", "CKMBINDEX", "TROPONINI" in the last 168 hours. BNP (last 3 results) No results for input(s): "PROBNP" in the last 8760 hours. HbA1C: No results for input(s): "HGBA1C" in the last 72 hours. CBG: Recent Labs  Lab 07/22/23 1513  GLUCAP 134*   Lipid Profile: No results for input(s): "CHOL", "HDL", "LDLCALC", "TRIG", "CHOLHDL", "LDLDIRECT" in the last 72 hours. Thyroid Function Tests: Recent Labs    07/22/23 0343  TSH 2.272   Anemia Panel: No results for input(s): "VITAMINB12", "FOLATE", "FERRITIN", "TIBC", "IRON", "RETICCTPCT" in the last 72 hours. Urine analysis:    Component Value Date/Time   COLORURINE AMBER (A) 07/21/2023 2220   APPEARANCEUR HAZY (A) 07/21/2023 2220   LABSPEC 1.009 07/21/2023 2220   PHURINE 5.0 07/21/2023 2220   GLUCOSEU 150 (A) 07/21/2023 2220   HGBUR MODERATE (A) 07/21/2023 2220   BILIRUBINUR NEGATIVE 07/21/2023 2220   BILIRUBINUR negative 04/09/2021 0954   KETONESUR  NEGATIVE 07/21/2023 2220   PROTEINUR 100 (A) 07/21/2023 2220   UROBILINOGEN 0.2 04/09/2021 0954   NITRITE NEGATIVE 07/21/2023 2220   LEUKOCYTESUR NEGATIVE 07/21/2023 2220   Sepsis Labs: @LABRCNTIP (procalcitonin:4,lacticidven:4)  )No results found for this or any previous visit (from the past 240 hour(s)).   Radiology Studies: DG CHEST PORT 1 VIEW  Result Date: 07/22/2023 CLINICAL DATA:  Shortness of breath and fatigue EXAM: PORTABLE CHEST 1 VIEW COMPARISON:  07/21/2023 FINDINGS: Cardiac shadow remains enlarged. Central vascular congestion is again identified. Bilateral effusions are noted left considerably greater than right. The overall appearance is similar to that seen on the prior exam. IMPRESSION: Persistent effusions and vascular congestion consistent with CHF. Electronically Signed   By: Alcide Clever M.D.   On: 07/22/2023 21:27   CT CHEST ABDOMEN PELVIS WO CONTRAST  Result Date: 07/21/2023 CLINICAL DATA:  Follow-up aneurysm. EXAM: CT CHEST, ABDOMEN AND PELVIS WITHOUT CONTRAST TECHNIQUE: Multidetector CT imaging of the chest, abdomen and pelvis was performed following the standard protocol without IV contrast. RADIATION DOSE REDUCTION: This  exam was performed according to the departmental dose-optimization program which includes automated exposure control, adjustment of the mA and/or kV according to patient size and/or use of iterative reconstruction technique. COMPARISON:  Cardiac CT 01/12/2022 FINDINGS: CT CHEST FINDINGS Cardiovascular: Heart is moderately enlarged. Aorta is normal in size. There is no pericardial effusion. There is mild calcified atherosclerotic disease throughout the aorta. Mediastinum/Nodes: No enlarged mediastinal, hilar, or axillary lymph nodes. Thyroid gland, trachea, and esophagus demonstrate no significant findings. Lungs/Pleura: Small right and moderate left pleural effusions are present. There is compressive atelectasis of the entire left lower lobe. There is  compressive atelectasis of the portion of the left upper lobe. There is also atelectasis in the right lower lobe. No evidence for pneumothorax. Musculoskeletal: There is bilateral gynecomastia, right greater than left. No acute fractures are seen. CT ABDOMEN PELVIS FINDINGS Hepatobiliary: No focal liver abnormality is seen. No gallstones, gallbladder wall thickening, or biliary dilatation. Pancreas: Unremarkable. No pancreatic ductal dilatation or surrounding inflammatory changes. Spleen: Normal in size without focal abnormality. Adrenals/Urinary Tract: Bilateral renal cysts are present measuring up to 2.2 cm. There is no hydronephrosis. There is mild bilateral perinephric fat stranding. No urinary tract calculi are seen. Adrenal glands and bladder are within normal limits. Stomach/Bowel: Stomach is within normal limits. Appendix appears normal. No evidence of bowel wall thickening, distention, or inflammatory changes. There is sigmoid colon diverticulosis. Vascular/Lymphatic: No significant vascular findings are present. No enlarged abdominal or pelvic lymph nodes. Reproductive: Prostate gland is enlarged. Other: There is mild diffuse body wall edema.  There is no ascites. Musculoskeletal: No fracture is seen. IMPRESSION: 1. No aneurysm identified. 2. Moderate left and small right pleural effusions with compressive atelectasis of the left lower lobe and portion of the left upper lobe. 3. Cardiomegaly. 4. Mild bilateral perinephric fat stranding. Correlate clinically for pyelonephritis. 5. Mild diffuse body wall edema. 6. Bilateral renal cysts. No follow-up imaging recommended. 7. Sigmoid colon diverticulosis. 8. Prostatomegaly. 9. Aortic atherosclerosis. Aortic Atherosclerosis (ICD10-I70.0). Electronically Signed   By: Darliss Cheney M.D.   On: 07/21/2023 19:51   DG Chest Portable 1 View  Result Date: 07/21/2023 CLINICAL DATA:  SOB EXAM: PORTABLE CHEST 1 VIEW COMPARISON:  01/08/2004. FINDINGS: Cardiac silhouette  appears prominent and partially obscured by large left-sided pleural effusion. Small right-sided pleural effusion. Pulmonary vascular congestion consistent with pulmonary edema. No pneumothorax. There are thoracic degenerative changes. IMPRESSION: Findings consistent with CHF. Electronically Signed   By: Layla Maw M.D.   On: 07/21/2023 15:51     Scheduled Meds:  feeding supplement  237 mL Oral BID BM   finasteride  5 mg Oral Daily   furosemide  80 mg Intravenous BID   hydrALAZINE  10 mg Oral BID   polyethylene glycol  17 g Oral Daily   rosuvastatin  10 mg Oral Daily   tamsulosin  0.4 mg Oral Daily   Continuous Infusions:  albumin human       LOS: 1 day    Time spent:    Zannie Cove, MD [facility]   07/23/2023, 11:09 AM

## 2023-07-24 DIAGNOSIS — J9602 Acute respiratory failure with hypercapnia: Secondary | ICD-10-CM | POA: Diagnosis not present

## 2023-07-24 DIAGNOSIS — N179 Acute kidney failure, unspecified: Secondary | ICD-10-CM | POA: Diagnosis not present

## 2023-07-24 DIAGNOSIS — E8779 Other fluid overload: Secondary | ICD-10-CM | POA: Diagnosis not present

## 2023-07-24 DIAGNOSIS — J9601 Acute respiratory failure with hypoxia: Secondary | ICD-10-CM | POA: Diagnosis not present

## 2023-07-24 DIAGNOSIS — I5031 Acute diastolic (congestive) heart failure: Secondary | ICD-10-CM | POA: Diagnosis not present

## 2023-07-24 DIAGNOSIS — I441 Atrioventricular block, second degree: Secondary | ICD-10-CM | POA: Diagnosis not present

## 2023-07-24 DIAGNOSIS — R0602 Shortness of breath: Secondary | ICD-10-CM | POA: Diagnosis not present

## 2023-07-24 LAB — COMPREHENSIVE METABOLIC PANEL
ALT: 25 U/L (ref 0–44)
AST: 26 U/L (ref 15–41)
Albumin: 3.6 g/dL (ref 3.5–5.0)
Alkaline Phosphatase: 54 U/L (ref 38–126)
Anion gap: 13 (ref 5–15)
BUN: 68 mg/dL — ABNORMAL HIGH (ref 8–23)
CO2: 28 mmol/L (ref 22–32)
Calcium: 9.1 mg/dL (ref 8.9–10.3)
Chloride: 97 mmol/L — ABNORMAL LOW (ref 98–111)
Creatinine, Ser: 3.65 mg/dL — ABNORMAL HIGH (ref 0.61–1.24)
GFR, Estimated: 17 mL/min — ABNORMAL LOW (ref >60)
Glucose, Bld: 141 mg/dL — ABNORMAL HIGH (ref 70–99)
Potassium: 4.2 mmol/L (ref 3.5–5.1)
Sodium: 138 mmol/L (ref 135–145)
Total Bilirubin: 1.3 mg/dL — ABNORMAL HIGH (ref <1.2)
Total Protein: 7.6 g/dL (ref 6.5–8.1)

## 2023-07-24 LAB — CBC
HCT: 31.6 % — ABNORMAL LOW (ref 39.0–52.0)
Hemoglobin: 9.5 g/dL — ABNORMAL LOW (ref 13.0–17.0)
MCH: 29.3 pg (ref 26.0–34.0)
MCHC: 30.1 g/dL (ref 30.0–36.0)
MCV: 97.5 fL (ref 80.0–100.0)
Platelets: 88 10*3/uL — ABNORMAL LOW (ref 150–400)
RBC: 3.24 MIL/uL — ABNORMAL LOW (ref 4.22–5.81)
RDW: 14.3 % (ref 11.5–15.5)
WBC: 9.1 10*3/uL (ref 4.0–10.5)
nRBC: 0.2 % (ref 0.0–0.2)

## 2023-07-24 LAB — BLOOD GAS, ARTERIAL
Acid-Base Excess: 7 mmol/L — ABNORMAL HIGH (ref 0.0–2.0)
Bicarbonate: 35.3 mmol/L — ABNORMAL HIGH (ref 20.0–28.0)
Drawn by: 283381
O2 Saturation: 99.4 %
Patient temperature: 37.2
pCO2 arterial: 68 mm[Hg] (ref 32–48)
pH, Arterial: 7.33 — ABNORMAL LOW (ref 7.35–7.45)
pO2, Arterial: 85 mm[Hg] (ref 83–108)

## 2023-07-24 LAB — PROCALCITONIN: Procalcitonin: 0.4 ng/mL

## 2023-07-24 MED ORDER — HEPARIN SODIUM (PORCINE) 5000 UNIT/ML IJ SOLN
5000.0000 [IU] | Freq: Three times a day (TID) | INTRAMUSCULAR | Status: DC
Start: 2023-07-24 — End: 2023-08-03
  Administered 2023-07-24 – 2023-08-03 (×30): 5000 [IU] via SUBCUTANEOUS
  Filled 2023-07-24 (×30): qty 1

## 2023-07-24 NOTE — Progress Notes (Signed)
PROGRESS NOTE    Andrew Schmitt  ZOX:[id number] DOB: 1950-12-20 DOA: 07/21/2023 PCP: Dorothyann Peng, MD  [name] with chronic diastolic CHF, hypertension, dyslipidemia, ascending aortic aneurysm, BPH, CKD 3b/4, chronic anemia, gout, obesity presented to the ED with progressive weakness and shortness of breath.  In the ER labs noted hemoglobin of 10.4, platelets 132, creatinine 3.1, BNP 182, troponin 337, 278, CT chest abdomen pelvis with moderate left and small right pleural effusions, mild bilateral perinephric stranding, diffuse abdominal wall edema. -Admitted,  -12/6: started on diuretics, cardiology following, Coude catheter placed for hematuria w/ CLots and symptoms before overt retention -Overnight more encephalopathic, placed on BiPAP, very mild respiratory acidosis, pCO2 in the 60-70 range   Subjective: -Patient seen, daughter at bedside, resting on BiPAP, we took him off this morning for a short break, slightly encephalopathic but overall responsive and oriented X2, moves all extremities -Daughter is concerned about dehydration  Assessment and Plan:  Acute on chronic diastolic CHF -Last echo 6/24 with EF 60-65%, moderate LVH, grade 2 DD, mildly reduced RV -Diuresing on IV Lasix, 5.5 L negative good urine output yesterday -Repeat echo with EF 65-70% moderate LVH, indeterminate diastolic parameters, normal RV -Continue IV Lasix, discontinued amlodipine, on low-dose hydralazine --Creatinine slowly worsening, appreciate nephrology input  -GDMT limited by AKI/CKD  Mild encephalopathy -Multifactorial, likely from combination of hypercarbia,?  Mild uremia, BUN is only 68 -Ammonia level is normal, continue BiPAP intermittently through the day and overnight -Repeat ABG today -Foley placed for urinary retention, hematuria with clots, repeat urinalysis unremarkable, monitor   Second-degree AV block, Mobitz type I Bradycardia -Heart rate improving.  Carvedilol on hold, cards following    Elevated troponin History of CAD -Suspected to be from demand ischemia, no symptoms of ACS -Continue aspirin, statin, holding BB   AKI on CKD stage IIIb -Likely cardiorenal, creatinine now 3.4 from baseline of 2-2.5  -Holding losartan -Monitor with diuresis -Foley catheter placed yesterday for hematuria with clots, ? early retention, has known BPH, CT on admission with bilateral renal cysts as well -Some worsening AKI, see discussion above, discussed with nephrology   Bilateral renal cysts Mild bilateral perinephric fat stranding seen on CT UA not suggestive of infection. -CT findings likely from volume overload, no symptoms of pyelonephritis   BPH Continue Proscar and Flomax.   Chronic anemia and thrombocytopenia Stable, monitor   Gout Hold allopurinol given reduced creatinine clearance.   DVT prophylaxis: Add heparin subcutaneous for DVT prophylaxis Code Status: Full Code  Family Communication: Daughter at bedside Consults : Cardiology, nephrology   Procedures:   Antimicrobials:    Objective: Vitals:   07/24/23 0315 07/24/23 0416 07/24/23 0823 07/24/23 1011  BP:  130/74 118/62 124/64  Pulse: 89 83 85 89  Resp: (!) 23 20 (!) 26   Temp:  100.3 F (37.9 C) 99 F (37.2 C)   TempSrc:  Oral Oral   SpO2: 98% 97% 96% 97%  Weight:      Height:        Intake/Output Summary (Last 24 hours) at 07/24/2023 1142 Last data filed at 07/24/2023 1026 Gross per 24 hour  Intake 903.3 ml  Output 4450 ml  Net -3546.7 ml   Filed Weights   07/21/23 1358 07/21/23 1441 07/23/23 0626  Weight: 115.2 kg 115.2 kg 110.5 kg    Examination:  General exam: Obese chronically ill male laying in bed, AAO x 2 somnolent but arousable, answers some questions appropriately, still with some cognitive deficits HEENT: Positive JVD CVS:  S1-S2, regular rhythm Lungs: Few basilar Rales Abdomen: Soft, nontender, bowel sounds present Extremities: 1-2+ edema with venous stasis changes  Neuro:  Moves all extremities, no localizing signs,, positive asterixis  Data Reviewed:   CBC: Recent Labs  Lab 07/21/23 1415 07/21/23 1420 07/22/23 0343 07/23/23 0229 07/23/23 2113 07/24/23 0259  WBC 4.3  --  4.6 7.2 9.7 9.1  NEUTROABS 3.0  --   --   --   --   --   HGB 10.4* 11.6* 9.9* 9.5* 10.1* 9.5*  HCT 34.1* 34.0* 33.3* 32.2* 34.1* 31.6*  MCV 96.1  --  96.0 97.6 98.3 97.5  PLT 132*  --  143* 113* 104* 88*   Basic Metabolic Panel: Recent Labs  Lab 07/21/23 1415 07/21/23 1420 07/22/23 0343 07/23/23 0229 07/24/23 0259  NA 135 138 136 139 138  K 4.5 4.6 4.6 4.7 4.2  CL 101  --  101 100 97*  CO2 25  --  26 29 28   GLUCOSE 128*  --  111* 125* 141*  BUN 63*  --  61* 61* 68*  CREATININE 3.18*  --  3.04* 3.41* 3.65*  CALCIUM 8.8*  --  8.7* 8.8* 9.1  MG 2.9*  --   --   --   --    GFR: Estimated Creatinine Clearance: 20.7 mL/min (A) (by C-G formula based on SCr of 3.65 mg/dL (H)). Liver Function Tests: Recent Labs  Lab 07/21/23 1415 07/23/23 0229 07/24/23 0259  AST 30 20 26   ALT 32 23 25  ALKPHOS 69 61 54  BILITOT 0.8 0.8 1.3*  PROT 7.5 7.1 7.6  ALBUMIN 3.1* 2.9* 3.6   No results for input(s): "LIPASE", "AMYLASE" in the last 168 hours. Recent Labs  Lab 07/22/23 0900 07/22/23 2221  AMMONIA 35 36*   Coagulation Profile: No results for input(s): "INR", "PROTIME" in the last 168 hours. Cardiac Enzymes: No results for input(s): "CKTOTAL", "CKMB", "CKMBINDEX", "TROPONINI" in the last 168 hours. BNP (last 3 results) No results for input(s): "PROBNP" in the last 8760 hours. HbA1C: No results for input(s): "HGBA1C" in the last 72 hours. CBG: Recent Labs  Lab 07/22/23 1513  GLUCAP 134*   Lipid Profile: No results for input(s): "CHOL", "HDL", "LDLCALC", "TRIG", "CHOLHDL", "LDLDIRECT" in the last 72 hours. Thyroid Function Tests: Recent Labs    07/22/23 0343  TSH 2.272   Anemia Panel: No results for input(s): "VITAMINB12", "FOLATE", "FERRITIN", "TIBC", "IRON",  "RETICCTPCT" in the last 72 hours. Urine analysis:    Component Value Date/Time   COLORURINE STRAW (A) 07/23/2023 2150   APPEARANCEUR CLEAR 07/23/2023 2150   LABSPEC 1.006 07/23/2023 2150   PHURINE 5.0 07/23/2023 2150   GLUCOSEU 50 (A) 07/23/2023 2150   HGBUR MODERATE (A) 07/23/2023 2150   BILIRUBINUR NEGATIVE 07/23/2023 2150   BILIRUBINUR negative 04/09/2021 0954   KETONESUR NEGATIVE 07/23/2023 2150   PROTEINUR 30 (A) 07/23/2023 2150   UROBILINOGEN 0.2 04/09/2021 0954   NITRITE NEGATIVE 07/23/2023 2150   LEUKOCYTESUR NEGATIVE 07/23/2023 2150   Sepsis Labs: @LABRCNTIP (procalcitonin:4,lacticidven:4)  ) Recent Results (from the past 240 hour(s))  Culture, blood (Routine X 2) w Reflex to ID Panel     Status: None (Preliminary result)   Collection Time: 07/23/23  9:12 PM   Specimen: BLOOD RIGHT HAND  Result Value Ref Range Status   Specimen Description BLOOD RIGHT HAND  Final   Special Requests   Final    BOTTLES DRAWN AEROBIC AND ANAEROBIC Blood Culture results may not be optimal due to an  excessive volume of blood received in culture bottles   Culture   Final    NO GROWTH < 12 HOURS Performed at [facility], [address] [place], [place] [address]    Report Status PENDING  Incomplete  Culture, blood (Routine X 2) w Reflex to ID Panel     Status: None (Preliminary result)   Collection Time: 07/23/23  9:12 PM   Specimen: BLOOD  Result Value Ref Range Status   Specimen Description BLOOD LEFT ANTECUBITAL  Final   Special Requests   Final    BOTTLES DRAWN AEROBIC AND ANAEROBIC Blood Culture adequate volume   Culture   Final    NO GROWTH < 12 HOURS Performed at [facility], [address] [place], [place] [address]    Report Status PENDING  Incomplete     Radiology Studies: ECHOCARDIOGRAM COMPLETE  Result Date: 07/23/2023    ECHOCARDIOGRAM REPORT   Patient Name:   CLEAVON HARIS [facility] Date of Exam: 07/23/2023 Medical Rec #:  [id number]         Height:        66.0 in Accession #:    [id number]        Weight:       243.6 lb Date of Birth:  04-Jan-1951          BSA:          2.175 m Patient Age:    72 years          BP:           129/72 mmHg Patient Gender: M                 HR:           76 bpm. Exam Location:  Inpatient Procedure: 2D Echo, Color Doppler and Cardiac Doppler Indications:    Dyspnea  History:        Patient has prior history of Echocardiogram examinations, most                 recent 01/26/2023. CHF, CAD, CKD; Risk Factors:Hypertension and                 Dyslipidemia.  Sonographer:    Milbert Coulter Referring Phys: [id number] Lorrin Bodner IMPRESSIONS  1. Left ventricular ejection fraction, by estimation, is 65 to 70%. The left ventricle has normal function. The left ventricle has no regional wall motion abnormalities. There is moderate left ventricular hypertrophy. Left ventricular diastolic parameters are indeterminate.  2. Right ventricular systolic function is normal. The right ventricular size is normal.  3. Left atrial size was moderately dilated.  4. The mitral valve is abnormal. Trivial mitral valve regurgitation. No evidence of mitral stenosis.  5. The aortic valve is tricuspid. There is mild calcification of the aortic valve. Aortic valve regurgitation is not visualized. Aortic valve sclerosis is present, with no evidence of aortic valve stenosis.  6. The inferior vena cava is normal in size with greater than 50% respiratory variability, suggesting right atrial pressure of 3 mmHg. FINDINGS  Left Ventricle: Left ventricular ejection fraction, by estimation, is 65 to 70%. The left ventricle has normal function. The left ventricle has no regional wall motion abnormalities. The left ventricular internal cavity size was small. There is moderate  left ventricular hypertrophy. Left ventricular diastolic parameters are indeterminate. Right Ventricle: The right ventricular size is normal. No increase in right ventricular wall thickness. Right ventricular systolic  function is normal. Left Atrium:  Left atrial size was moderately dilated. Right Atrium: Right atrial size was normal in size. Pericardium: There is no evidence of pericardial effusion. Mitral Valve: The mitral valve is abnormal. There is mild thickening of the mitral valve leaflet(s). Trivial mitral valve regurgitation. No evidence of mitral valve stenosis. Tricuspid Valve: The tricuspid valve is normal in structure. Tricuspid valve regurgitation is not demonstrated. No evidence of tricuspid stenosis. Aortic Valve: The aortic valve is tricuspid. There is mild calcification of the aortic valve. Aortic valve regurgitation is not visualized. Aortic valve sclerosis is present, with no evidence of aortic valve stenosis. Aortic valve mean gradient measures 6.0 mmHg. Aortic valve peak gradient measures 12.2 mmHg. Aortic valve area, by VTI measures 2.83 cm. Pulmonic Valve: The pulmonic valve was normal in structure. Pulmonic valve regurgitation is mild. No evidence of pulmonic stenosis. Aorta: The aortic root is normal in size and structure. Venous: The inferior vena cava is normal in size with greater than 50% respiratory variability, suggesting right atrial pressure of 3 mmHg. IAS/Shunts: The interatrial septum was not well visualized.  LEFT VENTRICLE PLAX 2D LVIDd:         4.50 cm   Diastology LVIDs:         2.80 cm   LV e' medial:    8.81 cm/s LV PW:         1.40 cm   LV E/e' medial:  14.6 LV IVS:        1.40 cm   LV e' lateral:   7.62 cm/s LVOT diam:     2.10 cm   LV E/e' lateral: 16.9 LV SV:         74 LV SV Index:   34 LVOT Area:     3.46 cm  RIGHT VENTRICLE RV S prime:     13.90 cm/s TAPSE (M-mode): 1.7 cm LEFT ATRIUM             Index        RIGHT ATRIUM           Index LA diam:        4.30 cm 1.98 cm/m   RA Area:     31.00 cm LA Vol (A2C):   76.5 ml 35.18 ml/m  RA Volume:   125.00 ml 57.48 ml/m LA Vol (A4C):   70.2 ml 32.28 ml/m LA Biplane Vol: 73.1 ml 33.61 ml/m  AORTIC VALVE AV Area (Vmax):    2.89 cm  AV Area (Vmean):   2.68 cm AV Area (VTI):     2.83 cm AV Vmax:           175.00 cm/s AV Vmean:          115.000 cm/s AV VTI:            0.262 m AV Peak Grad:      12.2 mmHg AV Mean Grad:      6.0 mmHg LVOT Vmax:         146.00 cm/s LVOT Vmean:        89.100 cm/s LVOT VTI:          0.214 m LVOT/AV VTI ratio: 0.82  AORTA Ao Root diam: 3.60 cm Ao Asc diam:  3.70 cm MITRAL VALVE MV Area (PHT): 3.34 cm     SHUNTS MV Decel Time: 227 msec     Systemic VTI:  0.21 m MV E velocity: 129.00 cm/s  Systemic Diam: 2.10 cm MV A velocity: 107.00 cm/s MV E/A ratio:  1.21 Charlton Haws  MD Electronically signed by Charlton Haws MD Signature Date/Time: 07/23/2023/4:35:16 PM    Final    DG CHEST PORT 1 VIEW  Result Date: 07/22/2023 CLINICAL DATA:  Shortness of breath and fatigue EXAM: PORTABLE CHEST 1 VIEW COMPARISON:  07/21/2023 FINDINGS: Cardiac shadow remains enlarged. Central vascular congestion is again identified. Bilateral effusions are noted left considerably greater than right. The overall appearance is similar to that seen on the prior exam. IMPRESSION: Persistent effusions and vascular congestion consistent with CHF. Electronically Signed   By: Alcide Clever M.D.   On: 07/22/2023 21:27     Scheduled Meds:  Chlorhexidine Gluconate Cloth  6 each Topical Daily   feeding supplement  237 mL Oral BID BM   finasteride  5 mg Oral Daily   furosemide  80 mg Intravenous BID   guaiFENesin  600 mg Oral BID   haloperidol lactate  1 mg Intravenous Once   hydrALAZINE  10 mg Oral BID   polyethylene glycol  17 g Oral Daily   rosuvastatin  10 mg Oral Daily   tamsulosin  0.4 mg Oral Daily   Continuous Infusions:     LOS: 2 days    Time spent:    Zannie Cove, MD [facility]   07/24/2023, 11:42 AM

## 2023-07-24 NOTE — Plan of Care (Signed)
  Problem: Education: Goal: Knowledge of General Education information will improve Description: Including pain rating scale, medication(s)/side effects and non-pharmacologic comfort measures Outcome: Progressing   Problem: Clinical Measurements: Goal: Ability to maintain clinical measurements within normal limits will improve Outcome: Progressing Goal: Will remain free from infection Outcome: Progressing Goal: Diagnostic test results will improve Outcome: Progressing Goal: Respiratory complications will improve Outcome: Progressing Goal: Cardiovascular complication will be avoided Outcome: Progressing   Problem: Activity: Goal: Risk for activity intolerance will decrease Outcome: Progressing   Problem: Nutrition: Goal: Adequate nutrition will be maintained Outcome: Progressing   Problem: Coping: Goal: Level of anxiety will decrease Outcome: Progressing   Problem: Pain Management: Goal: General experience of comfort will improve Outcome: Progressing   Problem: Safety: Goal: Ability to remain free from injury will improve Outcome: Progressing   Problem: Education: Goal: Ability to demonstrate management of disease process will improve Outcome: Progressing Goal: Ability to verbalize understanding of medication therapies will improve Outcome: Progressing Goal: Individualized Educational Video(s) Outcome: Progressing   Problem: Activity: Goal: Capacity to carry out activities will improve Outcome: Progressing   Problem: Cardiac: Goal: Ability to achieve and maintain adequate cardiopulmonary perfusion will improve Outcome: Progressing

## 2023-07-24 NOTE — Progress Notes (Signed)
NAME:  Andrew Schmitt, MRN:  [id number], DOB:  1951/04/23, LOS: 2 ADMISSION DATE:  07/21/2023, CONSULTATION DATE:  07/23/2023 REFERRING MD:  Dr. Jomarie Longs, CHIEF COMPLAINT:  Pulmonary edema    History of Present Illness:  Andrew Schmitt is a 72 year old male with a past medical history significant for diastolic CHF CKD stage IIIa, CAD, HTN, HLD, gout, and obesity who presented to the ED 12/6 for complaints of progressive weakness, shortness of breath, and lower extremity edema.  Workup on arrival consistent with acute exacerbation of CHF.  Patient was admitted per [facility] with cardiology consult.  Overnight 12/6 patient's mentation worsened with more somnolence prompting ABG which revealed hypoxia and hypercapnia.  BiPAP applied.  PCCM consulted this a.m.  Pertinent  Medical History  Diastolic CHF CKD stage IIIa, CAD, HTN, HLD, gout, and obesity   Significant Hospital Events: Including procedures, antibiotic start and stop dates in addition to other pertinent events   12/6 presented with weakness and shortness of breath found to be in acute exasperation CHF with AKI superimposed on CKD stage IIIa 12/7 worsening mentation secondary to hypercapnia in the setting of pulmonary edema, PCCM consulted 12/8 low-grade temp overnight with some hematuria secondary to Foley trauma  Interim History / Subjective:  Patient more alert sitting up in bedside recliner no acute distress.  Daughter at bedside and updated  Objective   Blood pressure 130/74, pulse 83, temperature 100.3 F (37.9 C), temperature source Oral, resp. rate 20, height 5\' 6"  (1.676 m), weight 110.5 kg, SpO2 97%.    FiO2 (%):  [40 %] 40 %   Intake/Output Summary (Last 24 hours) at 07/24/2023 0740 Last data filed at 07/24/2023 1324 Gross per 24 hour  Intake 783.3 ml  Output 3600 ml  Net -2816.7 ml   Filed Weights   07/21/23 1358 07/21/23 1441 07/23/23 0626  Weight: 115.2 kg 115.2 kg 110.5 kg    Examination: General: Acute on  chronic ill-appearing elderly male sitting up in bedside recliner in no acute distress HEENT: [place]/AT, MM pink/moist, PERRL,  Neuro: Alert and oriented, nonfocal CV: s1s2 regular rate and rhythm, no murmur, rubs, or gallops,  PULM: Slightly diminished bilaterally, no increased work of breathing, on room air GI: soft, bowel sounds active in all 4 quadrants, non-tender, non-distended, tolerating  Extremities: warm/dry, nonpitting lower extremity edema  Skin: no rashes or lesions  Resolved Hospital Problem list     Assessment & Plan:  Acute decompensated HFpEF with volume overload -Most recent echocardiogram June 2024 EF 60-65 with moderate LVH, no WMA and grade 2 diastolic dysfunction Second-degree AV block Essential hypertension Hyperlipidemia Elevated troponin with history of CAD P: Primary management per cardiology Continuous telemetry Strict intake and output Daily weight to assess volume status Closely monitor renal function and electrolytes GDMT as able  Acute hypoxic and hypercapnic respiratory failure Pulmonary edema P: Continue nocturnal and as needed BiPAP Aspiration precautions Ongoing volume removal as above Renal function remained stable this a.m., nephrology following No acute indications for thoracentesis at this time  Acute kidney injury superimposed on CKD stage IIIb concern for component of cardiorenal syndrome -Creatinine in November 2024 2.8 with GFR 27, creatinine 3.31 with GFR 18 as of 12/7 P: Nephrology now following, appreciate assistance Follow renal function Strict intake and output Trend electrolytes  Chronic anemia Thrombocytopenia P: Trend CBC Transfuse per protocol Hemoglobin goal greater than 7  Protein calorie malnutrition Hypoalbuminemia P: Encourage oral intake Protein supplementation as able  Best Practice (right click and "Reselect  all SmartList Selections" daily)  Per primary  Critical care time: NA    Cuauhtemoc Huegel D. Harris,  NP-C [facility] Personal contact information can be found on Amion  If no contact or response made please call [phone] 07/24/2023, 7:40 AM

## 2023-07-24 NOTE — Progress Notes (Signed)
Patient ID: Andrew Schmitt, male   DOB: 1951/02/27, 72 y.o.   MRN: [id number] [facility] Progress Note   Assessment/ Plan:   1.  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  He continues to maintain a robust response to diuresis with slight rise of BUN/creatinine noted this morning but without any acute electrolyte abnormality to prompt intervention/changes to management.  We will continue to hold ARB and SGLT2 inhibitor and possibly start weaning furosemide tomorrow.. 2.  Acute exacerbation of congestive heart failure: Excellent response to diuretics (currently on furosemide 80 mg twice daily) overnight.  He is hypervolemic on exam and current dose remains indicated.  Echocardiogram shows HFpEF. 3.  Encephalopathy: Possibly multifactorial with prominent component associated with his hypercarbia.  Minimal role from azotemia but not unequivocally indicating dialysis. 4.  Anemia: Secondary to chronic disease including chronic kidney disease.  Low iron saturation noted on labs from 3 weeks ago, will order for intravenous iron. 5.  Hypertension: Holding antihypertensive therapy at this time (except tamsulosin for BPH) with ongoing diuresis.  Subjective:   Noted to have fever this morning, workup initiated by primary service.   Objective:   BP 118/62 (BP Location: Left Arm)   Pulse 85   Temp 99 F (37.2 C) (Oral)   Resp (!) 26   Ht 5\' 6"  (1.676 m)   Wt 110.5 kg   SpO2 96%   BMI 39.32 kg/m   Intake/Output Summary (Last 24 hours) at 07/24/2023 7829 Last data filed at 07/24/2023 5621 Gross per 24 hour  Intake 903.3 ml  Output 4050 ml  Net -3146.7 ml   Weight change:   Physical Exam: Gen: Appears comfortable sitting up in recliner, somnolent CVS: Pulse regular rhythm, normal rate, S1 and S2 normal Resp: Fine rales over both lung  fields, no wheeze/rhonchi Abd: Soft, obese, nontender, bowel sounds normal Ext: 2+ bilateral pitting lower extremity edema with venous stasis changes  Imaging: ECHOCARDIOGRAM COMPLETE  Result Date: 07/23/2023    ECHOCARDIOGRAM REPORT   Patient Name:   Andrew Schmitt [facility] Date of Exam: 07/23/2023 Medical Rec #:  [id number]         Height:       66.0 in Accession #:    [id number]        Weight:       243.6 lb Date of Birth:  1951-03-29          BSA:          2.175 m Patient Age:    72 years          BP:           129/72 mmHg Patient Gender: M                 HR:           76 bpm. Exam Location:  Inpatient Procedure: 2D Echo, Color Doppler and Cardiac Doppler Indications:    Dyspnea  History:        Patient has prior history of Echocardiogram examinations, most                 recent 01/26/2023. CHF, CAD, CKD; Risk Factors:Hypertension and                 Dyslipidemia.  Sonographer:    Milbert Coulter Referring Phys: [id number] PREETHA JOSEPH IMPRESSIONS  1. Left ventricular  ejection fraction, by estimation, is 65 to 70%. The left ventricle has normal function. The left ventricle has no regional wall motion abnormalities. There is moderate left ventricular hypertrophy. Left ventricular diastolic parameters are indeterminate.  2. Right ventricular systolic function is normal. The right ventricular size is normal.  3. Left atrial size was moderately dilated.  4. The mitral valve is abnormal. Trivial mitral valve regurgitation. No evidence of mitral stenosis.  5. The aortic valve is tricuspid. There is mild calcification of the aortic valve. Aortic valve regurgitation is not visualized. Aortic valve sclerosis is present, with no evidence of aortic valve stenosis.  6. The inferior vena cava is normal in size with greater than 50% respiratory variability, suggesting right atrial pressure of 3 mmHg. FINDINGS  Left Ventricle: Left ventricular ejection fraction, by estimation, is 65 to 70%. The left ventricle has normal function. The  left ventricle has no regional wall motion abnormalities. The left ventricular internal cavity size was small. There is moderate  left ventricular hypertrophy. Left ventricular diastolic parameters are indeterminate. Right Ventricle: The right ventricular size is normal. No increase in right ventricular wall thickness. Right ventricular systolic function is normal. Left Atrium: Left atrial size was moderately dilated. Right Atrium: Right atrial size was normal in size. Pericardium: There is no evidence of pericardial effusion. Mitral Valve: The mitral valve is abnormal. There is mild thickening of the mitral valve leaflet(s). Trivial mitral valve regurgitation. No evidence of mitral valve stenosis. Tricuspid Valve: The tricuspid valve is normal in structure. Tricuspid valve regurgitation is not demonstrated. No evidence of tricuspid stenosis. Aortic Valve: The aortic valve is tricuspid. There is mild calcification of the aortic valve. Aortic valve regurgitation is not visualized. Aortic valve sclerosis is present, with no evidence of aortic valve stenosis. Aortic valve mean gradient measures 6.0 mmHg. Aortic valve peak gradient measures 12.2 mmHg. Aortic valve area, by VTI measures 2.83 cm. Pulmonic Valve: The pulmonic valve was normal in structure. Pulmonic valve regurgitation is mild. No evidence of pulmonic stenosis. Aorta: The aortic root is normal in size and structure. Venous: The inferior vena cava is normal in size with greater than 50% respiratory variability, suggesting right atrial pressure of 3 mmHg. IAS/Shunts: The interatrial septum was not well visualized.  LEFT VENTRICLE PLAX 2D LVIDd:         4.50 cm   Diastology LVIDs:         2.80 cm   LV e' medial:    8.81 cm/s LV PW:         1.40 cm   LV E/e' medial:  14.6 LV IVS:        1.40 cm   LV e' lateral:   7.62 cm/s LVOT diam:     2.10 cm   LV E/e' lateral: 16.9 LV SV:         74 LV SV Index:   34 LVOT Area:     3.46 cm  RIGHT VENTRICLE RV S prime:      13.90 cm/s TAPSE (M-mode): 1.7 cm LEFT ATRIUM             Index        RIGHT ATRIUM           Index LA diam:        4.30 cm 1.98 cm/m   RA Area:     31.00 cm LA Vol (A2C):   76.5 ml 35.18 ml/m  RA Volume:   125.00 ml 57.48 ml/m LA Vol (A4C):  70.2 ml 32.28 ml/m LA Biplane Vol: 73.1 ml 33.61 ml/m  AORTIC VALVE AV Area (Vmax):    2.89 cm AV Area (Vmean):   2.68 cm AV Area (VTI):     2.83 cm AV Vmax:           175.00 cm/s AV Vmean:          115.000 cm/s AV VTI:            0.262 m AV Peak Grad:      12.2 mmHg AV Mean Grad:      6.0 mmHg LVOT Vmax:         146.00 cm/s LVOT Vmean:        89.100 cm/s LVOT VTI:          0.214 m LVOT/AV VTI ratio: 0.82  AORTA Ao Root diam: 3.60 cm Ao Asc diam:  3.70 cm MITRAL VALVE MV Area (PHT): 3.34 cm     SHUNTS MV Decel Time: 227 msec     Systemic VTI:  0.21 m MV E velocity: 129.00 cm/s  Systemic Diam: 2.10 cm MV A velocity: 107.00 cm/s MV E/A ratio:  1.21 Charlton Haws MD Electronically signed by Charlton Haws MD Signature Date/Time: 07/23/2023/4:35:16 PM    Final    DG CHEST PORT 1 VIEW  Result Date: 07/22/2023 CLINICAL DATA:  Shortness of breath and fatigue EXAM: PORTABLE CHEST 1 VIEW COMPARISON:  07/21/2023 FINDINGS: Cardiac shadow remains enlarged. Central vascular congestion is again identified. Bilateral effusions are noted left considerably greater than right. The overall appearance is similar to that seen on the prior exam. IMPRESSION: Persistent effusions and vascular congestion consistent with CHF. Electronically Signed   By: Alcide Clever M.D.   On: 07/22/2023 21:27    Labs: BMET Recent Labs  Lab 07/21/23 1415 07/21/23 1420 07/22/23 0343 07/23/23 0229 07/24/23 0259  NA 135 138 136 139 138  K 4.5 4.6 4.6 4.7 4.2  CL 101  --  101 100 97*  CO2 25  --  26 29 28   GLUCOSE 128*  --  111* 125* 141*  BUN 63*  --  61* 61* 68*  CREATININE 3.18*  --  3.04* 3.41* 3.65*  CALCIUM 8.8*  --  8.7* 8.8* 9.1   CBC Recent Labs  Lab 07/21/23 1415 07/21/23 1420  07/22/23 0343 07/23/23 0229 07/23/23 2113 07/24/23 0259  WBC 4.3  --  4.6 7.2 9.7 9.1  NEUTROABS 3.0  --   --   --   --   --   HGB 10.4*   < > 9.9* 9.5* 10.1* 9.5*  HCT 34.1*   < > 33.3* 32.2* 34.1* 31.6*  MCV 96.1  --  96.0 97.6 98.3 97.5  PLT 132*  --  143* 113* 104* 88*   < > = values in this interval not displayed.    Medications:     Chlorhexidine Gluconate Cloth  6 each Topical Daily   feeding supplement  237 mL Oral BID BM   finasteride  5 mg Oral Daily   furosemide  80 mg Intravenous BID   guaiFENesin  600 mg Oral BID   haloperidol lactate  1 mg Intravenous Once   hydrALAZINE  10 mg Oral BID   polyethylene glycol  17 g Oral Daily   rosuvastatin  10 mg Oral Daily   tamsulosin  0.4 mg Oral Daily   Zetta Bills, MD 07/24/2023, 9:02 AM

## 2023-07-24 NOTE — Progress Notes (Signed)
MEWS Progress Note  Patient Details Name: Andrew Schmitt MRN: [id number] DOB: May 13, 1951 Today's Date: 07/24/2023   MEWS Flowsheet Documentation:  Assess: MEWS Score Temp: (!) 100.5 F (38.1 C) BP: 116/66 MAP (mmHg): 79 Pulse Rate: 80 ECG Heart Rate: 83 Resp: (!) 22 Level of Consciousness: Alert SpO2: 98 % O2 Device: Bi-PAP O2 Flow Rate (L/min): 6 L/min FiO2 (%): 40 % Assess: MEWS Score MEWS Temp: 1 MEWS Systolic: 0 MEWS Pulse: 0 MEWS RR: 1 MEWS LOC: 0 MEWS Score: 2 MEWS Score Color: Yellow Assess: SIRS CRITERIA SIRS Temperature : 0 SIRS Respirations : 1 SIRS Pulse: 0 SIRS WBC: 0 SIRS Score Sum : 1 Assess: if the MEWS score is Yellow or Red Were vital signs accurate and taken at a resting state?: Yes Does the patient meet 2 or more of the SIRS criteria?: No MEWS guidelines implemented : Yes, yellow Treat MEWS Interventions: Considered administering scheduled or prn medications/treatments as ordered Take Vital Signs Increase Vital Sign Frequency : Yellow: Q2hr x1, continue Q4hrs until patient remains green for 12hrs Escalate MEWS: Escalate: Yellow: Discuss with charge nurse and consider notifying provider and/or RRT Notify: Charge Nurse/RN Name of Charge Nurse/RN Notified: Merlyn Albert, RN      Sylvan Lahm Shary Key 07/24/2023, 8:56 PM

## 2023-07-24 NOTE — Progress Notes (Addendum)
12:07 Patient placed back on BIPAP at this time.  1407: Patient taken off BIPAP and placed on nasal cannula. Patient to try to eat lunch.  1444: Patient more alert - almost finished with lunch.  1614: Patient back on BIPAP at this time.

## 2023-07-24 NOTE — Progress Notes (Signed)
Rounding Note    Patient Name: Andrew Schmitt Date of Encounter: 07/24/2023  [facility] Cardiologist: Chilton Si, MD   Subjective  He is sitting in a chair but still resting.  Has had altered mental status but daughter notes has improved.  Not on BiPAP when I saw him  Interval history His acute hypoxic and hypercapnic respiratory failure etiology is unclear.  Symptoms occurred after a flu shot.  We have been diuresing him.  He is diuresing well.  Has had some urinary retention being addressed by urology.  Nephrology was consulted and recommended continuing IV diuresis, no need for dialysis  Inpatient Medications    Scheduled Meds:  Chlorhexidine Gluconate Cloth  6 each Topical Daily   feeding supplement  237 mL Oral BID BM   finasteride  5 mg Oral Daily   furosemide  80 mg Intravenous BID   guaiFENesin  600 mg Oral BID   haloperidol lactate  1 mg Intravenous Once   hydrALAZINE  10 mg Oral BID   polyethylene glycol  17 g Oral Daily   rosuvastatin  10 mg Oral Daily   tamsulosin  0.4 mg Oral Daily   Continuous Infusions:   PRN Meds: acetaminophen **OR** acetaminophen   Vital Signs    Vitals:   07/24/23 0315 07/24/23 0416 07/24/23 0823 07/24/23 1011  BP:  130/74 118/62 124/64  Pulse: 89 83 85 89  Resp: (!) 23 20 (!) 26   Temp:  100.3 F (37.9 C) 99 F (37.2 C)   TempSrc:  Oral Oral   SpO2: 98% 97% 96% 97%  Weight:      Height:        Intake/Output Summary (Last 24 hours) at 07/24/2023 1027 Last data filed at 07/24/2023 1026 Gross per 24 hour  Intake 903.3 ml  Output 4450 ml  Net -3546.7 ml      07/24/2023    4:52 AM 07/23/2023    6:26 AM 07/21/2023    2:41 PM  Last 3 Weights  Weight (lbs) -- 243 lb 9.7 oz 254 lb  Weight (kg) -- 110.5 kg 115.214 kg      Telemetry    Sinus rhythm ,no significant Qtc prolongation, no significant AV block- Personally Reviewed  Physical Exam   GEN: No acute distress.  Resting but has mild wob Neck:  +JVD  Cardiac: RRR, no murmurs, rubs, or gallops.  Respiratory: on [place], Coarse BS BL GI: Soft, nontender, non-distended  MS: Bilateral LE edema; No deformity. Neuro:  Nonfocal  Psych: Normal affect   New pertinent results (labs, ECG, imaging, cardiac studies)    TTE 07/23/2023 1. Left ventricular ejection fraction, by estimation, is 65 to 70%. The  left ventricle has normal function. The left ventricle has no regional  wall motion abnormalities. There is moderate left ventricular hypertrophy.  Left ventricular diastolic  parameters are indeterminate.   2. Right ventricular systolic function is normal. The right ventricular  size is normal.   3. Left atrial size was moderately dilated.   4. The mitral valve is abnormal. Trivial mitral valve regurgitation. No  evidence of mitral stenosis.   5. The aortic valve is tricuspid. There is mild calcification of the  aortic valve. Aortic valve regurgitation is not visualized. Aortic valve  sclerosis is present, with no evidence of aortic valve stenosis.   6. The inferior vena cava is normal in size with greater than 50%  respiratory variability, suggesting right atrial pressure of 3 mmHg.  CT chest abd/pelvis 1. No aneurysm identified. 2. Moderate left and small right pleural effusions with compressiveatelectasis of the left lower lobe and portion of the left upper lobe. 3. Cardiomegaly. 4. Mild bilateral perinephric fat stranding. Correlate clinically for pyelonephritis. 5. Mild diffuse body wall edema. 6. Bilateral renal cysts. No follow-up imaging recommended. 7. Sigmoid colon diverticulosis. 8. Prostatomegaly. 9. Aortic atherosclerosis.  Patient Profile     72 y.o. male with a hx of HTN, HLD, CKD III, gout, obesity who is being seen for the evaluation of bradycardia and heart failure.   Assessment & Plan    Acute hypoxic and hypercapnic respiratory failure Acute on chronic diastolic heart failure Acute kidney injury on  chronic kidney disease stage 3b/4-cardiorenal and urinary obstruction Elevated troponin, not secondary to ACS -denies chest pain, troponins elevated but flat, in the setting of AKI on CKD and volume overload, this is not ACS  - Still has coarse lung sounds can continue IV lasix -Creatinine is steadily increasing; he was found to have urinary obstruction; managed by urology.;   -having good urine output net 2.8 L -Admission weight 254-> 243 -Pulmonology is following and recommended possible thoracentesis for effusion for pleural effusion -Nephrology has been consulted and agree with continued IV diuresis and addressing urinary retention   AV block -has had some blocked PACs but no significant pauses -has not had significant bradycardia on my review of telemetry here, though reported as HR 30s-40s on EMS arrival -we are holding carvedilol -No further workup needed   Hypertension -normal to borderline low today -holding carvedilol as above -limited options given CKD -ok to continue amlodipine, hydralazine  -hold chlorthalidone and oral lasix while getting IV lasxi   Coronary artery calcification Hyperlipidemia -continue rosuvastatin, aspirin   Time Spent Directly with Patient:  I have spent a total of 35 minutes with the patient reviewing hospital notes, telemetry, EKGs, labs and examining the patient as well as establishing an assessment and plan that was discussed personally with the patient.       Signed, Maisie Fus, MD  07/24/2023, 10:27 AM

## 2023-07-25 ENCOUNTER — Inpatient Hospital Stay (HOSPITAL_COMMUNITY): Payer: Medicare PPO

## 2023-07-25 DIAGNOSIS — N179 Acute kidney failure, unspecified: Secondary | ICD-10-CM | POA: Diagnosis not present

## 2023-07-25 DIAGNOSIS — J9601 Acute respiratory failure with hypoxia: Secondary | ICD-10-CM | POA: Diagnosis not present

## 2023-07-25 DIAGNOSIS — R0602 Shortness of breath: Secondary | ICD-10-CM | POA: Diagnosis not present

## 2023-07-25 DIAGNOSIS — E8779 Other fluid overload: Secondary | ICD-10-CM | POA: Diagnosis not present

## 2023-07-25 DIAGNOSIS — J9602 Acute respiratory failure with hypercapnia: Secondary | ICD-10-CM | POA: Diagnosis not present

## 2023-07-25 DIAGNOSIS — I441 Atrioventricular block, second degree: Secondary | ICD-10-CM | POA: Diagnosis not present

## 2023-07-25 DIAGNOSIS — I5031 Acute diastolic (congestive) heart failure: Secondary | ICD-10-CM | POA: Diagnosis not present

## 2023-07-25 LAB — CBC
HCT: 29.8 % — ABNORMAL LOW (ref 39.0–52.0)
Hemoglobin: 8.9 g/dL — ABNORMAL LOW (ref 13.0–17.0)
MCH: 29 pg (ref 26.0–34.0)
MCHC: 29.9 g/dL — ABNORMAL LOW (ref 30.0–36.0)
MCV: 97.1 fL (ref 80.0–100.0)
Platelets: 80 10*3/uL — ABNORMAL LOW (ref 150–400)
RBC: 3.07 MIL/uL — ABNORMAL LOW (ref 4.22–5.81)
RDW: 14.4 % (ref 11.5–15.5)
WBC: 7 10*3/uL (ref 4.0–10.5)
nRBC: 0.3 % — ABNORMAL HIGH (ref 0.0–0.2)

## 2023-07-25 LAB — COMPREHENSIVE METABOLIC PANEL
ALT: 27 U/L (ref 0–44)
AST: 28 U/L (ref 15–41)
Albumin: 3.2 g/dL — ABNORMAL LOW (ref 3.5–5.0)
Alkaline Phosphatase: 47 U/L (ref 38–126)
Anion gap: 11 (ref 5–15)
BUN: 81 mg/dL — ABNORMAL HIGH (ref 8–23)
CO2: 32 mmol/L (ref 22–32)
Calcium: 9 mg/dL (ref 8.9–10.3)
Chloride: 95 mmol/L — ABNORMAL LOW (ref 98–111)
Creatinine, Ser: 4.02 mg/dL — ABNORMAL HIGH (ref 0.61–1.24)
GFR, Estimated: 15 mL/min — ABNORMAL LOW (ref >60)
Glucose, Bld: 116 mg/dL — ABNORMAL HIGH (ref 70–99)
Potassium: 4.3 mmol/L (ref 3.5–5.1)
Sodium: 138 mmol/L (ref 135–145)
Total Bilirubin: 1.3 mg/dL — ABNORMAL HIGH (ref <1.2)
Total Protein: 7.3 g/dL (ref 6.5–8.1)

## 2023-07-25 LAB — URINE CULTURE: Culture: NO GROWTH

## 2023-07-25 LAB — PROCALCITONIN: Procalcitonin: 0.82 ng/mL

## 2023-07-25 LAB — MAGNESIUM: Magnesium: 2.4 mg/dL (ref 1.7–2.4)

## 2023-07-25 MED ORDER — FUROSEMIDE 10 MG/ML IJ SOLN
120.0000 mg | Freq: Two times a day (BID) | INTRAMUSCULAR | Status: DC
  Administered 2023-07-25 – 2023-07-26 (×2): 120 mg via INTRAVENOUS
  Filled 2023-07-25: qty 12
  Filled 2023-07-25 (×2): qty 10

## 2023-07-25 NOTE — Progress Notes (Signed)
Chaplain responded to [facility] consult for Advance Directive. Andrew Schmitt was accompanied by a family member at his bedside. Chaplain introduced spiritual care and offered support during hospitalization. Chaplain identified the purpose for the consult. Andrew Schmitt shared that he had not requested a visit of that sort and he and his family member stated that he did not want information on Advance Directives. His family member stated "He is a full code, so he is good" and pt stated, "they've got it all taken care of." I offered to explain more for the purpose of education, but both declined. They did accept an informational brochure to review on their own time.  Maryanna Shape. Carley Hammed, M.Div. [facility] Chaplain Pager [phone] Office [phone]

## 2023-07-25 NOTE — Plan of Care (Signed)
  Problem: Clinical Measurements: Goal: Respiratory complications will improve Outcome: Progressing   Problem: Activity: Goal: Capacity to carry out activities will improve Outcome: Progressing   Problem: Cardiac: Goal: Ability to achieve and maintain adequate cardiopulmonary perfusion will improve Outcome: Progressing   

## 2023-07-25 NOTE — Care Management Important Message (Signed)
Important Message  Patient Details  Name: Andrew Schmitt MRN: [id number] Date of Birth: 10-01-1950   Important Message Given:  Yes - Medicare IM     Dorena Bodo 07/25/2023, 3:58 PM

## 2023-07-25 NOTE — Progress Notes (Signed)
NAME:  Andrew Schmitt, MRN:  [id number], DOB:  [birth year], LOS: 3 ADMISSION DATE:  07/21/2023, CONSULTATION DATE:  07/23/2023 REFERRING MD:  Dr. Jomarie Longs, CHIEF COMPLAINT:  Pulmonary edema    History of Present Illness:  Andrew Schmitt is a 72 year old male with a past medical history significant for diastolic CHF CKD stage IIIa, CAD, HTN, HLD, gout, and obesity who presented to the ED 12/6 for complaints of progressive weakness, shortness of breath, and lower extremity edema.  Workup on arrival consistent with acute exacerbation of CHF.  Patient was admitted per [facility] with cardiology consult.  Overnight 12/6 patient's mentation worsened with more somnolence prompting ABG which revealed hypoxia and hypercapnia.  BiPAP applied.  PCCM consulted this a.m.  Pertinent  Medical History  Diastolic CHF CKD stage IIIa, CAD, HTN, HLD, gout, and obesity   Significant Hospital Events: Including procedures, antibiotic start and stop dates in addition to other pertinent events   12/6 presented with weakness and shortness of breath found to be in acute exasperation CHF with AKI superimposed on CKD stage IIIa 12/7 worsening mentation secondary to hypercapnia in the setting of pulmonary edema, PCCM consulted 12/8 low-grade temp overnight with some hematuria secondary to Foley trauma 12/9 wearing BiPAP overnight , better mentation per nursing  Interim History / Subjective:  Patient more alert sitting up in bedside recliner no acute distress.  On BiPAP,  Daughter at bedside and updated 12/9 CXR 12/9 shows decreasing vascular congestion and right effusion appears smaller.   Objective   Blood pressure 121/65, pulse 88, temperature 98.5 F (36.9 C), temperature source Oral, resp. rate 18, height 5\' 6"  (1.676 m), weight 102.6 kg, SpO2 100%.    FiO2 (%):  [40 %] 40 %   Intake/Output Summary (Last 24 hours) at 07/25/2023 1220 Last data filed at 07/25/2023 1218 Gross per 24 hour  Intake 460 ml  Output 2125 ml   Net -1665 ml   Filed Weights   07/23/23 0626 07/25/23 0043 07/25/23 0423  Weight: 110.5 kg 102.6 kg 102.6 kg    Examination: General: Acute on chronic ill-appearing elderly male sitting up in bedside recliner in no acute distress, on BiPAP resting HEENT: [place]/AT, MM pink/moist, PERRL, thick neck, No LAD Neuro: Alert and oriented x 3, nonfocal, sleepy CV: s1s2 regular rate and rhythm, no murmur, rubs, or gallops, 2nd degree HB and frequent PVC's per tele PULM: Bilateral chest excursion, diminished bilaterally, distant no increased work of breathing on  BiPAP  GI: soft, bowel sounds active in all 4 quadrants, non-tender, non-distended, Body mass index is 36.51 kg/m.  Extremities: warm/dry, 2 + LE  lower extremity edema , improving per family Skin: no rashes or lesions  PCT 0.82, Net Negative 7646.7 Last BNP 183.1 on 12/5   Resolved Hospital Problem list     Assessment & Plan:  Acute decompensated HFpEF with volume overload -Most recent echocardiogram June 2024 EF 60-65 with moderate LVH, no WMA and grade 2 diastolic dysfunction Second-degree AV block Essential hypertension Hyperlipidemia Elevated troponin with history of CAD P: Primary management per cardiology Lasix dosing per cards Continuous telemetry Strict intake and output Daily weight to assess volume status Closely monitor renal function and electrolytes GDMT as able  Acute hypoxic and hypercapnic respiratory failure Pulmonary edema P: Continue nocturnal and as needed BiPAP ABG prn to assess CO2 levels if decreased LOC Aspiration precautions Ongoing volume removal as above with Lasix, as renal function allows Renal function with increase in creatinine to 4.02 overnight ,  nephrology following No acute indications for thoracentesis at this time, as effusion appears smaller Maintain Mag > 2 Trend ammonia level as needed  Acute kidney injury superimposed on CKD stage IIIb concern for component of cardiorenal  syndrome -Creatinine in November 2024 2.8 with GFR 27, creatinine 4.02 with GFR 15 as of 12/9 P: Nephrology now following, appreciate assistance Follow renal function Strict intake and output Trend electrolytes  Chronic anemia Thrombocytopenia P: Trend CBC Transfuse per protocol Hemoglobin goal greater than 7  Protein calorie malnutrition Hypoalbuminemia P: Encourage oral intake Protein supplementation as able  Best Practice (right click and "Reselect all SmartList Selections" daily)  Per primary  Critical care time: NA    Bevelyn Ngo, MSN, AGACNP-BC [facility] See Amion for personal pager PCCM on call pager [phone] [facility] Personal contact information can be found on Amion  If no contact or response made please call [phone] 07/25/2023, 12:20 PM

## 2023-07-25 NOTE — TOC Initial Note (Signed)
Transition of Care [facility]) - Initial/Assessment Note    Patient Details  Name: Andrew Schmitt MRN: [id number] Date of Birth: 03-21-1951  Transition of Care [facility]) CM/SW Contact:    Leone Haven, RN Phone Number: 07/25/2023, 2:54 PM  Clinical Narrative:                 From home with spouse, has PCP and insurance on file, states has no HH services in place at this time or DME at home.  Wife states she  will transport him  home at dc and she is support system, [facility] [place].  Pta self ambulatory.   Expected Discharge Plan: Home/Self Care Barriers to Discharge: Continued Medical Work up   Patient Goals and CMS Choice Patient states their goals for this hospitalization and ongoing recovery are:: return home   Choice offered to / list presented to : NA      Expected Discharge Plan and Services In-house Referral: NA Discharge Planning Services: CM Consult Post Acute Care Choice: NA Living arrangements for the past 2 months: Single Family Home                 DME Arranged: N/A DME Agency: NA       HH Arranged: NA          Prior Living Arrangements/Services Living arrangements for the past 2 months: Single Family Home Lives with:: Spouse Patient language and need for interpreter reviewed:: Yes Do you feel safe going back to the place where you live?: Yes      Need for Family Participation in Patient Care: Yes (Comment) Care giver support system in place?: Yes (comment)   Criminal Activity/Legal Involvement Pertinent to Current Situation/Hospitalization: No - Comment as needed  Activities of Daily Living   ADL Screening (condition at time of admission) Independently performs ADLs?: Yes (appropriate for developmental age) Is the patient deaf or have difficulty hearing?: No Does the patient have difficulty seeing, even when wearing glasses/contacts?: No Does the patient have difficulty concentrating, remembering, or making  decisions?: No  Permission Sought/Granted Permission sought to share information with : Case Manager Permission granted to share information with : Yes, Verbal Permission Granted              Emotional Assessment Appearance:: Appears stated age     Orientation: : Oriented to Self, Oriented to Place, Oriented to  Time, Oriented to Situation Alcohol / Substance Use: Not Applicable Psych Involvement: No (comment)  Admission diagnosis:  Acute CHF (congestive heart failure) (HCC) [I50.9] Symptomatic bradycardia [R00.1] Patient Active Problem List   Diagnosis Date Noted   Pleural effusion 07/22/2023   Second degree AV block, Mobitz type I 07/22/2023   Elevated troponin 07/22/2023   Hematuria 07/22/2023   Bilateral renal cysts 07/22/2023   Acute kidney injury superimposed on chronic kidney disease (HCC) 07/22/2023   Acute CHF (congestive heart failure) (HCC) 07/21/2023   Chronic diastolic heart failure (HCC) 07/14/2023   Stage 3b chronic kidney disease (HCC) 07/07/2023   Chronic gout due to renal impairment without tophus 12/08/2022   Coronary artery calcification 03/19/2022   Ascending aortic aneurysm (HCC) 03/19/2022   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   Shortness of breath 10/15/2021   CKD (chronic kidney disease), stage IV (HCC) 04/05/2019   Hypertension with heart disease 03/07/2019   Hyperlipidemia 03/07/2019   Class 3 severe obesity due to excess calories with serious comorbidity and  body mass index (BMI) of 40.0 to 44.9 in adult [facility]) 03/07/2019   PCP:  Dorothyann Peng, MD Pharmacy:   [phone] [address], [place] - [address] [place] [address] Phone: [phone] Fax: [phone]  [facility] [address], [place] - [address] [place] [address] Phone: [phone] Fax: [phone]  [facility] [address] [place] ([place]), [place] - [address] [place] [address] Phone: [phone] Fax: [phone]     Social Determinants of Health (SDOH) Social History: SDOH Screenings   Food Insecurity: No Food Insecurity (07/22/2023)  Housing: Low Risk  (07/22/2023)  Transportation Needs: No Transportation Needs (07/22/2023)  Utilities: Not At Risk (07/22/2023)  Alcohol Screen: Low Risk  (07/07/2023)  Depression (PHQ2-9): Low Risk  (07/07/2023)  Financial Resource Strain: Low Risk  (07/07/2023)  Physical Activity: Insufficiently Active (07/07/2023)  Social Connections: Moderately Integrated (07/07/2023)  Stress: No Stress Concern Present (07/07/2023)  Tobacco Use: Low Risk  (07/21/2023)  Health Literacy: Adequate Health Literacy (07/07/2023)   SDOH Interventions:     Readmission Risk Interventions     No data to display

## 2023-07-25 NOTE — Progress Notes (Signed)
Rounding Note    Patient Name: Andrew Schmitt Date of Encounter: 07/25/2023  [facility] Cardiologist: Chilton Si, MD   Subjective  His mental status has improved, he is able to answer questions.  Interval history His acute hypoxic and hypercapnic respiratory failure etiology is unclear.  Symptoms occurred after a flu shot.  We have been diuresing him.  He is diuresing well.  Has had some urinary retention being addressed by urology.  Nephrology was consulted and recommended continuing IV diuresis, no need for dialysis  Inpatient Medications    Scheduled Meds:  Chlorhexidine Gluconate Cloth  6 each Topical Daily   feeding supplement  237 mL Oral BID BM   finasteride  5 mg Oral Daily   furosemide  80 mg Intravenous BID   guaiFENesin  600 mg Oral BID   haloperidol lactate  1 mg Intravenous Once   heparin injection (subcutaneous)  5,000 Units Subcutaneous Q8H   hydrALAZINE  10 mg Oral BID   polyethylene glycol  17 g Oral Daily   rosuvastatin  10 mg Oral Daily   tamsulosin  0.4 mg Oral Daily   Continuous Infusions:   PRN Meds: acetaminophen **OR** acetaminophen   Vital Signs    Vitals:   07/25/23 0259 07/25/23 0423 07/25/23 0511 07/25/23 0759  BP:   130/73 129/61  Pulse: 87  88   Resp:   (!) 24 18  Temp:   98.4 F (36.9 C) 99.2 F (37.3 C)  TempSrc:   Axillary Oral  SpO2: 100%  100%   Weight:  102.6 kg    Height:        Intake/Output Summary (Last 24 hours) at 07/25/2023 0952 Last data filed at 07/25/2023 0939 Gross per 24 hour  Intake 460 ml  Output 2200 ml  Net -1740 ml      07/25/2023    4:23 AM 07/25/2023   12:43 AM 07/24/2023    4:52 AM  Last 3 Weights  Weight (lbs) 226 lb 3.1 oz 226 lb 3.1 oz --  Weight (kg) 102.6 kg 102.6 kg --      Telemetry    Sinus rhythm-Personally Reviewed  Physical Exam   GEN: No acute distress.  Resting but has mild wob Neck: +JVD  Cardiac: RRR, no murmurs, rubs, or gallops.  Respiratory: on [place],  improved air movement GI: Soft, nontender, non-distended  MS: Bilateral LE edema; No deformity. Neuro:  Nonfocal  Psych: Normal affect   New pertinent results (labs, ECG, imaging, cardiac studies)    TTE 07/23/2023 1. Left ventricular ejection fraction, by estimation, is 65 to 70%. The  left ventricle has normal function. The left ventricle has no regional  wall motion abnormalities. There is moderate left ventricular hypertrophy.  Left ventricular diastolic  parameters are indeterminate.   2. Right ventricular systolic function is normal. The right ventricular  size is normal.   3. Left atrial size was moderately dilated.   4. The mitral valve is abnormal. Trivial mitral valve regurgitation. No  evidence of mitral stenosis.   5. The aortic valve is tricuspid. There is mild calcification of the  aortic valve. Aortic valve regurgitation is not visualized. Aortic valve  sclerosis is present, with no evidence of aortic valve stenosis.   6. The inferior vena cava is normal in size with greater than 50%  respiratory variability, suggesting right atrial pressure of 3 mmHg.    CT chest abd/pelvis 1. No aneurysm identified. 2. Moderate left and small right pleural  effusions with compressiveatelectasis of the left lower lobe and portion of the left upper lobe. 3. Cardiomegaly. 4. Mild bilateral perinephric fat stranding. Correlate clinically for pyelonephritis. 5. Mild diffuse body wall edema. 6. Bilateral renal cysts. No follow-up imaging recommended. 7. Sigmoid colon diverticulosis. 8. Prostatomegaly. 9. Aortic atherosclerosis.  Patient Profile     72 y.o. male with a hx of HTN, HLD, CKD III, gout, obesity who is being seen for the evaluation of bradycardia and heart failure.   Assessment & Plan    Acute hypoxic and hypercapnic respiratory failure Acute on chronic diastolic heart failure Acute kidney injury on chronic kidney disease stage 3b/4-cardiorenal and urinary  obstruction  -  can continue IV lasix -Creatinine is steadily increasing; he was found to have urinary obstruction; managed by urology.;  Nephrology has been consulted and agree with continued IV diuresis and addressing urinary retention. Hope his renal fxn will plateau and then improve. Clinically he is doing better -having good urine output net 2.1L -Admission weight 254-> 243->226 (he was 261 pounds in April)   AV block -has had some blocked PACs but no significant pauses -has not had significant bradycardia on my review of telemetry here, though reported as HR 30s-40s on EMS arrival -we are holding carvedilol -No further workup needed   Hypertension -normal to borderline low today -holding carvedilol as above -limited options given CKD -ok to continue amlodipine, hydralazine  -hold chlorthalidone and oral lasix while getting IV lasxi   Coronary artery calcification Hyperlipidemia -continue rosuvastatin, aspirin   Time Spent Directly with Patient:  I have spent a total of 35 minutes with the patient reviewing hospital notes, telemetry, EKGs, labs and examining the patient as well as establishing an assessment and plan that was discussed personally with the patient.       Signed, Maisie Fus, MD  07/25/2023, 9:52 AM

## 2023-07-25 NOTE — Progress Notes (Signed)
PROGRESS NOTE    Andrew Schmitt  WGN:[id number] DOB: [birth year] DOA: 07/21/2023 PCP: Dorothyann Peng, MD  [name] with chronic diastolic CHF, hypertension, dyslipidemia, ascending aortic aneurysm, BPH, CKD 3b/4, chronic anemia, gout, obesity presented to the ED with progressive weakness and shortness of breath.  In the ER labs noted hemoglobin of 10.4, platelets 132, creatinine 3.1, BNP 182, troponin 337, 278, CT chest abdomen pelvis with moderate left and small right pleural effusions, mild bilateral perinephric stranding, diffuse abdominal wall edema. -Admitted,  -12/6: started on diuretics, cardiology following, Coude catheter placed for hematuria w/ CLots and symptoms before overt retention -Overnight more encephalopathic, placed on BiPAP, very mild respiratory acidosis, pCO2 in the 60-70 range   Subjective: -Tolerated BiPAP throughout the night, feels better overall  Assessment and Plan:  Acute on chronic diastolic CHF -Last echo 6/24 with EF 60-65%, moderate LVH, grade 2 DD, mildly reduced RV -Diuresed on IV Lasix, 7.4 L negative, cards and nephrology following -Repeat echo with EF 65-70% moderate LVH, indeterminate diastolic parameters, normal RV -Continue low-dose hydralazine -Creatinine slowly worsening, appreciate nephrology input  -GDMT limited by AKI/CKD  Mild encephalopathy -Multifactorial, likely from combination of hypercarbia,, mild uremia -Ammonia level is normal, continue BiPAP intermittently through the day and overnight -Improved today, resume BiPAP in the afternoon for naps and then again at bedtime -Foley placed for urinary retention, hematuria with clots, repeat urinalysis unremarkable, monitor   Second-degree AV block, Mobitz type I Bradycardia -Heart rate improving.  Carvedilol on hold, cards following   Elevated troponin History of CAD -Suspected to be from demand ischemia, no symptoms of ACS -Continue aspirin, statin, holding BB   AKI on CKD stage  IIIb -Likely cardiorenal, creatinine now 3.4 from baseline of 2-2.5  -Holding losartan -Monitor with diuresis -Foley catheter placed yesterday for hematuria with clots, ? early retention, has known BPH, CT on admission with bilateral renal cysts as well -Some worsening AKI, see discussion above, discussed with nephrology   Bilateral renal cysts Mild bilateral perinephric fat stranding seen on CT UA not suggestive of infection. -CT findings likely from volume overload, no symptoms of pyelonephritis   BPH Continue Proscar and Flomax.   Chronic anemia and thrombocytopenia Stable, monitor   Gout Hold allopurinol given reduced creatinine clearance.   DVT prophylaxis: heparin subcutaneous for DVT prophylaxis Code Status: Full Code  Family Communication: Daughter at bedside Consults : Cardiology, nephrology   Procedures:   Antimicrobials:    Objective: Vitals:   07/25/23 0423 07/25/23 0511 07/25/23 0759 07/25/23 1027  BP:  130/73 129/61 121/65  Pulse:  88    Resp:  (!) 24 18 18   Temp:  98.4 F (36.9 C) 99.2 F (37.3 C) 98.5 F (36.9 C)  TempSrc:  Axillary Oral Oral  SpO2:  100%    Weight: 102.6 kg     Height:        Intake/Output Summary (Last 24 hours) at 07/25/2023 1140 Last data filed at 07/25/2023 7846 Gross per 24 hour  Intake 460 ml  Output 1800 ml  Net -1340 ml   Filed Weights   07/23/23 0626 07/25/23 0043 07/25/23 0423  Weight: 110.5 kg 102.6 kg 102.6 kg    Examination:  General exam: More awake alert and appropriate today, AAOx3 HEENT: Positive JVD CVS: S1-S2, regular rhythm Lungs: Few Rales at the right base otherwise clear Abdomen: Soft, nontender, bowel sounds present Extremities: 1+ edema with venous stasis changes  Neuro: Moves all extremities, no localizing signs   Data Reviewed:  CBC: Recent Labs  Lab 07/21/23 1415 07/21/23 1420 07/22/23 0343 07/23/23 0229 07/23/23 2113 07/24/23 0259 07/25/23 0252  WBC 4.3  --  4.6 7.2 9.7 9.1  7.0  NEUTROABS 3.0  --   --   --   --   --   --   HGB 10.4*   < > 9.9* 9.5* 10.1* 9.5* 8.9*  HCT 34.1*   < > 33.3* 32.2* 34.1* 31.6* 29.8*  MCV 96.1  --  96.0 97.6 98.3 97.5 97.1  PLT 132*  --  143* 113* 104* 88* 80*   < > = values in this interval not displayed.   Basic Metabolic Panel: Recent Labs  Lab 07/21/23 1415 07/21/23 1420 07/22/23 0343 07/23/23 0229 07/24/23 0259 07/25/23 0252  NA 135 138 136 139 138 138  K 4.5 4.6 4.6 4.7 4.2 4.3  CL 101  --  101 100 97* 95*  CO2 25  --  26 29 28  32  GLUCOSE 128*  --  111* 125* 141* 116*  BUN 63*  --  61* 61* 68* 81*  CREATININE 3.18*  --  3.04* 3.41* 3.65* 4.02*  CALCIUM 8.8*  --  8.7* 8.8* 9.1 9.0  MG 2.9*  --   --   --   --   --    GFR: Estimated Creatinine Clearance: 18.1 mL/min (A) (by C-G formula based on SCr of 4.02 mg/dL (H)). Liver Function Tests: Recent Labs  Lab 07/21/23 1415 07/23/23 0229 07/24/23 0259 07/25/23 0252  AST 30 20 26 28   ALT 32 23 25 27   ALKPHOS 69 61 54 47  BILITOT 0.8 0.8 1.3* 1.3*  PROT 7.5 7.1 7.6 7.3  ALBUMIN 3.1* 2.9* 3.6 3.2*   No results for input(s): "LIPASE", "AMYLASE" in the last 168 hours. Recent Labs  Lab 07/22/23 0900 07/22/23 2221  AMMONIA 35 36*   Coagulation Profile: No results for input(s): "INR", "PROTIME" in the last 168 hours. Cardiac Enzymes: No results for input(s): "CKTOTAL", "CKMB", "CKMBINDEX", "TROPONINI" in the last 168 hours. BNP (last 3 results) No results for input(s): "PROBNP" in the last 8760 hours. HbA1C: No results for input(s): "HGBA1C" in the last 72 hours. CBG: Recent Labs  Lab 07/22/23 1513  GLUCAP 134*   Lipid Profile: No results for input(s): "CHOL", "HDL", "LDLCALC", "TRIG", "CHOLHDL", "LDLDIRECT" in the last 72 hours. Thyroid Function Tests: No results for input(s): "TSH", "T4TOTAL", "FREET4", "T3FREE", "THYROIDAB" in the last 72 hours.  Anemia Panel: No results for input(s): "VITAMINB12", "FOLATE", "FERRITIN", "TIBC", "IRON",  "RETICCTPCT" in the last 72 hours. Urine analysis:    Component Value Date/Time   COLORURINE STRAW (A) 07/23/2023 2150   APPEARANCEUR CLEAR 07/23/2023 2150   LABSPEC 1.006 07/23/2023 2150   PHURINE 5.0 07/23/2023 2150   GLUCOSEU 50 (A) 07/23/2023 2150   HGBUR MODERATE (A) 07/23/2023 2150   BILIRUBINUR NEGATIVE 07/23/2023 2150   BILIRUBINUR negative 04/09/2021 0954   KETONESUR NEGATIVE 07/23/2023 2150   PROTEINUR 30 (A) 07/23/2023 2150   UROBILINOGEN 0.2 04/09/2021 0954   NITRITE NEGATIVE 07/23/2023 2150   LEUKOCYTESUR NEGATIVE 07/23/2023 2150   Sepsis Labs: @LABRCNTIP (procalcitonin:4,lacticidven:4)  ) Recent Results (from the past 240 hour(s))  Culture, blood (Routine X 2) w Reflex to ID Panel     Status: None (Preliminary result)   Collection Time: 07/23/23  9:12 PM   Specimen: BLOOD RIGHT HAND  Result Value Ref Range Status   Specimen Description BLOOD RIGHT HAND  Final   Special Requests   Final  BOTTLES DRAWN AEROBIC AND ANAEROBIC Blood Culture results may not be optimal due to an excessive volume of blood received in culture bottles   Culture   Final    NO GROWTH 2 DAYS Performed at [facility], [address] [place], [place] [address]    Report Status PENDING  Incomplete  Culture, blood (Routine X 2) w Reflex to ID Panel     Status: None (Preliminary result)   Collection Time: 07/23/23  9:12 PM   Specimen: BLOOD  Result Value Ref Range Status   Specimen Description BLOOD LEFT ANTECUBITAL  Final   Special Requests   Final    BOTTLES DRAWN AEROBIC AND ANAEROBIC Blood Culture adequate volume   Culture   Final    NO GROWTH 2 DAYS Performed at [facility], [address], [place], [place] [address]    Report Status PENDING  Incomplete  Urine Culture (for pregnant, neutropenic or urologic patients or patients with an indwelling urinary catheter)     Status: None   Collection Time: 07/23/23  9:50 PM   Specimen: Urine, Catheterized  Result Value Ref  Range Status   Specimen Description URINE, CATHETERIZED  Final   Special Requests NONE  Final   Culture   Final    NO GROWTH Performed at [facility], [address] [place], [place] [address]    Report Status 07/25/2023 FINAL  Final     Radiology Studies: ECHOCARDIOGRAM COMPLETE  Result Date: 07/23/2023    ECHOCARDIOGRAM REPORT   Patient Name:   Andrew Schmitt [facility] Date of Exam: 07/23/2023 Medical Rec #:  [id number]         Height:       66.0 in Accession #:    [id number]        Weight:       243.6 lb Date of Birth:  1951-02-22          BSA:          2.175 m Patient Age:    72 years          BP:           129/72 mmHg Patient Gender: M                 HR:           76 bpm. Exam Location:  Inpatient Procedure: 2D Echo, Color Doppler and Cardiac Doppler Indications:    Dyspnea  History:        Patient has prior history of Echocardiogram examinations, most                 recent 01/26/2023. CHF, CAD, CKD; Risk Factors:Hypertension and                 Dyslipidemia.  Sonographer:    Milbert Coulter Referring Phys: 61 Amaiya Scruton IMPRESSIONS  1. Left ventricular ejection fraction, by estimation, is 65 to 70%. The left ventricle has normal function. The left ventricle has no regional wall motion abnormalities. There is moderate left ventricular hypertrophy. Left ventricular diastolic parameters are indeterminate.  2. Right ventricular systolic function is normal. The right ventricular size is normal.  3. Left atrial size was moderately dilated.  4. The mitral valve is abnormal. Trivial mitral valve regurgitation. No evidence of mitral stenosis.  5. The aortic valve is tricuspid. There is mild calcification of the aortic valve. Aortic valve regurgitation is not visualized. Aortic valve sclerosis is present, with no  evidence of aortic valve stenosis.  6. The inferior vena cava is normal in size with greater than 50% respiratory variability, suggesting right atrial pressure of 3 mmHg. FINDINGS  Left  Ventricle: Left ventricular ejection fraction, by estimation, is 65 to 70%. The left ventricle has normal function. The left ventricle has no regional wall motion abnormalities. The left ventricular internal cavity size was small. There is moderate  left ventricular hypertrophy. Left ventricular diastolic parameters are indeterminate. Right Ventricle: The right ventricular size is normal. No increase in right ventricular wall thickness. Right ventricular systolic function is normal. Left Atrium: Left atrial size was moderately dilated. Right Atrium: Right atrial size was normal in size. Pericardium: There is no evidence of pericardial effusion. Mitral Valve: The mitral valve is abnormal. There is mild thickening of the mitral valve leaflet(s). Trivial mitral valve regurgitation. No evidence of mitral valve stenosis. Tricuspid Valve: The tricuspid valve is normal in structure. Tricuspid valve regurgitation is not demonstrated. No evidence of tricuspid stenosis. Aortic Valve: The aortic valve is tricuspid. There is mild calcification of the aortic valve. Aortic valve regurgitation is not visualized. Aortic valve sclerosis is present, with no evidence of aortic valve stenosis. Aortic valve mean gradient measures 6.0 mmHg. Aortic valve peak gradient measures 12.2 mmHg. Aortic valve area, by VTI measures 2.83 cm. Pulmonic Valve: The pulmonic valve was normal in structure. Pulmonic valve regurgitation is mild. No evidence of pulmonic stenosis. Aorta: The aortic root is normal in size and structure. Venous: The inferior vena cava is normal in size with greater than 50% respiratory variability, suggesting right atrial pressure of 3 mmHg. IAS/Shunts: The interatrial septum was not well visualized.  LEFT VENTRICLE PLAX 2D LVIDd:         4.50 cm   Diastology LVIDs:         2.80 cm   LV e' medial:    8.81 cm/s LV PW:         1.40 cm   LV E/e' medial:  14.6 LV IVS:        1.40 cm   LV e' lateral:   7.62 cm/s LVOT diam:     2.10  cm   LV E/e' lateral: 16.9 LV SV:         74 LV SV Index:   34 LVOT Area:     3.46 cm  RIGHT VENTRICLE RV S prime:     13.90 cm/s TAPSE (M-mode): 1.7 cm LEFT ATRIUM             Index        RIGHT ATRIUM           Index LA diam:        4.30 cm 1.98 cm/m   RA Area:     31.00 cm LA Vol (A2C):   76.5 ml 35.18 ml/m  RA Volume:   125.00 ml 57.48 ml/m LA Vol (A4C):   70.2 ml 32.28 ml/m LA Biplane Vol: 73.1 ml 33.61 ml/m  AORTIC VALVE AV Area (Vmax):    2.89 cm AV Area (Vmean):   2.68 cm AV Area (VTI):     2.83 cm AV Vmax:           175.00 cm/s AV Vmean:          115.000 cm/s AV VTI:            0.262 m AV Peak Grad:      12.2 mmHg AV Mean Grad:      6.0  mmHg LVOT Vmax:         146.00 cm/s LVOT Vmean:        89.100 cm/s LVOT VTI:          0.214 m LVOT/AV VTI ratio: 0.82  AORTA Ao Root diam: 3.60 cm Ao Asc diam:  3.70 cm MITRAL VALVE MV Area (PHT): 3.34 cm     SHUNTS MV Decel Time: 227 msec     Systemic VTI:  0.21 m MV E velocity: 129.00 cm/s  Systemic Diam: 2.10 cm MV A velocity: 107.00 cm/s MV E/A ratio:  1.21 Charlton Haws MD Electronically signed by Charlton Haws MD Signature Date/Time: 07/23/2023/4:35:16 PM    Final      Scheduled Meds:  Chlorhexidine Gluconate Cloth  6 each Topical Daily   feeding supplement  237 mL Oral BID BM   finasteride  5 mg Oral Daily   furosemide  80 mg Intravenous BID   guaiFENesin  600 mg Oral BID   haloperidol lactate  1 mg Intravenous Once   heparin injection (subcutaneous)  5,000 Units Subcutaneous Q8H   hydrALAZINE  10 mg Oral BID   polyethylene glycol  17 g Oral Daily   rosuvastatin  10 mg Oral Daily   tamsulosin  0.4 mg Oral Daily   Continuous Infusions:     LOS: 3 days    Time spent:    Zannie Cove, MD [facility]   07/25/2023, 11:40 AM

## 2023-07-25 NOTE — Plan of Care (Signed)

## 2023-07-25 NOTE — Progress Notes (Signed)
Patient ID: Andrew Schmitt, male   DOB: 13-Jul-1951, 72 y.o.   MRN: [id number] [facility] Progress Note   Assessment/ Plan:   1.  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  He continues to maintain a good response to diuretics with some rise in his creatinine.  However, he continues to be volume overloaded.  Will continue with IV Lasix.  We will continue to hold ARB and SGLT2 inhibitor for now 2.  Acute exacerbation of congestive heart failure: Good response to IV diuretics but remains significantly volume overloaded.  Continue with IV diuresis Lasix 80 mg twice daily. 3.  Encephalopathy: Possibly multifactorial with prominent component associated with his hypercarbia.  Minimal role from azotemia but not unequivocally indicating dialysis.  Seems better today. 4.  Anemia: Secondary to chronic disease including chronic kidney disease.  IV iron ordered 5.  Hypertension: Holding antihypertensive therapy at this time (except tamsulosin for BPH) with ongoing diuresis.  Subjective:   Patient more awake and alert today.  States he overall feels better.  Denies any complaints.  Creatinine increasing and urine output decent.   Objective:   BP 129/61 (BP Location: Left Arm)   Pulse 88   Temp 99.2 F (37.3 C) (Oral)   Resp 18   Ht 5\' 6"  (1.676 m)   Wt 102.6 kg   SpO2 100%   BMI 36.51 kg/m   Intake/Output Summary (Last 24 hours) at 07/25/2023 1017 Last data filed at 07/25/2023 5956 Gross per 24 hour  Intake 460 ml  Output 2200 ml  Net -1740 ml   Weight change:   Physical Exam: Gen: Appears comfortable sitting up in recliner, nad CVS: normal rate, no murmurs Resp: course crackles bilaterally with some expiratory wheezing, mild iwob Abd: Soft, obese, nontender, bowel sounds normal Ext: 2+ bilateral pitting lower extremity edema with  venous stasis changes  Imaging: ECHOCARDIOGRAM COMPLETE  Result Date: 07/23/2023    ECHOCARDIOGRAM REPORT   Patient Name:   Andrew Schmitt [facility] Date of Exam: 07/23/2023 Medical Rec #:  [id number]         Height:       66.0 in Accession #:    [id number]        Weight:       243.6 lb Date of Birth:  02-22-1951          BSA:          2.175 m Patient Age:    72 years          BP:           129/72 mmHg Patient Gender: M                 HR:           76 bpm. Exam Location:  Inpatient Procedure: 2D Echo, Color Doppler and Cardiac Doppler Indications:    Dyspnea  History:        Patient has prior history of Echocardiogram examinations, most                 recent 01/26/2023. CHF, CAD, CKD; Risk Factors:Hypertension and                 Dyslipidemia.  Sonographer:    Milbert Coulter Referring Phys: [id number] PREETHA JOSEPH IMPRESSIONS  1. Left ventricular ejection fraction, by estimation, is 65 to 70%. The left ventricle  has normal function. The left ventricle has no regional wall motion abnormalities. There is moderate left ventricular hypertrophy. Left ventricular diastolic parameters are indeterminate.  2. Right ventricular systolic function is normal. The right ventricular size is normal.  3. Left atrial size was moderately dilated.  4. The mitral valve is abnormal. Trivial mitral valve regurgitation. No evidence of mitral stenosis.  5. The aortic valve is tricuspid. There is mild calcification of the aortic valve. Aortic valve regurgitation is not visualized. Aortic valve sclerosis is present, with no evidence of aortic valve stenosis.  6. The inferior vena cava is normal in size with greater than 50% respiratory variability, suggesting right atrial pressure of 3 mmHg. FINDINGS  Left Ventricle: Left ventricular ejection fraction, by estimation, is 65 to 70%. The left ventricle has normal function. The left ventricle has no regional wall motion abnormalities. The left ventricular internal cavity size was small. There is moderate   left ventricular hypertrophy. Left ventricular diastolic parameters are indeterminate. Right Ventricle: The right ventricular size is normal. No increase in right ventricular wall thickness. Right ventricular systolic function is normal. Left Atrium: Left atrial size was moderately dilated. Right Atrium: Right atrial size was normal in size. Pericardium: There is no evidence of pericardial effusion. Mitral Valve: The mitral valve is abnormal. There is mild thickening of the mitral valve leaflet(s). Trivial mitral valve regurgitation. No evidence of mitral valve stenosis. Tricuspid Valve: The tricuspid valve is normal in structure. Tricuspid valve regurgitation is not demonstrated. No evidence of tricuspid stenosis. Aortic Valve: The aortic valve is tricuspid. There is mild calcification of the aortic valve. Aortic valve regurgitation is not visualized. Aortic valve sclerosis is present, with no evidence of aortic valve stenosis. Aortic valve mean gradient measures 6.0 mmHg. Aortic valve peak gradient measures 12.2 mmHg. Aortic valve area, by VTI measures 2.83 cm. Pulmonic Valve: The pulmonic valve was normal in structure. Pulmonic valve regurgitation is mild. No evidence of pulmonic stenosis. Aorta: The aortic root is normal in size and structure. Venous: The inferior vena cava is normal in size with greater than 50% respiratory variability, suggesting right atrial pressure of 3 mmHg. IAS/Shunts: The interatrial septum was not well visualized.  LEFT VENTRICLE PLAX 2D LVIDd:         4.50 cm   Diastology LVIDs:         2.80 cm   LV e' medial:    8.81 cm/s LV PW:         1.40 cm   LV E/e' medial:  14.6 LV IVS:        1.40 cm   LV e' lateral:   7.62 cm/s LVOT diam:     2.10 cm   LV E/e' lateral: 16.9 LV SV:         74 LV SV Index:   34 LVOT Area:     3.46 cm  RIGHT VENTRICLE RV S prime:     13.90 cm/s TAPSE (M-mode): 1.7 cm LEFT ATRIUM             Index        RIGHT ATRIUM           Index LA diam:        4.30 cm 1.98  cm/m   RA Area:     31.00 cm LA Vol (A2C):   76.5 ml 35.18 ml/m  RA Volume:   125.00 ml 57.48 ml/m LA Vol (A4C):   70.2 ml 32.28 ml/m LA Biplane Vol: 73.1 ml  33.61 ml/m  AORTIC VALVE AV Area (Vmax):    2.89 cm AV Area (Vmean):   2.68 cm AV Area (VTI):     2.83 cm AV Vmax:           175.00 cm/s AV Vmean:          115.000 cm/s AV VTI:            0.262 m AV Peak Grad:      12.2 mmHg AV Mean Grad:      6.0 mmHg LVOT Vmax:         146.00 cm/s LVOT Vmean:        89.100 cm/s LVOT VTI:          0.214 m LVOT/AV VTI ratio: 0.82  AORTA Ao Root diam: 3.60 cm Ao Asc diam:  3.70 cm MITRAL VALVE MV Area (PHT): 3.34 cm     SHUNTS MV Decel Time: 227 msec     Systemic VTI:  0.21 m MV E velocity: 129.00 cm/s  Systemic Diam: 2.10 cm MV A velocity: 107.00 cm/s MV E/A ratio:  1.21 Charlton Haws MD Electronically signed by Charlton Haws MD Signature Date/Time: 07/23/2023/4:35:16 PM    Final     Labs: BMET Recent Labs  Lab 07/21/23 1415 07/21/23 1420 07/22/23 0343 07/23/23 0229 07/24/23 0259 07/25/23 0252  NA 135 138 136 139 138 138  K 4.5 4.6 4.6 4.7 4.2 4.3  CL 101  --  101 100 97* 95*  CO2 25  --  26 29 28  32  GLUCOSE 128*  --  111* 125* 141* 116*  BUN 63*  --  61* 61* 68* 81*  CREATININE 3.18*  --  3.04* 3.41* 3.65* 4.02*  CALCIUM 8.8*  --  8.7* 8.8* 9.1 9.0   CBC Recent Labs  Lab 07/21/23 1415 07/21/23 1420 07/23/23 0229 07/23/23 2113 07/24/23 0259 07/25/23 0252  WBC 4.3   < > 7.2 9.7 9.1 7.0  NEUTROABS 3.0  --   --   --   --   --   HGB 10.4*   < > 9.5* 10.1* 9.5* 8.9*  HCT 34.1*   < > 32.2* 34.1* 31.6* 29.8*  MCV 96.1   < > 97.6 98.3 97.5 97.1  PLT 132*   < > 113* 104* 88* 80*   < > = values in this interval not displayed.    Medications:     Chlorhexidine Gluconate Cloth  6 each Topical Daily   feeding supplement  237 mL Oral BID BM   finasteride  5 mg Oral Daily   furosemide  80 mg Intravenous BID   guaiFENesin  600 mg Oral BID   haloperidol lactate  1 mg Intravenous Once    heparin injection (subcutaneous)  5,000 Units Subcutaneous Q8H   hydrALAZINE  10 mg Oral BID   polyethylene glycol  17 g Oral Daily   rosuvastatin  10 mg Oral Daily   tamsulosin  0.4 mg Oral Daily   Darnell Level  07/25/2023, 10:17 AM

## 2023-07-25 NOTE — Progress Notes (Signed)
   07/25/23 1936  BiPAP/CPAP/SIPAP  BiPAP/CPAP/SIPAP Pt Type Adult  BiPAP/CPAP/SIPAP V60  Mask Type Full face mask  Mask Size Extra large  Set Rate 20 breaths/min  Respiratory Rate 23 breaths/min  IPAP 20 cmH20  EPAP 8 cmH2O  FiO2 (%) 35 %  Minute Ventilation 19.9  Leak 7  Peak Inspiratory Pressure (PIP) 20  Tidal Volume (Vt) 555  Patient Home Equipment No  Auto Titrate No  Press High Alarm 25 cmH2O  Press Low Alarm 5 cmH2O  BiPAP/CPAP /SiPAP Vitals  Pulse Rate 75  Resp (!) 22  SpO2 98 %  Bilateral Breath Sounds Diminished  MEWS Score/Color  MEWS Score 2  MEWS Score Color Yellow

## 2023-07-26 ENCOUNTER — Encounter (HOSPITAL_COMMUNITY): Payer: Self-pay | Admitting: Internal Medicine

## 2023-07-26 ENCOUNTER — Inpatient Hospital Stay (HOSPITAL_COMMUNITY): Payer: Medicare PPO

## 2023-07-26 ENCOUNTER — Encounter (HOSPITAL_COMMUNITY)
Admission: EM | Disposition: A | Discharge status: Home Health | Payer: Self-pay | Source: Home / Self Care | Attending: Internal Medicine

## 2023-07-26 DIAGNOSIS — J9602 Acute respiratory failure with hypercapnia: Secondary | ICD-10-CM | POA: Diagnosis not present

## 2023-07-26 DIAGNOSIS — J90 Pleural effusion, not elsewhere classified: Secondary | ICD-10-CM | POA: Diagnosis not present

## 2023-07-26 DIAGNOSIS — R0602 Shortness of breath: Secondary | ICD-10-CM | POA: Diagnosis not present

## 2023-07-26 DIAGNOSIS — J9601 Acute respiratory failure with hypoxia: Secondary | ICD-10-CM | POA: Insufficient documentation

## 2023-07-26 DIAGNOSIS — N179 Acute kidney failure, unspecified: Secondary | ICD-10-CM | POA: Diagnosis not present

## 2023-07-26 DIAGNOSIS — I441 Atrioventricular block, second degree: Secondary | ICD-10-CM | POA: Diagnosis not present

## 2023-07-26 DIAGNOSIS — I5031 Acute diastolic (congestive) heart failure: Secondary | ICD-10-CM | POA: Diagnosis not present

## 2023-07-26 DIAGNOSIS — E8779 Other fluid overload: Secondary | ICD-10-CM | POA: Diagnosis not present

## 2023-07-26 HISTORY — PX: THORACENTESIS: SHX235

## 2023-07-26 LAB — CBC
HCT: 29.5 % — ABNORMAL LOW (ref 39.0–52.0)
Hemoglobin: 8.7 g/dL — ABNORMAL LOW (ref 13.0–17.0)
MCH: 28.3 pg (ref 26.0–34.0)
MCHC: 29.5 g/dL — ABNORMAL LOW (ref 30.0–36.0)
MCV: 96.1 fL (ref 80.0–100.0)
Platelets: 85 10*3/uL — ABNORMAL LOW (ref 150–400)
RBC: 3.07 MIL/uL — ABNORMAL LOW (ref 4.22–5.81)
RDW: 14.2 % (ref 11.5–15.5)
WBC: 5.7 10*3/uL (ref 4.0–10.5)
nRBC: 0 % (ref 0.0–0.2)

## 2023-07-26 LAB — PROTEIN, PLEURAL OR PERITONEAL FLUID: Total protein, fluid: 4.6 g/dL

## 2023-07-26 LAB — BASIC METABOLIC PANEL
Anion gap: 12 (ref 5–15)
BUN: 96 mg/dL — ABNORMAL HIGH (ref 8–23)
CO2: 32 mmol/L (ref 22–32)
Calcium: 9 mg/dL (ref 8.9–10.3)
Chloride: 93 mmol/L — ABNORMAL LOW (ref 98–111)
Creatinine, Ser: 4.15 mg/dL — ABNORMAL HIGH (ref 0.61–1.24)
GFR, Estimated: 14 mL/min — ABNORMAL LOW (ref >60)
Glucose, Bld: 114 mg/dL — ABNORMAL HIGH (ref 70–99)
Potassium: 4.6 mmol/L (ref 3.5–5.1)
Sodium: 137 mmol/L (ref 135–145)

## 2023-07-26 LAB — LACTATE DEHYDROGENASE: LDH: 210 U/L — ABNORMAL HIGH (ref 98–192)

## 2023-07-26 LAB — BODY FLUID CELL COUNT WITH DIFFERENTIAL
Eos, Fluid: 0 %
Lymphs, Fluid: 97 %
Monocyte-Macrophage-Serous Fluid: 1 % — ABNORMAL LOW (ref 50–90)
Neutrophil Count, Fluid: 2 % (ref 0–25)
Total Nucleated Cell Count, Fluid: 2566 uL — ABNORMAL HIGH (ref 0–1000)

## 2023-07-26 LAB — LACTATE DEHYDROGENASE, PLEURAL OR PERITONEAL FLUID: LD, Fluid: 126 U/L — ABNORMAL HIGH (ref 3–23)

## 2023-07-26 LAB — BRAIN NATRIURETIC PEPTIDE: B Natriuretic Peptide: 111 pg/mL — ABNORMAL HIGH (ref 0.0–100.0)

## 2023-07-26 LAB — GLUCOSE, PLEURAL OR PERITONEAL FLUID: Glucose, Fluid: 137 mg/dL

## 2023-07-26 SURGERY — THORACENTESIS
Anesthesia: LOCAL | Laterality: Left

## 2023-07-26 MED ORDER — METOLAZONE 5 MG PO TABS
5.0000 mg | ORAL_TABLET | Freq: Once | ORAL | Status: AC
  Administered 2023-07-26: 5 mg via ORAL
  Filled 2023-07-26: qty 1

## 2023-07-26 MED ORDER — FUROSEMIDE 10 MG/ML IJ SOLN
160.0000 mg | Freq: Three times a day (TID) | INTRAMUSCULAR | Status: DC
  Administered 2023-07-26 – 2023-07-28 (×6): 160 mg via INTRAVENOUS
  Filled 2023-07-26 (×3): qty 10
  Filled 2023-07-26 (×4): qty 16
  Filled 2023-07-26: qty 2
  Filled 2023-07-26 (×2): qty 16
  Filled 2023-07-26: qty 2

## 2023-07-26 NOTE — Progress Notes (Signed)
Rounding Note    Patient Name: Andrew Schmitt Date of Encounter: 07/26/2023   [facility] Cardiologist: Chilton Si, MD   Subjective  His mental status has improved still, not back to baseline  Interval history His acute hypoxic and hypercapnic respiratory failure etiology is unclear.  Symptoms occurred after a flu shot.  We have been diuresing him.  He is diuresing well.  Has had some urinary retention being addressed by urology.  Nephrology was consulted and recommended continuing IV diuresis, however his renal function continues to decline. Has persistent large left pleural effusion.   Inpatient Medications    Scheduled Meds:  Chlorhexidine Gluconate Cloth  6 each Topical Daily   feeding supplement  237 mL Oral BID BM   finasteride  5 mg Oral Daily   guaiFENesin  600 mg Oral BID   haloperidol lactate  1 mg Intravenous Once   heparin injection (subcutaneous)  5,000 Units Subcutaneous Q8H   polyethylene glycol  17 g Oral Daily   rosuvastatin  10 mg Oral Daily   tamsulosin  0.4 mg Oral Daily   Continuous Infusions:  furosemide      PRN Meds: acetaminophen **OR** acetaminophen   Vital Signs    Vitals:   07/25/23 2318 07/25/23 2342 07/26/23 0423 07/26/23 0825  BP:  110/66 (!) 100/58 106/62  Pulse:  73 82 82  Resp: 20 20 19 19   Temp:  98.1 F (36.7 C) 98.3 F (36.8 C) 98.4 F (36.9 C)  TempSrc:  Axillary Oral Oral  SpO2:  100% 100% 98%  Weight:   104.1 kg   Height:        Intake/Output Summary (Last 24 hours) at 07/26/2023 1014 Last data filed at 07/26/2023 0700 Gross per 24 hour  Intake 608.59 ml  Output 1475 ml  Net -866.41 ml      07/26/2023    4:23 AM 07/25/2023    4:23 AM 07/25/2023   12:43 AM  Last 3 Weights  Weight (lbs) 229 lb 8 oz 226 lb 3.1 oz 226 lb 3.1 oz  Weight (kg) 104.1 kg 102.6 kg 102.6 kg      Telemetry    Sinus rhythm-Personally Reviewed  Physical Exam   GEN: No acute distress.  Resting but has mild  wob Neck: +JVD  Cardiac: RRR, no murmurs, rubs, or gallops.  Respiratory: on [place], improved air movement GI: Soft, nontender, non-distended  MS: Bilateral LE edema; No deformity. Neuro:  Nonfocal  Psych: Normal affect   New pertinent results (labs, ECG, imaging, cardiac studies)    TTE 07/23/2023 1. Left ventricular ejection fraction, by estimation, is 65 to 70%. The  left ventricle has normal function. The left ventricle has no regional  wall motion abnormalities. There is moderate left ventricular hypertrophy.  Left ventricular diastolic  parameters are indeterminate.   2. Right ventricular systolic function is normal. The right ventricular  size is normal.   3. Left atrial size was moderately dilated.   4. The mitral valve is abnormal. Trivial mitral valve regurgitation. No  evidence of mitral stenosis.   5. The aortic valve is tricuspid. There is mild calcification of the  aortic valve. Aortic valve regurgitation is not visualized. Aortic valve  sclerosis is present, with no evidence of aortic valve stenosis.   6. The inferior vena cava is normal in size with greater than 50%  respiratory variability, suggesting right atrial pressure of 3 mmHg.    CT chest abd/pelvis 1. No aneurysm identified. 2. Moderate  left and small right pleural effusions with compressiveatelectasis of the left lower lobe and portion of the left upper lobe. 3. Cardiomegaly. 4. Mild bilateral perinephric fat stranding. Correlate clinically for pyelonephritis. 5. Mild diffuse body wall edema. 6. Bilateral renal cysts. No follow-up imaging recommended. 7. Sigmoid colon diverticulosis. 8. Prostatomegaly. 9. Aortic atherosclerosis.  Patient Profile     72 y.o. male with a hx of HTN, HLD, CKD III, gout, obesity who is being seen for the evaluation of bradycardia and heart failure.   Assessment & Plan    Acute hypoxic and hypercapnic respiratory failure Acute on chronic diastolic heart failure Acute  kidney injury on chronic kidney disease stage 3b/4-cardiorenal and urinary obstruction -Creatinine is steadily increasing; he was found to have urinary obstruction; managed by urology.;  Nephrology has been consulted despite addressing urinary retention issues he continues to have renal failure Clinically has some improvement in terms of his mental status. 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  If no other changes cardiology will sign off   Time Spent Directly with Patient:  I have spent a total of 35 minutes with the patient reviewing hospital notes, telemetry, EKGs, labs and examining the patient as well as establishing an assessment and plan that was discussed personally with the patient.       Signed, Maisie Fus, MD  07/26/2023, 10:14 AM

## 2023-07-26 NOTE — Progress Notes (Signed)
   07/26/23 2013  BiPAP/CPAP/SIPAP  BiPAP/CPAP/SIPAP Pt Type Adult  BiPAP/CPAP/SIPAP V60  Mask Type Full face mask  Mask Size Extra large  Set Rate 20 breaths/min  Respiratory Rate 23 breaths/min  IPAP 20 cmH20  EPAP 8 cmH2O  FiO2 (%) 35 %  Minute Ventilation 13.3  Leak 1  Peak Inspiratory Pressure (PIP) 20  Tidal Volume (Vt) 460  Patient Home Equipment No  Auto Titrate No  Press High Alarm 25 cmH2O  Press Low Alarm 5 cmH2O  BiPAP/CPAP /SiPAP Vitals  Resp (!) 25  MEWS Score/Color  MEWS Score 1  MEWS Score Color Andrew Schmitt

## 2023-07-26 NOTE — Procedures (Signed)
Thoracentesis  Procedure Note  Andrew Schmitt  [id number]  24-Jul-1951  Date:07/26/23  Time:2:34 PM   Provider Performing:Andrew Schmitt D Andrew Schmitt   Procedure: Thoracentesis with imaging guidance (84132)  Indication(s) Pleural Effusion  Consent Risks of the procedure as well as the alternatives and risks of each were explained to the patient and/or caregiver.  Consent for the procedure was obtained and is signed in the bedside chart  Anesthesia Topical only with 1% lidocaine    Time Out Verified patient identification, verified procedure, site/side was marked, verified correct patient position, special equipment/implants available, medications/allergies/relevant history reviewed, required imaging and test results available.   Sterile Technique Maximal sterile technique including full sterile barrier drape, hand hygiene, sterile gown, sterile gloves, mask, hair covering, sterile ultrasound probe cover (if used).  Procedure Description Ultrasound was used to identify appropriate pleural anatomy for placement and overlying skin marked.  Area of drainage cleaned and draped in sterile fashion. Lidocaine was used to anesthetize the skin and subcutaneous tissue.  1100 cc's of yellow/bloody appearing fluid was drained from the left pleural space. Catheter then removed and bandaid applied to site.   Complications/Tolerance None; patient tolerated the procedure well. Chest X-ray is ordered to confirm no post-procedural complication.   EBL Minimal   Specimen(s) Pleural fluid  Andrew Schmitt Andrew Schmitt Andrew Schmitt [facility] 07/26/2023, 2:35 PM  Please see [email or web address] for pager details.  From 7A-7P if no response, please call [phone]. After hours, please call ELink [phone].

## 2023-07-26 NOTE — Progress Notes (Addendum)
Patient ID: Andrew Schmitt, male   DOB: [birth year], 72 y.o.   MRN: [id number] [facility] Progress Note   Assessment/ Plan:   1.  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  We will continue to hold ARB and SGLT2 inhibitor for now.  Response to diuretics diminished yesterday.  Unclear why but possibly related to low blood pressures.  He continues to appear volume overloaded.  BUN and creatinine did rise slightly today but not overtly uremic.  Will continue with diuresis given the concern for cardiorenal syndrome.  We did discuss today if he has poor response or if kidney function continues to worsen dialysis may be necessary. 2.  Acute exacerbation of congestive heart failure: Initially had good response to Lasix but yesterday response decreased.  He continues to appear volume overloaded to me.  His weight is increased today.  Will increase IV Lasix 260 mg 3 times daily and add metolazone 5 mg today. 3.  Encephalopathy: Possibly multifactorial with prominent component associated with his hypercarbia.  Uremia possible but seems unlikely as his mental status has improved despite rising BUN.  Will continue to monitor. 4.  Anemia: Secondary to chronic disease including chronic kidney disease.  IV iron was given 5.  Hypertension: Blood pressure fairly low.  Stopping hydralazine today. 6. Pleural effusion: plan for thora with pulm  Subjective:   Patient about the same today.  Says his left leg feels heavy but otherwise no complaints..  Urine output noted to be diminished yesterday.  Blood pressure somewhat lower as well.   Objective:   BP 106/62 (BP Location: Right Arm)   Pulse 82   Temp 98.4 F (36.9 C) (Oral)   Resp 19   Ht 5\' 6"  (1.676 m)   Wt 104.1 kg   SpO2 98%   BMI 37.04 kg/m   Intake/Output Summary (Last 24 hours) at 07/26/2023  3220 Last data filed at 07/26/2023 0700 Gross per 24 hour  Intake 608.59 ml  Output 1475 ml  Net -866.41 ml   Weight change: 1.5 kg  Physical Exam: Gen: Appears comfortable sitting up in recliner, nad CVS: normal rate, no murmurs Resp: improved aeration, crackles present, mild expiratory wheezing Abd: Soft, obese, nontender, bowel sounds normal Ext: 2+ bilateral pitting lower extremity edema with venous stasis changes  Imaging: DG CHEST PORT 1 VIEW  Result Date: 07/25/2023 CLINICAL DATA:  Respiratory failure.  Pleural effusion EXAM: PORTABLE CHEST 1 VIEW, portable upright COMPARISON:  X-ray 07/22/2023 and older.  CT 07/21/2023. FINDINGS: Persistent large left effusion, similar to previous. Obscuration of the left cardiac border. No pneumothorax. Tiny right effusion. No edema. Decreasing vascular congestion. Enlarged cardiopericardial silhouette. Film is rotated to the left. Overlapping cardiac leads. Degenerative changes along the spine. IMPRESSION: Decreasing vascular congestion. Right effusion appears slightly smaller today. This could be technical. Electronically Signed   By: Karen Kays M.D.   On: 07/25/2023 12:00    Labs: BMET Recent Labs  Lab 07/21/23 1415 07/21/23 1420 07/22/23 0343 07/23/23 0229 07/24/23 0259 07/25/23 0252 07/26/23 0239  NA 135 138 136 139 138 138 137  K 4.5 4.6 4.6 4.7 4.2 4.3 4.6  CL 101  --  101 100 97* 95* 93*  CO2 25  --  26 29 28  32 32  GLUCOSE 128*  --  111* 125* 141* 116* 114*  BUN 63*  --  61*  61* 68* 81* 96*  CREATININE 3.18*  --  3.04* 3.41* 3.65* 4.02* 4.15*  CALCIUM 8.8*  --  8.7* 8.8* 9.1 9.0 9.0   CBC Recent Labs  Lab 07/21/23 1415 07/21/23 1420 07/23/23 2113 07/24/23 0259 07/25/23 0252 07/26/23 0239  WBC 4.3   < > 9.7 9.1 7.0 5.7  NEUTROABS 3.0  --   --   --   --   --   HGB 10.4*   < > 10.1* 9.5* 8.9* 8.7*  HCT 34.1*   < > 34.1* 31.6* 29.8* 29.5*  MCV 96.1   < > 98.3 97.5 97.1 96.1  PLT 132*   < > 104* 88* 80* 85*   < >  = values in this interval not displayed.    Medications:     Chlorhexidine Gluconate Cloth  6 each Topical Daily   feeding supplement  237 mL Oral BID BM   finasteride  5 mg Oral Daily   guaiFENesin  600 mg Oral BID   haloperidol lactate  1 mg Intravenous Once   heparin injection (subcutaneous)  5,000 Units Subcutaneous Q8H   metolazone  5 mg Oral Once   polyethylene glycol  17 g Oral Daily   rosuvastatin  10 mg Oral Daily   tamsulosin  0.4 mg Oral Daily   Darnell Level  07/26/2023, 9:43 AM

## 2023-07-26 NOTE — Progress Notes (Signed)
NAME:  Andrew Schmitt, MRN:  [id number], DOB:  09/26/1950, LOS: 4 ADMISSION DATE:  07/21/2023, CONSULTATION DATE:  07/23/2023 REFERRING MD:  Dr. Jomarie Longs, CHIEF COMPLAINT:  Pulmonary edema    History of Present Illness:  Andrew Schmitt is a 72 year old male with a past medical history significant for diastolic CHF CKD stage IIIa, CAD, HTN, HLD, gout, and obesity who presented to the ED 12/6 for complaints of progressive weakness, shortness of breath, and lower extremity edema.  Workup on arrival consistent with acute exacerbation of CHF.  Patient was admitted per [facility] with cardiology consult.  Overnight 12/6 patient's mentation worsened with more somnolence prompting ABG which revealed hypoxia and hypercapnia.  BiPAP applied.  PCCM consulted this a.m.  Pertinent  Medical History  Diastolic CHF CKD stage IIIa, CAD, HTN, HLD, gout, and obesity   Significant Hospital Events: Including procedures, antibiotic start and stop dates in addition to other pertinent events   12/6 presented with weakness and shortness of breath found to be in acute exasperation CHF with AKI superimposed on CKD stage IIIa 12/7 worsening mentation secondary to hypercapnia in the setting of pulmonary edema, PCCM consulted 12/8 low-grade temp overnight with some hematuria secondary to Foley trauma 12/9 wearing BiPAP overnight , better mentation per nursing  Interim History / Subjective:  Awake and alert sitting on the pocked  Objective   Blood pressure 106/62, pulse 82, temperature 98.4 F (36.9 C), temperature source Oral, resp. rate 19, height 5\' 6"  (1.676 m), weight 104.1 kg, SpO2 98%.    FiO2 (%):  [35 %] 35 %   Intake/Output Summary (Last 24 hours) at 07/26/2023 1005 Last data filed at 07/26/2023 0700 Gross per 24 hour  Intake 608.59 ml  Output 1475 ml  Net -866.41 ml   Filed Weights   07/25/23 0043 07/25/23 0423 07/26/23 0423  Weight: 102.6 kg 102.6 kg 104.1 kg    Examination: Alert and orientated  currently sitting on the bed pan No JVD is noted Lungs are clear intact orientated x 3 Heart sounds are regular regular rate and rhythm Decreased breath sounds bilaterally no increased work of breathing Abdomen soft nontender positive bowel sounds Remedies with 2+ lower extremity edema          Resolved Hospital Problem list     Assessment & Plan:  Acute decompensated HFpEF with volume overload -Most recent echocardiogram June 2024 EF 60-65 with moderate LVH, no WMA and grade 2 diastolic dysfunction Second-degree AV block Essential hypertension Hyperlipidemia Elevated troponin with history of CAD P: Per cardiology Diuresis as tolerated  Acute hypoxic and hypercapnic respiratory failure Pulmonary edema P: BiPAP as needed ABGs reveals compensated hypercarbic respiratory failure Volume removal No chest x-ray today No apparent need for thoracentesis at this time Monitor ammonia level    Acute kidney injury superimposed on CKD stage IIIb concern for component of cardiorenal syndrome -Creatinine in November 2024 2.8 with GFR 27, creatinine 4.02 with GFR 15 as of 12/9 Lab Results  Component Value Date   CREATININE 4.15 (H) 07/26/2023   CREATININE 4.02 (H) 07/25/2023   CREATININE 3.65 (H) 07/24/2023    P: Nephrology is following  Chronic anemia Thrombocytopenia P: UC protocol 7  Protein calorie malnutrition Hypoalbuminemia P: Per primary  Best Practice (right click and "Reselect all SmartList Selections" daily)  Per primary  Critical care time: NA    Steve Dayan Kreis ACNP Acute Care Nurse Practitioner [facility]/Critical [facility] Please consult Amion 07/26/2023, 10:05 AM

## 2023-07-26 NOTE — Progress Notes (Signed)
PROGRESS NOTE    Andrew Schmitt  UUV:[id number] DOB: January 22, 1951 DOA: 07/21/2023 PCP: Dorothyann Peng, MD  [name] with chronic diastolic CHF, hypertension, dyslipidemia, ascending aortic aneurysm, BPH, CKD 3b/4, chronic anemia, gout, obesity presented to the ED with progressive weakness and shortness of breath.  In the ER labs noted hemoglobin of 10.4, platelets 132, creatinine 3.1, BNP 182, troponin 337, 278, CT chest abdomen pelvis with moderate left and small right pleural effusions, mild bilateral perinephric stranding, diffuse abdominal wall edema. -Admitted,  -12/6: started on diuretics, cardiology following, Coude catheter placed for hematuria w/ CLots and increased discomfort before overt retention -12/7 more encephalopathic, placed on BiPAP, very mild respiratory acidosis, pCO2 in the 60-70 range -Complicated by worsening AKI, nephrology consulting, diuretic dose increased   Subjective: -Feels better overall, ate breakfast, used BiPAP overnight  Assessment and Plan:  Acute on chronic diastolic CHF Left pleural effusion -Last echo 6/24 with EF 60-65%, moderate LVH, grade 2 DD, mildly reduced RV -Diuresed on IV Lasix, 8.1L negative, cards and nephrology following -Repeat echo with EF 65-70% moderate LVH, indeterminate diastolic parameters, normal RV -Creatinine slowly worsening, appreciate nephrology input, diuretic dose increased -GDMT limited by AKI/CKD -Pulmonary following, plan for thoracentesis today  Mild encephalopathy -Multifactorial, likely from combination of hypercarbia,, mild uremia -Ammonia level is normal, continue BiPAP for daytime naps and nightly -mental status is improving   Second-degree AV block, Mobitz type I Bradycardia -Heart rate improving.  Carvedilol on hold, cards following   Elevated troponin History of CAD -Suspected to be from demand ischemia, no symptoms of ACS -Continue aspirin, statin, holding BB   AKI on CKD stage IIIb -cardiorenal,  creatinine now 4.1 from baseline of 2-2.5  -Holding losartan -Monitor with diuresis -Foley catheter placed 12/6 for hematuria with clots, ? early retention, has known BPH, CT on admission with bilateral renal cysts as well -Creatinine appears to be plateauing today, per nephrology   Bilateral renal cysts Mild bilateral perinephric fat stranding seen on CT UA not suggestive of infection. -CT findings likely from volume overload, no symptoms of pyelonephritis   BPH Continue Proscar and Flomax.   Chronic anemia and thrombocytopenia Stable, monitor   Gout Hold allopurinol given reduced creatinine clearance.   DVT prophylaxis: heparin subcutaneous for DVT prophylaxis Code Status: Full Code  Family Communication: Wife at bedside Consults : Cardiology, nephrology   Procedures:   Antimicrobials:    Objective: Vitals:   07/26/23 0423 07/26/23 0825 07/26/23 1056 07/26/23 1135  BP: (!) 100/58 106/62  99/63  Pulse: 82 82  84  Resp: 19 19  (!) 21  Temp: 98.3 F (36.8 C) 98.4 F (36.9 C)    TempSrc: Oral Oral    SpO2: 100% 98% 97% 100%  Weight: 104.1 kg     Height:        Intake/Output Summary (Last 24 hours) at 07/26/2023 1209 Last data filed at 07/26/2023 0700 Gross per 24 hour  Intake 608.59 ml  Output 1475 ml  Net -866.41 ml   Filed Weights   07/25/23 0043 07/25/23 0423 07/26/23 0423  Weight: 102.6 kg 102.6 kg 104.1 kg    Examination:  General exam: Obese pleasant male sitting up in recliner, AAO x 3 HEENT: Positive JVD CVS: S1-S2, regular rhythm Lungs: Rare basilar Rales Abdomen: Soft, nontender, bowel sounds present Extremities: 1+ edema  Neuro: Moves all extremities, no localizing signs   Data Reviewed:   CBC: Recent Labs  Lab 07/21/23 1415 07/21/23 1420 07/23/23 0229 07/23/23 2113 07/24/23 0259  07/25/23 0252 07/26/23 0239  WBC 4.3   < > 7.2 9.7 9.1 7.0 5.7  NEUTROABS 3.0  --   --   --   --   --   --   HGB 10.4*   < > 9.5* 10.1* 9.5* 8.9*  8.7*  HCT 34.1*   < > 32.2* 34.1* 31.6* 29.8* 29.5*  MCV 96.1   < > 97.6 98.3 97.5 97.1 96.1  PLT 132*   < > 113* 104* 88* 80* 85*   < > = values in this interval not displayed.   Basic Metabolic Panel: Recent Labs  Lab 07/21/23 1415 07/21/23 1420 07/22/23 0343 07/23/23 0229 07/24/23 0259 07/25/23 0252 07/26/23 0239  NA 135   < > 136 139 138 138 137  K 4.5   < > 4.6 4.7 4.2 4.3 4.6  CL 101  --  101 100 97* 95* 93*  CO2 25  --  26 29 28  32 32  GLUCOSE 128*  --  111* 125* 141* 116* 114*  BUN 63*  --  61* 61* 68* 81* 96*  CREATININE 3.18*  --  3.04* 3.41* 3.65* 4.02* 4.15*  CALCIUM 8.8*  --  8.7* 8.8* 9.1 9.0 9.0  MG 2.9*  --   --   --   --  2.4  --    < > = values in this interval not displayed.   GFR: Estimated Creatinine Clearance: 17.6 mL/min (A) (by C-G formula based on SCr of 4.15 mg/dL (H)). Liver Function Tests: Recent Labs  Lab 07/21/23 1415 07/23/23 0229 07/24/23 0259 07/25/23 0252  AST 30 20 26 28   ALT 32 23 25 27   ALKPHOS 69 61 54 47  BILITOT 0.8 0.8 1.3* 1.3*  PROT 7.5 7.1 7.6 7.3  ALBUMIN 3.1* 2.9* 3.6 3.2*   No results for input(s): "LIPASE", "AMYLASE" in the last 168 hours. Recent Labs  Lab 07/22/23 0900 07/22/23 2221  AMMONIA 35 36*   Coagulation Profile: No results for input(s): "INR", "PROTIME" in the last 168 hours. Cardiac Enzymes: No results for input(s): "CKTOTAL", "CKMB", "CKMBINDEX", "TROPONINI" in the last 168 hours. BNP (last 3 results) No results for input(s): "PROBNP" in the last 8760 hours. HbA1C: No results for input(s): "HGBA1C" in the last 72 hours. CBG: Recent Labs  Lab 07/22/23 1513  GLUCAP 134*   Lipid Profile: No results for input(s): "CHOL", "HDL", "LDLCALC", "TRIG", "CHOLHDL", "LDLDIRECT" in the last 72 hours. Thyroid Function Tests: No results for input(s): "TSH", "T4TOTAL", "FREET4", "T3FREE", "THYROIDAB" in the last 72 hours.  Anemia Panel: No results for input(s): "VITAMINB12", "FOLATE", "FERRITIN", "TIBC",  "IRON", "RETICCTPCT" in the last 72 hours. Urine analysis:    Component Value Date/Time   COLORURINE STRAW (A) 07/23/2023 2150   APPEARANCEUR CLEAR 07/23/2023 2150   LABSPEC 1.006 07/23/2023 2150   PHURINE 5.0 07/23/2023 2150   GLUCOSEU 50 (A) 07/23/2023 2150   HGBUR MODERATE (A) 07/23/2023 2150   BILIRUBINUR NEGATIVE 07/23/2023 2150   BILIRUBINUR negative 04/09/2021 0954   KETONESUR NEGATIVE 07/23/2023 2150   PROTEINUR 30 (A) 07/23/2023 2150   UROBILINOGEN 0.2 04/09/2021 0954   NITRITE NEGATIVE 07/23/2023 2150   LEUKOCYTESUR NEGATIVE 07/23/2023 2150   Sepsis Labs: @LABRCNTIP (procalcitonin:4,lacticidven:4)  ) Recent Results (from the past 240 hour(s))  Culture, blood (Routine X 2) w Reflex to ID Panel     Status: None (Preliminary result)   Collection Time: 07/23/23  9:12 PM   Specimen: BLOOD RIGHT HAND  Result Value Ref Range Status  Specimen Description BLOOD RIGHT HAND  Final   Special Requests   Final    BOTTLES DRAWN AEROBIC AND ANAEROBIC Blood Culture results may not be optimal due to an excessive volume of blood received in culture bottles   Culture   Final    NO GROWTH 3 DAYS Performed at [facility], [address] [place], [place], [place] [address]    Report Status PENDING  Incomplete  Culture, blood (Routine X 2) w Reflex to ID Panel     Status: None (Preliminary result)   Collection Time: 07/23/23  9:12 PM   Specimen: BLOOD  Result Value Ref Range Status   Specimen Description BLOOD LEFT ANTECUBITAL  Final   Special Requests   Final    BOTTLES DRAWN AEROBIC AND ANAEROBIC Blood Culture adequate volume   Culture   Final    NO GROWTH 3 DAYS Performed at [facility], [address], [place], [place] [address]    Report Status PENDING  Incomplete  Urine Culture (for pregnant, neutropenic or urologic patients or patients with an indwelling urinary catheter)     Status: None   Collection Time: 07/23/23  9:50 PM   Specimen: Urine, Catheterized  Result  Value Ref Range Status   Specimen Description URINE, CATHETERIZED  Final   Special Requests NONE  Final   Culture   Final    NO GROWTH Performed at [facility], [address], [place], [place] [address]    Report Status 07/25/2023 FINAL  Final     Radiology Studies: DG CHEST PORT 1 VIEW  Result Date: 07/25/2023 CLINICAL DATA:  Respiratory failure.  Pleural effusion EXAM: PORTABLE CHEST 1 VIEW, portable upright COMPARISON:  X-ray 07/22/2023 and older.  CT 07/21/2023. FINDINGS: Persistent large left effusion, similar to previous. Obscuration of the left cardiac border. No pneumothorax. Tiny right effusion. No edema. Decreasing vascular congestion. Enlarged cardiopericardial silhouette. Film is rotated to the left. Overlapping cardiac leads. Degenerative changes along the spine. IMPRESSION: Decreasing vascular congestion. Right effusion appears slightly smaller today. This could be technical. Electronically Signed   By: Karen Kays M.D.   On: 07/25/2023 12:00     Scheduled Meds:  Chlorhexidine Gluconate Cloth  6 each Topical Daily   feeding supplement  237 mL Oral BID BM   finasteride  5 mg Oral Daily   guaiFENesin  600 mg Oral BID   haloperidol lactate  1 mg Intravenous Once   heparin injection (subcutaneous)  5,000 Units Subcutaneous Q8H   polyethylene glycol  17 g Oral Daily   rosuvastatin  10 mg Oral Daily   tamsulosin  0.4 mg Oral Daily   Continuous Infusions:  furosemide        LOS: 4 days    Time spent:    Zannie Cove, MD [facility]   07/26/2023, 12:09 PM

## 2023-07-26 NOTE — Progress Notes (Signed)
Mobility Specialist Progress Note:   07/26/23 1201  Mobility  Activity Transferred to/from [facility]  Level of Assistance Moderate assist, patient does 50-74% (+2)  Assistive Device Other (Comment) (HHA)  Distance Ambulated (ft) 3 ft  Activity Response Tolerated well  Mobility Referral Yes  Mobility visit 1 Mobility  Mobility Specialist Start Time (ACUTE ONLY) [id number]  Mobility Specialist Stop Time (ACUTE ONLY) 0956  Mobility Specialist Time Calculation (min) (ACUTE ONLY) 14 min   Pt received on BSC, NT requesting assistance getting patient back to chair. Pt needed ModA w/ HHA to get from the [employer] to the chair. Was able to take a couple shuffle steps towards the chair. No c/o throughout. Situated in chair w/ call bell and persoanl belongings in reach. All needs met. NT in room.   Thompson Grayer Mobility Specialist  Please contact vis Secure Chat or  Rehab Office [phone]

## 2023-07-26 NOTE — Plan of Care (Signed)

## 2023-07-27 DIAGNOSIS — N401 Enlarged prostate with lower urinary tract symptoms: Secondary | ICD-10-CM | POA: Diagnosis not present

## 2023-07-27 DIAGNOSIS — J9602 Acute respiratory failure with hypercapnia: Secondary | ICD-10-CM | POA: Diagnosis not present

## 2023-07-27 DIAGNOSIS — J90 Pleural effusion, not elsewhere classified: Secondary | ICD-10-CM | POA: Diagnosis not present

## 2023-07-27 DIAGNOSIS — I5033 Acute on chronic diastolic (congestive) heart failure: Secondary | ICD-10-CM | POA: Diagnosis not present

## 2023-07-27 DIAGNOSIS — J9601 Acute respiratory failure with hypoxia: Secondary | ICD-10-CM | POA: Diagnosis not present

## 2023-07-27 DIAGNOSIS — I441 Atrioventricular block, second degree: Secondary | ICD-10-CM | POA: Diagnosis not present

## 2023-07-27 DIAGNOSIS — N400 Enlarged prostate without lower urinary tract symptoms: Secondary | ICD-10-CM

## 2023-07-27 DIAGNOSIS — E66812 Obesity, class 2: Secondary | ICD-10-CM

## 2023-07-27 DIAGNOSIS — N179 Acute kidney failure, unspecified: Secondary | ICD-10-CM | POA: Diagnosis not present

## 2023-07-27 LAB — BASIC METABOLIC PANEL
Anion gap: 11 (ref 5–15)
BUN: 109 mg/dL — ABNORMAL HIGH (ref 8–23)
CO2: 31 mmol/L (ref 22–32)
Calcium: 8.7 mg/dL — ABNORMAL LOW (ref 8.9–10.3)
Chloride: 91 mmol/L — ABNORMAL LOW (ref 98–111)
Creatinine, Ser: 3.77 mg/dL — ABNORMAL HIGH (ref 0.61–1.24)
GFR, Estimated: 16 mL/min — ABNORMAL LOW (ref >60)
Glucose, Bld: 120 mg/dL — ABNORMAL HIGH (ref 70–99)
Potassium: 4.2 mmol/L (ref 3.5–5.1)
Sodium: 133 mmol/L — ABNORMAL LOW (ref 135–145)

## 2023-07-27 LAB — TRIGLYCERIDES, BODY FLUIDS: Triglycerides, Fluid: 25 mg/dL

## 2023-07-27 LAB — CBC
HCT: 26.8 % — ABNORMAL LOW (ref 39.0–52.0)
Hemoglobin: 8.3 g/dL — ABNORMAL LOW (ref 13.0–17.0)
MCH: 29.2 pg (ref 26.0–34.0)
MCHC: 31 g/dL (ref 30.0–36.0)
MCV: 94.4 fL (ref 80.0–100.0)
Platelets: 96 10*3/uL — ABNORMAL LOW (ref 150–400)
RBC: 2.84 MIL/uL — ABNORMAL LOW (ref 4.22–5.81)
RDW: 14.1 % (ref 11.5–15.5)
WBC: 5.5 10*3/uL (ref 4.0–10.5)
nRBC: 0 % (ref 0.0–0.2)

## 2023-07-27 MED ORDER — OXYBUTYNIN CHLORIDE 5 MG PO TABS
5.0000 mg | ORAL_TABLET | Freq: Once | ORAL | Status: AC | PRN
  Administered 2023-07-27: 5 mg via ORAL
  Filled 2023-07-27: qty 1

## 2023-07-27 MED ORDER — METOLAZONE 5 MG PO TABS
5.0000 mg | ORAL_TABLET | Freq: Once | ORAL | Status: AC
  Administered 2023-07-27: 5 mg via ORAL
  Filled 2023-07-27: qty 1

## 2023-07-27 NOTE — Assessment & Plan Note (Addendum)
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 Hyponatremia,.  Patient with progressive azotemia and uremia, required renal replacement therapy.  12/20 right internal jugular temporal catheter,  12/23 tunneled HD cathter  Continue renal replacement therapy TTS.  Pending outpatient CLIP.    Continue with renal diet and P binders  Patient had traumatic hematuria, required CBI.  Follow up with urology recommendations.   Anemia of chronic renal disease, 12/23 PRBC transfusion x1

## 2023-07-27 NOTE — Plan of Care (Signed)

## 2023-07-27 NOTE — Assessment & Plan Note (Addendum)
Echocardiogram with preserved LV systolic function with EF 65 to 70%, moderate LVH. RV systolic function preserved, LA with moderate dilatation, no significant valvular disease.    Patient failed diuretic therapy and has been transitioned to renal replacement therapy with ultrafiltration with good toleration.  His volume status has improved.   Acute hypoxemic hyercapnic respiratory failure, due to acute cardiogenic pulmonary edema, left pleural effusion, complicated with acute metabolic encephalopathy.  12/10 SP left thoracentesis 1100 ml removed.   Metabolic encephalopathy has resolved.

## 2023-07-27 NOTE — Progress Notes (Addendum)
Progress Note   Patient: Andrew Schmitt ZOX:[id number] DOB: September 29, 1950 DOA: 07/21/2023     5 DOS: the patient was seen and examined on 07/27/2023   Brief hospital course: Mr. Deboer was admitted to the hospital with the working diagnosis of acute heart failure decompensation.   [name] with chronic diastolic CHF, hypertension, dyslipidemia, ascending aortic aneurysm, BPH, CKD 3b/4, chronic anemia, gout, obesity presented to the ED with progressive weakness and shortness of breath.  In the ER labs noted hemoglobin of 10.4, platelets 132, creatinine 3.1, BNP 182, troponin 337, 278, CT chest abdomen pelvis with moderate left and small right pleural effusions, mild bilateral perinephric stranding, diffuse abdominal wall edema. -Admitted,  -12/6: started on diuretics, cardiology following, Coude catheter placed for hematuria w/ CLots and increased discomfort before overt retention -12/7 more encephalopathic, placed on BiPAP, very mild respiratory acidosis, pCO2 in the 60-70 range -Complicated by worsening AKI, nephrology consulting, diuretic dose increased  Assessment and Plan: * Acute on chronic diastolic CHF (congestive heart failure) (HCC) Echocardiogram with preserved LV systolic function with EF 65 to 70%, moderate LVH. RV systolic function preserved, LA with moderate dilatation, no significant valvular disease.   Urine output is 3,150 ml Systolic blood pressure 109 to 110 mmHg.   Plan to continue diuresis with furosemide 160 mg tid  Limited further guideline directed medical therapy due to low GFR.  One dose of metolazone 5 mg today.  Acute hypoxemic hyercapnic respiratory failure, due to acute cardiogenic pulmonary edema, left pleural effusion, complicated with acute metabolic encephalopathy.  SP left thoracentesis  Patient with improvement in oxygenation, with 02 saturation  98% on 2 L/min per [place]. Will continue Bipap at night and as needed during the day. Metabolic encephalopathy now  has resolved.    Acute kidney injury superimposed on chronic kidney disease (HCC) CKD stage 3b.   Renal function with serum cr is 3,7 with K at 4.2 and serum bicarbonate at 31. Na 133.   Plan to continue diuresis with furosemide, follow up renal function and electrolytes.  12/11 metolazone 5 mg   Bilateral renal cysts. Urinary tract infection has been ruled out.   Anemia of chronic renal disease. Chronic thrombocytopenia.   Second degree AV block, Mobitz type I Continue to hold on AV blockers. Continue telemetry.  BPH (benign prostatic hyperplasia) Continue with flomax and proscar  Foley catheter in place.   Obesity, class 2 Calculated BMI is 37,4        Subjective: Patient is feeling better, dyspnea has improved, and today he is off Bipap. No chest pain and tolerating po well.   Physical Exam: Vitals:   07/27/23 0218 07/27/23 0403 07/27/23 0745 07/27/23 0946  BP:  108/63 109/62   Pulse: 79 77 (!) 59 78  Resp:  19 (!) 21 18  Temp:  99 F (37.2 C)    TempSrc:  Oral    SpO2: 97% 95% 95% 98%  Weight:  105.3 kg    Height:       Neurology awake and alert ENT with mild pallor Cardiovascular with S1 and S2 present and regular with no gallops, rubs or murmurs Respiratory with mild rales at bases with no wheezing  Abdomen with no distention Positive lower extremity edema.  Data Reviewed:    Family Communication: I spoke with patient's son and daughter (over the phone) at the bedside, we talked in detail about patient's condition, plan of care and prognosis and all questions were addressed.   Disposition: Status is: Inpatient  Remains inpatient appropriate because: IV diuresis   Planned Discharge Destination: Home    Author: Coralie Keens, MD 07/27/2023 1:31 PM  For on call review [email or web address].

## 2023-07-27 NOTE — Hospital Course (Addendum)
Mr. Blais was admitted to the hospital with the working diagnosis of acute heart failure decompensation.   [name] with chronic diastolic CHF, hypertension, dyslipidemia, ascending aortic aneurysm, BPH, CKD 3b/4, chronic anemia, gout, obesity presented to the ED with progressive weakness and shortness of breath for 7 days prior to admission. Worsening dyspnea on exertion, and lower extremity edema. Severe symptoms to the point where he was dyspneic with minimal efforts. EMS was called, he was found lethargic and bradycardic, he was placed on supplemental 02 per non re-breather mask and was transported to the ED.  On his initial ED physical examination his blood pressure was 118/66, HR 65, RR 22 and 02 saturation 100% on supplemental 02. Lungs with no wheezing or rhonchi, increased work of breathing, heart with S1 and S2 present and regular, with no gallops, or rubs, abdomen with no distention and positive lower extremity edema   Na 135, K 4.5 Cl 101, bicarbonate 25, glucose 128, bun 63, cr 3,18  Mg 2.9  BNP 183  High sensitive troponin 337, 278, 309 Wbc 4.3 hgb 10.4 plt 132  Urine analysis SG 1.009. protein 100, negative leukocytes, moderate hgb, RBC >50, wbc 6-10   Chest radiograph with cardiomegaly, bilateral hilar vascular congestion, bilateral pleural effusions, moderate to large on the left and small on the right.  CT chest, abdomen and pelvis with no aneurysm identified, moderate left pleural effusion and small right pleural effusion with compressive atelectasis on the left lower lobe and portion of the left upper lobe.  Mild bilateral perinephric stranding.  Mild diffuse body wall edema.  Bilateral renal cysts.  EKG 59 bpm, right axis deviation, qtc 510, sinus rhythm with first degree AV block, one non conducting P wave, poor R R wave progression, with no significant ST segment or T wave changes.    -12/6: started on diuretics, Coude catheter placed for hematuria w/ CLots and increased  discomfort before overt retention -12/7 more encephalopathic, placed on BiPAP, very mild respiratory acidosis, pCO2 in the 60-70 range -Complicated by worsening AKI, nephrology consulting, diuretic dose increased  12/13 improved volume status, but renal function not yet stable.  12/14 persistent elevation in BUN.  12/15 resumed diuretic therapy  12/16 improved volume status and transitioned to oral loop diuretic therapy.  12/17 holding diuretic therapy, continue to have severe BUM elevation, may need renal replacement therapy.  12/18: Gross hematuria with clots after tugging at catheter same morning, Urology consulted, started on continuous bladder irrigation, Foley replaced -12/19, BUN 200 with uremia, temp HD catheter placed, -12/21, CBI clamped -12/22, noted to be in A-fib asymptomatic -12/22 overnight CBI resumed for hematuria -12/23, transfused 1 unit PRBC on dialysis 12/24, CBI clamped, going for tunneled dialysis catheter

## 2023-07-27 NOTE — Progress Notes (Signed)
Patient ID: Andrew Schmitt, male   DOB: October 08, 1950, 72 y.o.   MRN: [id number] [facility] Progress Note   Assessment/ Plan:   1.  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  We will continue to hold ARB and SGLT2 inhibitor for now.  Response to diuretics diminished 12/9.  Unclear why but possibly related to low blood pressures.  He continues to appear volume overloaded to me but I do think he is improving; however his weight is again up today but that is hard to believe. I asked nurse to repeat a standing weight. Crt improved but BUN up. Tired but hard to say if he has definitive uremia. Discussed with daughter that we will continue with same regimen of diuresis (lasix and metolazone) today and consider pausing tomorrow based on response. We discussed if kidney function continues to worsen dialysis may be necessary. 2.  Acute exacerbation of congestive heart failure: Initially had good response to Lasix but yesterday response decreased.  He continues to appear volume overloaded to me.  His weight is increased today although we plan to repeat this measurement. Continue IV Lasix to 160 mg 3 times daily and add metolazone 5 mg today. 3.  Encephalopathy: Possibly multifactorial with prominent component associated with his hypercarbia.  Uremia possible but seems unlikely as his mental status has improved despite rising BUN.  Will continue to monitor. 4.  Anemia: Secondary to chronic disease including chronic kidney disease.  IV iron was given 5.  Hypertension: Blood pressure fairly low.  Stopping hydralazine today. 6. Pleural effusion: plan for thora with pulm  Subjective:   Patient resting today.  Difficult to wake up to answer questions.  Discussed care with the patient's son and daughter.  Son feels like the patient was more awake and alert  yesterday.  Daughter worried that the patient has been more tired in general.  No other complaints.  Good urine output.  Creatinine slightly improved but BUN increased   Objective:   BP 109/62 (BP Location: Left Arm)   Pulse 78   Temp 99 F (37.2 C) (Oral)   Resp 18   Ht 5\' 6"  (1.676 m)   Wt 105.3 kg   SpO2 98%   BMI 37.47 kg/m   Intake/Output Summary (Last 24 hours) at 07/27/2023 1047 Last data filed at 07/27/2023 0403 Gross per 24 hour  Intake 612.1 ml  Output 3150 ml  Net -2537.9 ml   Weight change: 0 kg  Physical Exam: Gen: Appears comfortable sitting up in recliner, nad, resting CVS: normal rate, no murmurs Resp: improved aeration, crackles present, mild expiratory wheezing Abd: Soft, obese, nontender, bowel sounds normal Ext: 1-2+ bilateral pitting lower extremity edema with venous stasis changes  Imaging: DG CHEST PORT 1 VIEW  Result Date: 07/26/2023 CLINICAL DATA:  Post thoracentesis EXAM: PORTABLE CHEST 1 VIEW COMPARISON:  07/25/2023 FINDINGS: Decreasing left pleural effusion following thoracentesis. No pneumothorax. Small bilateral pleural effusions with bibasilar opacities, likely atelectasis. Cardiomegaly. No overt edema. IMPRESSION: Decreasing left effusion following thoracentesis.  No pneumothorax. Small bilateral effusions with bibasilar atelectasis. Cardiomegaly. Electronically Signed   By: Charlett Nose M.D.   On: 07/26/2023 20:05    Labs: BMET Recent Labs  Lab 07/21/23 1415 07/21/23 1420 07/22/23 0343 07/23/23 0229 07/24/23 0259 07/25/23 0252 07/26/23 0239 07/27/23 0256  NA 135 138 136 139 138 138 137 133*  K 4.5  4.6 4.6 4.7 4.2 4.3 4.6 4.2  CL 101  --  101 100 97* 95* 93* 91*  CO2 25  --  26 29 28  32 32 31  GLUCOSE 128*  --  111* 125* 141* 116* 114* 120*  BUN 63*  --  61* 61* 68* 81* 96* 109*  CREATININE 3.18*  --  3.04* 3.41* 3.65* 4.02* 4.15* 3.77*  CALCIUM 8.8*  --  8.7* 8.8* 9.1 9.0 9.0 8.7*   CBC Recent Labs  Lab 07/21/23 1415  07/21/23 1420 07/24/23 0259 07/25/23 0252 07/26/23 0239 07/27/23 0256  WBC 4.3   < > 9.1 7.0 5.7 5.5  NEUTROABS 3.0  --   --   --   --   --   HGB 10.4*   < > 9.5* 8.9* 8.7* 8.3*  HCT 34.1*   < > 31.6* 29.8* 29.5* 26.8*  MCV 96.1   < > 97.5 97.1 96.1 94.4  PLT 132*   < > 88* 80* 85* 96*   < > = values in this interval not displayed.    Medications:     Chlorhexidine Gluconate Cloth  6 each Topical Daily   feeding supplement  237 mL Oral BID BM   finasteride  5 mg Oral Daily   guaiFENesin  600 mg Oral BID   haloperidol lactate  1 mg Intravenous Once   heparin injection (subcutaneous)  5,000 Units Subcutaneous Q8H   polyethylene glycol  17 g Oral Daily   rosuvastatin  10 mg Oral Daily   tamsulosin  0.4 mg Oral Daily   Darnell Level  07/27/2023, 10:47 AM

## 2023-07-27 NOTE — Assessment & Plan Note (Addendum)
Continue with flomax and proscar  Bladder catheter in place.

## 2023-07-27 NOTE — Assessment & Plan Note (Signed)
Calculated BMI is 37,4

## 2023-07-27 NOTE — Assessment & Plan Note (Signed)
Continue to hold on AV blockers. Continue telemetry.

## 2023-07-27 NOTE — Progress Notes (Addendum)
NAME:  KENDLE BRUSTER, MRN:  [id number], DOB:  [birth year], LOS: 5 ADMISSION DATE:  07/21/2023, CONSULTATION DATE:  07/23/2023 REFERRING MD:  Dr. Jomarie Longs, CHIEF COMPLAINT:  Pulmonary edema    History of Present Illness:  JUSTO MOWBRAY is a 72 year old male with a past medical history significant for diastolic CHF CKD stage IIIa, CAD, HTN, HLD, gout, and obesity who presented to the ED 12/6 for complaints of progressive weakness, shortness of breath, and lower extremity edema.  Workup on arrival consistent with acute exacerbation of CHF.  Patient was admitted per [facility] with cardiology consult.  Overnight 12/6 patient's mentation worsened with more somnolence prompting ABG which revealed hypoxia and hypercapnia.  BiPAP applied.  PCCM consulted this a.m.  Pertinent  Medical History  Diastolic CHF CKD stage IIIa, CAD, HTN, HLD, gout, and obesity   Significant Hospital Events: Including procedures, antibiotic start and stop dates in addition to other pertinent events   12/6 presented with weakness and shortness of breath found to be in acute exasperation CHF with AKI superimposed on CKD stage IIIa 12/7 worsening mentation secondary to hypercapnia in the setting of pulmonary edema, PCCM consulted 12/8 low-grade temp overnight with some hematuria secondary to Foley trauma 12/9 wearing BiPAP overnight , better mentation per nursing 12/10 left thora- 1100cc removed, bloody-appearing  Interim History / Subjective:  No complaints today. Breathing well.   Objective   Blood pressure 109/62, pulse 78, temperature 99 F (37.2 C), temperature source Oral, resp. rate 18, height 5\' 6"  (1.676 m), weight 105.3 kg, SpO2 98%.    FiO2 (%):  [35 %] 35 %   Intake/Output Summary (Last 24 hours) at 07/27/2023 1155 Last data filed at 07/27/2023 0403 Gross per 24 hour  Intake 612.1 ml  Output 3150 ml  Net -2537.9 ml   Filed Weights   07/26/23 0423 07/26/23 1347 07/27/23 0403  Weight: 104.1 kg 104.1 kg  105.3 kg    Examination: Chronically ill appearing man sitting up in the recliner More awake today, interactive. Answering questions S1S2, RRR Breathing comfortably on Terminous, increased breath sounds on the left. Decreased left basilar breath sounds. Abd obese, soft, NT +edema, unna boots  BUN 109 Cr 3.77 WBC 5.5   Cell count with differential: 2566, 97 % lymphs Protein 4.6g LD126 Glucose 137 CXR 12/10 personally reviewed: smaller effusion, moderate persistent effusion, no pneumothorax     Resolved Hospital Problem list     Assessment & Plan:  Acute decompensated HFpEF with volume overload Second-degree AV block Essential hypertension Hyperlipidemia Elevated troponin with history of CAD -diuresis -tele monitoring -prevent hypertension -agree with unna boots  Acute hypoxic and hypercapnic respiratory failure Acute pulmonary edema L>R pleural effusions; R resolved with diuresis and left not resolving, confirmed to be exudative on thoracentesis. -follow cytology & cultures -adding flow cytometry due to lymphocyte predominance; no adenopathy notable on previous CT scan -con't diuresis -Repeat CXR in AM to evaluate for rate of reaccumulation of left effusion. -If cytology is negative on first sample, would drain left side again for repeat cyto. First cyto has ~60% yield.  Acute kidney injury on CKD stage IIIb; concern for component of cardiorenal syndrome -strict I/O -cautious diuresis -appreciate Nephrology's management  Chronic anemia Thrombocytopenia -per primary  Protein calorie malnutrition Hypoalbuminemia -per primary  Daughter who is physician updated on speaker phone with brother and SIL bedside during rounds this morning.   PCCM will con't to follow left effusion.   Best Practice (right click and "Reselect all SmartList  Selections" daily)  Per primary  Critical care time: NA    Steffanie Dunn, DO 07/27/23 12:03 PM [facility]  For contact information, see Amion. If no response to pager, please call PCCM consult pager. After hours, 7PM- 7AM, please call Elink.

## 2023-07-28 ENCOUNTER — Inpatient Hospital Stay (HOSPITAL_COMMUNITY): Payer: Medicare PPO

## 2023-07-28 ENCOUNTER — Encounter (HOSPITAL_COMMUNITY): Payer: Self-pay | Admitting: Pulmonary Disease

## 2023-07-28 DIAGNOSIS — I5033 Acute on chronic diastolic (congestive) heart failure: Secondary | ICD-10-CM | POA: Diagnosis not present

## 2023-07-28 DIAGNOSIS — N179 Acute kidney failure, unspecified: Secondary | ICD-10-CM | POA: Diagnosis not present

## 2023-07-28 DIAGNOSIS — N401 Enlarged prostate with lower urinary tract symptoms: Secondary | ICD-10-CM | POA: Diagnosis not present

## 2023-07-28 DIAGNOSIS — I441 Atrioventricular block, second degree: Secondary | ICD-10-CM | POA: Diagnosis not present

## 2023-07-28 LAB — CULTURE, BLOOD (ROUTINE X 2)
Culture: NO GROWTH
Culture: NO GROWTH
Special Requests: ADEQUATE

## 2023-07-28 LAB — BASIC METABOLIC PANEL
Anion gap: 15 (ref 5–15)
BUN: 137 mg/dL — ABNORMAL HIGH (ref 8–23)
CO2: 33 mmol/L — ABNORMAL HIGH (ref 22–32)
Calcium: 9.1 mg/dL (ref 8.9–10.3)
Chloride: 87 mmol/L — ABNORMAL LOW (ref 98–111)
Creatinine, Ser: 3.73 mg/dL — ABNORMAL HIGH (ref 0.61–1.24)
GFR, Estimated: 16 mL/min — ABNORMAL LOW (ref >60)
Glucose, Bld: 137 mg/dL — ABNORMAL HIGH (ref 70–99)
Potassium: 4.1 mmol/L (ref 3.5–5.1)
Sodium: 135 mmol/L (ref 135–145)

## 2023-07-28 LAB — MAGNESIUM: Magnesium: 2.4 mg/dL (ref 1.7–2.4)

## 2023-07-28 NOTE — Progress Notes (Signed)
Mobility Specialist Progress Note:    07/28/23 1115  Mobility  Activity Stood at bedside;Ambulated with assistance in hallway  Level of Assistance Minimal assist, patient does 75% or more (+2)  Assistive Device Other (Comment) (HHA)  Distance Ambulated (ft) 6 ft (3 steps forwards and 3 steps backwards)  Activity Response Tolerated well  Mobility Referral Yes  Mobility visit 1 Mobility  Mobility Specialist Start Time (ACUTE ONLY) [id number]  Mobility Specialist Stop Time (ACUTE ONLY) [id number]  Mobility Specialist Time Calculation (min) (ACUTE ONLY) 16 min   Pt received in bed agreeable to mobility. Nursing staff needed a weight on the patient so he was able to take 3 steps forward onto the scale and three steps backwards with MinA +2 and HHA. No c/o throughout. Returned to seated position. Call bell and personal belongings in reach. All needs met. Family in room.  Thompson Grayer Mobility Specialist  Please contact vis Secure Chat or  Rehab Office [phone]

## 2023-07-28 NOTE — Progress Notes (Signed)
Progress Note   Patient: Andrew Schmitt MWU:[id number] DOB: Mar 08, 1951 DOA: 07/21/2023     6 DOS: the patient was seen and examined on 07/28/2023   Brief hospital course: Andrew Schmitt was admitted to the hospital with the working diagnosis of acute heart failure decompensation.   [name] with chronic diastolic CHF, hypertension, dyslipidemia, ascending aortic aneurysm, BPH, CKD 3b/4, chronic anemia, gout, obesity presented to the ED with progressive weakness and shortness of breath.  In the ER labs noted hemoglobin of 10.4, platelets 132, creatinine 3.1, BNP 182, troponin 337, 278, CT chest abdomen pelvis with moderate left and small right pleural effusions, mild bilateral perinephric stranding, diffuse abdominal wall edema. -Admitted,  -12/6: started on diuretics, cardiology following, Coude catheter placed for hematuria w/ CLots and increased discomfort before overt retention -12/7 more encephalopathic, placed on BiPAP, very mild respiratory acidosis, pCO2 in the 60-70 range -Complicated by worsening AKI, nephrology consulting, diuretic dose increased  Assessment and Plan: * Acute on chronic diastolic CHF (congestive heart failure) (HCC) Echocardiogram with preserved LV systolic function with EF 65 to 70%, moderate LVH. RV systolic function preserved, LA with moderate dilatation, no significant valvular disease.   Urine output is 3,050 ml Systolic blood pressure 110 mmHg.   Plan for transition to oral loop diuretic in the next 24 hrs.  Limited further guideline directed medical therapy due to low GFR.  12/11 metolazone 5 mg today.  Acute hypoxemic hyercapnic respiratory failure, due to acute cardiogenic pulmonary edema, left pleural effusion, complicated with acute metabolic encephalopathy.  SP left thoracentesis  Patient with improvement in oxygenation, with 02 saturation  100% on room air. Bipap at night and as needed during the day. Metabolic encephalopathy is improving.    Acute  kidney injury superimposed on chronic kidney disease (HCC) CKD stage 3b.   Volume status has improved, renal function today with serum cr at 3,73 with K at 4,1 and serum bicarbonate at 33.  Na 135 Mg 2.4 .   Likely transition to po loop diuretic in am.   Bilateral renal cysts. Urinary tract infection has been ruled out.   Anemia of chronic renal disease. Chronic thrombocytopenia.   Second degree AV block, Mobitz type I Continue to hold on AV blockers. Continue telemetry.  BPH (benign prostatic hyperplasia) Continue with flomax and proscar  Foley catheter in place.   Obesity, class 2 Calculated BMI is 37,4        Subjective: Patient is feeling better, seating in the chair at the side of the bed. Eyes closed but responding to questions and following commands   Physical Exam: Vitals:   07/28/23 0820 07/28/23 1000 07/28/23 1415 07/28/23 1607  BP: 109/61  (!) 109/57 (!) 113/52  Pulse: 77  75 75  Resp: 20  20   Temp: 97.7 F (36.5 C)  97.7 F (36.5 C) 99.1 F (37.3 C)  TempSrc: Oral  Oral Oral  SpO2: 98%  100% 100%  Weight:  100.3 kg    Height:       Neurology awake and alert ENT with no pallor Cardiovascular with S1 and S2 present and regular with no gallops, rubs or murmurs Respiratory with mild rales at bases with no wheezing Abdomen with no distention  Positive lower extremity edema ++  Data Reviewed:    Family Communication: I spoke with Andrew Schmitt over the phone at the bedside, we talked in detail about Andrew condition, plan of care and prognosis and all questions were addressed.   Disposition: Status  is: Inpatient Remains inpatient appropriate because: diuresis, may need SNF  Planned Discharge Destination: Skilled nursing facility      Author: Coralie Keens, MD 07/28/2023 4:30 PM  For on call review [email or web address].

## 2023-07-28 NOTE — Progress Notes (Signed)
CXR with similar left effusion. Cytology, flow cytometry, culture pending.   I/O net negative 1.7L yesterday, now -13L for admission  CXR in AM, will follow up once cytology results available. If effusion fails to resolve or enlarges will likely need second thora with repeat cyto.   Steffanie Dunn, DO 07/28/23 5:19 PM [facility]  For contact information, see Amion. If no response to pager, please call PCCM consult pager. After hours, 7PM- 7AM, please call Elink.

## 2023-07-28 NOTE — Progress Notes (Signed)
Patient ID: Andrew Schmitt, male   DOB: 03/04/1951, 72 y.o.   MRN: [id number] [facility] Progress Note   Assessment/ Plan:   1.  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  We will continue to hold ARB and SGLT2 inhibitor for now.  Response to diuretics diminished 12/9 but did respond to higher doses. Some concern for uremia intermittently but given fluctuating mental state I think delirium is more likely. Appears great today. Volume overload does seems to be improving. Will decide on diuretic plan after seeing weight and labs.  2.  Acute exacerbation of congestive heart failure: volume status improving. F/u labs and weight then adjust diuretics if needed 3.  Encephalopathy: Possibly multifactorial with prominent component associated with his hypercarbia.  Uremia possible but seems unlikely as his mental status has improved despite rising BUN.  Will continue to monitor. 4.  Anemia: Secondary to chronic disease including chronic kidney disease.  IV iron was given 5.  Hypertension: Blood pressure fairly low.  HTN meds stopped 6. Pleural effusion: s/p thora predominantly lymphocytic. Some malignancy concern. Awaiting study results.  Subjective:   Patient alone today when I saw him.  More awake and alert.  Good conversation and seems very clear today.  Urine output again about 3 L.  No weight available.  Labs pending   Objective:   BP 109/61 (BP Location: Left Arm)   Pulse 77   Temp 97.7 F (36.5 C) (Oral)   Resp 20   Ht 5\' 6"  (1.676 m)   Wt 105.3 kg   SpO2 98%   BMI 37.47 kg/m   Intake/Output Summary (Last 24 hours) at 07/28/2023 4098 Last data filed at 07/28/2023 0820 Gross per 24 hour  Intake 1267 ml  Output 3050 ml  Net -1783 ml   Weight change:   Physical Exam: Gen: Appears comfortable sitting up in recliner,  nad CVS: normal rate, no murmurs Resp: improved aeration, bilateral chest rise, no increased work of breathing Abd: Soft, obese, nontender, bowel sounds normal Ext: 1+ bilateral pitting lower extremity edema with venous stasis changes  Imaging: DG CHEST PORT 1 VIEW Result Date: 07/26/2023 CLINICAL DATA:  Post thoracentesis EXAM: PORTABLE CHEST 1 VIEW COMPARISON:  07/25/2023 FINDINGS: Decreasing left pleural effusion following thoracentesis. No pneumothorax. Small bilateral pleural effusions with bibasilar opacities, likely atelectasis. Cardiomegaly. No overt edema. IMPRESSION: Decreasing left effusion following thoracentesis.  No pneumothorax. Small bilateral effusions with bibasilar atelectasis. Cardiomegaly. Electronically Signed   By: Charlett Nose M.D.   On: 07/26/2023 20:05    Labs: BMET Recent Labs  Lab 07/21/23 1415 07/21/23 1420 07/22/23 0343 07/23/23 0229 07/24/23 0259 07/25/23 0252 07/26/23 0239 07/27/23 0256  NA 135 138 136 139 138 138 137 133*  K 4.5 4.6 4.6 4.7 4.2 4.3 4.6 4.2  CL 101  --  101 100 97* 95* 93* 91*  CO2 25  --  26 29 28  32 32 31  GLUCOSE 128*  --  111* 125* 141* 116* 114* 120*  BUN 63*  --  61* 61* 68* 81* 96* 109*  CREATININE 3.18*  --  3.04* 3.41* 3.65* 4.02* 4.15* 3.77*  CALCIUM 8.8*  --  8.7* 8.8* 9.1 9.0 9.0 8.7*   CBC Recent Labs  Lab 07/21/23 1415 07/21/23 1420 07/24/23 0259 07/25/23 0252 07/26/23 0239 07/27/23 0256  WBC 4.3   < > 9.1 7.0 5.7 5.5  NEUTROABS 3.0  --   --   --   --   --   HGB 10.4*   < > 9.5* 8.9* 8.7* 8.3*  HCT 34.1*   < > 31.6* 29.8* 29.5* 26.8*  MCV 96.1   < > 97.5 97.1 96.1 94.4  PLT 132*   < > 88* 80* 85* 96*   < > = values in this interval not displayed.    Medications:     Chlorhexidine Gluconate Cloth  6 each Topical Daily   feeding supplement  237 mL Oral BID BM   finasteride  5 mg Oral Daily   guaiFENesin  600 mg Oral BID   heparin injection (subcutaneous)  5,000 Units Subcutaneous Q8H   polyethylene  glycol  17 g Oral Daily   rosuvastatin  10 mg Oral Daily   tamsulosin  0.4 mg Oral Daily   Darnell Level  07/28/2023, 9:29 AM

## 2023-07-29 DIAGNOSIS — I441 Atrioventricular block, second degree: Secondary | ICD-10-CM | POA: Diagnosis not present

## 2023-07-29 DIAGNOSIS — I5033 Acute on chronic diastolic (congestive) heart failure: Secondary | ICD-10-CM | POA: Diagnosis not present

## 2023-07-29 DIAGNOSIS — N401 Enlarged prostate with lower urinary tract symptoms: Secondary | ICD-10-CM | POA: Diagnosis not present

## 2023-07-29 DIAGNOSIS — N179 Acute kidney failure, unspecified: Secondary | ICD-10-CM | POA: Diagnosis not present

## 2023-07-29 LAB — URINALYSIS, ROUTINE W REFLEX MICROSCOPIC
Bilirubin Urine: NEGATIVE
Glucose, UA: NEGATIVE mg/dL
Ketones, ur: NEGATIVE mg/dL
Leukocytes,Ua: NEGATIVE
Nitrite: NEGATIVE
Protein, ur: 100 mg/dL — AB
RBC / HPF: >50 RBC/hpf (ref 0–5)
Specific Gravity, Urine: 1.013 (ref 1.005–1.030)
pH: 5 (ref 5.0–8.0)

## 2023-07-29 LAB — RENAL FUNCTION PANEL
Albumin: 3.1 g/dL — ABNORMAL LOW (ref 3.5–5.0)
Anion gap: 13 (ref 5–15)
BUN: 149 mg/dL — ABNORMAL HIGH (ref 8–23)
CO2: 35 mmol/L — ABNORMAL HIGH (ref 22–32)
Calcium: 9.2 mg/dL (ref 8.9–10.3)
Chloride: 86 mmol/L — ABNORMAL LOW (ref 98–111)
Creatinine, Ser: 3.82 mg/dL — ABNORMAL HIGH (ref 0.61–1.24)
GFR, Estimated: 16 mL/min — ABNORMAL LOW (ref >60)
Glucose, Bld: 139 mg/dL — ABNORMAL HIGH (ref 70–99)
Phosphorus: 5 mg/dL — ABNORMAL HIGH (ref 2.5–4.6)
Potassium: 4.2 mmol/L (ref 3.5–5.1)
Sodium: 134 mmol/L — ABNORMAL LOW (ref 135–145)

## 2023-07-29 LAB — BODY FLUID CULTURE W GRAM STAIN: Culture: NO GROWTH

## 2023-07-29 LAB — GLUCOSE, CAPILLARY: Glucose-Capillary: 145 mg/dL — ABNORMAL HIGH (ref 70–99)

## 2023-07-29 MED ORDER — CALCIUM ACETATE (PHOS BINDER) 667 MG PO CAPS
667.0000 mg | ORAL_CAPSULE | Freq: Three times a day (TID) | ORAL | Status: DC
  Administered 2023-07-30 – 2023-08-17 (×39): 667 mg via ORAL
  Filled 2023-07-29 (×45): qty 1

## 2023-07-29 NOTE — Progress Notes (Signed)
Progress Note   Patient: Andrew Schmitt ZOX:[id number] DOB: 1951/01/27 DOA: 07/21/2023     7 DOS: the patient was seen and examined on 07/29/2023   Brief hospital course: Andrew Schmitt was admitted to the hospital with the working diagnosis of acute heart failure decompensation.   [name] with chronic diastolic CHF, hypertension, dyslipidemia, ascending aortic aneurysm, BPH, CKD 3b/4, chronic anemia, gout, obesity presented to the ED with progressive weakness and shortness of breath.  In the ER labs noted hemoglobin of 10.4, platelets 132, creatinine 3.1, BNP 182, troponin 337, 278, CT chest abdomen pelvis with moderate left and small right pleural effusions, mild bilateral perinephric stranding, diffuse abdominal wall edema. -Admitted,  -12/6: started on diuretics, cardiology following, Coude catheter placed for hematuria w/ CLots and increased discomfort before overt retention -12/7 more encephalopathic, placed on BiPAP, very mild respiratory acidosis, pCO2 in the 60-70 range -Complicated by worsening AKI, nephrology consulting, diuretic dose increased  12/13 improved volume status, but renal function not yet stable.   Assessment and Plan: * Acute on chronic diastolic CHF (congestive heart failure) (HCC) Echocardiogram with preserved LV systolic function with EF 65 to 70%, moderate LVH. RV systolic function preserved, LA with moderate dilatation, no significant valvular disease.   Urine output is 1,985 ml Systolic blood pressure 109 to 110 mmHg.   Possible transition to oral loop diuretic in the next 24 hrs.  Limited further guideline directed medical therapy due to low GFR.  12/11 metolazone 5 mg   Acute hypoxemic hyercapnic respiratory failure, due to acute cardiogenic pulmonary edema, left pleural effusion, complicated with acute metabolic encephalopathy.  SP left thoracentesis  Patient with improvement in oxygenation, with 02 saturation  100% on room air. Bipap at night and as needed  during the day. Metabolic encephalopathy is improving.    Acute kidney injury superimposed on chronic kidney disease (HCC) CKD stage 3b. Hyponatremia,.   Renal function today with serum cr at 3,8 with K at 4,2 and serum bicarbonate at 35  Na 134  P 5.0 .  Urine analysis with SH 1.013, protein 100, large Hgb, negative leukocytes, 21-50 wbc and > 50 rbc.   Add P binders Follow up renal function in am.  Patient continues to have foley catheter in place.   Bilateral renal cysts. Urinary tract infection has been ruled out.   Anemia of chronic renal disease. Chronic thrombocytopenia.   Second degree AV block, Mobitz type I Continue to hold on AV blockers. Continue telemetry.  BPH (benign prostatic hyperplasia) Continue with flomax and proscar  Foley catheter in place.   Obesity, class 2 Calculated BMI is 37,4        Subjective: Patient is more awake and alert today, with no chest pain, no nausea or vomiting, his dyspnea has improved, but continue to have peripheral edema   Physical Exam: Vitals:   07/28/23 1937 07/28/23 2349 07/29/23 0456 07/29/23 0730  BP: (!) 108/58 124/65 112/60 (!) 95/56  Pulse: 76 78 70 74  Resp: 19 18 18 19   Temp: 98.9 F (37.2 C) 98.8 F (37.1 C) 98.6 F (37 C) 98.6 F (37 C)  TempSrc: Oral Oral Oral Oral  SpO2: 94% 99% 98% 98%  Weight:   103.6 kg   Height:       Neurology awake and alert ENT with mild pallor Cardiovascular with S1 and S2 present and regular with no gallops, or rubs, positive systolic murmur at the right lower sternal border  No JVD Positive lower extremity edema ++  Respiratory with mild rales at bases with no wheezing or rhonchi Abdomen with no distention  Data Reviewed:    Family Communication: I spoke with patient's daughter over the phone at the bedside, we talked in detail about patient's condition, plan of care and prognosis and all questions were addressed.   Disposition: Status is: Inpatient Remains  inpatient appropriate because: pending improving of renal function   Planned Discharge Destination: Home     Author: Coralie Keens, MD 07/29/2023 4:05 PM  For on call review [email or web address].

## 2023-07-29 NOTE — Progress Notes (Signed)
Patient ID: Andrew Schmitt, male   DOB: [birth year], 72 y.o.   MRN: [id number] [facility] Progress Note   Assessment/ Plan:   1.  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  We will continue to hold ARB and SGLT2 inhibitor for now.  Response to diuretics diminished 12/9 but did respond to higher doses. Some concern for uremia intermittently but given fluctuating mental state I think delirium is more likely.  He continues to look well today. Crt stable but BUN risen. Some change to urine character which may just be concentration of urine given his sediment looks excellent without signs of UTI or ATN. Will send UA to be certain. Continue to hold diuretics today. Hopefully BUN will start coming down. 2.  Acute exacerbation of congestive heart failure: volume status improving. Holding diuretics as above 3.  Encephalopathy: Possibly multifactorial with prominent component associated with his hypercarbia.  Much improved despite rising BUN 4.  Anemia: Secondary to chronic disease including chronic kidney disease.  IV iron was given 5.  Hypertension: Blood pressure fairly low.  HTN meds stopped 6. Pleural effusion: s/p thora predominantly lymphocytic. Some malignancy concern. Awaiting study results.  Subjective:   Patient seen today.  Feels tired but continues to be awake and alert.  Care was discussed with the patient's son as well as his daughter.  Overall doing well.  They feel like he is more awake and alert.  Creatinine about the same but BUN is risen.  Good urine output yesterday despite only receiving 1 dose of diuretics.  Patient has Foley catheter and have noticed urine has become more dark.  I took the patient's urine to the lab and looked at his sediment.  Sediment demonstrated: 10-20 RBCs per high-powered field none with dysmorphic  features, rare renal tubular epithelial cells, rare WBCs, no muddy brown casts, no cellular casts, 1 lipid-laden cast.   Objective:   BP (!) 95/56 (BP Location: Left Arm)   Pulse 74   Temp 98.6 F (37 C) (Oral)   Resp 19   Ht 5\' 6"  (1.676 m)   Wt 103.6 kg   SpO2 98%   BMI 36.86 kg/m   Intake/Output Summary (Last 24 hours) at 07/29/2023 4098 Last data filed at 07/29/2023 0500 Gross per 24 hour  Intake 277 ml  Output 1985 ml  Net -1708 ml   Weight change:   Physical Exam: Gen: Appears comfortable sitting up in recliner, nad CVS: normal rate, no murmurs Resp: improved aeration, bilateral chest rise, no increased work of breathing Abd: Soft, obese, nontender, bowel sounds normal Ext: 1+ bilateral pitting lower extremity edema with venous stasis changes  Imaging: DG CHEST PORT 1 VIEW Result Date: 07/28/2023 CLINICAL DATA:  142230 Pleural effusion 142230 EXAM: PORTABLE CHEST 1 VIEW COMPARISON:  July 26, 2023 FINDINGS: The cardiomediastinal silhouette is unchanged in contour. Moderate LEFT pleural effusion, similar comparison to prior. Trace RIGHT pleural effusion. No pneumothorax. Similar homogeneous opacification of the LEFT lung base. IMPRESSION: Similar moderate LEFT pleural effusion with associated LEFT basilar opacification. Electronically Signed   By: Meda Klinefelter M.D.   On: 07/28/2023 10:02    Labs: BMET Recent Labs  Lab 07/23/23 0229 07/24/23 0259 07/25/23 0252 07/26/23 0239 07/27/23 0256 07/28/23 0852 07/29/23 0249  NA 139 138 138 137 133* 135 134*  K 4.7 4.2 4.3 4.6 4.2 4.1 4.2  CL 100  97* 95* 93* 91* 87* 86*  CO2 29 28 32 32 31 33* 35*  GLUCOSE 125* 141* 116* 114* 120* 137* 139*  BUN 61* 68* 81* 96* 109* 137* 149*  CREATININE 3.41* 3.65* 4.02* 4.15* 3.77* 3.73* 3.82*  CALCIUM 8.8* 9.1 9.0 9.0 8.7* 9.1 9.2  PHOS  --   --   --   --   --   --  5.0*   CBC Recent Labs  Lab 07/24/23 0259 07/25/23 0252 07/26/23 0239 07/27/23 0256  WBC 9.1 7.0  5.7 5.5  HGB 9.5* 8.9* 8.7* 8.3*  HCT 31.6* 29.8* 29.5* 26.8*  MCV 97.5 97.1 96.1 94.4  PLT 88* 80* 85* 96*    Medications:     Chlorhexidine Gluconate Cloth  6 each Topical Daily   feeding supplement  237 mL Oral BID BM   finasteride  5 mg Oral Daily   guaiFENesin  600 mg Oral BID   heparin injection (subcutaneous)  5,000 Units Subcutaneous Q8H   polyethylene glycol  17 g Oral Daily   rosuvastatin  10 mg Oral Daily   tamsulosin  0.4 mg Oral Daily   Darnell Level  07/29/2023, 9:39 AM

## 2023-07-29 NOTE — Evaluation (Addendum)
Occupational Therapy Evaluation Patient Details Name: Andrew Schmitt MRN: [id number] DOB: 1951/05/27 Today's Date: 07/29/2023   History of Present Illness Patient is a 72 year old with acute on chronic diastolic CHF, Acute hypoxemic hyercapnic respiratory failure, acute cardiogenic pulmonary edema, left pleural effusion, metabolic encephalopathy. S/p left thoracentesis.   Clinical Impression   At baseline, pt is Independent with ADLs, IADLs, and functional mobility without an AD and drives. Pt reports occasional assist at baseline to donn/doff compression socks. Pt now presents with decreased activity tolerance, decreased B UE strength, decreased balance during functional tasks, cardiopulmonary status affecting functional level, and decreased safety and independence with functional tasks. Pt currently demonstrates ability to complete UB ADLs Independent to Min assist, LB ADLs with Mod assist, and functional sit<-> stand and step-pivot transfers with a RW with Contact guard to Min assist. Pt's daughter present on speaker phone during a portion of session and requesting OT call back to discuss discharge recommendation with pt agreeable to OT speaking with daughter. OT attempted to call daughter x2, but was unable to reach daughter and with no option to leave a voicemail. OT to reattempt to reach daughter at a later time. Pt will benefit from acute skilled OT services to address deficits outlined below and to increase safety, decrease caregiver burden, and independence with ADLs, functional transfers, and functional mobility. Post acute discharge, pt will benefit from continued skilled OT services in the home to maximize rehab potential.       If plan is discharge home, recommend the following: A little help with walking and/or transfers;A lot of help with bathing/dressing/bathroom;Assistance with cooking/housework;Assist for transportation;Help with stairs or ramp for entrance    Functional Status  Assessment  Patient has had a recent decline in their functional status and demonstrates the ability to make significant improvements in function in a reasonable and predictable amount of time.  Equipment Recommendations  BSC/3in1;Tub/shower seat    Recommendations for Other Services       Precautions / Restrictions Precautions Precautions: Fall Restrictions Weight Bearing Restrictions Per Provider Order: No      Mobility Bed Mobility               General bed mobility comments: Pt sitting in recliner at beginning and end of session    Transfers Overall transfer level: Needs assistance Equipment used: Rolling walker (2 wheels) Transfers: Sit to/from Stand, Bed to chair/wheelchair/BSC Sit to Stand: Contact guard assist, Min assist     Step pivot transfers: Contact guard assist, Min assist (with RW)     General transfer comment: Cues for hand placement/technique and assist for power up      Balance Overall balance assessment: Needs assistance Sitting-balance support: Single extremity supported, No upper extremity supported, Feet supported Sitting balance-Leahy Scale: Good     Standing balance support: Bilateral upper extremity supported, During functional activity, Reliant on assistive device for balance Standing balance-Leahy Scale: Poor Standing balance comment: using rolling walker for support in standing                           ADL either performed or assessed with clinical judgement   ADL Overall ADL's : Needs assistance/impaired Eating/Feeding: Sitting;Independent   Grooming: Set up;Sitting   Upper Body Bathing: Minimal assistance;Sitting;Cueing for compensatory techniques (with rest breaks needed)   Lower Body Bathing: Moderate assistance;Sitting/lateral leans;Sit to/from stand;Cueing for compensatory techniques (with rest breaks needed)   Upper Body Dressing : Contact guard assist;Sitting  Upper Body Dressing Details (indicate cue type  and reason): assist with lines Lower Body Dressing: Moderate assistance;Sit to/from stand;Sitting/lateral leans;Cueing for compensatory techniques Lower Body Dressing Details (indicate cue type and reason): may benefit from AE to donn/doff compression socks Toilet Transfer: Contact guard assist;Minimal assistance;Ambulation;Rolling walker (2 wheels);BSC/3in1;Cueing for safety Toilet Transfer Details (indicate cue type and reason): simulated at EOB Toileting- Clothing Manipulation and Hygiene: Moderate assistance;Cueing for compensatory techniques;Sitting/lateral lean;Sit to/from stand         General ADL Comments: Functional mobility deferred due to pt with decreased activity tolerance and reporting fatigue with standing/stepping. OT educated pt in use of DME in the home for energy conservation and increased safety in the home with pt agreeable to use at this time as he increases his strength, activity tolerance, and balance.     Vision Baseline Vision/History: 1 Wears glasses (progressives) Ability to See in Adequate Light: 0 Adequate (with glasses) Patient Visual Report: No change from baseline       Perception         Praxis         Pertinent Vitals/Pain Pain Assessment Pain Assessment: No/denies pain     Extremity/Trunk Assessment Upper Extremity Assessment Upper Extremity Assessment: Right hand dominant;Generalized weakness   Lower Extremity Assessment Lower Extremity Assessment: Defer to PT evaluation       Communication Communication Communication: No apparent difficulties   Cognition Arousal: Alert, Lethargic (Initially lethargic but alerting quickly with activity) Behavior During Therapy: [facility] for tasks assessed/performed Overall Cognitive Status: Within Functional Limits for tasks assessed                                 General Comments: Largely WFL but with mildly decreased insight into deficts and decreased understanding of role of skilled  acute and HH rehab after education.     General Comments  Pt's O2 sat largely >/ 92% on RA during session but with brief drop into upper 80s with prolonged stand and with pt slow to recover. Pt placed back on 6L continuous O2 through nasal cannula with O2 quickly recovering to >/92%. Pt left on 6L at end of session. Pt's HR in the 70s and 8s throughout session. Pt's nephew present throughout session and pt's daughter present on speaker phone at beginning of session. Pt's daughter, Felecia Shelling, requested a call back for an update OT recommendations following eval with pt providing verbal consent to speak with daughter. OT attempted to call daughter x2 but with no answer and no option to leave a voicemail. Note left to attempt to call daughter again following next OT session.    Exercises     Shoulder Instructions      Home Living Family/patient expects to be discharged to:: Private residence Living Arrangements: Spouse/significant other;Children (son) Available Help at Discharge: Family Type of Home: House Home Access: Stairs to enter Secretary/administrator of Steps: 1 Entrance Stairs-Rails: None Home Layout: Two level;1/2 bath on main level;Bed/bath upstairs (plans to live on main level with 1/2 bath if unable to get to upstairs bedroom) Alternate Level Stairs-Number of Steps: flight   Bathroom Shower/Tub: Producer, television/film/video: Standard     Home Equipment: Agricultural consultant (2 wheels);Wheelchair - manual   Additional Comments: Home living per pt and daughter report      Prior Functioning/Environment Prior Level of Function : Independent/Modified Independent;Driving  Mobility Comments: Independent without device ADLs Comments: Independent with ADLs and IADLs and drives. Pt reports occasional assistance at baseline to donn/doff compression socks.        OT Problem List: Decreased strength;Decreased activity tolerance;Impaired balance (sitting and/or  standing);Decreased knowledge of use of DME or AE;Cardiopulmonary status limiting activity      OT Treatment/Interventions: Self-care/ADL training;Therapeutic exercise;Energy conservation;DME and/or AE instruction;Therapeutic activities;Patient/family education;Balance training    OT Goals(Current goals can be found in the care plan section) Acute Rehab OT Goals Patient Stated Goal: to be independent again and to return home OT Goal Formulation: With patient Time For Goal Achievement: 08/12/23 Potential to Achieve Goals: Good ADL Goals Pt Will Perform Lower Body Bathing: with supervision;sitting/lateral leans;sit to/from stand;with adaptive equipment Pt Will Perform Lower Body Dressing: with supervision;with adaptive equipment;sit to/from stand Pt Will Transfer to Toilet: with supervision;ambulating;bedside commode (with least restrictive AD) Pt Will Perform Toileting - Clothing Manipulation and hygiene: with supervision;sitting/lateral leans;sit to/from stand Pt/caregiver will Perform Home Exercise Program: Increased strength;Both right and left upper extremity;With theraband;With written HEP provided;Independently (increased activity tolerance) Additional ADL Goal #1: Patient will demonstrate ability to Independently state 3 strategies for improved management of CHF with handout provided.  OT Frequency: Min 1X/week    Co-evaluation              AM-PAC OT "6 Clicks" Daily Activity     Outcome Measure Help from another person eating meals?: None Help from another person taking care of personal grooming?: A Little Help from another person toileting, which includes using toliet, bedpan, or urinal?: A Lot Help from another person bathing (including washing, rinsing, drying)?: A Lot Help from another person to put on and taking off regular upper body clothing?: None Help from another person to put on and taking off regular lower body clothing?: A Lot 6 Click Score: 17   End of Session  Equipment Utilized During Treatment: Rolling walker (2 wheels);Oxygen;Gait belt Nurse Communication: Mobility status;Other (comment) (VS)  Activity Tolerance: Patient tolerated treatment well Patient left: in chair;with call bell/phone within reach;with family/visitor present  OT Visit Diagnosis: Unsteadiness on feet (R26.81);Muscle weakness (generalized) (M62.81);Other (comment) (decreased activity tolerance)                Time: 4098-1191 OT Time Calculation (min): 23 min Charges:  OT General Charges $OT Visit: 1 Visit OT Evaluation $OT Eval Low Complexity: 1 Low  Evania Lyne "Kyle" M., OTR/L, MA Acute Rehab [phone]   Lendon Colonel 07/29/2023, 3:03 PM

## 2023-07-29 NOTE — Progress Notes (Signed)
   07/29/23 2326  BiPAP/CPAP/SIPAP  Reason BIPAP/CPAP not in use Non-compliant   Pt refused CPAP for night time use.

## 2023-07-29 NOTE — Progress Notes (Signed)
    Durable Medical Equipment  (From admission, onward)           Start     Ordered   07/29/23 1608  For home use only DME Bedside commode  Once       Question:  Patient needs a bedside commode to treat with the following condition  Answer:  Weakness   07/29/23 1607   07/29/23 1606  For home use only DME Hospital bed  Once       Comments: Therapeutic mattress  Question Answer Comment  Length of Need Lifetime   Patient has (list medical condition): CHF   The above medical condition requires: Patient requires the ability to reposition frequently   Head must be elevated greater than: 30 degrees   Bed type Semi-electric      07/29/23 1607

## 2023-07-29 NOTE — TOC Progression Note (Addendum)
Transition of Care [facility]) - Progression Note    Patient Details  Name: Andrew Schmitt MRN: [id number] Date of Birth: 30-Apr-1951  [facility] CM/SW Contact  Leone Haven, RN Phone Number: 07/29/2023, 4:08 PM  Clinical Narrative:    NCM spoke with daughter Felecia Shelling, offered choice, she chose [facility].  NCM made referral to [facility] with [facility], she is able to take referral for HHPT, HHOT.  HHRN . Soc will begin 24 to 48 hrs post dc.  Also she has no preference for the DME  for hospital bed and BSC.  NCM made referral to [facility] with [employer] .   Will need to watch to see if he will need home oxygen,  this NCM spoke to [place] with [employer] if he needs the home oxygen.     Expected Discharge Plan: Home/Self Care Barriers to Discharge: Continued Medical Work up  Expected Discharge Plan and Services In-house Referral: NA Discharge Planning Services: CM Consult Post Acute Care Choice: NA Living arrangements for the past 2 months: Single Family Home                 DME Arranged: N/A DME Agency: NA       HH Arranged: NA           Social Determinants of Health (SDOH) Interventions SDOH Screenings   Food Insecurity: No Food Insecurity (07/22/2023)  Housing: Low Risk  (07/22/2023)  Transportation Needs: No Transportation Needs (07/22/2023)  Utilities: Not At Risk (07/22/2023)  Alcohol Screen: Low Risk  (07/07/2023)  Depression (PHQ2-9): Low Risk  (07/07/2023)  Financial Resource Strain: Low Risk  (07/07/2023)  Physical Activity: Insufficiently Active (07/07/2023)  Social Connections: Moderately Integrated (07/07/2023)  Stress: No Stress Concern Present (07/07/2023)  Tobacco Use: Low Risk  (07/26/2023)  Health Literacy: Adequate Health Literacy (07/07/2023)    Readmission Risk Interventions     No data to display

## 2023-07-29 NOTE — Plan of Care (Signed)
  Problem: Clinical Measurements: Goal: Respiratory complications will improve Outcome: Progressing Goal: Cardiovascular complication will be avoided Outcome: Progressing   Problem: Activity: Goal: Risk for activity intolerance will decrease Outcome: Progressing   Problem: Nutrition: Goal: Adequate nutrition will be maintained Outcome: Progressing   Problem: Coping: Goal: Level of anxiety will decrease Outcome: Progressing   Problem: Elimination: Goal: Will not experience complications related to bowel motility Outcome: Progressing   Problem: Pain Management: Goal: General experience of comfort will improve Outcome: Progressing   Problem: Safety: Goal: Ability to remain free from injury will improve Outcome: Progressing   Problem: Education: Goal: Ability to verbalize understanding of medication therapies will improve Outcome: Progressing   Problem: Cardiac: Goal: Ability to achieve and maintain adequate cardiopulmonary perfusion will improve Outcome: Progressing

## 2023-07-29 NOTE — Evaluation (Signed)
Physical Therapy Evaluation Patient Details Name: Andrew Schmitt MRN: [id number] DOB: Aug 28, 1950 Today's Date: 07/29/2023  History of Present Illness  Patient is a 72 year old with acute on chronic diastolic CHF, Acute hypoxemic hyercapnic respiratory failure, acute cardiogenic pulmonary edema, left pleural effusion, metabolic encephalopathy. S/p left thoracentesis.  Clinical Impression  Patient sleeping in chair on arrival to room, supportive son at the bedside. Patient reports he lives with his spouse and is independent with mobility at baseline. He has an upstairs bedroom at home but is willing to stay on the first floor with a 1/2 bath if needed.  Today the patient is able to stand with Min A from the chair. Short distance ambulation with rolling walker with CGA for safety. Activity tolerance limited by fatigue. The patient and and family are hopeful for return home at discharge with family support. Recommend HHPT at this time. PT will continue to follow while here to maximize independence and facilitate return to prior level of function.       If plan is discharge home, recommend the following: A little help with walking and/or transfers;A little help with bathing/dressing/bathroom;Assist for transportation;Help with stairs or ramp for entrance;Assistance with cooking/housework   Can travel by private vehicle        Equipment Recommendations Hospital bed;BSC/3in1 (depending on progress)  Recommendations for Other Services       Functional Status Assessment Patient has had a recent decline in their functional status and demonstrates the ability to make significant improvements in function in a reasonable and predictable amount of time.     Precautions / Restrictions Precautions Precautions: Fall Restrictions Weight Bearing Restrictions Per Provider Order: No      Mobility  Bed Mobility               General bed mobility comments: not assessed as patient sitting up on  arrival and post session    Transfers Overall transfer level: Needs assistance Equipment used: Rolling walker (2 wheels) Transfers: Sit to/from Stand Sit to Stand: Min assist           General transfer comment: lifting assistance required for standing. cues for hand placement    Ambulation/Gait Ambulation/Gait assistance: Contact guard assist Gait Distance (Feet): 8 Feet Assistive device: Rolling walker (2 wheels) Gait Pattern/deviations: Decreased stride length Gait velocity: decreased     General Gait Details: patient walked a short distance in the room without loss of balance using rolling walker. activity tolerance limited by fatigue. Sp02 down to 88% on room air. patient declined walking further at this time  Acupuncturist Bed    Modified Rankin (Stroke Patients Only)       Balance Overall balance assessment: Needs assistance Sitting-balance support: Feet supported Sitting balance-Leahy Scale: Good     Standing balance support: Bilateral upper extremity supported Standing balance-Leahy Scale: Poor Standing balance comment: using rolling walker for support in standing                             Pertinent Vitals/Pain Pain Assessment Pain Assessment: No/denies pain    Home Living Family/patient expects to be discharged to:: Private residence Living Arrangements: Spouse/significant other (and son) Available Help at Discharge: Family Type of Home: House Home Access: Stairs to enter   Entrance Stairs-Number of Steps: 1 Alternate Level Stairs-Number of Steps:  (1 flight) Home  Layout: Two level;Bed/bath upstairs;1/2 bath on main level (plans to live on main level with 1/2 bath if unable to get to upstairs bedroom) Home Equipment: Agricultural consultant (2 wheels);Wheelchair - manual      Prior Function Prior Level of Function : Independent/Modified Independent             Mobility Comments: independent  without device       Extremity/Trunk Assessment   Upper Extremity Assessment Upper Extremity Assessment: Generalized weakness    Lower Extremity Assessment Lower Extremity Assessment: Generalized weakness       Communication   Communication Communication: No apparent difficulties  Cognition Arousal: Alert Behavior During Therapy: WFL for tasks assessed/performed Overall Cognitive Status: Within Functional Limits for tasks assessed                                          General Comments General comments (skin integrity, edema, etc.): supportive son at the bedside. the family goal is to return home at discharge with family support and they are open to HHPT.    Exercises     Assessment/Plan    PT Assessment Patient needs continued PT services  PT Problem List Decreased strength;Decreased activity tolerance;Decreased balance;Decreased mobility;Decreased safety awareness;Decreased knowledge of use of DME       PT Treatment Interventions DME instruction;Gait training;Stair training;Functional mobility training;Therapeutic activities;Therapeutic exercise;Balance training;Neuromuscular re-education;Cognitive remediation;Patient/family education    PT Goals (Current goals can be found in the Care Plan section)  Acute Rehab PT Goals Patient Stated Goal: family goal to return home with family support and HHPT PT Goal Formulation: With patient/family Time For Goal Achievement: 08/12/23 Potential to Achieve Goals: Good    Frequency Min 1X/week     Co-evaluation               AM-PAC PT "6 Clicks" Mobility  Outcome Measure Help needed turning from your back to your side while in a flat bed without using bedrails?: A Little Help needed moving from lying on your back to sitting on the side of a flat bed without using bedrails?: A Little Help needed moving to and from a bed to a chair (including a wheelchair)?: A Little Help needed standing up from a  chair using your arms (e.g., wheelchair or bedside chair)?: A Little Help needed to walk in hospital room?: A Little Help needed climbing 3-5 steps with a railing? : A Lot 6 Click Score: 17    End of Session Equipment Utilized During Treatment: Oxygen Activity Tolerance: Patient tolerated treatment well Patient left: in chair;with call bell/phone within reach;with family/visitor present Nurse Communication: Mobility status PT Visit Diagnosis: Unsteadiness on feet (R26.81);Muscle weakness (generalized) (M62.81)    Time: 4259-5638 PT Time Calculation (min) (ACUTE ONLY): 22 min   Charges:   PT Evaluation $PT Eval Low Complexity: 1 Low   PT General Charges $$ ACUTE PT VISIT: 1 Visit        Donna Bernard, PT, MPT   Ina Homes 07/29/2023, 9:20 AM

## 2023-07-30 DIAGNOSIS — N401 Enlarged prostate with lower urinary tract symptoms: Secondary | ICD-10-CM | POA: Diagnosis not present

## 2023-07-30 DIAGNOSIS — I441 Atrioventricular block, second degree: Secondary | ICD-10-CM | POA: Diagnosis not present

## 2023-07-30 DIAGNOSIS — I5033 Acute on chronic diastolic (congestive) heart failure: Secondary | ICD-10-CM | POA: Diagnosis not present

## 2023-07-30 DIAGNOSIS — N179 Acute kidney failure, unspecified: Secondary | ICD-10-CM | POA: Diagnosis not present

## 2023-07-30 LAB — RENAL FUNCTION PANEL
Albumin: 3.1 g/dL — ABNORMAL LOW (ref 3.5–5.0)
Anion gap: 12 (ref 5–15)
BUN: 162 mg/dL — ABNORMAL HIGH (ref 8–23)
CO2: 34 mmol/L — ABNORMAL HIGH (ref 22–32)
Calcium: 9.3 mg/dL (ref 8.9–10.3)
Chloride: 87 mmol/L — ABNORMAL LOW (ref 98–111)
Creatinine, Ser: 3.47 mg/dL — ABNORMAL HIGH (ref 0.61–1.24)
GFR, Estimated: 18 mL/min — ABNORMAL LOW (ref >60)
Glucose, Bld: 130 mg/dL — ABNORMAL HIGH (ref 70–99)
Phosphorus: 5.2 mg/dL — ABNORMAL HIGH (ref 2.5–4.6)
Potassium: 4.4 mmol/L (ref 3.5–5.1)
Sodium: 133 mmol/L — ABNORMAL LOW (ref 135–145)

## 2023-07-30 MED ORDER — FUROSEMIDE 10 MG/ML IJ SOLN
120.0000 mg | Freq: Three times a day (TID) | INTRAVENOUS | Status: DC
  Administered 2023-07-30 (×3): 120 mg via INTRAVENOUS
  Filled 2023-07-30: qty 10
  Filled 2023-07-30: qty 12
  Filled 2023-07-30: qty 10
  Filled 2023-07-30: qty 2

## 2023-07-30 MED ORDER — METOLAZONE 5 MG PO TABS
5.0000 mg | ORAL_TABLET | Freq: Once | ORAL | Status: AC
  Administered 2023-07-30: 5 mg via ORAL
  Filled 2023-07-30: qty 1

## 2023-07-30 NOTE — Plan of Care (Signed)
  Problem: Clinical Measurements: Goal: Cardiovascular complication will be avoided Outcome: Progressing   Problem: Nutrition: Goal: Adequate nutrition will be maintained Outcome: Progressing   Problem: Elimination: Goal: Will not experience complications related to bowel motility Outcome: Progressing Goal: Will not experience complications related to urinary retention Outcome: Progressing   Problem: Safety: Goal: Ability to remain free from injury will improve Outcome: Progressing

## 2023-07-30 NOTE — Plan of Care (Signed)
  Problem: Education: Goal: Knowledge of General Education information will improve Description: Including pain rating scale, medication(s)/side effects and non-pharmacologic comfort measures Outcome: Progressing   Problem: Clinical Measurements: Goal: Ability to maintain clinical measurements within normal limits will improve Outcome: Progressing   Problem: Clinical Measurements: Goal: Will remain free from infection Outcome: Progressing   Problem: Clinical Measurements: Goal: Diagnostic test results will improve Outcome: Progressing   Problem: Clinical Measurements: Goal: Respiratory complications will improve Outcome: Progressing   Problem: Activity: Goal: Risk for activity intolerance will decrease Outcome: Progressing   Problem: Nutrition: Goal: Adequate nutrition will be maintained Outcome: Progressing

## 2023-07-30 NOTE — Progress Notes (Signed)
Patient ID: Andrew Schmitt, male   DOB: 1951-02-02, 72 y.o.   MRN: [id number] [facility] Progress Note   Assessment/ Plan:   1.  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  We will continue to hold ARB and SGLT2 inhibitor for now.  Response to diuretics diminished 12/9 but did respond to higher doses. Some concern for uremia intermittently but given fluctuating mental state I think delirium is more likely.  Urine sediment evaluated for ATN on 12/13 and fortunately no muddy brown casts  Patient's BUN continues to rise.  Unclear to me why despite holding diuretics.  Creatinine does improved.  Weight is increasing and edema is worse.  I think we have to start diuretics again today.  Fortunately no signs of uremia.  However, may have to consider dialysis next week if he fails to improve 2.  Acute exacerbation of congestive heart failure: volume status had improved but then held diuretics for 2 days.  Restarted IV Lasix 160 mg 3 times daily and metolazone 5 mg 3.  Encephalopathy: Possibly multifactorial with prominent component associated with his hypercarbia.  Now much improved despite rising BUN 4.  Anemia: Secondary to chronic disease including chronic kidney disease.  IV iron was given 5.  Hypertension: Blood pressure fairly low.  HTN meds stopped 6. Pleural effusion: s/p thora predominantly lymphocytic. Some malignancy concern. Awaiting study results.  Subjective:   Patient feels about the same today.  Weight continues to increase.  Urine output lower around 1.5 L.  BUN increased and creatinine decreased.  Patient's mentation continues to be very good.   Objective:   BP (!) 112/56 (BP Location: Left Arm)   Pulse 72   Temp 98.2 F (36.8 C) (Oral)   Resp 18   Ht 5\' 6"  (1.676 m)   Wt 104.2 kg   SpO2 93%   BMI 37.08 kg/m    Intake/Output Summary (Last 24 hours) at 07/30/2023 8295 Last data filed at 07/30/2023 0448 Gross per 24 hour  Intake 511 ml  Output 1505 ml  Net -994 ml   Weight change: 3.9 kg  Physical Exam: Gen: Appears comfortable sitting up in recliner, nad CVS: normal rate, no murmurs Resp: Bilateral chest rise with no increased work of breathing Abd: Soft, obese, nontender, bowel sounds normal Ext: 2+ bilateral pitting lower extremity edema with venous stasis changes  Urine sediment exam on 12/13: 10-20 RBCs per high-powered field none with dysmorphic features, rare renal tubular epithelial cells, rare WBCs, no muddy brown casts, no cellular casts, 1 lipid-laden cast.  Imaging: No results found.   Labs: BMET Recent Labs  Lab 07/24/23 0259 07/25/23 0252 07/26/23 0239 07/27/23 0256 07/28/23 0852 07/29/23 0249 07/30/23 0224  NA 138 138 137 133* 135 134* 133*  K 4.2 4.3 4.6 4.2 4.1 4.2 4.4  CL 97* 95* 93* 91* 87* 86* 87*  CO2 28 32 32 31 33* 35* 34*  GLUCOSE 141* 116* 114* 120* 137* 139* 130*  BUN 68* 81* 96* 109* 137* 149* 162*  CREATININE 3.65* 4.02* 4.15* 3.77* 3.73* 3.82* 3.47*  CALCIUM 9.1 9.0 9.0 8.7* 9.1 9.2 9.3  PHOS  --   --   --   --   --  5.0* 5.2*   CBC Recent Labs  Lab 07/24/23 0259 07/25/23 0252 07/26/23 0239 07/27/23 0256  WBC 9.1 7.0 5.7 5.5  HGB 9.5* 8.9*  8.7* 8.3*  HCT 31.6* 29.8* 29.5* 26.8*  MCV 97.5 97.1 96.1 94.4  PLT 88* 80* 85* 96*    Medications:     calcium acetate  667 mg Oral TID WC   Chlorhexidine Gluconate Cloth  6 each Topical Daily   feeding supplement  237 mL Oral BID BM   finasteride  5 mg Oral Daily   guaiFENesin  600 mg Oral BID   heparin injection (subcutaneous)  5,000 Units Subcutaneous Q8H   metolazone  5 mg Oral Once   polyethylene glycol  17 g Oral Daily   rosuvastatin  10 mg Oral Daily   tamsulosin  0.4 mg Oral Daily   Darnell Level  07/30/2023, 8:12 AM

## 2023-07-30 NOTE — Progress Notes (Signed)
Mobility Specialist Progress Note    07/30/23 1228  Mobility  Activity Ambulated with assistance in hallway  Level of Assistance Contact guard assist, steadying assist  Assistive Device Front wheel walker  Distance Ambulated (ft) 80 ft  Activity Response Tolerated well  Mobility Referral Yes  Mobility visit 1 Mobility  Mobility Specialist Start Time (ACUTE ONLY) 1215  Mobility Specialist Stop Time (ACUTE ONLY) 1227  Mobility Specialist Time Calculation (min) (ACUTE ONLY) 12 min   Pt received on BSC and agreeable. Pt stated concern about conserving energy so he does not get tired. Returned to chair with call bell in reach and visitor present.   [name] Mobility Specialist  Please Neurosurgeon or Rehab Office at [phone]

## 2023-07-30 NOTE — Progress Notes (Signed)
Progress Note   Patient: Andrew Schmitt ZOX:[id number] DOB: 09-Nov-1950 DOA: 07/21/2023     8 DOS: the patient was seen and examined on 07/30/2023   Brief hospital course: [name] was admitted to the hospital with the working diagnosis of acute heart failure decompensation.   [name] with chronic diastolic CHF, hypertension, dyslipidemia, ascending aortic aneurysm, BPH, CKD 3b/4, chronic anemia, gout, obesity presented to the ED with progressive weakness and shortness of breath for 7 days prior to admission. Worsening dyspnea on exertion, and lower extremity edema. Severe symptoms to the point where he was dyspneic with minimal efforts. EMS was called, he was found lethargic and bradycardic, he was placed on supplemental 02 per non re-breather mask and was transported to the ED.  On his initial ED physical examination his blood pressure was 118/66, HR 65, RR 22 and 02 saturation 100% on supplemental 02. Lungs with no wheezing or rhonchi, increased work of breathing, heart with S1 and S2 present and regular, with no gallops, or rubs, abdomen with no distention and positive lower extremity edema   Na 135, K 4.5 Cl 101, bicarbonate 25, glucose 128, bun 63, cr 3,18  Mg 2.9  BNP 183  High sensitive troponin 337, 278, 309 Wbc 4.3 hgb 10.4 plt 132  Urine analysis SG 1.009. protein 100, negative leukocytes, moderate hgb, RBC >50, wbc 6-10   Chest radiograph with cardiomegaly, bilateral hilar vascular congestion, bilateral pleural effusions, moderate to large on the left and small on the right.  CT chest, abdomen and pelvis with no aneurysm identified, moderate left pleural effusion and small right pleural effusion with compressive atelectasis on the left lower lobe and portion of the left upper lobe.  Mild bilateral perinephric stranding.  Mild diffuse body wall edema.  Bilateral renal cysts.  EKG 59 bpm, right axis deviation, qtc 510, sinus rhythm with first degree AV block, one non conducting P  wave, poor R R wave progression, with no significant ST segment or T wave changes.    -12/6: started on diuretics, Coude catheter placed for hematuria w/ CLots and increased discomfort before overt retention -12/7 more encephalopathic, placed on BiPAP, very mild respiratory acidosis, pCO2 in the 60-70 range -Complicated by worsening AKI, nephrology consulting, diuretic dose increased  12/13 improved volume status, but renal function not yet stable.  12/14 persistent elevation in BUN.   Assessment and Plan: * Acute on chronic diastolic CHF (congestive heart failure) (HCC) Echocardiogram with preserved LV systolic function with EF 65 to 70%, moderate LVH. RV systolic function preserved, LA with moderate dilatation, no significant valvular disease.   Urine output is 1,505 ml Systolic blood pressure range of 110 mmHg.   Possible transition to oral loop diuretic today  Limited further guideline directed medical therapy due to low GFR.  12/11 metolazone 5 mg   Acute hypoxemic hyercapnic respiratory failure, due to acute cardiogenic pulmonary edema, left pleural effusion, complicated with acute metabolic encephalopathy.  12/10 SP left thoracentesis 1100 ml removed.   Oxygenation is 98% on 6 L/min per [place].  Bipap at night and as needed during the day. Metabolic encephalopathy is improving.    Acute kidney injury superimposed on chronic kidney disease (HCC) CKD stage 3b. Hyponatremia,.   12/13 Urine analysis with SH 1.013, protein 100, large Hgb, negative leukocytes, 21-50 wbc and > 50 rbc.  Today renal function with serum cr at 3,47 with K at 4,4 and serum bicarbonate at 34.  Na 133 P 5.2 and BUN 162  Continue with P binders Patient continues to urinary catheter in place, plan for possible voiding trial prior to discharge, will keep it in for now to monitor urine output and response to diuretic therapy. Critical renal failure.    Anemia of chronic renal disease, with stable Hgb, no  clinical sings of bleeding. Chronic thrombocytopenia.   Second degree AV block, Mobitz type I Continue to hold on AV blockers. Continue telemetry.  BPH (benign prostatic hyperplasia) Continue with flomax and proscar  Bladder catheter in place.   Obesity, class 2 Calculated BMI is 37,4        Subjective: Patient is feeling better but continue very weak and deconditioned, positive lower extremity edema   Physical Exam: Vitals:   07/30/23 0008 07/30/23 0445 07/30/23 0513 07/30/23 0907  BP: 124/62 (!) 112/56  100/61  Pulse: 77 72  77  Resp: 17 18  17   Temp: 98.2 F (36.8 C) 98.2 F (36.8 C)  99 F (37.2 C)  TempSrc: Oral Oral  Oral  SpO2: 100% 93%  95%  Weight:   104.2 kg   Height:       Neurology awake and alert ENT with no pallor Cardiovascular with S1 and S2 present and regular with no gallops, or rubs. Positive systolic murmur at the right lower sternal border Respiratory with mild rales at bases with no wheezing or rhonchi Abdomen with no distention  Positive lower extremity edema ++ pitting  Data Reviewed:    Family Communication: no family at the bedside  Disposition: Status is: Inpatient Remains inpatient appropriate because: follow up renal function   Planned Discharge Destination: Home      Author: Coralie Keens, MD 07/30/2023 9:16 AM  For on call review [email or web address].

## 2023-07-31 DIAGNOSIS — N179 Acute kidney failure, unspecified: Secondary | ICD-10-CM | POA: Diagnosis not present

## 2023-07-31 DIAGNOSIS — I5033 Acute on chronic diastolic (congestive) heart failure: Secondary | ICD-10-CM | POA: Diagnosis not present

## 2023-07-31 DIAGNOSIS — I441 Atrioventricular block, second degree: Secondary | ICD-10-CM | POA: Diagnosis not present

## 2023-07-31 DIAGNOSIS — N401 Enlarged prostate with lower urinary tract symptoms: Secondary | ICD-10-CM | POA: Diagnosis not present

## 2023-07-31 LAB — RENAL FUNCTION PANEL
Albumin: 3.1 g/dL — ABNORMAL LOW (ref 3.5–5.0)
Anion gap: 12 (ref 5–15)
BUN: 176 mg/dL — ABNORMAL HIGH (ref 8–23)
CO2: 36 mmol/L — ABNORMAL HIGH (ref 22–32)
Calcium: 9.4 mg/dL (ref 8.9–10.3)
Chloride: 84 mmol/L — ABNORMAL LOW (ref 98–111)
Creatinine, Ser: 3.33 mg/dL — ABNORMAL HIGH (ref 0.61–1.24)
GFR, Estimated: 19 mL/min — ABNORMAL LOW (ref >60)
Glucose, Bld: 138 mg/dL — ABNORMAL HIGH (ref 70–99)
Phosphorus: 4.6 mg/dL (ref 2.5–4.6)
Potassium: 4.3 mmol/L (ref 3.5–5.1)
Sodium: 132 mmol/L — ABNORMAL LOW (ref 135–145)

## 2023-07-31 MED ORDER — FUROSEMIDE 10 MG/ML IJ SOLN
160.0000 mg | Freq: Three times a day (TID) | INTRAVENOUS | Status: DC
  Administered 2023-07-31 – 2023-08-01 (×4): 160 mg via INTRAVENOUS
  Filled 2023-07-31 (×2): qty 16
  Filled 2023-07-31: qty 4
  Filled 2023-07-31: qty 16
  Filled 2023-07-31: qty 10
  Filled 2023-07-31: qty 16
  Filled 2023-07-31: qty 10

## 2023-07-31 MED ORDER — METOLAZONE 5 MG PO TABS
5.0000 mg | ORAL_TABLET | Freq: Once | ORAL | Status: AC
  Administered 2023-07-31: 5 mg via ORAL
  Filled 2023-07-31: qty 1

## 2023-07-31 MED ORDER — SODIUM CHLORIDE 0.9 % IV SOLN: INTRAVENOUS | Status: AC | PRN

## 2023-07-31 NOTE — Plan of Care (Signed)

## 2023-07-31 NOTE — Progress Notes (Signed)
Patient ID: Andrew Schmitt, male   DOB: 08/08/1951, 72 y.o.   MRN: [id number] [facility] Progress Note   Assessment/ Plan:   1.  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  We will continue to hold ARB and SGLT2 inhibitor for now.  Response to diuretics diminished 12/9 but did respond to higher doses. Some concern for uremia intermittently but given fluctuating mental state I think delirium is more likely.  Urine sediment evaluated for ATN on 12/13 and fortunately no muddy brown casts  Patient's BUN continues to rise and creatinine is going down; this whether we held diuretics or not. We restarted diuretics on 12/14 given worsening overload again. Discussed today he is requiring high doses of diuretics with only moderate response and that dialysis is possibly going to be needed. Continue the course for now 2.  Acute exacerbation of congestive heart failure: volume status had improved but then held diuretics for 2 days.  Restarted IV Lasix 160 mg 3 times daily and and giving metolazone 5mg  today 3.  Encephalopathy: Possibly multifactorial with prominent component associated with his hypercarbia.  Now much improved despite rising BUN 4.  Anemia: Secondary to chronic disease including chronic kidney disease.  IV iron was given 5.  Hypertension: Blood pressure fairly low.  HTN meds stopped 6. Pleural effusion: s/p thora predominantly lymphocytic. Some malignancy concern. Awaiting study results.  Subjective:   Patient continues to feel well today.  Clear and oriented.  Urine output somewhat better but not robust despite aggressive diuresis.  Creatinine continues to improve with BUN increasing   Objective:   BP (!) 109/59 (BP Location: Left Arm)   Pulse 70   Temp 98.3 F (36.8 C) (Oral)   Resp 20   Ht 5\' 6"  (1.676 m)   Wt 103.3 kg    SpO2 100%   BMI 36.76 kg/m   Intake/Output Summary (Last 24 hours) at 07/31/2023 0915 Last data filed at 07/31/2023 4098 Gross per 24 hour  Intake 1480.11 ml  Output 2232 ml  Net -751.89 ml   Weight change: -0.9 kg  Physical Exam: Gen: Appears comfortable sitting up in recliner, nad CVS: normal rate, no murmurs Resp: Bilateral chest rise with no increased work of breathing Abd: Soft, obese, nontender, bowel sounds normal Ext: 2+ bilateral pitting lower extremity edema with venous stasis changes  Urine sediment exam on 12/13: 10-20 RBCs per high-powered field none with dysmorphic features, rare renal tubular epithelial cells, rare WBCs, no muddy brown casts, no cellular casts, 1 lipid-laden cast.  Imaging: No results found.   Labs: BMET Recent Labs  Lab 07/25/23 0252 07/26/23 0239 07/27/23 0256 07/28/23 0852 07/29/23 0249 07/30/23 0224 07/31/23 0308  NA 138 137 133* 135 134* 133* 132*  K 4.3 4.6 4.2 4.1 4.2 4.4 4.3  CL 95* 93* 91* 87* 86* 87* 84*  CO2 32 32 31 33* 35* 34* 36*  GLUCOSE 116* 114* 120* 137* 139* 130* 138*  BUN 81* 96* 109* 137* 149* 162* 176*  CREATININE 4.02* 4.15* 3.77* 3.73* 3.82* 3.47* 3.33*  CALCIUM 9.0 9.0 8.7* 9.1 9.2 9.3 9.4  PHOS  --   --   --   --  5.0* 5.2* 4.6   CBC Recent Labs  Lab 07/25/23 0252 07/26/23 0239 07/27/23 0256  WBC 7.0 5.7 5.5  HGB 8.9* 8.7* 8.3*  HCT 29.8* 29.5* 26.8*  MCV  97.1 96.1 94.4  PLT 80* 85* 96*    Medications:     calcium acetate  667 mg Oral TID WC   Chlorhexidine Gluconate Cloth  6 each Topical Daily   feeding supplement  237 mL Oral BID BM   finasteride  5 mg Oral Daily   guaiFENesin  600 mg Oral BID   heparin injection (subcutaneous)  5,000 Units Subcutaneous Q8H   polyethylene glycol  17 g Oral Daily   rosuvastatin  10 mg Oral Daily   tamsulosin  0.4 mg Oral Daily   Darnell Level  07/31/2023, 9:15 AM

## 2023-07-31 NOTE — Progress Notes (Signed)
Mobility Specialist Progress Note    07/31/23 1145  Mobility  Activity Ambulated with assistance in hallway  Level of Assistance Contact guard assist, steadying assist  Assistive Device Front wheel walker  Distance Ambulated (ft) 120 ft  Activity Response Tolerated well  Mobility Referral Yes  Mobility visit 1 Mobility  Mobility Specialist Start Time (ACUTE ONLY) 1128  Mobility Specialist Stop Time (ACUTE ONLY) 1145  Mobility Specialist Time Calculation (min) (ACUTE ONLY) 17 min   Pt received on BSC and agreeable. No complaints on walk. Returned to chair with call bell in reach. Family present.   [place] Mobility Specialist  Please Neurosurgeon or Rehab Office at [phone]

## 2023-07-31 NOTE — Plan of Care (Signed)
  Problem: Health Behavior/Discharge Planning: Goal: Ability to manage health-related needs will improve Outcome: Progressing   Problem: Clinical Measurements: Goal: Respiratory complications will improve Outcome: Progressing   Problem: Activity: Goal: Risk for activity intolerance will decrease Outcome: Progressing   Problem: Nutrition: Goal: Adequate nutrition will be maintained Outcome: Progressing   Problem: Coping: Goal: Level of anxiety will decrease Outcome: Progressing   Problem: Elimination: Goal: Will not experience complications related to urinary retention Outcome: Progressing

## 2023-07-31 NOTE — Progress Notes (Signed)
Progress Note   Patient: Andrew Schmitt:[id number] DOB: 11-19-1950 DOA: 07/21/2023     9 DOS: the patient was seen and examined on 07/31/2023   Brief hospital course: Andrew Schmitt was admitted to the hospital with the working diagnosis of acute heart failure decompensation.   [name] with chronic diastolic CHF, hypertension, dyslipidemia, ascending aortic aneurysm, BPH, CKD 3b/4, chronic anemia, gout, obesity presented to the ED with progressive weakness and shortness of breath for 7 days prior to admission. Worsening dyspnea on exertion, and lower extremity edema. Severe symptoms to the point where he was dyspneic with minimal efforts. EMS was called, he was found lethargic and bradycardic, he was placed on supplemental 02 per non re-breather mask and was transported to the ED.  On his initial ED physical examination his blood pressure was 118/66, HR 65, RR 22 and 02 saturation 100% on supplemental 02. Lungs with no wheezing or rhonchi, increased work of breathing, heart with S1 and S2 present and regular, with no gallops, or rubs, abdomen with no distention and positive lower extremity edema   Na 135, K 4.5 Cl 101, bicarbonate 25, glucose 128, bun 63, cr 3,18  Mg 2.9  BNP 183  High sensitive troponin 337, 278, 309 Wbc 4.3 hgb 10.4 plt 132  Urine analysis SG 1.009. protein 100, negative leukocytes, moderate hgb, RBC >50, wbc 6-10   Chest radiograph with cardiomegaly, bilateral hilar vascular congestion, bilateral pleural effusions, moderate to large on the left and small on the right.  CT chest, abdomen and pelvis with no aneurysm identified, moderate left pleural effusion and small right pleural effusion with compressive atelectasis on the left lower lobe and portion of the left upper lobe.  Mild bilateral perinephric stranding.  Mild diffuse body wall edema.  Bilateral renal cysts.  EKG 59 bpm, right axis deviation, qtc 510, sinus rhythm with first degree AV block, one non conducting P  wave, poor R R wave progression, with no significant ST segment or T wave changes.    -12/6: started on diuretics, Coude catheter placed for hematuria w/ CLots and increased discomfort before overt retention -12/7 more encephalopathic, placed on BiPAP, very mild respiratory acidosis, pCO2 in the 60-70 range -Complicated by worsening AKI, nephrology consulting, diuretic dose increased  12/13 improved volume status, but renal function not yet stable.  12/14 persistent elevation in BUN.  12/15 resumed diuretic therapy   Assessment and Plan: * Acute on chronic diastolic CHF (congestive heart failure) (HCC) Echocardiogram with preserved LV systolic function with EF 65 to 70%, moderate LVH. RV systolic function preserved, LA with moderate dilatation, no significant valvular disease.   Urine output is 2,230 ml Systolic blood pressure range of 110 mmHg.   Limited further guideline directed medical therapy due to low GFR.  12/11 metolazone 5 mg  Resume on IV furosemide 160 mg tid   Acute hypoxemic hyercapnic respiratory failure, due to acute cardiogenic pulmonary edema, left pleural effusion, complicated with acute metabolic encephalopathy.  12/10 SP left thoracentesis 1100 ml removed.   Oxygenation is 100 % on 2 L/min per [place].  Bipap at night and as needed during the day. Metabolic encephalopathy is improving.    Acute kidney injury superimposed on chronic kidney disease (HCC) CKD stage 3b. Hyponatremia,.   12/13 Urine analysis with SH 1.013, protein 100, large Hgb, negative leukocytes, 21-50 wbc and > 50 rbc.  Renal function today with serum cr at 3,3 with K at 4.3 and serum bicarbonate at 36, with BUN 176 and  Na 132.   Continue with P binders Patient continues with urinary catheter in place, plan for possible voiding trial prior to discharge, will keep it in for now to monitor urine output and response to diuretic therapy. Critical renal failure.    Anemia of chronic renal disease,  with stable Hgb, no clinical sings of bleeding. Check cell count in am.  Chronic thrombocytopenia.   Second degree AV block, Mobitz type I Continue to hold on AV blockers. Continue telemetry.  BPH (benign prostatic hyperplasia) Continue with flomax and proscar  Bladder catheter in place.   Obesity, class 2 Calculated BMI is 37,4        Subjective: Patient with no chest pain, dyspnea has been stable, at the time of my examination somnolent. His daughter at the bedside   Physical Exam: Vitals:   07/31/23 0357 07/31/23 0519 07/31/23 0700 07/31/23 0720  BP: 115/61  (!) 109/59   Pulse: 80  75 70  Resp:   20   Temp: 98.2 F (36.8 C)  98.3 F (36.8 C)   TempSrc: Oral  Oral   SpO2: 99%  99% 100%  Weight:  103.3 kg    Height:       Neurology somnolent but easy to arouse  ENT with mild pallor Cardiovascular with S1 and S2 present and regular with no gallops, rubs or murmurs Respiratory with scattered rales with no wheezing or rhonchi, on anterior auscultation  Abdomen with no distention  Positive lower extremity edema ++  Data Reviewed:    Family Communication: I spoke with patient's daughter at the bedside, we talked in detail about patient's condition, plan of care and prognosis and all questions were addressed.   Disposition: Status is: Inpatient Remains inpatient appropriate because: IV diuresis   Planned Discharge Destination: Home    Author: Coralie Keens, MD 07/31/2023 2:13 PM  For on call review [email or web address].

## 2023-07-31 NOTE — Plan of Care (Signed)

## 2023-07-31 NOTE — Progress Notes (Deleted)
   07/31/23 2014  BiPAP/CPAP/SIPAP  BiPAP/CPAP/SIPAP Pt Type Adult  BiPAP/CPAP/SIPAP V60  BiPAP/CPAP /SiPAP Vitals  Pulse Rate 74  Resp 18  SpO2 95 %  MEWS Score/Color  MEWS Score 0  MEWS Score Color Green   Pt stated he wear 02 via [place] while he sleeps. Pt doesn't want to wear BIPAP at night

## 2023-07-31 NOTE — Progress Notes (Signed)
   07/31/23 1955  BiPAP/CPAP/SIPAP  BiPAP/CPAP/SIPAP Pt Type Adult  BiPAP/CPAP/SIPAP V60   Pt has PRN BIPAP, machine in room. Pt stated he has not been wearing the BIPAP machine. He only uses 02 by [place] at night. No distress noted at this time

## 2023-08-01 ENCOUNTER — Inpatient Hospital Stay (HOSPITAL_COMMUNITY): Payer: Medicare PPO

## 2023-08-01 DIAGNOSIS — J90 Pleural effusion, not elsewhere classified: Secondary | ICD-10-CM | POA: Diagnosis not present

## 2023-08-01 DIAGNOSIS — I5033 Acute on chronic diastolic (congestive) heart failure: Secondary | ICD-10-CM | POA: Diagnosis not present

## 2023-08-01 DIAGNOSIS — N179 Acute kidney failure, unspecified: Secondary | ICD-10-CM | POA: Diagnosis not present

## 2023-08-01 DIAGNOSIS — J810 Acute pulmonary edema: Secondary | ICD-10-CM | POA: Diagnosis not present

## 2023-08-01 DIAGNOSIS — N401 Enlarged prostate with lower urinary tract symptoms: Secondary | ICD-10-CM | POA: Diagnosis not present

## 2023-08-01 DIAGNOSIS — I441 Atrioventricular block, second degree: Secondary | ICD-10-CM | POA: Diagnosis not present

## 2023-08-01 LAB — CBC
HCT: 26.3 % — ABNORMAL LOW (ref 39.0–52.0)
Hemoglobin: 8.1 g/dL — ABNORMAL LOW (ref 13.0–17.0)
MCH: 29.6 pg (ref 26.0–34.0)
MCHC: 30.8 g/dL (ref 30.0–36.0)
MCV: 96 fL (ref 80.0–100.0)
Platelets: 151 10*3/uL (ref 150–400)
RBC: 2.74 MIL/uL — ABNORMAL LOW (ref 4.22–5.81)
RDW: 14.6 % (ref 11.5–15.5)
WBC: 7.5 10*3/uL (ref 4.0–10.5)
nRBC: 0.3 % — ABNORMAL HIGH (ref 0.0–0.2)

## 2023-08-01 LAB — CYTOLOGY - NON PAP

## 2023-08-01 LAB — RENAL FUNCTION PANEL
Albumin: 3.3 g/dL — ABNORMAL LOW (ref 3.5–5.0)
Anion gap: 12 (ref 5–15)
BUN: 186 mg/dL — ABNORMAL HIGH (ref 8–23)
CO2: 36 mmol/L — ABNORMAL HIGH (ref 22–32)
Calcium: 9.7 mg/dL (ref 8.9–10.3)
Chloride: 84 mmol/L — ABNORMAL LOW (ref 98–111)
Creatinine, Ser: 3.14 mg/dL — ABNORMAL HIGH (ref 0.61–1.24)
GFR, Estimated: 20 mL/min — ABNORMAL LOW (ref >60)
Glucose, Bld: 121 mg/dL — ABNORMAL HIGH (ref 70–99)
Phosphorus: 3.2 mg/dL (ref 2.5–4.6)
Potassium: 4.3 mmol/L (ref 3.5–5.1)
Sodium: 132 mmol/L — ABNORMAL LOW (ref 135–145)

## 2023-08-01 LAB — SURGICAL PATHOLOGY

## 2023-08-01 LAB — FLOW CYTOMETRY REQUEST - FLUID (INPATIENT)

## 2023-08-01 MED ORDER — TORSEMIDE 100 MG PO TABS
100.0000 mg | ORAL_TABLET | Freq: Two times a day (BID) | ORAL | Status: DC
  Administered 2023-08-01 – 2023-08-02 (×2): 100 mg via ORAL
  Filled 2023-08-01 (×2): qty 1

## 2023-08-01 NOTE — Progress Notes (Signed)
Patient ID: Andrew Schmitt, male   DOB: [birth year], 72 y.o.   MRN: [id number] [facility] Progress Note   Assessment/ Plan:   1.  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   Continue to hold ARB and SGLT2 inhibitor for now.   Diuresed well in past 24h, change to PO Torsemide 100 BID today Progressive alkalosis probably related to diuretics Discussed critical imprtance of Na and H2O restriction No clear uremia, only real indication for RRT is azotemia but everything else doing well -- no HD at this time  2.  Acute exacerbation of HFpEF Vol status improved, diuretics as above 3.  Encephalopathy: Seems stable  4.  Anemia: Secondary to chronic disease including chronic kidney disease.  IV iron was given 5.  Hypertension: Blood pressure fairly low.  HTN meds stopped 6. Pleural effusion: s/p thora predominantly lymphocytic. Some malignancy concern. Awaiting study results.  Subjective:    Feels well, sleeping in chair but that isn't unusual No sig exertional dyspnea Made 4.6L UOP on TID IV Lasix 160mg  Weights down, total of 13kg BUN up to 186, SCr down to 3.1 K 4.3 BPs ok Trace to 1+ LEE at most D/w dtr Felecia Shelling (by phone, ED MD in [place]) and son Kemon (sp?) at bedside Son with questions about using his blood type to determine his diet    Objective:   BP 116/63 (BP Location: Left Arm)   Pulse 76   Temp 98 F (36.7 C) (Oral)   Resp 20   Ht 5\' 6"  (1.676 m)   Wt 102.5 kg   SpO2 93%   BMI 36.48 kg/m   Intake/Output Summary (Last 24 hours) at 08/01/2023 1158 Last data filed at 08/01/2023 4098 Gross per 24 hour  Intake 1308.87 ml  Output 4600 ml  Net -3291.13 ml   Weight change: -0.787 kg  Physical Exam: Gen: Appears comfortable sitting up in recliner, nad CVS: normal rate, no murmurs Resp: Bilateral chest rise with no  increased work of breathing Abd: Soft, obese, nontender, bowel sounds normal Ext: trace to 1+ bilateral pitting lower extremity edema   Urine sediment exam on 12/13: 10-20 RBCs per high-powered field none with dysmorphic features, rare renal tubular epithelial cells, rare WBCs, no muddy brown casts, no cellular casts, 1 lipid-laden cast.  Imaging: No results found.   Labs: BMET Recent Labs  Lab 07/26/23 0239 07/27/23 0256 07/28/23 0852 07/29/23 0249 07/30/23 0224 07/31/23 0308 08/01/23 0256  NA 137 133* 135 134* 133* 132* 132*  K 4.6 4.2 4.1 4.2 4.4 4.3 4.3  CL 93* 91* 87* 86* 87* 84* 84*  CO2 32 31 33* 35* 34* 36* 36*  GLUCOSE 114* 120* 137* 139* 130* 138* 121*  BUN 96* 109* 137* 149* 162* 176* 186*  CREATININE 4.15* 3.77* 3.73* 3.82* 3.47* 3.33* 3.14*  CALCIUM 9.0 8.7* 9.1 9.2 9.3 9.4 9.7  PHOS  --   --   --  5.0* 5.2* 4.6 3.2   CBC Recent Labs  Lab 07/26/23 0239 07/27/23 0256 08/01/23 0256  WBC 5.7 5.5 7.5  HGB 8.7* 8.3* 8.1*  HCT 29.5* 26.8* 26.3*  MCV 96.1 94.4 96.0  PLT 85* 96* 151    Medications:     calcium acetate  667 mg Oral TID WC   Chlorhexidine Gluconate Cloth  6 each Topical Daily   feeding supplement  237 mL Oral  BID BM   finasteride  5 mg Oral Daily   guaiFENesin  600 mg Oral BID   heparin injection (subcutaneous)  5,000 Units Subcutaneous Q8H   polyethylene glycol  17 g Oral Daily   rosuvastatin  10 mg Oral Daily   tamsulosin  0.4 mg Oral Daily   torsemide  100 mg Oral BID   Arita Miss  08/01/2023, 11:58 AM

## 2023-08-01 NOTE — Progress Notes (Addendum)
Progress Note   Patient: Andrew Schmitt:[id number] DOB: 1951-02-23 DOA: 07/21/2023     10 DOS: the patient was seen and examined on 08/01/2023   Brief hospital course: Mr. Rummell was admitted to the hospital with the working diagnosis of acute heart failure decompensation.   [name] with chronic diastolic CHF, hypertension, dyslipidemia, ascending aortic aneurysm, BPH, CKD 3b/4, chronic anemia, gout, obesity presented to the ED with progressive weakness and shortness of breath for 7 days prior to admission. Worsening dyspnea on exertion, and lower extremity edema. Severe symptoms to the point where he was dyspneic with minimal efforts. EMS was called, he was found lethargic and bradycardic, he was placed on supplemental 02 per non re-breather mask and was transported to the ED.  On his initial ED physical examination his blood pressure was 118/66, HR 65, RR 22 and 02 saturation 100% on supplemental 02. Lungs with no wheezing or rhonchi, increased work of breathing, heart with S1 and S2 present and regular, with no gallops, or rubs, abdomen with no distention and positive lower extremity edema   Na 135, K 4.5 Cl 101, bicarbonate 25, glucose 128, bun 63, cr 3,18  Mg 2.9  BNP 183  High sensitive troponin 337, 278, 309 Wbc 4.3 hgb 10.4 plt 132  Urine analysis SG 1.009. protein 100, negative leukocytes, moderate hgb, RBC >50, wbc 6-10   Chest radiograph with cardiomegaly, bilateral hilar vascular congestion, bilateral pleural effusions, moderate to large on the left and small on the right.  CT chest, abdomen and pelvis with no aneurysm identified, moderate left pleural effusion and small right pleural effusion with compressive atelectasis on the left lower lobe and portion of the left upper lobe.  Mild bilateral perinephric stranding.  Mild diffuse body wall edema.  Bilateral renal cysts.  EKG 59 bpm, right axis deviation, qtc 510, sinus rhythm with first degree AV block, one non conducting P  wave, poor R R wave progression, with no significant ST segment or T wave changes.    -12/6: started on diuretics, Coude catheter placed for hematuria w/ CLots and increased discomfort before overt retention -12/7 more encephalopathic, placed on BiPAP, very mild respiratory acidosis, pCO2 in the 60-70 range -Complicated by worsening AKI, nephrology consulting, diuretic dose increased  12/13 improved volume status, but renal function not yet stable.  12/14 persistent elevation in BUN.  12/15 resumed diuretic therapy  12/26 improved volume status and transitioned to oral loop diuretic therapy.   Assessment and Plan: * Acute on chronic diastolic CHF (congestive heart failure) (HCC) Echocardiogram with preserved LV systolic function with EF 65 to 70%, moderate LVH. RV systolic function preserved, LA with moderate dilatation, no significant valvular disease.   Urine output is 4,600 ml Systolic blood pressure range of 110 mmHg range.   Limited further guideline directed medical therapy due to low GFR.  12/11 metolazone 5 mg  Transitioned to torsemide 100 mg bid today.   Acute hypoxemic hyercapnic respiratory failure, due to acute cardiogenic pulmonary edema, left pleural effusion, complicated with acute metabolic encephalopathy.  12/10 SP left thoracentesis 1100 ml removed.   Oxygenation is 93 % on room air.   Bipap at night and as needed during the day. Metabolic encephalopathy continue to improve.    Acute kidney injury superimposed on chronic kidney disease (HCC) CKD stage 3b. Hyponatremia,.   12/13 Urine analysis with SH 1.013, protein 100, large Hgb, negative leukocytes, 21-50 wbc and > 50 rbc.   Today serum cr is down to 3,14  but BUN continue rising to 186,  K is 4,3 and serum bicarbonate at 36  Na 132 P 3.2   Change to renal diet.   Continue with P binders Patient continues with urinary catheter in place, plan for possible voiding trial prior to discharge, will keep it in  for now to monitor urine output and response to diuretic therapy. Critical renal failure.    Anemia of chronic renal disease, with stable Hgb, no clinical sings of bleeding. Hgb is 8,1 and plt 151 with wbc at 7.5   Second degree AV block, Mobitz type I Continue to hold on AV blockers. Continue telemetry.  BPH (benign prostatic hyperplasia) Continue with flomax and proscar  Bladder catheter in place.   Obesity, class 2 Calculated BMI is 37,4       Subjective: Patient with improvement in dyspnea and has been able to ambulate with physical therapy in the hallway   Physical Exam: Vitals:   08/01/23 0057 08/01/23 0434 08/01/23 0747 08/01/23 1128  BP: 119/60 128/63 (!) 129/57 116/63  Pulse: 75 77 80 76  Resp: 18 16 20 20   Temp: 98.5 F (36.9 C) 98.4 F (36.9 C) 98.4 F (36.9 C) 98 F (36.7 C)  TempSrc: Axillary Oral Oral Oral  SpO2: 94% 92% 93% 93%  Weight:  102.5 kg    Height:       Neurology awake and alert ENT with mild pallor with no icterus Cardiovascular with S1 and S2 present and regular with no gallops, rubs or murmurs Respiratory with no rales or wheezing, on anterior auscultation  Abdomen with no distention  Trace lower extremity edema Foley catheter in place.  Data Reviewed:    Family Communication: no family at the bedside   Disposition: Status is: Inpatient Remains inpatient appropriate because: heart failure   Planned Discharge Destination: Home    Author: Coralie Keens, MD 08/01/2023 3:46 PM  For on call review [email or web address].

## 2023-08-01 NOTE — Progress Notes (Signed)
NAME:  SHIKHAR PLUCINSKI, MRN:  [id number], DOB:  18-Apr-1951, LOS: 10 ADMISSION DATE:  07/21/2023, CONSULTATION DATE:  07/23/2023 REFERRING MD:  Dr. Jomarie Longs, CHIEF COMPLAINT:  Pulmonary edema    History of Present Illness:  Andrew Schmitt is a 72 year old male with a past medical history significant for diastolic CHF CKD stage IIIa, CAD, HTN, HLD, gout, and obesity who presented to the ED 12/6 for complaints of progressive weakness, shortness of breath, and lower extremity edema.  Workup on arrival consistent with acute exacerbation of CHF.  Patient was admitted per [facility] with cardiology consult.  Overnight 12/6 patient's mentation worsened with more somnolence prompting ABG which revealed hypoxia and hypercapnia.  BiPAP applied.  PCCM consulted this a.m.  Pertinent  Medical History  Diastolic CHF CKD stage IIIa, CAD, HTN, HLD, gout, and obesity   Significant Hospital Events: Including procedures, antibiotic start and stop dates in addition to other pertinent events   12/6 presented with weakness and shortness of breath found to be in acute exasperation CHF with AKI superimposed on CKD stage IIIa 12/7 worsening mentation secondary to hypercapnia in the setting of pulmonary edema, PCCM consulted 12/8 low-grade temp overnight with some hematuria secondary to Foley trauma 12/9 wearing BiPAP overnight , better mentation per nursing 12/10 left thora- 1100cc removed, bloody-appearing  Interim History / Subjective:  Denies complaints. On RA today.   Objective   Blood pressure 116/63, pulse 76, temperature 98 F (36.7 C), temperature source Oral, resp. rate 20, height 5\' 6"  (1.676 m), weight 102.5 kg, SpO2 93%.        Intake/Output Summary (Last 24 hours) at 08/01/2023 1345 Last data filed at 08/01/2023 1212 Gross per 24 hour  Intake 1068.87 ml  Output 5900 ml  Net -4831.13 ml   Filed Weights   07/30/23 0513 07/31/23 0519 08/01/23 0434  Weight: 104.2 kg 103.3 kg 102.5 kg     Examination: Chronically ill appearing man lying in the recliner in NAD [place]/AT, eyes anicteric Breathing comfortably on RA, CTAB S1S2, RRR Abd obese, soft No thigh edema, still has significant pedal edema.  Skin warm, dry, no rashes.  Confused.   BUN 186 Cr 3.14 WBC 7.5 H/H 8.1/26.3 Platelets 151 CXR personally reviewed Cytology: no malignant cells Flow cytometry: Pleural fluid, flow cytometry:  -  B cells with a mild kappa excess of uncertain clinical significance.  Note: The cells consist of only 4% of the total cells.  The cells have a mild Excess but with no immunophenotypic abnormality.  This finding is of uncertain clinical significance and repeat flow cytometry could be performed if the pleural effusion persists/returns.    CXR personally reviewed> improving left effusion   Resolved Hospital Problem list     Assessment & Plan:  Acute decompensated HFpEF with volume overload Second-degree AV block Essential hypertension Hyperlipidemia Elevated troponin with history of CAD -diuresis per Nephrology -crestor  Acute hypoxic and hypercapnic respiratory failure> resolved Acute pulmonary edema> improved L>R pleural effusions; R resolved with diuresis and left not resolving, confirmed to be exudative on thoracentesis. -cytology negative; if reaccumulated fluid may need to drain and re-send -diuresis per nephrology -CXR today; reviewed  Acute kidney injury on CKD stage IIIb; concern for component of cardiorenal syndrome Significant azotemia -strict I/O with fluid and sodium restriction -appreciate Nephrology's management, con't torsemide -con't holding ARB, SGLT2,   Chronic anemia Thrombocytopenia Protein calorie malnutrition Hypoalbuminemia -per primary  Daughter Fanta updated via phone.    Best Practice (right click and "Reselect  all SmartList Selections" daily)  Per primary  Critical care time: NA    Steffanie Dunn, DO 08/01/23 4:53 PM [place]  [facility]  For contact information, see Amion. If no response to pager, please call PCCM consult pager. After hours, 7PM- 7AM, please call [place]ink.

## 2023-08-01 NOTE — Progress Notes (Signed)
Occupational Therapy Treatment Patient Details Name: Andrew Schmitt MRN: [id number] DOB: Jan 19, 1951 Today's Date: 08/01/2023   History of present illness Patient is a 72 year old with acute on chronic diastolic CHF, Acute hypoxemic hyercapnic respiratory failure, acute cardiogenic pulmonary edema, left pleural effusion, metabolic encephalopathy. S/p left thoracentesis.   OT comments  Pt received on BSC, able to easily pivot back to chair with RW but required hygiene assistance. Attempted to engage pt in other ADL/mobility activity though pt declined d/t feeling cold. Daughter called during session, encouraged pt to eat breakfast that was present but pt also declined this at the time. Pending acute improvements in participation, pt may benefit from [facility] at [place].       If plan is discharge home, recommend the following:  A little help with walking and/or transfers;A lot of help with bathing/dressing/bathroom;Assistance with cooking/housework;Assist for transportation;Help with stairs or ramp for entrance   Equipment Recommendations  BSC/3in1;Tub/shower seat    Recommendations for Other Services      Precautions / Restrictions Precautions Precautions: Fall Restrictions Weight Bearing Restrictions Per Provider Order: No       Mobility Bed Mobility               General bed mobility comments: on BSC on entry    Transfers Overall transfer level: Needs assistance Equipment used: Rolling walker (2 wheels) Transfers: Sit to/from Stand, Bed to chair/wheelchair/BSC Sit to Stand: Supervision     Step pivot transfers: Supervision     General transfer comment: able to stand from [facility] using RW and step to chair easily     Balance Overall balance assessment: Needs assistance Sitting-balance support: Single extremity supported, No upper extremity supported, Feet supported Sitting balance-Leahy Scale: Good     Standing balance support: Bilateral upper extremity supported,  During functional activity, Reliant on assistive device for balance Standing balance-Leahy Scale: Poor                             ADL either performed or assessed with clinical judgement   ADL Overall ADL's : Needs assistance/impaired                         Toilet Transfer: Supervision/safety;Stand-pivot;BSC/3in1;Rolling walker (2 wheels) Toilet Transfer Details (indicate cue type and reason): from [facility] back to recliner, no LOB Toileting- Clothing Manipulation and Hygiene: Moderate assistance;Sitting/lateral lean;Sit to/from stand Toileting - Clothing Manipulation Details (indicate cue type and reason): assist for hygiene and clothing mgmt. Noted back gown soiled w/ small area of stool though pt declined OT assist to change gown       General ADL Comments: Pt declined further ADL/mobility after getting off of BSC. Daughter called during session, encouraged pt to eat breakfast, drink water but pt reports too many interruptions and needs to eat once appetite occurs - declined setup for breakfast on OT exit    Extremity/Trunk Assessment Upper Extremity Assessment Upper Extremity Assessment: Overall WFL for tasks assessed;Right hand dominant   Lower Extremity Assessment Lower Extremity Assessment: Defer to PT evaluation        Vision   Vision Assessment?: No apparent visual deficits   Perception     Praxis      Cognition Arousal: Alert Behavior During Therapy: WFL for tasks assessed/performed, Flat affect Overall Cognitive Status: No family/caregiver present to determine baseline cognitive functioning  General Comments: Largely WFL but with mildly decreased insight into deficts and decreased understanding of role therapy in hospital. requires cues to initiate tasks and to participate        Exercises      Shoulder Instructions       General Comments pt reported being cold - offered warm blankets along  with pt's already present blankets    Pertinent Vitals/ Pain       Pain Assessment Pain Assessment: No/denies pain  Home Living                                          Prior Functioning/Environment              Frequency  Min 1X/week        Progress Toward Goals  OT Goals(current goals can now be found in the care plan section)  Progress towards OT goals: OT to reassess next treatment  Acute Rehab OT Goals Patient Stated Goal: get warm and re-energized OT Goal Formulation: With patient Time For Goal Achievement: 08/12/23 Potential to Achieve Goals: Good ADL Goals Pt Will Perform Lower Body Bathing: with supervision;sitting/lateral leans;sit to/from stand;with adaptive equipment Pt Will Perform Lower Body Dressing: with supervision;with adaptive equipment;sit to/from stand Pt Will Transfer to Toilet: with supervision;ambulating;bedside commode Pt Will Perform Toileting - Clothing Manipulation and hygiene: with supervision;sitting/lateral leans;sit to/from stand Pt/caregiver will Perform Home Exercise Program: Increased strength;Both right and left upper extremity;With theraband;With written HEP provided;Independently Additional ADL Goal #1: Patient will demonstrate ability to Independently state 3 strategies for improved management of CHF with handout provided.  Plan      Co-evaluation                 AM-PAC OT "6 Clicks" Daily Activity     Outcome Measure   Help from another person eating meals?: None Help from another person taking care of personal grooming?: A Little Help from another person toileting, which includes using toliet, bedpan, or urinal?: A Lot Help from another person bathing (including washing, rinsing, drying)?: A Lot Help from another person to put on and taking off regular upper body clothing?: None Help from another person to put on and taking off regular lower body clothing?: A Lot 6 Click Score: 17    End of  Session Equipment Utilized During Treatment: Rolling walker (2 wheels)  OT Visit Diagnosis: Unsteadiness on feet (R26.81);Muscle weakness (generalized) (M62.81);Other (comment)   Activity Tolerance Other (comment) (limited by reportedly feeling cold)   Patient Left in chair;with call bell/phone within reach   Nurse Communication          Time: [phone] OT Time Calculation (min): 19 min  Charges: OT General Charges $OT Visit: 1 Visit OT Treatments $Self Care/Home Management : 8-22 mins  Bradd Canary, OTR/L Acute Rehab Services Office: [phone]   Lorre Munroe 08/01/2023, 9:35 AM

## 2023-08-01 NOTE — Progress Notes (Signed)
   08/01/23 2019  BiPAP/CPAP/SIPAP  BiPAP/CPAP/SIPAP Pt Type Adult  BiPAP/CPAP/SIPAP V60  BiPAP/CPAP /SiPAP Vitals  Pulse Rate 76  Resp 18  SpO2 96 %   Pt has PRN Bipap orders, no distress noted at this time

## 2023-08-01 NOTE — Progress Notes (Signed)
   08/01/23 2019  BiPAP/CPAP/SIPAP  BiPAP/CPAP/SIPAP Pt Type Adult  BiPAP/CPAP/SIPAP V60  BiPAP/CPAP /SiPAP Vitals  Pulse Rate 76  Resp 18  SpO2 96 %  MEWS Score/Color  MEWS Score 0  MEWS Score Color Green    Pt has PRN Bipap orders.no distress noted at this time

## 2023-08-01 NOTE — Progress Notes (Signed)
Physical Therapy Treatment Patient Details Name: Andrew Schmitt MRN: [id number] DOB: 1951/02/25 Today's Date: 08/01/2023   History of Present Illness Patient is a 72 year old with acute on chronic diastolic CHF, Acute hypoxemic hyercapnic respiratory failure, acute cardiogenic pulmonary edema, left pleural effusion, metabolic encephalopathy. S/p left thoracentesis.    PT Comments  Patient is agreeable to PT session. Increased activity tolerance and progression of mobility. Patient ambulated in hallway with rolling walker with CGA for safety. No shortness of breath noted with walking. Recommend to continue PT to maximize independence and decrease caregiver burden.    If plan is discharge home, recommend the following: A little help with walking and/or transfers;A little help with bathing/dressing/bathroom;Assist for transportation;Help with stairs or ramp for entrance;Assistance with cooking/housework   Can travel by private vehicle        Equipment Recommendations  Hospital bed;BSC/3in1 (pending progress)    Recommendations for Other Services       Precautions / Restrictions Precautions Precautions: Fall Restrictions Weight Bearing Restrictions Per Provider Order: No     Mobility  Bed Mobility               General bed mobility comments: not assessed as patient in chair on arrival and post session    Transfers Overall transfer level: Needs assistance Equipment used: Rolling walker (2 wheels) Transfers: Sit to/from Stand Sit to Stand: Supervision                Ambulation/Gait Ambulation/Gait assistance: Contact guard assist Gait Distance (Feet): 120 Feet Assistive device: Rolling walker (2 wheels) Gait Pattern/deviations: Step-through pattern, Decreased stride length Gait velocity: decreased     General Gait Details: very slow cadence initially with increased gait speed with increased ambulation distance. encouraged rolling walker for safety with  mobility. no significant shortness of breath noted.   Stairs             Wheelchair Mobility     Tilt Bed    Modified Rankin (Stroke Patients Only)       Balance Overall balance assessment: Needs assistance Sitting-balance support: Single extremity supported, No upper extremity supported, Feet supported Sitting balance-Leahy Scale: Good     Standing balance support: Bilateral upper extremity supported, During functional activity, Reliant on assistive device for balance Standing balance-Leahy Scale: Poor Standing balance comment: using rolling walker for support in standing                            Cognition Arousal: Alert Behavior During Therapy: WFL for tasks assessed/performed, Flat affect Overall Cognitive Status: No family/caregiver present to determine baseline cognitive functioning                                          Exercises      General Comments        Pertinent Vitals/Pain Pain Assessment Pain Assessment: No/denies pain    Home Living                          Prior Function            PT Goals (current goals can now be found in the care plan section) Acute Rehab PT Goals Patient Stated Goal: family goal to return home with family support and HHPT PT Goal Formulation: With patient Time For  Goal Achievement: 08/12/23 Potential to Achieve Goals: Good Progress towards PT goals: Progressing toward goals    Frequency    Min 1X/week      PT Plan      Co-evaluation              AM-PAC PT "6 Clicks" Mobility   Outcome Measure  Help needed turning from your back to your side while in a flat bed without using bedrails?: A Little Help needed moving from lying on your back to sitting on the side of a flat bed without using bedrails?: A Little Help needed moving to and from a bed to a chair (including a wheelchair)?: A Little Help needed standing up from a chair using your arms (e.g.,  wheelchair or bedside chair)?: A Little Help needed to walk in hospital room?: A Little Help needed climbing 3-5 steps with a railing? : A Lot 6 Click Score: 17    End of Session   Activity Tolerance: Patient tolerated treatment well Patient left: in chair;with call bell/phone within reach Nurse Communication: Mobility status PT Visit Diagnosis: Unsteadiness on feet (R26.81);Muscle weakness (generalized) (M62.81)     Time: 7253-6644 PT Time Calculation (min) (ACUTE ONLY): 23 min  Charges:    $Therapeutic Activity: 23-37 mins PT General Charges $$ ACUTE PT VISIT: 1 Visit                     Donna Bernard, PT, MPT    Ina Homes 08/01/2023, 1:32 PM

## 2023-08-02 DIAGNOSIS — I441 Atrioventricular block, second degree: Secondary | ICD-10-CM | POA: Diagnosis not present

## 2023-08-02 DIAGNOSIS — I5033 Acute on chronic diastolic (congestive) heart failure: Secondary | ICD-10-CM | POA: Diagnosis not present

## 2023-08-02 DIAGNOSIS — N179 Acute kidney failure, unspecified: Secondary | ICD-10-CM | POA: Diagnosis not present

## 2023-08-02 DIAGNOSIS — N401 Enlarged prostate with lower urinary tract symptoms: Secondary | ICD-10-CM | POA: Diagnosis not present

## 2023-08-02 LAB — RENAL FUNCTION PANEL
Albumin: 3.5 g/dL (ref 3.5–5.0)
Anion gap: 15 (ref 5–15)
BUN: 191 mg/dL — ABNORMAL HIGH (ref 8–23)
CO2: 37 mmol/L — ABNORMAL HIGH (ref 22–32)
Calcium: 10.3 mg/dL (ref 8.9–10.3)
Chloride: 82 mmol/L — ABNORMAL LOW (ref 98–111)
Creatinine, Ser: 3.42 mg/dL — ABNORMAL HIGH (ref 0.61–1.24)
GFR, Estimated: 18 mL/min — ABNORMAL LOW (ref >60)
Glucose, Bld: 124 mg/dL — ABNORMAL HIGH (ref 70–99)
Phosphorus: 4.1 mg/dL (ref 2.5–4.6)
Potassium: 4.6 mmol/L (ref 3.5–5.1)
Sodium: 134 mmol/L — ABNORMAL LOW (ref 135–145)

## 2023-08-02 MED ORDER — SALINE SPRAY 0.65 % NA SOLN: 1.0000 | NASAL | Status: DC | PRN

## 2023-08-02 MED ORDER — POLYETHYLENE GLYCOL 3350 17 G PO PACK
17.0000 g | PACK | Freq: Two times a day (BID) | ORAL | Status: DC
  Administered 2023-08-02 – 2023-08-08 (×10): 17 g via ORAL
  Filled 2023-08-02 (×12): qty 1

## 2023-08-02 NOTE — Progress Notes (Signed)
PROGRESS NOTE    Andrew Schmitt  HYQ:[id number] DOB: 1951-07-15 DOA: 07/21/2023 PCP: Dorothyann Peng, MD  [name] with chronic diastolic CHF, hypertension, dyslipidemia, ascending aortic aneurysm, BPH, CKD 3b/4, chronic anemia, gout, obesity presented to the ED with progressive weakness and dyspnea x7d. In the ED,  lethargic, hypoxic, bradycardic, placed on NRM   Labs-Na 135,bun 63, cr 3,18 , BNP 183, troponin 337, 278, 309 CXR wbilateral hilar vascular congestion, bilateral pleural effusions, moderate to large on Left. CT chest, A&P: -moderate left pleural effusion with compressive atelectasis, Mild diffuse body wall edema.  -12/6: started on diuretics, Coude catheter placed for hematuria w/ CLots and increased discomfort before overt retention -12/7 more encephalopathic, placed on BiPAP, very mild respiratory acidosis, pCO2 in the 60-70 range -Complicated by worsening AKI, nephrology consulting, diuretic dose increased 12/14 persistent elevation in BUN.  12/15 resumed diuretic therapy  12/26 improved volume status and transitioned to oral loop diuretic therapy.  12/27 holding diuretic therapy, continue to have severe BUN elevation, may need renal replacement therapy.   Subjective:  Assessment and Plan:  Acute on chronic diastolic CHF Left Pleural effusion Last echo 6/24 with EF 60-65%, moderate LVH, grade 2 DD, mildly reduced RV -Diuresed on IV Lasix, cards and nephrology following -Repeat echo with EF 65-70% moderate LVH, indeterminate diastolic parameters, normal RV -Creatinine slowly worsening, appreciate nephrology input, diuretics now held -GDMT limited by AKI/CKD -Pulmonary c/s:: L Thoracentesis 12/10: exudative on Thora, cytology negative  Acute hypoxemic hyercapnic respiratory failure, due to acute cardiogenic pulmonary edema, left pleural effusion, complicated with acute metabolic encephalopathy.  12/10 SP left thoracentesis 1100 ml drained  AKI on CKD stage 3b.  Hyponatremia,.  -cardiorenal,  baseline creatinine of 2-2.5  -Holding losartan -Monitor with diuresis -Foley catheter placed 12/6 for hematuria with clots, ? early retention, has known BPH, CT on admission with bilateral renal cysts as well -Creatinine was worsening, per nephrology, diuretics held now  Anemia of chronic renal disease stable  Second degree AV block, Mobitz type I Continue to hold on AV blockers.  BPH (benign prostatic hyperplasia) Continue with flomax and proscar  Foley catheter in place.   Obesity, class 2 Calculated BMI is 37,4  DVT prophylaxis: Hep SQ Code Status: Full Code Family Communication: Disposition Plan:   Consultants:    Procedures:   Antimicrobials:    Objective: Vitals:   08/02/23 0719 08/02/23 0747 08/02/23 1144 08/02/23 1620  BP:  131/66 109/64 130/69  Pulse:  73 78 79  Resp:  20 20 20   Temp:  97.6 F (36.4 C) 98.3 F (36.8 C) 98.3 F (36.8 C)  TempSrc:  Oral Oral Oral  SpO2:  100% 95% 92%  Weight: 100.5 kg     Height:        Intake/Output Summary (Last 24 hours) at 08/02/2023 1956 Last data filed at 08/02/2023 1900 Gross per 24 hour  Intake 2820 ml  Output 3000 ml  Net -180 ml   Filed Weights   07/31/23 0519 08/01/23 0434 08/02/23 0719  Weight: 103.3 kg 102.5 kg 100.5 kg    Examination:      Data Reviewed:   CBC: Recent Labs  Lab 07/27/23 0256 08/01/23 0256  WBC 5.5 7.5  HGB 8.3* 8.1*  HCT 26.8* 26.3*  MCV 94.4 96.0  PLT 96* 151   Basic Metabolic Panel: Recent Labs  Lab 07/28/23 0852 07/29/23 0249 07/30/23 0224 07/31/23 0308 08/01/23 0256 08/02/23 0257  NA 135 134* 133* 132* 132* 134*  K 4.1 4.2  4.4 4.3 4.3 4.6  CL 87* 86* 87* 84* 84* 82*  CO2 33* 35* 34* 36* 36* 37*  GLUCOSE 137* 139* 130* 138* 121* 124*  BUN 137* 149* 162* 176* 186* 191*  CREATININE 3.73* 3.82* 3.47* 3.33* 3.14* 3.42*  CALCIUM 9.1 9.2 9.3 9.4 9.7 10.3  MG 2.4  --   --   --   --   --   PHOS  --  5.0* 5.2* 4.6 3.2 4.1    GFR: Estimated Creatinine Clearance: 21 mL/min (A) (by C-G formula based on SCr of 3.42 mg/dL (H)). Liver Function Tests: Recent Labs  Lab 07/29/23 0249 07/30/23 0224 07/31/23 0308 08/01/23 0256 08/02/23 0257  ALBUMIN 3.1* 3.1* 3.1* 3.3* 3.5   No results for input(s): "LIPASE", "AMYLASE" in the last 168 hours. No results for input(s): "AMMONIA" in the last 168 hours. Coagulation Profile: No results for input(s): "INR", "PROTIME" in the last 168 hours. Cardiac Enzymes: No results for input(s): "CKTOTAL", "CKMB", "CKMBINDEX", "TROPONINI" in the last 168 hours. BNP (last 3 results) No results for input(s): "PROBNP" in the last 8760 hours. HbA1C: No results for input(s): "HGBA1C" in the last 72 hours. CBG: Recent Labs  Lab 07/29/23 2111  GLUCAP 145*   Lipid Profile: No results for input(s): "CHOL", "HDL", "LDLCALC", "TRIG", "CHOLHDL", "LDLDIRECT" in the last 72 hours. Thyroid Function Tests: No results for input(s): "TSH", "T4TOTAL", "FREET4", "T3FREE", "THYROIDAB" in the last 72 hours. Anemia Panel: No results for input(s): "VITAMINB12", "FOLATE", "FERRITIN", "TIBC", "IRON", "RETICCTPCT" in the last 72 hours. Urine analysis:    Component Value Date/Time   COLORURINE AMBER (A) 07/29/2023 1033   APPEARANCEUR CLOUDY (A) 07/29/2023 1033   LABSPEC 1.013 07/29/2023 1033   PHURINE 5.0 07/29/2023 1033   GLUCOSEU NEGATIVE 07/29/2023 1033   HGBUR LARGE (A) 07/29/2023 1033   BILIRUBINUR NEGATIVE 07/29/2023 1033   BILIRUBINUR negative 04/09/2021 0954   KETONESUR NEGATIVE 07/29/2023 1033   PROTEINUR 100 (A) 07/29/2023 1033   UROBILINOGEN 0.2 04/09/2021 0954   NITRITE NEGATIVE 07/29/2023 1033   LEUKOCYTESUR NEGATIVE 07/29/2023 1033   Sepsis Labs: @LABRCNTIP (procalcitonin:4,lacticidven:4)  ) Recent Results (from the past 240 hours)  Culture, blood (Routine X 2) w Reflex to ID Panel     Status: None   Collection Time: 07/23/23  9:12 PM   Specimen: BLOOD RIGHT HAND  Result  Value Ref Range Status   Specimen Description BLOOD RIGHT HAND  Final   Special Requests   Final    BOTTLES DRAWN AEROBIC AND ANAEROBIC Blood Culture results may not be optimal due to an excessive volume of blood received in culture bottles   Culture   Final    NO GROWTH 5 DAYS Performed at [facility], [address], [place], [place] [address]    Report Status 07/28/2023 FINAL  Final  Culture, blood (Routine X 2) w Reflex to ID Panel     Status: None   Collection Time: 07/23/23  9:12 PM   Specimen: BLOOD  Result Value Ref Range Status   Specimen Description BLOOD LEFT ANTECUBITAL  Final   Special Requests   Final    BOTTLES DRAWN AEROBIC AND ANAEROBIC Blood Culture adequate volume   Culture   Final    NO GROWTH 5 DAYS Performed at [facility], [address] [place], [place] [address]    Report Status 07/28/2023 FINAL  Final  Urine Culture (for pregnant, neutropenic or urologic patients or patients with an indwelling urinary catheter)  Status: None   Collection Time: 07/23/23  9:50 PM   Specimen: Urine, Catheterized  Result Value Ref Range Status   Specimen Description URINE, CATHETERIZED  Final   Special Requests NONE  Final   Culture   Final    NO GROWTH Performed at [facility], [address], [place], [place] [address]    Report Status 07/25/2023 FINAL  Final  Pleural fluid culture w Gram Stain     Status: None   Collection Time: 07/26/23  1:38 PM   Specimen: Pleural Fluid  Result Value Ref Range Status   Specimen Description PLEURAL  Final   Special Requests NONE  Final   Gram Stain   Final    FEW WBC PRESENT, PREDOMINANTLY MONONUCLEAR NO ORGANISMS SEEN    Culture   Final    NO GROWTH 3 DAYS Performed at [facility], [address], [place], [place] [address]    Report Status 07/29/2023 FINAL  Final     Radiology Studies: DG CHEST PORT 1 VIEW Result Date: 08/01/2023 CLINICAL DATA:  Pleural effusion. EXAM: PORTABLE CHEST 1  VIEW COMPARISON:  07/28/2023 FINDINGS: Right lung clear. Left base collapse/consolidation with small left pleural effusion, slightly decreased in the interval. The cardio pericardial silhouette is enlarged. No acute bony abnormality. Telemetry leads overlie the chest. IMPRESSION: Left base collapse/consolidation with small left pleural effusion, slightly decreased in the interval. Electronically Signed   By: Kennith Center M.D.   On: 08/01/2023 18:40     Scheduled Meds:  calcium acetate  667 mg Oral TID WC   Chlorhexidine Gluconate Cloth  6 each Topical Daily   feeding supplement  237 mL Oral BID BM   finasteride  5 mg Oral Daily   guaiFENesin  600 mg Oral BID   heparin injection (subcutaneous)  5,000 Units Subcutaneous Q8H   polyethylene glycol  17 g Oral BID   rosuvastatin  10 mg Oral Daily   tamsulosin  0.4 mg Oral Daily   Continuous Infusions:   LOS: 11 days    Time spent:    Zannie Cove, MD [facility]   08/02/2023, 7:56 PM

## 2023-08-02 NOTE — Progress Notes (Signed)
   08/02/23 2034  BiPAP/CPAP/SIPAP  BiPAP/CPAP/SIPAP Pt Type Adult  BiPAP/CPAP/SIPAP V60  BiPAP/CPAP /SiPAP Vitals  Pulse Rate 80  Resp 20  SpO2 96 %     Pt has PRN BIPAP orders, no distress noted

## 2023-08-02 NOTE — Progress Notes (Signed)
Mobility Specialist Progress Note:   08/02/23 1039  Mobility  Activity Ambulated with assistance in hallway  Level of Assistance Standby assist, set-up cues, supervision of patient - no hands on  Assistive Device Front wheel walker  Distance Ambulated (ft) 150 ft  Activity Response Tolerated well  Mobility Referral Yes  Mobility visit 1 Mobility  Mobility Specialist Start Time (ACUTE ONLY) 0915  Mobility Specialist Stop Time (ACUTE ONLY) [id number]  Mobility Specialist Time Calculation (min) (ACUTE ONLY) 24 min   Received pt in chair having no complaints and agreeable to mobility. Pt c/o mild SOB, SPO2 WFL. Returned to room w/o fault. Left in chair w/ call bell in reach and all needs met.   Thompson Grayer Mobility Specialist  Please contact vis Secure Chat or  Rehab Office [phone]

## 2023-08-02 NOTE — Progress Notes (Signed)
Patient ID: Andrew Schmitt, male   DOB: 1950/10/13, 72 y.o.   MRN: [id number] [facility] Progress Note   Assessment/ Plan:   1.  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   Continue to hold ARB and SGLT2 inhibitor for now.   With progressive azotemia and now inc SCr even with IV to PO diuretics, hold diuretics for now and see if we can get him moving in the right direction while maintaining near euvolemia If kidney function worsens or volume status then will need to start RRT Progressive alkalosis probably related to diuretics No clear uremia, only real indication for RRT is azotemia but everything else doing well -- no HD at this time  2.  Acute exacerbation of HFpEF Vol status improved, diuretics as above -- holding 3.  Encephalopathy: Seems stable  4.  Anemia: Secondary to chronic disease including chronic kidney disease.  IV iron was given 5.  Hypertension: Blood pressure stable.  HTN meds stopped 6. Pleural effusion: s/p thora predominantly lymphocytic. Some malignancy concern. First cytology negative for malignant cells  Subjective:    Feels well, walking in hall, no hypoxial, mild dyspnea SCr up to 3.4, BUN 191, UOP 3.3L on BID torsemide Rec AM torsemide already No sig N/V, dysgeusia Made 3.3L UOP net neg 3L? Weights down 2kg further Trace to 1+ LEE at most Patient declined to call / engage family in the conversation   Objective:   BP 131/66 (BP Location: Left Arm)   Pulse 73   Temp 97.6 F (36.4 C) (Oral)   Resp 20   Ht 5\' 6"  (1.676 m)   Wt 100.5 kg   SpO2 100%   BMI 35.75 kg/m   Intake/Output Summary (Last 24 hours) at 08/02/2023 1100 Last data filed at 08/02/2023 0946 Gross per 24 hour  Intake 720 ml  Output 3300 ml  Net -2580 ml   Weight change:   Physical Exam: Gen: Appears comfortable  sitting up in recliner, nad CVS: normal rate, no murmurs Resp: Bilateral chest rise with no increased work of breathing Abd: Soft, obese, nontender, bowel sounds normal Ext: trace bilateral lower extremity edema   Urine sediment exam on 12/13: 10-20 RBCs per high-powered field none with dysmorphic features, rare renal tubular epithelial cells, rare WBCs, no muddy brown casts, no cellular casts, 1 lipid-laden cast.  Imaging: DG CHEST PORT 1 VIEW Result Date: 08/01/2023 CLINICAL DATA:  Pleural effusion. EXAM: PORTABLE CHEST 1 VIEW COMPARISON:  07/28/2023 FINDINGS: Right lung clear. Left base collapse/consolidation with small left pleural effusion, slightly decreased in the interval. The cardio pericardial silhouette is enlarged. No acute bony abnormality. Telemetry leads overlie the chest. IMPRESSION: Left base collapse/consolidation with small left pleural effusion, slightly decreased in the interval. Electronically Signed   By: Kennith Center M.D.   On: 08/01/2023 18:40     Labs: BMET Recent Labs  Lab 07/27/23 0256 07/28/23 5284 07/29/23 0249 07/30/23 0224 07/31/23 0308 08/01/23 0256 08/02/23 0257  NA 133* 135 134* 133* 132* 132* 134*  K 4.2 4.1 4.2 4.4 4.3 4.3 4.6  CL 91* 87* 86* 87* 84* 84* 82*  CO2 31 33* 35* 34* 36* 36* 37*  GLUCOSE 120* 137* 139* 130* 138* 121* 124*  BUN 109* 137* 149* 162* 176* 186* 191*  CREATININE 3.77* 3.73* 3.82* 3.47* 3.33* 3.14* 3.42*  CALCIUM 8.7* 9.1 9.2 9.3 9.4 9.7  10.3  PHOS  --   --  5.0* 5.2* 4.6 3.2 4.1   CBC Recent Labs  Lab 07/27/23 0256 08/01/23 0256  WBC 5.5 7.5  HGB 8.3* 8.1*  HCT 26.8* 26.3*  MCV 94.4 96.0  PLT 96* 151    Medications:     calcium acetate  667 mg Oral TID WC   Chlorhexidine Gluconate Cloth  6 each Topical Daily   feeding supplement  237 mL Oral BID BM   finasteride  5 mg Oral Daily   guaiFENesin  600 mg Oral BID   heparin injection (subcutaneous)  5,000 Units Subcutaneous Q8H   polyethylene glycol  17 g  Oral Daily   rosuvastatin  10 mg Oral Daily   tamsulosin  0.4 mg Oral Daily   Arita Miss  08/02/2023, 11:00 AM

## 2023-08-02 NOTE — TOC Progression Note (Signed)
Transition of Care [facility]) - Progression Note    Patient Details  Name: Andrew Schmitt MRN: [id number] Date of Birth: 19-Sep-1950  Transition of [facility]) CM/SW Contact  Leone Haven, RN Phone Number: 08/02/2023, 3:06 PM  Clinical Narrative:    Cret up today, on torsemide.  TOC following.   Expected Discharge Plan: Home/Self Care Barriers to Discharge: Continued Medical Work up  Expected Discharge Plan and Services In-house Referral: NA Discharge Planning Services: CM Consult Post Acute Care Choice: NA Living arrangements for the past 2 months: Single Family Home                 DME Arranged: N/A DME Agency: NA       HH Arranged: NA           Social Determinants of Health (SDOH) Interventions SDOH Screenings   Food Insecurity: No Food Insecurity (07/22/2023)  Housing: Low Risk  (07/22/2023)  Transportation Needs: No Transportation Needs (07/22/2023)  Utilities: Not At Risk (07/22/2023)  Alcohol Screen: Low Risk  (07/07/2023)  Depression (PHQ2-9): Low Risk  (07/07/2023)  Financial Resource Strain: Low Risk  (07/07/2023)  Physical Activity: Insufficiently Active (07/07/2023)  Social Connections: Moderately Integrated (07/07/2023)  Stress: No Stress Concern Present (07/07/2023)  Tobacco Use: Low Risk  (07/26/2023)  Health Literacy: Adequate Health Literacy (07/07/2023)    Readmission Risk Interventions     No data to display

## 2023-08-02 NOTE — Progress Notes (Signed)
Progress Note   Patient: Andrew Schmitt VHQ:[id number] DOB: 02-11-1951 DOA: 07/21/2023     11 DOS: the patient was seen and examined on 08/02/2023   Brief hospital course: Mr. Didier was admitted to the hospital with the working diagnosis of acute heart failure decompensation.   [name] with chronic diastolic CHF, hypertension, dyslipidemia, ascending aortic aneurysm, BPH, CKD 3b/4, chronic anemia, gout, obesity presented to the ED with progressive weakness and shortness of breath for 7 days prior to admission. Worsening dyspnea on exertion, and lower extremity edema. Severe symptoms to the point where he was dyspneic with minimal efforts. EMS was called, he was found lethargic and bradycardic, he was placed on supplemental 02 per non re-breather mask and was transported to the ED.  On his initial ED physical examination his blood pressure was 118/66, HR 65, RR 22 and 02 saturation 100% on supplemental 02. Lungs with no wheezing or rhonchi, increased work of breathing, heart with S1 and S2 present and regular, with no gallops, or rubs, abdomen with no distention and positive lower extremity edema   Na 135, K 4.5 Cl 101, bicarbonate 25, glucose 128, bun 63, cr 3,18  Mg 2.9  BNP 183  High sensitive troponin 337, 278, 309 Wbc 4.3 hgb 10.4 plt 132  Urine analysis SG 1.009. protein 100, negative leukocytes, moderate hgb, RBC >50, wbc 6-10   Chest radiograph with cardiomegaly, bilateral hilar vascular congestion, bilateral pleural effusions, moderate to large on the left and small on the right.  CT chest, abdomen and pelvis with no aneurysm identified, moderate left pleural effusion and small right pleural effusion with compressive atelectasis on the left lower lobe and portion of the left upper lobe.  Mild bilateral perinephric stranding.  Mild diffuse body wall edema.  Bilateral renal cysts.  EKG 59 bpm, right axis deviation, qtc 510, sinus rhythm with first degree AV block, one non conducting P  wave, poor R R wave progression, with no significant ST segment or T wave changes.    -12/6: started on diuretics, Coude catheter placed for hematuria w/ CLots and increased discomfort before overt retention -12/7 more encephalopathic, placed on BiPAP, very mild respiratory acidosis, pCO2 in the 60-70 range -Complicated by worsening AKI, nephrology consulting, diuretic dose increased  12/13 improved volume status, but renal function not yet stable.  12/14 persistent elevation in BUN.  12/15 resumed diuretic therapy  12/26 improved volume status and transitioned to oral loop diuretic therapy.  12/27 holding diuretic therapy, continue to have severe BUM elevation, may need renal replacement therapy.   Assessment and Plan: * Acute on chronic diastolic CHF (congestive heart failure) (HCC) Echocardiogram with preserved LV systolic function with EF 65 to 70%, moderate LVH. RV systolic function preserved, LA with moderate dilatation, no significant valvular disease.   Urine output is 3,300 ml Systolic blood pressure range of 130 mmHg range.   Limited further guideline directed medical therapy due to low GFR.  12/11 metolazone 5 mg  Holding on diuretic therapy for now.  Clinically his edema has improved.   Acute hypoxemic hyercapnic respiratory failure, due to acute cardiogenic pulmonary edema, left pleural effusion, complicated with acute metabolic encephalopathy.  12/10 SP left thoracentesis 1100 ml removed.   Oxygenation is 95 % on room air.   Bipap at night and as needed during the day. Metabolic encephalopathy continue to improve.   Acute kidney injury superimposed on chronic kidney disease (HCC) CKD stage 3b. Hyponatremia,.   12/13 Urine analysis with SH 1.013, protein  100, large Hgb, negative leukocytes, 21-50 wbc and > 50 rbc.   Renal function today with serum cr at 3.42 with K at 4,6 and serum bicarbonate at 37  Na 134 and P 4.1, BUM continue to rise up to 191 today.  Volume  status has improved  Continue with renal diet and P binders Holding on diuretic therapy for now.   Patient continues with urinary catheter in place, plan for possible voiding trial prior to discharge, will keep it in for now to monitor urine output and response to diuretic therapy. Critical renal failure.    Anemia of chronic renal disease, with stable Hgb, no clinical sings of bleeding. Hgb is 8,1 and plt 151 with wbc at 7.5   Second degree AV block, Mobitz type I Continue to hold on AV blockers. Continue telemetry.  BPH (benign prostatic hyperplasia) Continue with flomax and proscar  Bladder catheter in place.   Obesity, class 2 Calculated BMI is 37,4        Subjective: Patient with no chest pain or dyspnea, continue very weak and deconditioned. Edema has been improving   Physical Exam: Vitals:   08/02/23 0418 08/02/23 0719 08/02/23 0747 08/02/23 1144  BP: 135/81  131/66 109/64  Pulse: 75  73 78  Resp: 18  20 20   Temp: 97.7 F (36.5 C)  97.6 F (36.4 C) 98.3 F (36.8 C)  TempSrc: Oral  Oral Oral  SpO2: 96%  100% 95%  Weight:  100.5 kg    Height:       Neurology awake and alert. Very mild somnolence but able to follow commands and answer questions ENT with mild pallor Cardiovascular with S1 and S2 present an regular with no gallops, rubs or murmurs Respiratory with no rales or wheezing, no rhonchi Abdomen with no distention  Positive trace lower extremity edema Foley catheter in place  Data Reviewed:    Family Communication: no family at the bedside   Disposition: Status is: Inpatient Remains inpatient appropriate because: monitor renal function   Planned Discharge Destination: Home     Author: Coralie Keens, MD 08/02/2023 2:06 PM  For on call review [email or web address].

## 2023-08-03 ENCOUNTER — Inpatient Hospital Stay (HOSPITAL_COMMUNITY): Payer: Medicare PPO

## 2023-08-03 DIAGNOSIS — I5033 Acute on chronic diastolic (congestive) heart failure: Secondary | ICD-10-CM | POA: Diagnosis not present

## 2023-08-03 LAB — BASIC METABOLIC PANEL
Anion gap: 14 (ref 5–15)
BUN: 199 mg/dL — ABNORMAL HIGH (ref 8–23)
CO2: 37 mmol/L — ABNORMAL HIGH (ref 22–32)
Calcium: 10.1 mg/dL (ref 8.9–10.3)
Chloride: 81 mmol/L — ABNORMAL LOW (ref 98–111)
Creatinine, Ser: 3.78 mg/dL — ABNORMAL HIGH (ref 0.61–1.24)
GFR, Estimated: 16 mL/min — ABNORMAL LOW (ref >60)
Glucose, Bld: 108 mg/dL — ABNORMAL HIGH (ref 70–99)
Potassium: 4.3 mmol/L (ref 3.5–5.1)
Sodium: 132 mmol/L — ABNORMAL LOW (ref 135–145)

## 2023-08-03 LAB — CBC
HCT: 24.5 % — ABNORMAL LOW (ref 39.0–52.0)
Hemoglobin: 7.7 g/dL — ABNORMAL LOW (ref 13.0–17.0)
MCH: 29.5 pg (ref 26.0–34.0)
MCHC: 31.4 g/dL (ref 30.0–36.0)
MCV: 93.9 fL (ref 80.0–100.0)
Platelets: 181 10*3/uL (ref 150–400)
RBC: 2.61 MIL/uL — ABNORMAL LOW (ref 4.22–5.81)
RDW: 15.5 % (ref 11.5–15.5)
WBC: 7.9 10*3/uL (ref 4.0–10.5)
nRBC: 0 % (ref 0.0–0.2)

## 2023-08-03 LAB — MAGNESIUM: Magnesium: 2.6 mg/dL — ABNORMAL HIGH (ref 1.7–2.4)

## 2023-08-03 MED ORDER — SODIUM CHLORIDE 0.9 % IR SOLN
3000.0000 mL | Status: DC
Start: 2023-08-03 — End: 2023-08-17
  Administered 2023-08-03 – 2023-08-14 (×6): 3000 mL

## 2023-08-03 NOTE — Consult Note (Signed)
Urology Consult Note   Requesting Attending Physician:  Zannie Cove, MD Service Providing Consult: Urology  Consulting Attending: Dr. Sherron Monday   Reason for Consult:  hematuria, clot obstruction of urine  HPI: Andrew Schmitt is seen in consultation for reasons noted above at the request of Zannie Cove, MD. patient initially presented to [facility] emergency department with generalized weakness and shortness of breath.  PMH significant for HFpEF, HTN, HLD, ascending aortic aneurysm, BPH, CKD stage IIIb, CAD D, chronic anemia, thrombocytopenia, gout, and obesity.  [facility] was consulted to address hematuria with presumed clot obstruction of urine.  Patient was reported to have had some hematuria around his admission that resolved quickly without intervention.  On arrival patient was alert, oriented, and in clear discomfort.  The nurse displayed a bag that had an estimated 800 cc of consolidated clot material in there and no urine was observed.  Foley catheter was clearly displaced and likely in the bulbar urethra.  Patient was passing clot material around his Foley and was complaining of severe urinary pressure.  Please see separate procedure note  ------------------  Assessment:  72 y.o. male with BPH, traumatic Foley removal, anticoagulation, uremic bleeding   Recommendations: # Hematuria # Traumatic Foley removal # Uremic bleeding # Clot obstruction of urine  Over wire placement of council hematuria catheter fashioned from a 66f Rusch.   Only around 50cc of clot material hand irrigated.  Catheter is somewhat positional and nearly hubbed despite a calculated prostate size around 60 to 75g.  CT hematuria to rule out large consolidated clot.  Patient cannot tolerate contrast but should be able to make an assessment of his clot burden without it.  Continue CBI on medium gtt.  I reviewed the case and plan with his bedside nurse who was assisting the procedure.  Nursing to hand irrigate as needed.    Stat CBC, trend H&H.  Transfuse for Hgb less than 8.0  His prophylactic Q8 heparin has been discontinued. BUN 199.  Will hopefully be self-limiting once uremia has improved.  We will follow along with you.  Case and plan discussed with Dr. Sherron Monday  Past Medical History: Past Medical History:  Diagnosis Date   Ascending aortic aneurysm (HCC) 03/19/2022   BPH (benign prostatic hyperplasia)    CKD (chronic kidney disease) stage 3, GFR 30-59 ml/min (HCC) 04/05/2019   CKD (chronic kidney disease), stage IV (HCC) 04/05/2019   Coronary artery calcification 03/19/2022   Erectile dysfunction    Gout    Gout    Heart murmur    Hyperlipidemia    Hypertension    Obesity     Past Surgical History:  Past Surgical History:  Procedure Laterality Date   CARDIAC SURGERY     THORACENTESIS Left 07/26/2023   Procedure: THORACENTESIS;  Surgeon: Karren Burly, MD;  Location: [facility] ENDOSCOPY;  Service: Pulmonary;  Laterality: Left;    Medication: Current Facility-Administered Medications  Medication Dose Route Frequency Provider Last Rate Last Admin   acetaminophen (TYLENOL) tablet 650 mg  650 mg Oral Q6H PRN John Giovanni, MD   650 mg at 07/27/23 0133   Or   acetaminophen (TYLENOL) suppository 650 mg  650 mg Rectal Q6H PRN John Giovanni, MD       calcium acetate (PHOSLO) capsule 667 mg  667 mg Oral TID WC Coralie Keens, MD   667 mg at 08/03/23 6213   Chlorhexidine Gluconate Cloth 2 % PADS 6 each  6 each Topical Daily Zannie Cove,  MD   6 each at 08/03/23 0908   feeding supplement (ENSURE ENLIVE / ENSURE PLUS) liquid 237 mL  237 mL Oral BID BM Zannie Cove, MD   237 mL at 08/03/23 0908   finasteride (PROSCAR) tablet 5 mg  5 mg Oral Daily John Giovanni, MD   5 mg at 08/03/23 0907   guaiFENesin (MUCINEX) 12 hr tablet 600 mg  600 mg Oral BID Zannie Cove, MD   600 mg at 08/03/23 9528   polyethylene glycol (MIRALAX /  GLYCOLAX) packet 17 g  17 g Oral BID Coralie Keens, MD   17 g at 08/03/23 4132   rosuvastatin (CRESTOR) tablet 10 mg  10 mg Oral Daily Sundil, Subrina, MD   10 mg at 08/03/23 4401   sodium chloride (OCEAN) 0.65 % nasal spray 1 spray  1 spray Each Nare PRN Arrien, York Ram, MD       sodium chloride irrigation 0.9 % 3,000 mL  3,000 mL Irrigation Continuous Wyline Mood, NP       tamsulosin (FLOMAX) capsule 0.4 mg  0.4 mg Oral Daily John Giovanni, MD   0.4 mg at 08/03/23 0908    Allergies: Allergies  Allergen Reactions   Ace Inhibitors Cough    Social History: Social History   Tobacco Use   Smoking status: Never   Smokeless tobacco: Never  Vaping Use   Vaping status: Never Used  Substance Use Topics   Alcohol use: No   Drug use: No    Family History Family History  Problem Relation Age of Onset   Healthy Mother    Healthy Father     Review of Systems  Genitourinary:  Positive for hematuria.     Objective   Vital signs in last 24 hours: BP 118/75 (BP Location: Left Arm)   Pulse 86   Temp 98.4 F (36.9 C) (Oral)   Resp 17   Ht 5\' 6"  (1.676 m)   Wt 101.2 kg   SpO2 96%   BMI 36.01 kg/m   Physical Exam General: NAD, A&O, distressed HEENT: /AT Pulmonary: Normal work of breathing Cardiovascular: RRR, no cyanosis Abdomen: Soft, NTTP, nondistended GU: 3-way council hematuria catheter in place. CBI on med gtt. Irrigant pink lemonade.  Neuro: Appropriate, no focal neurological deficits  Most Recent Labs: Lab Results  Component Value Date   WBC 7.5 08/01/2023   HGB 8.1 (L) 08/01/2023   HCT 26.3 (L) 08/01/2023   PLT 151 08/01/2023    Lab Results  Component Value Date   NA 132 (L) 08/03/2023   K 4.3 08/03/2023   CL 81 (L) 08/03/2023   CO2 37 (H) 08/03/2023   BUN 199 (H) 08/03/2023   CREATININE 3.78 (H) 08/03/2023   CALCIUM 10.1 08/03/2023   MG 2.6 (H) 08/03/2023   PHOS 4.1 08/02/2023    No results found for: "INR",  "APTT"   Urine Culture: @[id number] (laburin,org,r9620,r9621)@   IMAGING: DG CHEST PORT 1 VIEW Result Date: 08/01/2023 CLINICAL DATA:  Pleural effusion. EXAM: PORTABLE CHEST 1 VIEW COMPARISON:  07/28/2023 FINDINGS: Right lung clear. Left base collapse/consolidation with small left pleural effusion, slightly decreased in the interval. The cardio pericardial silhouette is enlarged. No acute bony abnormality. Telemetry leads overlie the chest. IMPRESSION: Left base collapse/consolidation with small left pleural effusion, slightly decreased in the interval. Electronically Signed   By: Kennith Center M.D.   On: 08/01/2023 18:40    ------  Elmon Kirschner, NP Pager: [phone]   Please contact the urology consult  pager with any further questions/concerns.

## 2023-08-03 NOTE — Progress Notes (Signed)
Patient found on [facility] by CNA, he had gotten up himself. He says no one came to help him up when he called. Foley was twisted around his leg and blood was in the tube. Dr. Jomarie Longs made aware and urology consulted after I was unable to manually irrigate the foley. Large blood clots were in the foley bag. Urology came to bedside to replace 3 way foley and initiate CBI. Patient instructed to not get up by himself anymore so that this does not happen again. Will continue to monitor and run CBI.

## 2023-08-03 NOTE — Progress Notes (Signed)
Physical Therapy Treatment Patient Details Name: Andrew Schmitt MRN: [id number] DOB: Mar 22, 1951 Today's Date: 08/03/2023   History of Present Illness Patient is a 72 year old with acute on chronic diastolic CHF, Acute hypoxemic hyercapnic respiratory failure, acute cardiogenic pulmonary edema, left pleural effusion, metabolic encephalopathy. S/p left thoracentesis.    PT Comments  Patient resting on BSC at start of session and requesting assist for pericare and assist off BSC. Pt required CGA for safety to stand and reliant on UE support for static standing balance; max assist for pericare required and cues to initiate turn to sit in recliner for rest as pt fatigued quickly. RN arrived to complete foley care as pt having leaking and notable sanguineous output. Pt initially declining to transfer chair>bed for care but with education on ease of care pt agreeable to stand and pivot to return to bed. CGA provided for safety and min-mod assist required to raise LE's and control lowering trunk back to supine. Overall throughout session pt demonstrated decreased awareness of situation and deficits and slow processing for tasks. Recommend pt have assist from family during functional mobility at this time. Will continue to progress pt as able and update recs as needed.    If plan is discharge home, recommend the following: A little help with walking and/or transfers;A little help with bathing/dressing/bathroom;Assist for transportation;Help with stairs or ramp for entrance;Assistance with cooking/housework   Can travel by private vehicle        Equipment Recommendations  Hospital bed;BSC/3in1 (pending progress/tbd)    Recommendations for Other Services       Precautions / Restrictions Precautions Precautions: Fall Restrictions Weight Bearing Restrictions Per Provider Order: No     Mobility  Bed Mobility Overal bed mobility: Needs Assistance Bed Mobility: Sit to Supine       Sit to  supine: Mod assist, HOB elevated, Used rails, Min assist   General bed mobility comments: Patient required mod assist to raise bil LE's onto bed and min assist to lower trunk.    Transfers Overall transfer level: Needs assistance Equipment used: None, 1 person hand held assist Transfers: Sit to/from Stand, Bed to chair/wheelchair/BSC Sit to Stand: Contact guard assist   Step pivot transfers: Contact guard assist       General transfer comment: pt declined RW for sit<>stand and BSC>chair transfer. close CGA provided and pt reaching UE's to armrest of chair for support, pt able to turn and sequence steps laterally to turn and sit in recliner. CGA for step pivot transfer chair>EOB.    Ambulation/Gait                   Stairs             Wheelchair Mobility     Tilt Bed    Modified Rankin (Stroke Patients Only)       Balance Overall balance assessment: Needs assistance Sitting-balance support: Single extremity supported, No upper extremity supported, Feet supported Sitting balance-Leahy Scale: Good     Standing balance support: Bilateral upper extremity supported, During functional activity, Reliant on assistive device for balance Standing balance-Leahy Scale: Poor                              Cognition Arousal: Alert Behavior During Therapy: WFL for tasks assessed/performed, Flat affect Overall Cognitive Status: No family/caregiver present to determine baseline cognitive functioning  General Comments: pt with decreased insight to defictis and safety, pt also with decreased understanding of situation and medical care provided. slow to process and initiate mobility.        Exercises      General Comments        Pertinent Vitals/Pain Pain Assessment Pain Assessment: No/denies pain    Home Living                          Prior Function            PT Goals (current goals  can now be found in the care plan section) Acute Rehab PT Goals Patient Stated Goal: family goal to return home with family support and HHPT PT Goal Formulation: With patient Time For Goal Achievement: 08/12/23 Potential to Achieve Goals: Good Progress towards PT goals: Progressing toward goals    Frequency    Min 1X/week      PT Plan      Co-evaluation              AM-PAC PT "6 Clicks" Mobility   Outcome Measure  Help needed turning from your back to your side while in a flat bed without using bedrails?: A Little Help needed moving from lying on your back to sitting on the side of a flat bed without using bedrails?: A Little Help needed moving to and from a bed to a chair (including a wheelchair)?: A Little Help needed standing up from a chair using your arms (e.g., wheelchair or bedside chair)?: A Little Help needed to walk in hospital room?: A Little Help needed climbing 3-5 steps with a railing? : A Lot 6 Click Score: 17    End of Session Equipment Utilized During Treatment: Oxygen Activity Tolerance: Patient tolerated treatment well Patient left: in bed;with nursing/sitter in room Nurse Communication: Mobility status PT Visit Diagnosis: Unsteadiness on feet (R26.81);Muscle weakness (generalized) (M62.81)     Time: 8295-6213 PT Time Calculation (min) (ACUTE ONLY): 18 min  Charges:    $Therapeutic Activity: 8-22 mins PT General Charges $$ ACUTE PT VISIT: 1 Visit                     Wynn Maudlin, DPT Acute Rehabilitation Services Office [phone]  08/03/23 1:50 PM

## 2023-08-03 NOTE — Progress Notes (Signed)
EKG completed. MD made aware.

## 2023-08-03 NOTE — Progress Notes (Signed)
Patient ID: Andrew Schmitt, male   DOB: Jul 31, 1951, 72 y.o.   MRN: [id number]  [facility] Progress Note   Assessment/ Plan:   1.  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   Continue to hold ARB and SGLT2 inhibitor for now.   With progressive azotemia and now inc SCr even with IV to PO diuretics, holding diuretics for now and see if we can get him moving in the right direction while maintaining near euvolemia;  Though sever azotemia no other clear indicators to start RRT If kidney function worsens or volume status then will need to start RRT; reasses tomorrow in AM; patient wants to be 'patient' and wait this out if possible; dtr Fanta in agreement. Progressive alkalosis probably related to diuretics  2.  Acute exacerbation of HFpEF Vol status improved, diuretics as above -- holding 3.  Encephalopathy: Seems stable  4.  Anemia: Secondary to chronic disease including chronic kidney disease.  IV iron was given; CTM 5.  Hypertension: Blood pressure stable.  HTN meds stopped 6. Pleural effusion: s/p thora predominantly lymphocytic. Some malignancy concern. First cytology negative for malignant cells  Subjective:    Feels well, eating ok, no c/o BUN up to 199 and SCr 3.78, K 4.3, HCO3 37 Off all diuretics, last dose of torsemide yesterday AM UOP of 2.7L yesterda, already 1.4L this AM No sig N/V, dysgeusia Weights stable Trace LEE, no sig SOB Dtr Fanta joined [place] by phone, I also spoke with her yesterday in PM    Objective:   BP 118/75 (BP Location: Left Arm)   Pulse 86   Temp 98.4 F (36.9 C) (Oral)   Resp 17   Ht 5\' 6"  (1.676 m)   Wt 101.2 kg   SpO2 96%   BMI 36.01 kg/m   Intake/Output Summary (Last 24 hours) at 08/03/2023 1006 Last data filed at 08/03/2023 8295 Gross per 24 hour  Intake 2040 ml  Output 4100 ml   Net -2060 ml   Weight change:   Physical Exam: Gen: Appears comfortable sitting up in recliner, nad CVS: normal rate, no murmurs Resp: Bilateral chest rise with no increased work of breathing; diminished throughout Abd: Soft, obese, nontender, bowel sounds normal Ext: trace bilateral lower extremity edema   Urine sediment exam on 12/13: 10-20 RBCs per high-powered field none with dysmorphic features, rare renal tubular epithelial cells, rare WBCs, no muddy brown casts, no cellular casts, 1 lipid-laden cast.  Imaging: DG CHEST PORT 1 VIEW Result Date: 08/01/2023 CLINICAL DATA:  Pleural effusion. EXAM: PORTABLE CHEST 1 VIEW COMPARISON:  07/28/2023 FINDINGS: Right lung clear. Left base collapse/consolidation with small left pleural effusion, slightly decreased in the interval. The cardio pericardial silhouette is enlarged. No acute bony abnormality. Telemetry leads overlie the chest. IMPRESSION: Left base collapse/consolidation with small left pleural effusion, slightly decreased in the interval. Electronically Signed   By: Kennith Center M.D.   On: 08/01/2023 18:40     Labs: [employer] [employer] Recent Labs  Lab 07/28/23 6213 07/29/23 0249 07/30/23 0224 07/31/23 0308 08/01/23 0256 08/02/23 0257 08/03/23 0738  NA 135 134* 133* 132* 132* 134* 132*  K 4.1 4.2 4.4 4.3 4.3 4.6 4.3  CL 87* 86* 87* 84* 84* 82* 81*  CO2 33* 35* 34* 36* 36* 37* 37*  GLUCOSE 137* 139* 130* 138* 121* 124* 108*  BUN 137* 149* 162* 176* 186* 191*  199*  CREATININE 3.73* 3.82* 3.47* 3.33* 3.14* 3.42* 3.78*  CALCIUM 9.1 9.2 9.3 9.4 9.7 10.3 10.1  PHOS  --  5.0* 5.2* 4.6 3.2 4.1  --    CBC Recent Labs  Lab 08/01/23 0256  WBC 7.5  HGB 8.1*  HCT 26.3*  MCV 96.0  PLT 151    Medications:     calcium acetate  667 mg Oral TID WC   Chlorhexidine Gluconate Cloth  6 each Topical Daily   feeding supplement  237 mL Oral BID BM   finasteride  5 mg Oral Daily   guaiFENesin  600 mg Oral BID   heparin injection  (subcutaneous)  5,000 Units Subcutaneous Q8H   polyethylene glycol  17 g Oral BID   rosuvastatin  10 mg Oral Daily   tamsulosin  0.4 mg Oral Daily   Arita Miss  08/03/2023, 10:06 AM

## 2023-08-03 NOTE — Procedures (Signed)
   Urology Procedure Note:   Patient is a 71 year old male with traumatic removal of Foley catheter in the context of urinary bleeding and DVT prophylactic anticoagulation.  Please see separate consult note.  After briefly reviewing the situation and collecting the patient history the shared decision was made to proceed with placement of hematuria catheter, hand irrigation, and initiation of CBI.  Patient was prepped and draped in the usual sterile fashion.  10 cc of lidocaine infused lubricant was injected directly into the urethra.  After adequate time for anesthetic effect been provided a 28f rusch hematuria catheter was attempted.  Was unable to advance past the bulbar urethra.  I opted to remove the catheter and take the end off of it fashion to get into a council tip.  I then was able to guide a sensor wire to the level of the bladder.  The council tip hematuria catheter was generously lubricated and gently fed over the wire, this time successfully advancing into the bladder without resistance.  There is immediate return of bloody urine.  The balloon was then inflated with 10 cc of water and around 50 cc of clot material was hand irrigated.  The catheter was somewhat positional and needed to be essentially hubbed in order to have good flow.  At this point CBI was initiated at a medium gtt. and Foley was placed to gravity drainage without dependent loops.  This concluded the procedure.  I monitored the CBI for approximately 10 minutes to ensure there was no malfunction before leaving the room.  Do not remove the catheter without consulting urology.    Elmon Kirschner, NP [facility] Pager: [phone]

## 2023-08-03 NOTE — Progress Notes (Addendum)
Nonsustained V. Tach: Patient has nonsustained 24 runs of V. tach which converted to sinus rhythm.  Patient is hemodynamically stable.  Per chart review patient has history of second-degree AV block Mobitz type I. -Checking stat BMP and mag level to make sure electrolytes are within normal range.  EKG showing second-degree AV block, Mobitz type I. - If patient continues sustained V. tach about 30 seconds in that case we will give amiodarone bolus and informed/consult cardiology.  If patient continues to have progression of the AV block versus develop sustained V. tach need to consult cardiology for evaluation.  Tereasa Coop, MD [facility] 08/03/2023, 5:59 AM

## 2023-08-03 NOTE — Progress Notes (Signed)
Pt had 24 beat run Vtach, asymptomatic. VS stable. Janalyn Shy, MD notified.  New lab orders placed.

## 2023-08-04 ENCOUNTER — Inpatient Hospital Stay (HOSPITAL_COMMUNITY): Payer: Medicare PPO

## 2023-08-04 ENCOUNTER — Encounter (HOSPITAL_COMMUNITY): Payer: Self-pay | Admitting: Internal Medicine

## 2023-08-04 DIAGNOSIS — I5033 Acute on chronic diastolic (congestive) heart failure: Secondary | ICD-10-CM | POA: Diagnosis not present

## 2023-08-04 HISTORY — PX: IR FLUORO GUIDE CV LINE RIGHT: IMG2283

## 2023-08-04 HISTORY — PX: IR US GUIDE VASC ACCESS RIGHT: IMG2390

## 2023-08-04 LAB — CBC
HCT: 23.7 % — ABNORMAL LOW (ref 39.0–52.0)
Hemoglobin: 7.4 g/dL — ABNORMAL LOW (ref 13.0–17.0)
MCH: 29.6 pg (ref 26.0–34.0)
MCHC: 31.2 g/dL (ref 30.0–36.0)
MCV: 94.8 fL (ref 80.0–100.0)
Platelets: 176 10*3/uL (ref 150–400)
RBC: 2.5 MIL/uL — ABNORMAL LOW (ref 4.22–5.81)
RDW: 15.7 % — ABNORMAL HIGH (ref 11.5–15.5)
WBC: 7.4 10*3/uL (ref 4.0–10.5)
nRBC: 0 % (ref 0.0–0.2)

## 2023-08-04 LAB — BASIC METABOLIC PANEL
Anion gap: 16 — ABNORMAL HIGH (ref 5–15)
BUN: 208 mg/dL — ABNORMAL HIGH (ref 8–23)
CO2: 35 mmol/L — ABNORMAL HIGH (ref 22–32)
Calcium: 10 mg/dL (ref 8.9–10.3)
Chloride: 81 mmol/L — ABNORMAL LOW (ref 98–111)
Creatinine, Ser: 4.4 mg/dL — ABNORMAL HIGH (ref 0.61–1.24)
GFR, Estimated: 13 mL/min — ABNORMAL LOW (ref >60)
Glucose, Bld: 114 mg/dL — ABNORMAL HIGH (ref 70–99)
Potassium: 5 mmol/L (ref 3.5–5.1)
Sodium: 132 mmol/L — ABNORMAL LOW (ref 135–145)

## 2023-08-04 LAB — HEPATITIS B SURFACE ANTIGEN: Hepatitis B Surface Ag: NONREACTIVE

## 2023-08-04 MED ORDER — SODIUM CHLORIDE 0.9 % IV SOLN
20.0000 ug | INTRAVENOUS | Status: AC
  Administered 2023-08-04: 20 ug via INTRAVENOUS
  Filled 2023-08-04: qty 5

## 2023-08-04 MED ORDER — DARBEPOETIN ALFA 60 MCG/0.3ML IJ SOSY
60.0000 ug | PREFILLED_SYRINGE | INTRAMUSCULAR | Status: DC
  Administered 2023-08-04: 60 ug via SUBCUTANEOUS
  Filled 2023-08-04: qty 0.3

## 2023-08-04 MED ORDER — CHLORHEXIDINE GLUCONATE CLOTH 2 % EX PADS
6.0000 | MEDICATED_PAD | Freq: Every day | CUTANEOUS | Status: DC
  Administered 2023-08-04: 6 via TOPICAL

## 2023-08-04 MED ORDER — HEPARIN SODIUM (PORCINE) 1000 UNIT/ML IJ SOLN
INTRAMUSCULAR | Status: AC
  Filled 2023-08-04: qty 10

## 2023-08-04 MED ORDER — LIDOCAINE-EPINEPHRINE 1 %-1:100000 IJ SOLN
INTRAMUSCULAR | Status: AC
  Filled 2023-08-04: qty 1

## 2023-08-04 NOTE — Consult Note (Signed)
Chief Complaint: Acute renal failure  Referring Provider(s): Sabra Heck  Supervising Physician: Marliss Coots  Patient Status: [facility] - In-pt  History of Present Illness: Andrew Schmitt is a 72 y.o. male with medical issues including HFpEF, HTN, HLD, ascending aortic aneurysm, BPH, CKD stage IIIb, CAD D, chronic anemia, thrombocytopenia, gout, and obesity.   He presented to [facility] on 07/21/23 with generalized weakness and shortness of breath.    He was found to have CHF with volume overload and was admitted for diuresis and treatment.  On admission creatinine was 3.18 with BUN 63.  His renal function has continued to worsen.  Creatinine today is 4.4 with BUN 208.  We are asked to place a dialysis catheter so hemodialysis can be initiated today.  Given his uremia, he will be more coagulopathic and DDAVP has been ordered to be given prior to procedure.  Patient is Full Code  Past Medical History:  Diagnosis Date   Ascending aortic aneurysm (HCC) 03/19/2022   BPH (benign prostatic hyperplasia)    CKD (chronic kidney disease) stage 3, GFR 30-59 ml/min (HCC) 04/05/2019   CKD (chronic kidney disease), stage IV (HCC) 04/05/2019   Coronary artery calcification 03/19/2022   Erectile dysfunction    Gout    Gout    Heart murmur    Hyperlipidemia    Hypertension    Obesity     Past Surgical History:  Procedure Laterality Date   CARDIAC SURGERY     THORACENTESIS Left 07/26/2023   Procedure: THORACENTESIS;  Surgeon: Karren Burly, MD;  Location: [facility] ENDOSCOPY;  Service: Pulmonary;  Laterality: Left;    Allergies: Ace inhibitors  Medications: Prior to Admission medications   Medication Sig Start Date End Date Taking? Authorizing Provider  acetaminophen (TYLENOL) 500 MG tablet Take 1,000 mg by mouth daily.   Yes [provider]  allopurinol (ZYLOPRIM) 300 MG tablet TAKE 1 TABLET BY MOUTH EVERY DAY 07/05/23  Yes Dorothyann Peng, MD  amLODipine  (NORVASC) 10 MG tablet TAKE 1 TABLET (10 MG TOTAL) BY MOUTH DAILY. PLEASE CALL OFFICE TO SCHEDULE APPT. 07/01/23  Yes Dorothyann Peng, MD  aspirin EC 81 MG tablet Take 81 mg by mouth daily.   Yes [provider]  carvedilol (COREG) 6.25 MG tablet TAKE 1 TABLET BY MOUTH TWICE A DAY WITH FOOD 06/01/23  Yes Dorothyann Peng, MD  chlorthalidone (HYGROTON) 50 MG tablet Take 50 mg by mouth daily.   Yes [provider]  docusate sodium (COLACE) 50 MG capsule Take 400 mg by mouth daily.   Yes [provider]  finasteride (PROSCAR) 5 MG tablet TAKE 1 TABLET BY MOUTH EVERY DAY 12/17/22  Yes Dorothyann Peng, MD  furosemide (LASIX) 20 MG tablet TAKE 1 TABLET BY MOUTH EVERY DAY 06/21/23  Yes Dorothyann Peng, MD  Homeopathic Products [facility] DRY EYE RELIEF) SOLN Apply 2 drops to eye as needed (Dry eyes).   Yes [provider]  hydrALAZINE (APRESOLINE) 100 MG tablet Take 1 tablet (100 mg total) by mouth 3 (three) times daily. Patient taking differently: Take 100 mg by mouth 2 (two) times daily. 07/08/23  Yes Dorothyann Peng, MD  Iron-Vitamins (S.S.S. TONIC PO) Take 20 mLs by mouth daily.   Yes [provider]  JARDIANCE 25 MG TABS tablet Take 25 mg by mouth every morning. 03/05/22  Yes [provider]  losartan (COZAAR) 100 MG tablet Take 100 mg by mouth at bedtime. 07/06/23  Yes [provider]  rosuvastatin (  CRESTOR) 20 MG tablet Take 1 tablet (20 mg total) by mouth daily. 04/27/22  Yes Chilton Si, MD  sodium chloride (OCEAN) 0.65 % SOLN nasal spray Place 1 spray into both nostrils 3 (three) times a week.   Yes [provider]  tamsulosin (FLOMAX) 0.4 MG CAPS capsule TAKE 1 CAPSLE BY MOUTH DAILY 09/07/22  Yes McKenzie, Mardene Celeste, MD     Family History  Problem Relation Age of Onset   Healthy Mother    Healthy Father     Social History   Socioeconomic History   Marital status: Married    Spouse name: Not on file   Number of  children: 4   Years of education: Not on file   Highest education level: Not on file  Occupational History   Occupation: retired  Tobacco Use   Smoking status: Never   Smokeless tobacco: Never  Vaping Use   Vaping status: Never Used  Substance and Sexual Activity   Alcohol use: No   Drug use: No   Sexual activity: Yes  Other Topics Concern   Not on file  Social History Narrative   Not on file   Social Drivers of Health   Financial Resource Strain: Low Risk  (07/07/2023)   Overall Financial Resource Strain (CARDIA)    Difficulty of Paying Living Expenses: Not hard at all  Food Insecurity: No Food Insecurity (07/22/2023)   Hunger Vital Sign    Worried About Running Out of Food in the Last Year: Never true    Ran Out of Food in the Last Year: Never true  Transportation Needs: No Transportation Needs (07/22/2023)   PRAPARE - Administrator, Civil Service (Medical): No    Lack of Transportation (Non-Medical): No  Physical Activity: Insufficiently Active (07/07/2023)   Exercise Vital Sign    Days of Exercise per Week: 2 days    Minutes of Exercise per Session: 30 min  Stress: No Stress Concern Present (07/07/2023)   [employer] of Occupational Health - Occupational Stress Questionnaire    Feeling of Stress : Not at all  Social Connections: Moderately Integrated (07/07/2023)   Social Connection and Isolation Panel [NHANES]    Frequency of Communication with Friends and Family: More than three times a week    Frequency of Social Gatherings with Friends and Family: Three times a week    Attends Religious Services: More than 4 times per year    Active Member of Clubs or Organizations: No    Attends Banker Meetings: Never    Marital Status: Married     Review of Systems: unable to review due to mental status.   Vital Signs: BP 109/62 (BP Location: Left Arm)   Pulse 73   Temp 98 F (36.7 C) (Oral)   Resp 17   Ht 5\' 6"  (1.676 m)   Wt 227 lb  11.8 oz (103.3 kg)   SpO2 100%   BMI 36.76 kg/m   Advance Care Plan: The advanced care place/surrogate decision maker was discussed at the time of visit and the patient did not wish to discuss or was not able to name a surrogate decision maker or provide an advance care plan.  Physical Exam Constitutional:      Appearance: Normal appearance.  HENT:     Head: Normocephalic and atraumatic.  Cardiovascular:     Rate and Rhythm: Normal rate and regular rhythm.  Pulmonary:     Effort: Pulmonary effort is normal. No respiratory distress.  Abdominal:     Palpations: Abdomen is soft.     Tenderness: There is no abdominal tenderness.  Skin:    General: Skin is warm and dry.  Neurological:     General: No focal deficit present.     Mental Status: He is alert.     Imaging: CT ABDOMEN PELVIS WO CONTRAST Result Date: 08/03/2023 CLINICAL DATA:  Hematuria. EXAM: CT ABDOMEN AND PELVIS WITHOUT CONTRAST TECHNIQUE: Multidetector CT imaging of the abdomen and pelvis was performed following the standard protocol without IV contrast. RADIATION DOSE REDUCTION: This exam was performed according to the departmental dose-optimization program which includes automated exposure control, adjustment of the mA and/or kV according to patient size and/or use of iterative reconstruction technique. COMPARISON:  CT of the chest abdomen pelvis dated 07/21/2023. FINDINGS: Evaluation of this exam is limited in the absence of intravenous contrast. Lower chest: Small bilateral pleural effusions and bibasilar atelectasis. No intra-abdominal free air or free fluid. Hepatobiliary: The liver is unremarkable. No biliary dilatation. The gallbladder is unremarkable Pancreas: The pancreas is unremarkable as visualized. Spleen: Normal in size without focal abnormality. Adrenals/Urinary Tract: The adrenal glands are unremarkable. There is no hydronephrosis or nephrolithiasis on either side. Bilateral renal cysts and additional  subcentimeter hypodense lesions which are not characterized on this CT. There is a 3 cm high attenuating lesion in the interpolar right kidney which is not characterized on this CT and may represent a proteinaceous or hemorrhagic cyst. A solid mass is not excluded further evaluation with ultrasound or MRI on a nonemergent/outpatient basis recommended. The visualized ureters appear unremarkable. The urinary bladder is decompressed around a Foley catheter. There is moderate amount of high attenuating content within the bladder consistent with blood products/clot. Stomach/Bowel: There is moderate sigmoid diverticulosis without active inflammatory changes. There is moderate stool throughout the colon. No bowel obstruction or active inflammation. The appendix is normal. Vascular/Lymphatic: Mild aortoiliac atherosclerotic disease. The IVC is unremarkable. No portal venous gas. There is no adenopathy. Reproductive: Mildly enlarged prostate gland measuring 5.3 cm in transverse axial diameter. The seminal vesicles are symmetric. Other: Bilateral anterior abdominal wall subcutaneous densities consistent with injections. Musculoskeletal: Degenerative changes of the spine and left hip. No acute osseous pathology. IMPRESSION: 1. No hydronephrosis or nephrolithiasis. 2. Moderate amount of blood products/clot within the urinary bladder. 3. A 3 cm indeterminate high attenuating lesion in the interpolar right kidney. Further evaluation with ultrasound or MRI on a nonemergent/outpatient basis recommended. 4. Moderate sigmoid diverticulosis. No bowel obstruction. Normal appendix. 5. Small bilateral pleural effusions and bibasilar atelectasis. 6.  Aortic Atherosclerosis (ICD10-I70.0). Electronically Signed   By: Elgie Collard M.D.   On: 08/03/2023 17:25   DG CHEST PORT 1 VIEW Result Date: 08/01/2023 CLINICAL DATA:  Pleural effusion. EXAM: PORTABLE CHEST 1 VIEW COMPARISON:  07/28/2023 FINDINGS: Right lung clear. Left base  collapse/consolidation with small left pleural effusion, slightly decreased in the interval. The cardio pericardial silhouette is enlarged. No acute bony abnormality. Telemetry leads overlie the chest. IMPRESSION: Left base collapse/consolidation with small left pleural effusion, slightly decreased in the interval. Electronically Signed   By: Kennith Center M.D.   On: 08/01/2023 18:40   DG CHEST PORT 1 VIEW Result Date: 07/28/2023 CLINICAL DATA:  142230 Pleural effusion 142230 EXAM: PORTABLE CHEST 1 VIEW COMPARISON:  July 26, 2023 FINDINGS: The cardiomediastinal silhouette is unchanged in contour. Moderate LEFT pleural effusion, similar comparison to prior. Trace RIGHT pleural effusion. No pneumothorax. Similar homogeneous opacification of the LEFT lung base. IMPRESSION: Similar  moderate LEFT pleural effusion with associated LEFT basilar opacification. Electronically Signed   By: Meda Klinefelter M.D.   On: 07/28/2023 10:02   DG CHEST PORT 1 VIEW Result Date: 07/26/2023 CLINICAL DATA:  Post thoracentesis EXAM: PORTABLE CHEST 1 VIEW COMPARISON:  07/25/2023 FINDINGS: Decreasing left pleural effusion following thoracentesis. No pneumothorax. Small bilateral pleural effusions with bibasilar opacities, likely atelectasis. Cardiomegaly. No overt edema. IMPRESSION: Decreasing left effusion following thoracentesis.  No pneumothorax. Small bilateral effusions with bibasilar atelectasis. Cardiomegaly. Electronically Signed   By: Charlett Nose M.D.   On: 07/26/2023 20:05   DG CHEST PORT 1 VIEW Result Date: 07/25/2023 CLINICAL DATA:  Respiratory failure.  Pleural effusion EXAM: PORTABLE CHEST 1 VIEW, portable upright COMPARISON:  X-ray 07/22/2023 and older.  CT 07/21/2023. FINDINGS: Persistent large left effusion, similar to previous. Obscuration of the left cardiac border. No pneumothorax. Tiny right effusion. No edema. Decreasing vascular congestion. Enlarged cardiopericardial silhouette. Film is rotated to the  left. Overlapping cardiac leads. Degenerative changes along the spine. IMPRESSION: Decreasing vascular congestion. Right effusion appears slightly smaller today. This could be technical. Electronically Signed   By: Karen Kays M.D.   On: 07/25/2023 12:00   ECHOCARDIOGRAM COMPLETE Result Date: 07/23/2023    ECHOCARDIOGRAM REPORT   Patient Name:   KJELL KIELAR [facility] Date of Exam: 07/23/2023 Medical Rec #:  [id number]         Height:       66.0 in Accession #:    [id number]        Weight:       243.6 lb Date of Birth:  1950-12-28          BSA:          2.175 m Patient Age:    72 years          BP:           129/72 mmHg Patient Gender: M                 HR:           76 bpm. Exam Location:  Inpatient Procedure: 2D Echo, Color Doppler and Cardiac Doppler Indications:    Dyspnea  History:        Patient has prior history of Echocardiogram examinations, most                 recent 01/26/2023. CHF, CAD, CKD; Risk Factors:Hypertension and                 Dyslipidemia.  Sonographer:    Milbert Coulter Referring Phys: [id number] PREETHA JOSEPH IMPRESSIONS  1. Left ventricular ejection fraction, by estimation, is 65 to 70%. The left ventricle has normal function. The left ventricle has no regional wall motion abnormalities. There is moderate left ventricular hypertrophy. Left ventricular diastolic parameters are indeterminate.  2. Right ventricular systolic function is normal. The right ventricular size is normal.  3. Left atrial size was moderately dilated.  4. The mitral valve is abnormal. Trivial mitral valve regurgitation. No evidence of mitral stenosis.  5. The aortic valve is tricuspid. There is mild calcification of the aortic valve. Aortic valve regurgitation is not visualized. Aortic valve sclerosis is present, with no evidence of aortic valve stenosis.  6. The inferior vena cava is normal in size with greater than 50% respiratory variability, suggesting right atrial pressure of 3 mmHg. FINDINGS  Left Ventricle: Left ventricular  ejection fraction, by estimation, is 65 to 70%. The left ventricle has normal function.  The left ventricle has no regional wall motion abnormalities. The left ventricular internal cavity size was small. There is moderate  left ventricular hypertrophy. Left ventricular diastolic parameters are indeterminate. Right Ventricle: The right ventricular size is normal. No increase in right ventricular wall thickness. Right ventricular systolic function is normal. Left Atrium: Left atrial size was moderately dilated. Right Atrium: Right atrial size was normal in size. Pericardium: There is no evidence of pericardial effusion. Mitral Valve: The mitral valve is abnormal. There is mild thickening of the mitral valve leaflet(s). Trivial mitral valve regurgitation. No evidence of mitral valve stenosis. Tricuspid Valve: The tricuspid valve is normal in structure. Tricuspid valve regurgitation is not demonstrated. No evidence of tricuspid stenosis. Aortic Valve: The aortic valve is tricuspid. There is mild calcification of the aortic valve. Aortic valve regurgitation is not visualized. Aortic valve sclerosis is present, with no evidence of aortic valve stenosis. Aortic valve mean gradient measures 6.0 mmHg. Aortic valve peak gradient measures 12.2 mmHg. Aortic valve area, by VTI measures 2.83 cm. Pulmonic Valve: The pulmonic valve was normal in structure. Pulmonic valve regurgitation is mild. No evidence of pulmonic stenosis. Aorta: The aortic root is normal in size and structure. Venous: The inferior vena cava is normal in size with greater than 50% respiratory variability, suggesting right atrial pressure of 3 mmHg. IAS/Shunts: The interatrial septum was not well visualized.  LEFT VENTRICLE PLAX 2D LVIDd:         4.50 cm   Diastology LVIDs:         2.80 cm   LV e' medial:    8.81 cm/s LV PW:         1.40 cm   LV E/e' medial:  14.6 LV IVS:        1.40 cm   LV e' lateral:   7.62 cm/s LVOT diam:     2.10 cm   LV E/e' lateral: 16.9  LV SV:         74 LV SV Index:   34 LVOT Area:     3.46 cm  RIGHT VENTRICLE RV S prime:     13.90 cm/s TAPSE (M-mode): 1.7 cm LEFT ATRIUM             Index        RIGHT ATRIUM           Index LA diam:        4.30 cm 1.98 cm/m   RA Area:     31.00 cm LA Vol (A2C):   76.5 ml 35.18 ml/m  RA Volume:   125.00 ml 57.48 ml/m LA Vol (A4C):   70.2 ml 32.28 ml/m LA Biplane Vol: 73.1 ml 33.61 ml/m  AORTIC VALVE AV Area (Vmax):    2.89 cm AV Area (Vmean):   2.68 cm AV Area (VTI):     2.83 cm AV Vmax:           175.00 cm/s AV Vmean:          115.000 cm/s AV VTI:            0.262 m AV Peak Grad:      12.2 mmHg AV Mean Grad:      6.0 mmHg LVOT Vmax:         146.00 cm/s LVOT Vmean:        89.100 cm/s LVOT VTI:          0.214 m LVOT/AV VTI ratio: 0.82  AORTA Ao Root diam: 3.60 cm Ao  Asc diam:  3.70 cm MITRAL VALVE MV Area (PHT): 3.34 cm     SHUNTS MV Decel Time: 227 msec     Systemic VTI:  0.21 m MV E velocity: 129.00 cm/s  Systemic Diam: 2.10 cm MV A velocity: 107.00 cm/s MV E/A ratio:  1.21 Charlton Haws MD Electronically signed by Charlton Haws MD Signature Date/Time: 07/23/2023/4:35:16 PM    Final    DG CHEST PORT 1 VIEW Result Date: 07/22/2023 CLINICAL DATA:  Shortness of breath and fatigue EXAM: PORTABLE CHEST 1 VIEW COMPARISON:  07/21/2023 FINDINGS: Cardiac shadow remains enlarged. Central vascular congestion is again identified. Bilateral effusions are noted left considerably greater than right. The overall appearance is similar to that seen on the prior exam. IMPRESSION: Persistent effusions and vascular congestion consistent with CHF. Electronically Signed   By: Alcide Clever M.D.   On: 07/22/2023 21:27   CT CHEST ABDOMEN PELVIS WO CONTRAST Result Date: 07/21/2023 CLINICAL DATA:  Follow-up aneurysm. EXAM: CT CHEST, ABDOMEN AND PELVIS WITHOUT CONTRAST TECHNIQUE: Multidetector CT imaging of the chest, abdomen and pelvis was performed following the standard protocol without IV contrast. RADIATION DOSE REDUCTION:  This exam was performed according to the departmental dose-optimization program which includes automated exposure control, adjustment of the mA and/or kV according to patient size and/or use of iterative reconstruction technique. COMPARISON:  Cardiac CT 01/12/2022 FINDINGS: CT CHEST FINDINGS Cardiovascular: Heart is moderately enlarged. Aorta is normal in size. There is no pericardial effusion. There is mild calcified atherosclerotic disease throughout the aorta. Mediastinum/Nodes: No enlarged mediastinal, hilar, or axillary lymph nodes. Thyroid gland, trachea, and esophagus demonstrate no significant findings. Lungs/Pleura: Small right and moderate left pleural effusions are present. There is compressive atelectasis of the entire left lower lobe. There is compressive atelectasis of the portion of the left upper lobe. There is also atelectasis in the right lower lobe. No evidence for pneumothorax. Musculoskeletal: There is bilateral gynecomastia, right greater than left. No acute fractures are seen. CT ABDOMEN PELVIS FINDINGS Hepatobiliary: No focal liver abnormality is seen. No gallstones, gallbladder wall thickening, or biliary dilatation. Pancreas: Unremarkable. No pancreatic ductal dilatation or surrounding inflammatory changes. Spleen: Normal in size without focal abnormality. Adrenals/Urinary Tract: Bilateral renal cysts are present measuring up to 2.2 cm. There is no hydronephrosis. There is mild bilateral perinephric fat stranding. No urinary tract calculi are seen. Adrenal glands and bladder are within normal limits. Stomach/Bowel: Stomach is within normal limits. Appendix appears normal. No evidence of bowel wall thickening, distention, or inflammatory changes. There is sigmoid colon diverticulosis. Vascular/Lymphatic: No significant vascular findings are present. No enlarged abdominal or pelvic lymph nodes. Reproductive: Prostate gland is enlarged. Other: There is mild diffuse body wall edema.  There is  no ascites. Musculoskeletal: No fracture is seen. IMPRESSION: 1. No aneurysm identified. 2. Moderate left and small right pleural effusions with compressive atelectasis of the left lower lobe and portion of the left upper lobe. 3. Cardiomegaly. 4. Mild bilateral perinephric fat stranding. Correlate clinically for pyelonephritis. 5. Mild diffuse body wall edema. 6. Bilateral renal cysts. No follow-up imaging recommended. 7. Sigmoid colon diverticulosis. 8. Prostatomegaly. 9. Aortic atherosclerosis. Aortic Atherosclerosis (ICD10-I70.0). Electronically Signed   By: Darliss Cheney M.D.   On: 07/21/2023 19:51   DG Chest Portable 1 View Result Date: 07/21/2023 CLINICAL DATA:  SOB EXAM: PORTABLE CHEST 1 VIEW COMPARISON:  01/08/2004. FINDINGS: Cardiac silhouette appears prominent and partially obscured by large left-sided pleural effusion. Small right-sided pleural effusion. Pulmonary vascular congestion consistent with pulmonary edema. No  pneumothorax. There are thoracic degenerative changes. IMPRESSION: Findings consistent with CHF. Electronically Signed   By: Layla Maw M.D.   On: 07/21/2023 15:51    Labs:  CBC: Recent Labs    07/27/23 0256 08/01/23 0256 08/03/23 1230 08/04/23 0244  WBC 5.5 7.5 7.9 7.4  HGB 8.3* 8.1* 7.7* 7.4*  HCT 26.8* 26.3* 24.5* 23.7*  PLT 96* 151 181 176    COAGS: No results for input(s): "INR", "APTT" in the last 8760 hours.  BMP: Recent Labs    08/01/23 0256 08/02/23 0257 08/03/23 0738 08/04/23 0244  NA 132* 134* 132* 132*  K 4.3 4.6 4.3 5.0  CL 84* 82* 81* 81*  CO2 36* 37* 37* 35*  GLUCOSE 121* 124* 108* 114*  BUN 186* 191* 199* 208*  CALCIUM 9.7 10.3 10.1 10.0  CREATININE 3.14* 3.42* 3.78* 4.40*  GFRNONAA 20* 18* 16* 13*    LIVER FUNCTION TESTS: Recent Labs    07/21/23 1415 07/23/23 0229 07/24/23 0259 07/25/23 0252 07/29/23 0249 07/30/23 0224 07/31/23 0308 08/01/23 0256 08/02/23 0257  BILITOT 0.8 0.8 1.3* 1.3*  --   --   --   --   --    AST 30 20 26 28   --   --   --   --   --   ALT 32 23 25 27   --   --   --   --   --   ALKPHOS 69 61 54 47  --   --   --   --   --   PROT 7.5 7.1 7.6 7.3  --   --   --   --   --   ALBUMIN 3.1* 2.9* 3.6 3.2*   < > 3.1* 3.1* 3.3* 3.5   < > = values in this interval not displayed.    TUMOR MARKERS: No results for input(s): "AFPTM", "CEA", "CA199", "CHROMGRNA" in the last 8760 hours.  Assessment and Plan:  Renal failure with uremia- need for hemodialysis.  Will proceed with image guided placement of a temporary catheter today (given risk of bleeding) by Dr. Elby Showers.  Can consider tunneled catheter next week if dialysis continues to be necessary.  Risks and benefits discussed with the patient and his wife including, but not limited to bleeding, infection, vascular injury, pneumothorax which may require chest tube placement, air embolism or even death  All questions were answered, patient is agreeable to proceed. Consent signed and in chart.   Thank you for allowing our service to participate in Andrew Schmitt 's care.  Electronically Signed: Gwynneth Macleod, PA-C   08/04/2023, 10:01 AM      I spent a total of 20 Minutes  in face to face in clinical consultation, greater than 50% of which was counseling/coordinating care for HD cath placement.

## 2023-08-04 NOTE — Progress Notes (Signed)
OT Cancellation Note  Patient Details Name: PRIYAM WOODARDS MRN: [id number] DOB: [birth year]   Cancelled Treatment:    Reason Eval/Treat Not Completed: Fatigue/lethargy limiting ability to participate;Other (comment) Pt sleeping on entry, did not awaken to voice. Noted plan for HD catheter today and potential HD afterwards. Family at bedside agreeable for therapy to follow up tomorrow.  Lorre Munroe 08/04/2023, 2:34 PM

## 2023-08-04 NOTE — Progress Notes (Signed)
PROGRESS NOTE    Andrew Schmitt  VOZ:[id number] DOB: [birth year] DOA: 07/21/2023 PCP: Dorothyann Peng, MD  [name] with chronic diastolic CHF, hypertension, dyslipidemia, ascending aortic aneurysm, BPH, CKD 3b/4, chronic anemia, gout, obesity presented to the ED with progressive weakness and dyspnea x7d. In the ED,  lethargic, hypoxic, bradycardic, placed on NRM   Labs-Na 135,bun 63, cr 3,18 , BNP 183, troponin 337, 278, 309 CXR wbilateral hilar vascular congestion, bilateral pleural effusions, moderate to large on Left. CT chest, A&P: -moderate left pleural effusion with compressive atelectasis, Mild diffuse body wall edema.  -12/6: started on diuretics, Coude catheter placed for hematuria w/ CLots and increased discomfort before overt retention -12/7 more encephalopathic, placed on BiPAP, very mild respiratory acidosis, pCO2 in the 60-70 range -Complicated by worsening AKI, nephrology consulting, diuretic dose increased 12/14 persistent elevation in BUN.  12/15 resumed diuretic therapy  12/16 transitioned to oral loop diuretic therapy.  12/17 holding diuretic therapy, persistent severe BUN elevation 12/18: Gross hematuria with clots after tugging at catheter same morning, Urology consulted, started on continuous bladder irrigation, Foley replaced  Subjective: No new symptoms, started on CBI yesterday  Assessment and Plan:  Acute on chronic diastolic CHF Left Pleural effusion Last echo 6/24 with EF 60-65%, moderate LVH, grade 2 DD, mildly reduced RV -Diuresed on IV Lasix, cards and nephrology following, down 30 LB, 21.7 L negative -Repeat echo with EF 65-70% moderate LVH, indeterminate diastolic parameters, normal RV -Creatinine continues to worsen, appreciate nephrology input, diuretics now held X48 hours -GDMT limited by AKI/CKD -Pulmonary c/s:: L Thoracentesis 12/10: exudative on Thora, cytology negative -Plan to start HD  Acute hypoxemic hyercapnic respiratory failure, due to  acute cardiogenic pulmonary edema, left pleural effusion, complicated with acute metabolic encephalopathy.  12/10 SP left thoracentesis 1100 ml drained -Monitor x-rays periodically  AKI on CKD stage 3b. Hyponatremia,.  -cardiorenal,  baseline creatinine of 2-2.5  -Foley catheter placed 12/6 for hematuria with clots, ? early retention, has known BPH, CT on admission with bilateral renal cysts as well -Creatinine worsening, per nephrology, diuretics held now -BUN over 200 now, discussed with nephrology, plans to start HD today after catheter placement  Gross hematuria Traumatic Foley dislodgement BPH (benign prostatic hyperplasia) Right kidney lesion noted on CT Continue with flomax and proscar  -Foley catheter/bulb was dislodged after tugging yesterday morning, this was replaced, appreciate urology input, continue CBI -Eventually needs workup, renal lesion noted on imaging  Anemia of chronic renal disease stable  Second degree AV block, Mobitz type I Continue to hold on AV blockers.  Obesity, class 2 Calculated BMI is 37,4  DVT prophylaxis: Hep SQ-> held 12/18 Code Status: Full Code Family Communication: Wife at bedside Disposition Plan: To be determined  Consultants:    Procedures:   Antimicrobials:    Objective: Vitals:   08/04/23 0014 08/04/23 0443 08/04/23 0740 08/04/23 1125  BP: 120/61 124/60 109/62 (!) 104/58  Pulse: 74 73 73 (!) 59  Resp: 17 19 17 17   Temp: 98.9 F (37.2 C) 98.5 F (36.9 C) 98 F (36.7 C) 98.3 F (36.8 C)  TempSrc: Oral Oral Oral Oral  SpO2: 100% 100% 100% 100%  Weight:  103.3 kg    Height:        Intake/Output Summary (Last 24 hours) at 08/04/2023 1219 Last data filed at 08/04/2023 1140 Gross per 24 hour  Intake 42595 ml  Output 19050 ml  Net -1910 ml   Filed Weights   08/02/23 0719 08/03/23 0312 08/04/23 0443  Weight: 100.5 kg 101.2 kg 103.3 kg    Examination:  Gen: Elderly frail male laying in bed, AAO x 2, slightly  somnolent, asterixis noted HEENT: No JVD CVS: S1-S2, regular rhythm Lungs: Decreased breath sounds at the bases Abdomen: Soft, nontender, bowel sounds present Extremities: Trace edema Skin: no new rashes on exposed skin     Data Reviewed:   CBC: Recent Labs  Lab 08/01/23 0256 08/03/23 1230 08/04/23 0244  WBC 7.5 7.9 7.4  HGB 8.1* 7.7* 7.4*  HCT 26.3* 24.5* 23.7*  MCV 96.0 93.9 94.8  PLT 151 181 176   Basic Metabolic Panel: Recent Labs  Lab 07/29/23 0249 07/30/23 0224 07/31/23 0308 08/01/23 0256 08/02/23 0257 08/03/23 0738 08/04/23 0244  NA 134* 133* 132* 132* 134* 132* 132*  K 4.2 4.4 4.3 4.3 4.6 4.3 5.0  CL 86* 87* 84* 84* 82* 81* 81*  CO2 35* 34* 36* 36* 37* 37* 35*  GLUCOSE 139* 130* 138* 121* 124* 108* 114*  BUN 149* 162* 176* 186* 191* 199* 208*  CREATININE 3.82* 3.47* 3.33* 3.14* 3.42* 3.78* 4.40*  CALCIUM 9.2 9.3 9.4 9.7 10.3 10.1 10.0  MG  --   --   --   --   --  2.6*  --   PHOS 5.0* 5.2* 4.6 3.2 4.1  --   --    GFR: Estimated Creatinine Clearance: 16.6 mL/min (A) (by C-G formula based on SCr of 4.4 mg/dL (H)). Liver Function Tests: Recent Labs  Lab 07/29/23 0249 07/30/23 0224 07/31/23 0308 08/01/23 0256 08/02/23 0257  ALBUMIN 3.1* 3.1* 3.1* 3.3* 3.5   No results for input(s): "LIPASE", "AMYLASE" in the last 168 hours. No results for input(s): "AMMONIA" in the last 168 hours. Coagulation Profile: No results for input(s): "INR", "PROTIME" in the last 168 hours. Cardiac Enzymes: No results for input(s): "CKTOTAL", "CKMB", "CKMBINDEX", "TROPONINI" in the last 168 hours. BNP (last 3 results) No results for input(s): "PROBNP" in the last 8760 hours. HbA1C: No results for input(s): "HGBA1C" in the last 72 hours. CBG: Recent Labs  Lab 07/29/23 2111  GLUCAP 145*   Lipid Profile: No results for input(s): "CHOL", "HDL", "LDLCALC", "TRIG", "CHOLHDL", "LDLDIRECT" in the last 72 hours. Thyroid Function Tests: No results for input(s): "TSH",  "T4TOTAL", "FREET4", "T3FREE", "THYROIDAB" in the last 72 hours. Anemia Panel: No results for input(s): "VITAMINB12", "FOLATE", "FERRITIN", "TIBC", "IRON", "RETICCTPCT" in the last 72 hours. Urine analysis:    Component Value Date/Time   COLORURINE AMBER (A) 07/29/2023 1033   APPEARANCEUR CLOUDY (A) 07/29/2023 1033   LABSPEC 1.013 07/29/2023 1033   PHURINE 5.0 07/29/2023 1033   GLUCOSEU NEGATIVE 07/29/2023 1033   HGBUR LARGE (A) 07/29/2023 1033   BILIRUBINUR NEGATIVE 07/29/2023 1033   BILIRUBINUR negative 04/09/2021 0954   KETONESUR NEGATIVE 07/29/2023 1033   PROTEINUR 100 (A) 07/29/2023 1033   UROBILINOGEN 0.2 04/09/2021 0954   NITRITE NEGATIVE 07/29/2023 1033   LEUKOCYTESUR NEGATIVE 07/29/2023 1033   Sepsis Labs: @LABRCNTIP (procalcitonin:4,lacticidven:4)  ) Recent Results (from the past 240 hours)  Pleural fluid culture w Gram Stain     Status: None   Collection Time: 07/26/23  1:38 PM   Specimen: Pleural Fluid  Result Value Ref Range Status   Specimen Description PLEURAL  Final   Special Requests NONE  Final   Gram Stain   Final    FEW WBC PRESENT, PREDOMINANTLY MONONUCLEAR NO ORGANISMS SEEN    Culture   Final    NO GROWTH 3 DAYS Performed at [facility]  Lab, [address], [place], [place] [address]    Report Status 07/29/2023 FINAL  Final     Radiology Studies: CT ABDOMEN PELVIS WO CONTRAST Result Date: 08/03/2023 CLINICAL DATA:  Hematuria. EXAM: CT ABDOMEN AND PELVIS WITHOUT CONTRAST TECHNIQUE: Multidetector CT imaging of the abdomen and pelvis was performed following the standard protocol without IV contrast. RADIATION DOSE REDUCTION: This exam was performed according to the departmental dose-optimization program which includes automated exposure control, adjustment of the mA and/or kV according to patient size and/or use of iterative reconstruction technique. COMPARISON:  CT of the chest abdomen pelvis dated 07/21/2023. FINDINGS: Evaluation of this exam is  limited in the absence of intravenous contrast. Lower chest: Small bilateral pleural effusions and bibasilar atelectasis. No intra-abdominal free air or free fluid. Hepatobiliary: The liver is unremarkable. No biliary dilatation. The gallbladder is unremarkable Pancreas: The pancreas is unremarkable as visualized. Spleen: Normal in size without focal abnormality. Adrenals/Urinary Tract: The adrenal glands are unremarkable. There is no hydronephrosis or nephrolithiasis on either side. Bilateral renal cysts and additional subcentimeter hypodense lesions which are not characterized on this CT. There is a 3 cm high attenuating lesion in the interpolar right kidney which is not characterized on this CT and may represent a proteinaceous or hemorrhagic cyst. A solid mass is not excluded further evaluation with ultrasound or MRI on a nonemergent/outpatient basis recommended. The visualized ureters appear unremarkable. The urinary bladder is decompressed around a Foley catheter. There is moderate amount of high attenuating content within the bladder consistent with blood products/clot. Stomach/Bowel: There is moderate sigmoid diverticulosis without active inflammatory changes. There is moderate stool throughout the colon. No bowel obstruction or active inflammation. The appendix is normal. Vascular/Lymphatic: Mild aortoiliac atherosclerotic disease. The IVC is unremarkable. No portal venous gas. There is no adenopathy. Reproductive: Mildly enlarged prostate gland measuring 5.3 cm in transverse axial diameter. The seminal vesicles are symmetric. Other: Bilateral anterior abdominal wall subcutaneous densities consistent with injections. Musculoskeletal: Degenerative changes of the spine and left hip. No acute osseous pathology. IMPRESSION: 1. No hydronephrosis or nephrolithiasis. 2. Moderate amount of blood products/clot within the urinary bladder. 3. A 3 cm indeterminate high attenuating lesion in the interpolar right kidney.  Further evaluation with ultrasound or MRI on a nonemergent/outpatient basis recommended. 4. Moderate sigmoid diverticulosis. No bowel obstruction. Normal appendix. 5. Small bilateral pleural effusions and bibasilar atelectasis. 6.  Aortic Atherosclerosis (ICD10-I70.0). Electronically Signed   By: Elgie Collard M.D.   On: 08/03/2023 17:25     Scheduled Meds:  calcium acetate  667 mg Oral TID WC   Chlorhexidine Gluconate Cloth  6 each Topical Daily   Chlorhexidine Gluconate Cloth  6 each Topical Q0600   darbepoetin (ARANESP) injection - DIALYSIS  60 mcg Subcutaneous Q Thu-1800   feeding supplement  237 mL Oral BID BM   finasteride  5 mg Oral Daily   guaiFENesin  600 mg Oral BID   polyethylene glycol  17 g Oral BID   rosuvastatin  10 mg Oral Daily   tamsulosin  0.4 mg Oral Daily   Continuous Infusions:  desmopressin (DDAVP) 20 mcg in sodium chloride 0.9 % 50 mL IVPB     sodium chloride irrigation       LOS: 13 days    Time spent:    Zannie Cove, MD [facility]   08/04/2023, 12:19 PM

## 2023-08-04 NOTE — Progress Notes (Signed)
Patient ID: Andrew Schmitt, male   DOB: [birth year], 72 y.o.   MRN: [id number] [facility] Progress Note   Assessment/ Plan:   1.  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   Continue to hold ARB and SGLT2 inhibitor for now.   With progressive azotemia and now uremia (asterixus) even after holding all diuretics need to start RRT.  Pt/family agree. IR consult for HD cath  -- temp today given bleeding risk HD thereafter, tx #1 Desmopressin on call to IR  LIkely HD again tomorrow, will reassess first  2.  Acute exacerbation of HFpEF Vol status improved, diuretics as above -- holding 3.  Encephalopathy: maybe some worsening in past 24h from uremia 4.  Anemia: Secondary to chronic disease including chronic kidney disease.  IV iron was given; CTM; give ESA today.   5.  Hypertension: Blood pressure stable.  HTN meds stopped 6. Pleural effusion: s/p thora predominantly lymphocytic. Some malignancy concern. First cytology negative for malignant cells  Subjective:    Feels ok but seems weaker MOre asterixus today BUN and SCr further up after at least 36h w/o any diuretics Still on CBI for hematuria  UOP hard to quantify with CBI but seems like > 1L Dtr Andrew Schmitt joined [place] by phone; wife Andrew Schmitt at bedside At length discussed recommendaiton to start RRT today and not wait any longer Patient understandably with reservations but does agree    Objective:   BP 109/62 (BP Location: Left Arm)   Pulse 73   Temp 98 F (36.7 C) (Oral)   Resp 17   Ht 5\' 6"  (1.676 m)   Wt 103.3 kg   SpO2 100%   BMI 36.76 kg/m   Intake/Output Summary (Last 24 hours) at 08/04/2023 1324 Last data filed at 08/04/2023 4010 Gross per 24 hour  Intake 27253 ml  Output 19550 ml  Net -1390 ml   Weight change: 2.828 kg  Physical Exam: Gen: in bed, thought  processes a tad slower, has asterixus on exam CVS: normal rate, no murmurs Resp: Bilateral chest rise with no increased work of breathing; diminished throughout Abd: Soft, obese, nontender, bowel sounds normal Ext: trace bilateral lower extremity edema   Urine sediment exam on 12/13: 10-20 RBCs per high-powered field none with dysmorphic features, rare renal tubular epithelial cells, rare WBCs, no muddy brown casts, no cellular casts, 1 lipid-laden cast.  Imaging: CT ABDOMEN PELVIS WO CONTRAST Result Date: 08/03/2023 CLINICAL DATA:  Hematuria. EXAM: CT ABDOMEN AND PELVIS WITHOUT CONTRAST TECHNIQUE: Multidetector CT imaging of the abdomen and pelvis was performed following the standard protocol without IV contrast. RADIATION DOSE REDUCTION: This exam was performed according to the departmental dose-optimization program which includes automated exposure control, adjustment of the mA and/or kV according to patient size and/or use of iterative reconstruction technique. COMPARISON:  CT of the chest abdomen pelvis dated 07/21/2023. FINDINGS: Evaluation of this exam is limited in the absence of intravenous contrast. Lower chest: Small bilateral pleural effusions and bibasilar atelectasis. No intra-abdominal free air or free fluid. Hepatobiliary: The liver is unremarkable. No biliary dilatation. The gallbladder is unremarkable Pancreas: The pancreas is unremarkable as visualized. Spleen: Normal in size without focal abnormality. Adrenals/Urinary Tract: The adrenal glands are unremarkable. There is no hydronephrosis or nephrolithiasis on either side. Bilateral renal cysts and additional subcentimeter hypodense lesions which are not characterized on this CT. There is  a 3 cm high attenuating lesion in the interpolar right kidney which is not characterized on this CT and may represent a proteinaceous or hemorrhagic cyst. A solid mass is not excluded further evaluation with ultrasound or MRI on a  nonemergent/outpatient basis recommended. The visualized ureters appear unremarkable. The urinary bladder is decompressed around a Foley catheter. There is moderate amount of high attenuating content within the bladder consistent with blood products/clot. Stomach/Bowel: There is moderate sigmoid diverticulosis without active inflammatory changes. There is moderate stool throughout the colon. No bowel obstruction or active inflammation. The appendix is normal. Vascular/Lymphatic: Mild aortoiliac atherosclerotic disease. The IVC is unremarkable. No portal venous gas. There is no adenopathy. Reproductive: Mildly enlarged prostate gland measuring 5.3 cm in transverse axial diameter. The seminal vesicles are symmetric. Other: Bilateral anterior abdominal wall subcutaneous densities consistent with injections. Musculoskeletal: Degenerative changes of the spine and left hip. No acute osseous pathology. IMPRESSION: 1. No hydronephrosis or nephrolithiasis. 2. Moderate amount of blood products/clot within the urinary bladder. 3. A 3 cm indeterminate high attenuating lesion in the interpolar right kidney. Further evaluation with ultrasound or MRI on a nonemergent/outpatient basis recommended. 4. Moderate sigmoid diverticulosis. No bowel obstruction. Normal appendix. 5. Small bilateral pleural effusions and bibasilar atelectasis. 6.  Aortic Atherosclerosis (ICD10-I70.0). Electronically Signed   By: Elgie Collard M.D.   On: 08/03/2023 17:25     Labs: BMET Recent Labs  Lab 07/29/23 0249 07/30/23 0224 07/31/23 0308 08/01/23 0256 08/02/23 0257 08/03/23 0738 08/04/23 0244  NA 134* 133* 132* 132* 134* 132* 132*  K 4.2 4.4 4.3 4.3 4.6 4.3 5.0  CL 86* 87* 84* 84* 82* 81* 81*  CO2 35* 34* 36* 36* 37* 37* 35*  GLUCOSE 139* 130* 138* 121* 124* 108* 114*  BUN 149* 162* 176* 186* 191* 199* 208*  CREATININE 3.82* 3.47* 3.33* 3.14* 3.42* 3.78* 4.40*  CALCIUM 9.2 9.3 9.4 9.7 10.3 10.1 10.0  PHOS 5.0* 5.2* 4.6 3.2 4.1   --   --    CBC Recent Labs  Lab 08/01/23 0256 08/03/23 1230 08/04/23 0244  WBC 7.5 7.9 7.4  HGB 8.1* 7.7* 7.4*  HCT 26.3* 24.5* 23.7*  MCV 96.0 93.9 94.8  PLT 151 181 176    Medications:     calcium acetate  667 mg Oral TID WC   Chlorhexidine Gluconate Cloth  6 each Topical Daily   Chlorhexidine Gluconate Cloth  6 each Topical Q0600   feeding supplement  237 mL Oral BID BM   finasteride  5 mg Oral Daily   guaiFENesin  600 mg Oral BID   polyethylene glycol  17 g Oral BID   rosuvastatin  10 mg Oral Daily   tamsulosin  0.4 mg Oral Daily   Arita Miss  08/04/2023, 9:37 AM

## 2023-08-04 NOTE — Procedures (Signed)
Interventional Radiology Procedure Note  Procedure: Image guided temporary hemodialysis catheter placement  Findings: Please refer to procedural dictation for full description. Right internal jugular vein 20 cm Trialysis, catheter tip in right atrium.  Complications: None immediate  Estimated Blood Loss: < 5 mL  Recommendations: Catheter ready for immediate use.   Marliss Coots, MD

## 2023-08-04 NOTE — Plan of Care (Signed)

## 2023-08-04 NOTE — Progress Notes (Signed)
9 Days Post-Op Subjective: Met with patient and his wife today.  Both incredibly kind of people.  He feels much better and was appreciative of our assistance yesterday.  CBI on low gtt. with a few small clot obstructions overnight.  Markedly improved compared to yesterday.  Reviewed case and plan with them both and all questions were answered to their satisfaction  Objective: Vital signs in last 24 hours: Temp:  [98 F (36.7 C)-98.9 F (37.2 C)] 98.3 F (36.8 C) (12/19 1125) Pulse Rate:  [59-80] 59 (12/19 1125) Resp:  [17-19] 17 (12/19 1125) BP: (93-124)/(56-62) 104/58 (12/19 1125) SpO2:  [96 %-100 %] 100 % (12/19 1125) Weight:  [103.3 kg] 103.3 kg (12/19 0443)  Assessment/Plan: # Hematuria # Traumatic Foley removal # Uremic bleeding # Clot obstruction of urine   Over wire placement of council hematuria catheter fashioned from a 31f Rusch.    Only around 50cc of clot material hand irrigated.  Catheter is somewhat positional and nearly hubbed despite a calculated prostate size around 60 to 75g.  CT hematuria to rule out large consolidated clot.   CT hematuria collected which does reveal some residual clot burden which I was unable to evacuate with hand irrigation.  CBI is running well and while he still may need to go to the OR for clot evacuation, I will defer this until after initiation of his dialysis.  I am still hopeful that once his uremia is corrected that his bleeding will subside naturally.  Continue CBI and hand irrigation as planned in the meantime.    NPO at midnight   Stat CBC, trend H&H.  Transfuse for Hgb less than 8.0   His prophylactic Q8 heparin has been discontinued. BUN continues to elevate at 208.  Will hopefully be self-limiting once uremia has improved.   We will follow along with you.  Intake/Output from previous day: 12/18 0701 - 12/19 0700 In: [address] [P.O.:720] Out: 07/08/1951 [Urine:05/10/1951]  Intake/Output this shift: Total I/O In: 2100  [Other:2100] Out: 2500 [Urine:2500]  Physical Exam:  General: Alert and oriented CV: No cyanosis Lungs: equal chest rise Abdomen: Soft, NTND, no rebound or guarding Gu: CBI on low gtt with light red irrigant. Flowing well.   Lab Results: Recent Labs    08/03/23 1230 08/04/23 0244  HGB 7.7* 7.4*  HCT 24.5* 23.7*   BMET Recent Labs    08/03/23 0738 08/04/23 0244  NA 132* 132*  K 4.3 5.0  CL 81* 81*  CO2 37* 35*  GLUCOSE 108* 114*  BUN 199* 208*  CREATININE 3.78* 4.40*  CALCIUM 10.1 10.0     Studies/Results: CT ABDOMEN PELVIS WO CONTRAST Result Date: 08/03/2023 CLINICAL DATA:  Hematuria. EXAM: CT ABDOMEN AND PELVIS WITHOUT CONTRAST TECHNIQUE: Multidetector CT imaging of the abdomen and pelvis was performed following the standard protocol without IV contrast. RADIATION DOSE REDUCTION: This exam was performed according to the departmental dose-optimization program which includes automated exposure control, adjustment of the mA and/or kV according to patient size and/or use of iterative reconstruction technique. COMPARISON:  CT of the chest abdomen pelvis dated 07/21/2023. FINDINGS: Evaluation of this exam is limited in the absence of intravenous contrast. Lower chest: Small bilateral pleural effusions and bibasilar atelectasis. No intra-abdominal free air or free fluid. Hepatobiliary: The liver is unremarkable. No biliary dilatation. The gallbladder is unremarkable Pancreas: The pancreas is unremarkable as visualized. Spleen: Normal in size without focal abnormality. Adrenals/Urinary Tract: The adrenal glands are unremarkable. There is no hydronephrosis or nephrolithiasis on  either side. Bilateral renal cysts and additional subcentimeter hypodense lesions which are not characterized on this CT. There is a 3 cm high attenuating lesion in the interpolar right kidney which is not characterized on this CT and may represent a proteinaceous or hemorrhagic cyst. A solid mass is not excluded  further evaluation with ultrasound or MRI on a nonemergent/outpatient basis recommended. The visualized ureters appear unremarkable. The urinary bladder is decompressed around a Foley catheter. There is moderate amount of high attenuating content within the bladder consistent with blood products/clot. Stomach/Bowel: There is moderate sigmoid diverticulosis without active inflammatory changes. There is moderate stool throughout the colon. No bowel obstruction or active inflammation. The appendix is normal. Vascular/Lymphatic: Mild aortoiliac atherosclerotic disease. The IVC is unremarkable. No portal venous gas. There is no adenopathy. Reproductive: Mildly enlarged prostate gland measuring 5.3 cm in transverse axial diameter. The seminal vesicles are symmetric. Other: Bilateral anterior abdominal wall subcutaneous densities consistent with injections. Musculoskeletal: Degenerative changes of the spine and left hip. No acute osseous pathology. IMPRESSION: 1. No hydronephrosis or nephrolithiasis. 2. Moderate amount of blood products/clot within the urinary bladder. 3. A 3 cm indeterminate high attenuating lesion in the interpolar right kidney. Further evaluation with ultrasound or MRI on a nonemergent/outpatient basis recommended. 4. Moderate sigmoid diverticulosis. No bowel obstruction. Normal appendix. 5. Small bilateral pleural effusions and bibasilar atelectasis. 6.  Aortic Atherosclerosis (ICD10-I70.0). Electronically Signed   By: Elgie Collard M.D.   On: 08/03/2023 17:25      LOS: 13 days   Elmon Kirschner, NP [facility] Pager: [phone]  08/04/2023, 11:54 AM

## 2023-08-04 NOTE — Progress Notes (Signed)
Pt had HR of 208 per Press photographer. VS was stable, pt is asymptomatic. EKG was done, NSR. HR is back to 80's. MD was notified.

## 2023-08-05 DIAGNOSIS — I5033 Acute on chronic diastolic (congestive) heart failure: Secondary | ICD-10-CM | POA: Diagnosis not present

## 2023-08-05 LAB — BASIC METABOLIC PANEL
Anion gap: 15 (ref 5–15)
BUN: 147 mg/dL — ABNORMAL HIGH (ref 8–23)
CO2: 28 mmol/L (ref 22–32)
Calcium: 9.3 mg/dL (ref 8.9–10.3)
Chloride: 88 mmol/L — ABNORMAL LOW (ref 98–111)
Creatinine, Ser: 3.65 mg/dL — ABNORMAL HIGH (ref 0.61–1.24)
GFR, Estimated: 17 mL/min — ABNORMAL LOW (ref >60)
Glucose, Bld: 105 mg/dL — ABNORMAL HIGH (ref 70–99)
Potassium: 4.5 mmol/L (ref 3.5–5.1)
Sodium: 131 mmol/L — ABNORMAL LOW (ref 135–145)

## 2023-08-05 LAB — CBC
HCT: 24.3 % — ABNORMAL LOW (ref 39.0–52.0)
Hemoglobin: 7.3 g/dL — ABNORMAL LOW (ref 13.0–17.0)
MCH: 29.7 pg (ref 26.0–34.0)
MCHC: 30 g/dL (ref 30.0–36.0)
MCV: 98.8 fL (ref 80.0–100.0)
Platelets: 157 10*3/uL (ref 150–400)
RBC: 2.46 MIL/uL — ABNORMAL LOW (ref 4.22–5.81)
RDW: 16.1 % — ABNORMAL HIGH (ref 11.5–15.5)
WBC: 8 10*3/uL (ref 4.0–10.5)
nRBC: 0.5 % — ABNORMAL HIGH (ref 0.0–0.2)

## 2023-08-05 LAB — HEPATITIS B SURFACE ANTIBODY, QUANTITATIVE: Hep B S AB Quant (Post): 86.8 m[IU]/mL

## 2023-08-05 MED ORDER — ANTICOAGULANT SODIUM CITRATE 4% (200MG/5ML) IV SOLN: 5.0000 mL | Status: DC | PRN

## 2023-08-05 MED ORDER — ALTEPLASE 2 MG IJ SOLR: 2.0000 mg | Freq: Once | INTRAMUSCULAR | Status: DC | PRN

## 2023-08-05 MED ORDER — HEPARIN SODIUM (PORCINE) 1000 UNIT/ML IJ SOLN
INTRAMUSCULAR | Status: AC
  Administered 2023-08-05: 1000 [IU]
  Filled 2023-08-05: qty 4

## 2023-08-05 MED ORDER — HEPARIN SODIUM (PORCINE) 1000 UNIT/ML DIALYSIS: 1000.0000 [IU] | INTRAMUSCULAR | Status: DC | PRN

## 2023-08-05 NOTE — Progress Notes (Signed)
Patient ID: Andrew Schmitt, male   DOB: 04-May-1951, 72 y.o.   MRN: [id number] [facility] Progress Note   Assessment/ Plan:   1.  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   Continue to hold ARB and SGLT2 inhibitor for now.   With progressive azotemia and now uremia (asterixus) even after holding all diuretics started RRT 08/05/23 with RIJ Temp HD cath (placed 12/19 IR) HD #2 tomorrrow 2.5h, 1.5 L UF, HD cath, no heparin  2.  Acute exacerbation of HFpEF Vol status improved, holding diuretics as above 3.  Encephalopathy: maybe some worsening in past 24h from uremia -- follow 4.  Anemia: Secondary to chronic disease including chronic kidney disease.  IV iron was given; CTM; ESA qThursday 5.  Hypertension: Blood pressure stable.  HTN meds stopped 6. Pleural effusion: s/p thora predominantly lymphocytic. Some malignancy concern. First cytology negative for malignant cells  Subjective:    Temp R internal jugular HD cath with IR yesterday HD #1 overnight, 0.3L, 2h, tolerated well TIred today Still on CBI for hematuria  -- accurate UOP difficult Dtr Felecia Shelling joined [place] by phone; wife Juanita at bedside   Objective:   BP 117/68 (BP Location: Left Arm)   Pulse 66   Temp 97.7 F (36.5 C) (Oral)   Resp 20   Ht 5\' 6"  (1.676 m)   Wt 98 kg   SpO2 100%   BMI 34.87 kg/m   Intake/Output Summary (Last 24 hours) at 08/05/2023 0954 Last data filed at 08/05/2023 4098 Gross per 24 hour  Intake 4200 ml  Output 8250 ml  Net -4050 ml   Weight change: -5.3 kg  Physical Exam: Gen: in bed, sleepy/tired CVS: normal rate, no murmurs Resp: Bilateral chest rise with no increased work of breathing; diminished throughout Abd: Soft, obese, nontender, bowel sounds normal Ext: trace bilateral lower extremity edema  R internal jugular Temp HD  cath clean and intact   Urine sediment exam on 12/13: 10-20 RBCs per high-powered field none with dysmorphic features, rare renal tubular epithelial cells, rare WBCs, no muddy brown casts, no cellular casts, 1 lipid-laden cast.  Imaging: IR Fluoro Guide CV Line Right Result Date: 08/04/2023 INDICATION: 72 year old male with worsening end-stage renal disease requiring initiation of hemodialysis. EXAM: NON-TUNNELED CENTRAL VENOUS HEMODIALYSIS CATHETER PLACEMENT WITH ULTRASOUND AND FLUOROSCOPIC GUIDANCE COMPARISON:  None Available. MEDICATIONS: None FLUOROSCOPY TIME:  Two mGy COMPLICATIONS: None immediate. PROCEDURE: Informed written consent was obtained from the patient after a discussion of the risks, benefits, and alternatives to treatment. Questions regarding the procedure were encouraged and answered. The right neck and chest were prepped with chlorhexidine in a sterile fashion, and a sterile drape was applied covering the operative field. Maximum barrier sterile technique with sterile gowns and gloves were used for the procedure. A timeout was performed prior to the initiation of the procedure. After the overlying soft tissues were anesthetized, a small venotomy incision was created and a micropuncture kit was utilized to access the internal jugular vein. Real-time ultrasound guidance was utilized for vascular access including the acquisition of a permanent ultrasound image documenting patency of the accessed vessel. The microwire was utilized to measure appropriate catheter length. A Rosen wire was advanced to the level of the IVC. Under fluoroscopic guidance, the venotomy was serially dilated, ultimately allowing placement of a 20 cm temporary Trialysis catheter with tip ultimately terminating within  the superior aspect of the right atrium. Final catheter positioning was confirmed and documented with a spot radiographic image. The catheter aspirates and flushes normally. The catheter was flushed with  appropriate volume heparin dwells. The catheter exit site was secured with a 0 silk retention suture. A dressing was placed. The patient tolerated the procedure well without immediate post procedural complication. IMPRESSION: Successful placement of a right internal jugular approach 20 cm temporary dialysis catheter with tip terminating with in the superior aspect of the right atrium. The catheter is ready for immediate use. PLAN: This catheter may be converted to a tunneled dialysis catheter at a later date as indicated. Marliss Coots, MD [facility] Electronically Signed   By: Marliss Coots M.D.   On: 08/04/2023 21:06   IR US Guide Vasc Access Right Result Date: 08/04/2023 INDICATION: 71 year old male with worsening end-stage renal disease requiring initiation of hemodialysis. EXAM: NON-TUNNELED CENTRAL VENOUS HEMODIALYSIS CATHETER PLACEMENT WITH ULTRASOUND AND FLUOROSCOPIC GUIDANCE COMPARISON:  None Available. MEDICATIONS: None FLUOROSCOPY TIME:  Two mGy COMPLICATIONS: None immediate. PROCEDURE: Informed written consent was obtained from the patient after a discussion of the risks, benefits, and alternatives to treatment. Questions regarding the procedure were encouraged and answered. The right neck and chest were prepped with chlorhexidine in a sterile fashion, and a sterile drape was applied covering the operative field. Maximum barrier sterile technique with sterile gowns and gloves were used for the procedure. A timeout was performed prior to the initiation of the procedure. After the overlying soft tissues were anesthetized, a small venotomy incision was created and a micropuncture kit was utilized to access the internal jugular vein. Real-time ultrasound guidance was utilized for vascular access including the acquisition of a permanent ultrasound image documenting patency of the accessed vessel. The microwire was utilized to measure appropriate  catheter length. A Rosen wire was advanced to the level of the IVC. Under fluoroscopic guidance, the venotomy was serially dilated, ultimately allowing placement of a 20 cm temporary Trialysis catheter with tip ultimately terminating within the superior aspect of the right atrium. Final catheter positioning was confirmed and documented with a spot radiographic image. The catheter aspirates and flushes normally. The catheter was flushed with appropriate volume heparin dwells. The catheter exit site was secured with a 0 silk retention suture. A dressing was placed. The patient tolerated the procedure well without immediate post procedural complication. IMPRESSION: Successful placement of a right internal jugular approach 20 cm temporary dialysis catheter with tip terminating with in the superior aspect of the right atrium. The catheter is ready for immediate use. PLAN: This catheter may be converted to a tunneled dialysis catheter at a later date as indicated. Marliss Coots, MD [facility] Electronically Signed   By: Marliss Coots M.D.   On: 08/04/2023 21:06   CT ABDOMEN PELVIS WO CONTRAST Result Date: 08/03/2023 CLINICAL DATA:  Hematuria. EXAM: CT ABDOMEN AND PELVIS WITHOUT CONTRAST TECHNIQUE: Multidetector CT imaging of the abdomen and pelvis was performed following the standard protocol without IV contrast. RADIATION DOSE REDUCTION: This exam was performed according to the departmental dose-optimization program which includes automated exposure control, adjustment of the mA and/or kV according to patient size and/or use of iterative reconstruction technique. COMPARISON:  CT of the chest abdomen pelvis dated 07/21/2023. FINDINGS: Evaluation of this exam is limited in the absence of intravenous contrast. Lower chest: Small bilateral pleural effusions and bibasilar atelectasis. No intra-abdominal free air or free fluid. Hepatobiliary:  The liver is  unremarkable. No biliary dilatation. The gallbladder is unremarkable Pancreas: The pancreas is unremarkable as visualized. Spleen: Normal in size without focal abnormality. Adrenals/Urinary Tract: The adrenal glands are unremarkable. There is no hydronephrosis or nephrolithiasis on either side. Bilateral renal cysts and additional subcentimeter hypodense lesions which are not characterized on this CT. There is a 3 cm high attenuating lesion in the interpolar right kidney which is not characterized on this CT and may represent a proteinaceous or hemorrhagic cyst. A solid mass is not excluded further evaluation with ultrasound or MRI on a nonemergent/outpatient basis recommended. The visualized ureters appear unremarkable. The urinary bladder is decompressed around a Foley catheter. There is moderate amount of high attenuating content within the bladder consistent with blood products/clot. Stomach/Bowel: There is moderate sigmoid diverticulosis without active inflammatory changes. There is moderate stool throughout the colon. No bowel obstruction or active inflammation. The appendix is normal. Vascular/Lymphatic: Mild aortoiliac atherosclerotic disease. The IVC is unremarkable. No portal venous gas. There is no adenopathy. Reproductive: Mildly enlarged prostate gland measuring 5.3 cm in transverse axial diameter. The seminal vesicles are symmetric. Other: Bilateral anterior abdominal wall subcutaneous densities consistent with injections. Musculoskeletal: Degenerative changes of the spine and left hip. No acute osseous pathology. IMPRESSION: 1. No hydronephrosis or nephrolithiasis. 2. Moderate amount of blood products/clot within the urinary bladder. 3. A 3 cm indeterminate high attenuating lesion in the interpolar right kidney. Further evaluation with ultrasound or MRI on a nonemergent/outpatient basis recommended. 4. Moderate sigmoid diverticulosis. No bowel obstruction. Normal appendix. 5. Small bilateral pleural  effusions and bibasilar atelectasis. 6.  Aortic Atherosclerosis (ICD10-I70.0). Electronically Signed   By: Elgie Collard M.D.   On: 08/03/2023 17:25     Labs: BMET Recent Labs  Lab 07/30/23 0224 07/31/23 0308 08/01/23 0256 08/02/23 0257 08/03/23 0738 08/04/23 0244 08/05/23 0701  NA 133* 132* 132* 134* 132* 132* 131*  K 4.4 4.3 4.3 4.6 4.3 5.0 4.5  CL 87* 84* 84* 82* 81* 81* 88*  CO2 34* 36* 36* 37* 37* 35* 28  GLUCOSE 130* 138* 121* 124* 108* 114* 105*  BUN 162* 176* 186* 191* 199* 208* 147*  CREATININE 3.47* 3.33* 3.14* 3.42* 3.78* 4.40* 3.65*  CALCIUM 9.3 9.4 9.7 10.3 10.1 10.0 9.3  PHOS 5.2* 4.6 3.2 4.1  --   --   --    CBC Recent Labs  Lab 08/01/23 0256 08/03/23 1230 08/04/23 0244  WBC 7.5 7.9 7.4  HGB 8.1* 7.7* 7.4*  HCT 26.3* 24.5* 23.7*  MCV 96.0 93.9 94.8  PLT 151 181 176    Medications:     calcium acetate  667 mg Oral TID WC   Chlorhexidine Gluconate Cloth  6 each Topical Daily   darbepoetin (ARANESP) injection - DIALYSIS  60 mcg Subcutaneous Q Thu-1800   feeding supplement  237 mL Oral BID BM   finasteride  5 mg Oral Daily   guaiFENesin  600 mg Oral BID   polyethylene glycol  17 g Oral BID   rosuvastatin  10 mg Oral Daily   tamsulosin  0.4 mg Oral Daily   Arita Miss  08/05/2023, 9:54 AM

## 2023-08-05 NOTE — Plan of Care (Signed)
  Problem: Activity: Goal: Risk for activity intolerance will decrease Outcome: Not Progressing   Problem: Nutrition: Goal: Adequate nutrition will be maintained Outcome: Not Progressing   

## 2023-08-05 NOTE — Progress Notes (Signed)
Received patient in bed to unit.  Alert and oriented.  Informed consent signed and in chart.   TX duration:2 hours  Patient tolerated well.  Transported back to the room  Alert, without acute distress.  Hand-off given to patient's nurse.   Access used: catheter Access issues: none  Total UF removed: 300 ml Medication(s) given: none Post HD VS: 95/57 Post HD weight: unable to obtain.      08/05/23 0209  Vitals  Temp 98.7 F (37.1 C)  Temp Source Oral  BP (!) 95/57  MAP (mmHg) 69  BP Location Left Arm  BP Method Automatic  Patient Position (if appropriate) Lying  Pulse Rate (!) 59  Pulse Rate Source Monitor  ECG Heart Rate 62  Resp (!) 25  Oxygen Therapy  SpO2 100 %  O2 Device Nasal Cannula  O2 Flow Rate (L/min) 3 L/min  Patient Activity (if Appropriate) In bed  Pulse Oximetry Type Continuous  During Treatment Monitoring  Blood Flow Rate (mL/min) 0 mL/min  Arterial Pressure (mmHg) 24.24 mmHg  Venous Pressure (mmHg) -14.54 mmHg  TMP (mmHg) 32.72 mmHg  Ultrafiltration Rate (mL/min) 648 mL/min  Dialysate Flow Rate (mL/min) 300 ml/min  Dialysate Potassium Concentration 2  Dialysate Calcium Concentration 2.5  Duration of HD Treatment -hour(s) 2 hour(s)  Cumulative Fluid Removed (mL) per Treatment  250.13  Post Treatment  Dialyzer Clearance Lightly streaked  Hemodialysis Intake (mL) 0 mL  Liters Processed 27.9  Fluid Removed (mL) 300 mL  Tolerated HD Treatment Yes  Post-Hemodialysis Comments HD tx achieved as expected, tolerated well, no compaints.  pt is stable.  AVG/AVF Arterial Site Held (minutes) 0 minutes  AVG/AVF Venous Site Held (minutes) 0 minutes  Note  Patient Observations pt is bed resting, eyes closed.  Hemodialysis Catheter Right Internal jugular Triple lumen Temporary (Non-Tunneled)  Placement Date/Time: 08/04/23 1634   Serial / Lot #: [id number]  Expiration Date: 02/12/28  Time Out: Correct patient;Correct site;Correct procedure  Maximum sterile  barrier precautions: Hand hygiene;Cap;Mask;Sterile gown;Sterile gloves;Large sterile ...  Site Condition No complications  Blue Lumen Status Flushed;Heparin locked;Dead end cap in place  Red Lumen Status Flushed;Heparin locked;Dead end cap in place  Purple Lumen Status N/A  Catheter fill solution Heparin 1000 units/ml  Catheter fill volume (Arterial) 1.4 cc  Catheter fill volume (Venous) 1.4  Dressing Type Transparent  Dressing Status Antimicrobial disc in place  Drainage Description None  Post treatment catheter status Capped and Clamped

## 2023-08-05 NOTE — Progress Notes (Signed)
Physical Therapy Treatment Patient Details Name: RUXIN DASILVA MRN: [id number] DOB: 16-Aug-1951 Today's Date: 08/05/2023   History of Present Illness Patient is a 72 year old with acute on chronic diastolic CHF, Acute hypoxemic hyercapnic respiratory failure, acute cardiogenic pulmonary edema, left pleural effusion, metabolic encephalopathy. S/p left thoracentesis. CBI initiated on 12/18 for gross hematuria. S/p temp HD Rt IJ catheter placed 12/19.    PT Comments  Patient resting in bed at start of session and agreeable to mobilize with therapist; personal caregiver present in room. Pt required min assist for supine>sit and strong reliance on bed rails. Pt required significant extra rest breaks throughout but with time able to complete 1x sit<>stand from EOB. Pt c/o dizziness on standing and mod assist to initiate return to sit. BP assessed and stable. HR and SpO2 stable. Pt reports symptoms subsided with seated rest. Repositioned at EOB for comfort as pt declined transfer to recliner or return to supine. RN aware or pt position and caregiver at bedside. Will continue to progress pt as able during stay.    If plan is discharge home, recommend the following: A little help with walking and/or transfers;A little help with bathing/dressing/bathroom;Assist for transportation;Help with stairs or ramp for entrance;Assistance with cooking/housework   Can travel by private vehicle        Equipment Recommendations  Hospital bed;BSC/3in1 (depending on progress)    Recommendations for Other Services       Precautions / Restrictions Precautions Precautions: Fall Restrictions Weight Bearing Restrictions Per Provider Order: No     Mobility  Bed Mobility Overal bed mobility: Needs Assistance Bed Mobility: Supine to Sit     Supine to sit: Min assist, Used rails     General bed mobility comments: pt reliant on bed features and taking significant extra time. min assist needed to fully raise  trunk upright.    Transfers Overall transfer level: Needs assistance Equipment used: Rolling walker (2 wheels) Transfers: Sit to/from Stand Sit to Stand: Min assist           General transfer comment: RW provided and min assist for power up, pt initiated rise, assist to stabilize walker and steady balance provided.    Ambulation/Gait                   Stairs             Wheelchair Mobility     Tilt Bed    Modified Rankin (Stroke Patients Only)       Balance Overall balance assessment: Needs assistance Sitting-balance support: Feet supported Sitting balance-Leahy Scale: Fair Sitting balance - Comments: posture flexed   Standing balance support: Bilateral upper extremity supported, During functional activity, Reliant on assistive device for balance Standing balance-Leahy Scale: Poor Standing balance comment: using rolling walker for support in standing                            Cognition Arousal: Alert Behavior During Therapy: WFL for tasks assessed/performed, Flat affect Overall Cognitive Status: Impaired/Different from baseline                                 General Comments: pt with decreased insight to defictis and safety. slow to process and initiate mobility.        Exercises      General Comments        Pertinent Vitals/Pain  Pain Assessment Pain Assessment: No/denies pain    Home Living                          Prior Function            PT Goals (current goals can now be found in the care plan section) Acute Rehab PT Goals Patient Stated Goal: family goal to return home with family support and HHPT PT Goal Formulation: With patient Time For Goal Achievement: 08/12/23 Potential to Achieve Goals: Good Progress towards PT goals: Progressing toward goals    Frequency    Min 1X/week      PT Plan      Co-evaluation              AM-PAC PT "6 Clicks" Mobility   Outcome  Measure  Help needed turning from your back to your side while in a flat bed without using bedrails?: A Little Help needed moving from lying on your back to sitting on the side of a flat bed without using bedrails?: A Little Help needed moving to and from a bed to a chair (including a wheelchair)?: A Little Help needed standing up from a chair using your arms (e.g., wheelchair or bedside chair)?: A Little Help needed to walk in hospital room?: A Lot Help needed climbing 3-5 steps with a railing? : Total 6 Click Score: 15    End of Session Equipment Utilized During Treatment: Oxygen Activity Tolerance: Patient tolerated treatment well Patient left: in bed;with family/visitor present;with call bell/phone within reach Nurse Communication: Mobility status PT Visit Diagnosis: Unsteadiness on feet (R26.81);Muscle weakness (generalized) (M62.81)     Time: 1610-9604 PT Time Calculation (min) (ACUTE ONLY): 52 min  Charges:    $Therapeutic Activity: 38-52 mins PT General Charges $$ ACUTE PT VISIT: 1 Visit                     Wynn Maudlin, DPT Acute Rehabilitation Services Office [phone]  08/05/23 1:38 PM

## 2023-08-05 NOTE — Progress Notes (Signed)
   08/05/23 0039  BiPAP/CPAP/SIPAP  Reason BIPAP/CPAP not in use  (not in room)  BiPAP/CPAP /SiPAP Vitals  Pulse Rate 67  Resp 18  BP (!) 83/53  SpO2 100 %  MEWS Score/Color  MEWS Score 1  MEWS Score Color Andrew Schmitt

## 2023-08-05 NOTE — Progress Notes (Signed)
PROGRESS NOTE    Andrew Schmitt  MVH:[id number] DOB: 1950-12-06 DOA: 07/21/2023 PCP: Dorothyann Peng, MD  [name] with chronic diastolic CHF, hypertension, dyslipidemia, ascending aortic aneurysm, BPH, CKD 3b/4, chronic anemia, gout, obesity presented to the ED with progressive weakness and dyspnea x7d. In the ED,  lethargic, hypoxic, bradycardic, placed on NRM   Labs-Na 135,bun 63, cr 3,18 , BNP 183, troponin 337, 278, 309 CXR wbilateral hilar vascular congestion, bilateral pleural effusions, moderate to large on Left. CT chest, A&P: -moderate left pleural effusion with compressive atelectasis, Mild diffuse body wall edema.  -12/6: started on diuretics, Coude catheter placed for hematuria w/ CLots and increased discomfort before overt retention -12/7 more encephalopathic, placed on BiPAP, very mild respiratory acidosis, pCO2 in the 60-70 range -Complicated by worsening AKI, nephrology consulting, diuretic dose increased 12/14 persistent elevation in BUN.  12/15 resumed diuretic therapy  12/16 transitioned to oral loop diuretic therapy.  12/17 holding diuretic therapy, persistent severe BUN elevation 12/18: Gross hematuria with clots after tugging at catheter same morning, Urology consulted, started on continuous bladder irrigation, Foley replaced -12/19, BUN 200 with uremia, temp HD catheter placed,  Subjective: No new symptoms, started on CBI yesterday  Assessment and Plan:  Acute on chronic diastolic CHF Left Pleural effusion Last echo 6/24 with EF 60-65%, moderate LVH, grade 2 DD, mildly reduced RV -Pulmonary c/s:: L Thoracentesis 12/10: exudative, cytology negative -Diuresed on IV Lasix, cards and nephrology following, down 30 LB, 21.7 L negative -Repeat echo with EF 65-70% moderate LVH, indeterminate diastolic parameters, normal RV -BUN/creatinine continues to worsen, appreciate nephrology input, diuretics have been on hold -GDMT limited by AKI/CKD -HD catheter placed 12/19,  started HD  Acute hypoxemic hyercapnic respiratory failure, due to acute cardiogenic pulmonary edema, left pleural effusion, complicated with acute metabolic encephalopathy.  12/10 SP left thoracentesis 1100 ml drained -Monitor x-rays periodically  AKI on CKD stage 3b. Hyponatremia,. ESRD -cardiorenal,  baseline creatinine of 2-2.5  -Foley catheter placed 12/6 for hematuria with clots, ? early retention, has known BPH, CT on admission with bilateral renal cysts as well -Creatinine worsening, per nephrology, diuretics held now -BUN over 200 yesterday with uremia, nephrology following, temporary dialysis catheter placed 12/19, started HD 12/19  -Second dialysis tomorrow  Gross hematuria Traumatic Foley dislodgement BPH (benign prostatic hyperplasia) Right kidney lesion noted on CT Continue with flomax and proscar  -Foley catheter/bulb was dislodged after tugging yesterday morning, this was replaced, appreciate urology input, continue CBI, urine appears to be clearing -Eventually needs workup, renal lesion noted on imaging  Anemia of chronic renal disease stable  Second degree AV block, Mobitz type I Continue to hold on AV blockers.  Obesity, class 2 Calculated BMI is 37,4  DVT prophylaxis: Hep SQ-> held 12/18 Code Status: Full Code Family Communication: Wife at bedside Disposition Plan: To be determined  Consultants: Nephrology, cards, IR   Procedures: 12/19, temp dialysis catheter placement  Antimicrobials:    Objective: Vitals:   08/05/23 0436 08/05/23 0558 08/05/23 0810 08/05/23 1112  BP: 112/66  117/68 102/69  Pulse: (!) 59  66 66  Resp: 20  20 20   Temp: 98.3 F (36.8 C)  97.7 F (36.5 C) 98.6 F (37 C)  TempSrc: Oral  Oral Oral  SpO2: 100%  100% 100%  Weight:  98 kg    Height:        Intake/Output Summary (Last 24 hours) at 08/05/2023 1137 Last data filed at 08/05/2023 0914 Gross per 24 hour  Intake 3300 ml  Output 7750 ml  Net -4450 ml   Filed  Weights   08/03/23 0312 08/04/23 0443 08/05/23 0558  Weight: 101.2 kg 103.3 kg 98 kg    Examination:  Gen: Frail elderly male laying in bed, AAO x 2, less somnolent, asterixis improving HEENT: No JVD, right IJ HD catheter CVS: S1-S2, regular rhythm Lungs: Diminished breath sounds bilaterally Abdomen: Soft, nontender, bowel sounds present Extremities: Trace edema  GU: Foley catheter with CBI    Data Reviewed:   CBC: Recent Labs  Lab 08/01/23 0256 08/03/23 1230 08/04/23 0244 08/05/23 0939  WBC 7.5 7.9 7.4 8.0  HGB 8.1* 7.7* 7.4* 7.3*  HCT 26.3* 24.5* 23.7* 24.3*  MCV 96.0 93.9 94.8 98.8  PLT 151 181 176 157   Basic Metabolic Panel: Recent Labs  Lab 07/30/23 0224 07/31/23 0308 08/01/23 0256 08/02/23 0257 08/03/23 0738 08/04/23 0244 08/05/23 0701  NA 133* 132* 132* 134* 132* 132* 131*  K 4.4 4.3 4.3 4.6 4.3 5.0 4.5  CL 87* 84* 84* 82* 81* 81* 88*  CO2 34* 36* 36* 37* 37* 35* 28  GLUCOSE 130* 138* 121* 124* 108* 114* 105*  BUN 162* 176* 186* 191* 199* 208* 147*  CREATININE 3.47* 3.33* 3.14* 3.42* 3.78* 4.40* 3.65*  CALCIUM 9.3 9.4 9.7 10.3 10.1 10.0 9.3  MG  --   --   --   --  2.6*  --   --   PHOS 5.2* 4.6 3.2 4.1  --   --   --    GFR: Estimated Creatinine Clearance: 19.5 mL/min (A) (by C-G formula based on SCr of 3.65 mg/dL (H)). Liver Function Tests: Recent Labs  Lab 07/30/23 0224 07/31/23 0308 08/01/23 0256 08/02/23 0257  ALBUMIN 3.1* 3.1* 3.3* 3.5   No results for input(s): "LIPASE", "AMYLASE" in the last 168 hours. No results for input(s): "AMMONIA" in the last 168 hours. Coagulation Profile: No results for input(s): "INR", "PROTIME" in the last 168 hours. Cardiac Enzymes: No results for input(s): "CKTOTAL", "CKMB", "CKMBINDEX", "TROPONINI" in the last 168 hours. BNP (last 3 results) No results for input(s): "PROBNP" in the last 8760 hours. HbA1C: No results for input(s): "HGBA1C" in the last 72 hours. CBG: Recent Labs  Lab 07/29/23 2111   GLUCAP 145*   Lipid Profile: No results for input(s): "CHOL", "HDL", "LDLCALC", "TRIG", "CHOLHDL", "LDLDIRECT" in the last 72 hours. Thyroid Function Tests: No results for input(s): "TSH", "T4TOTAL", "FREET4", "T3FREE", "THYROIDAB" in the last 72 hours. Anemia Panel: No results for input(s): "VITAMINB12", "FOLATE", "FERRITIN", "TIBC", "IRON", "RETICCTPCT" in the last 72 hours. Urine analysis:    Component Value Date/Time   COLORURINE AMBER (A) 07/29/2023 1033   APPEARANCEUR CLOUDY (A) 07/29/2023 1033   LABSPEC 1.013 07/29/2023 1033   PHURINE 5.0 07/29/2023 1033   GLUCOSEU NEGATIVE 07/29/2023 1033   HGBUR LARGE (A) 07/29/2023 1033   BILIRUBINUR NEGATIVE 07/29/2023 1033   BILIRUBINUR negative 04/09/2021 0954   KETONESUR NEGATIVE 07/29/2023 1033   PROTEINUR 100 (A) 07/29/2023 1033   UROBILINOGEN 0.2 04/09/2021 0954   NITRITE NEGATIVE 07/29/2023 1033   LEUKOCYTESUR NEGATIVE 07/29/2023 1033   Sepsis Labs: @LABRCNTIP (procalcitonin:4,lacticidven:4)  ) Recent Results (from the past 240 hours)  Pleural fluid culture w Gram Stain     Status: None   Collection Time: 07/26/23  1:38 PM   Specimen: Pleural Fluid  Result Value Ref Range Status   Specimen Description PLEURAL  Final   Special Requests NONE  Final   Gram Stain   Final    FEW  WBC PRESENT, PREDOMINANTLY MONONUCLEAR NO ORGANISMS SEEN    Culture   Final    NO GROWTH 3 DAYS Performed at [facility], [address], [place], [place] [address]    Report Status 07/29/2023 FINAL  Final     Radiology Studies: IR Fluoro Guide CV Line Right Result Date: 08/04/2023 INDICATION: 72 year old male with worsening end-stage renal disease requiring initiation of hemodialysis. EXAM: NON-TUNNELED CENTRAL VENOUS HEMODIALYSIS CATHETER PLACEMENT WITH ULTRASOUND AND FLUOROSCOPIC GUIDANCE COMPARISON:  None Available. MEDICATIONS: None FLUOROSCOPY TIME:  Two mGy COMPLICATIONS: None immediate. PROCEDURE: Informed written consent was  obtained from the patient after a discussion of the risks, benefits, and alternatives to treatment. Questions regarding the procedure were encouraged and answered. The right neck and chest were prepped with chlorhexidine in a sterile fashion, and a sterile drape was applied covering the operative field. Maximum barrier sterile technique with sterile gowns and gloves were used for the procedure. A timeout was performed prior to the initiation of the procedure. After the overlying soft tissues were anesthetized, a small venotomy incision was created and a micropuncture kit was utilized to access the internal jugular vein. Real-time ultrasound guidance was utilized for vascular access including the acquisition of a permanent ultrasound image documenting patency of the accessed vessel. The microwire was utilized to measure appropriate catheter length. A Rosen wire was advanced to the level of the IVC. Under fluoroscopic guidance, the venotomy was serially dilated, ultimately allowing placement of a 20 cm temporary Trialysis catheter with tip ultimately terminating within the superior aspect of the right atrium. Final catheter positioning was confirmed and documented with a spot radiographic image. The catheter aspirates and flushes normally. The catheter was flushed with appropriate volume heparin dwells. The catheter exit site was secured with a 0 silk retention suture. A dressing was placed. The patient tolerated the procedure well without immediate post procedural complication. IMPRESSION: Successful placement of a right internal jugular approach 20 cm temporary dialysis catheter with tip terminating with in the superior aspect of the right atrium. The catheter is ready for immediate use. PLAN: This catheter may be converted to a tunneled dialysis catheter at a later date as indicated. Marliss Coots, MD [facility] Electronically Signed   By: Marliss Coots  M.D.   On: 08/04/2023 21:06   IR US Guide Vasc Access Right Result Date: 08/04/2023 INDICATION: 72 year old male with worsening end-stage renal disease requiring initiation of hemodialysis. EXAM: NON-TUNNELED CENTRAL VENOUS HEMODIALYSIS CATHETER PLACEMENT WITH ULTRASOUND AND FLUOROSCOPIC GUIDANCE COMPARISON:  None Available. MEDICATIONS: None FLUOROSCOPY TIME:  Two mGy COMPLICATIONS: None immediate. PROCEDURE: Informed written consent was obtained from the patient after a discussion of the risks, benefits, and alternatives to treatment. Questions regarding the procedure were encouraged and answered. The right neck and chest were prepped with chlorhexidine in a sterile fashion, and a sterile drape was applied covering the operative field. Maximum barrier sterile technique with sterile gowns and gloves were used for the procedure. A timeout was performed prior to the initiation of the procedure. After the overlying soft tissues were anesthetized, a small venotomy incision was created and a micropuncture kit was utilized to access the internal jugular vein. Real-time ultrasound guidance was utilized for vascular access including the acquisition of a permanent ultrasound image documenting patency of the accessed vessel. The microwire was utilized to measure appropriate catheter length. A Rosen wire was advanced to the level of the IVC. Under fluoroscopic guidance, the venotomy was serially dilated, ultimately  allowing placement of a 20 cm temporary Trialysis catheter with tip ultimately terminating within the superior aspect of the right atrium. Final catheter positioning was confirmed and documented with a spot radiographic image. The catheter aspirates and flushes normally. The catheter was flushed with appropriate volume heparin dwells. The catheter exit site was secured with a 0 silk retention suture. A dressing was placed. The patient tolerated the procedure well without immediate post procedural complication.  IMPRESSION: Successful placement of a right internal jugular approach 20 cm temporary dialysis catheter with tip terminating with in the superior aspect of the right atrium. The catheter is ready for immediate use. PLAN: This catheter may be converted to a tunneled dialysis catheter at a later date as indicated. Marliss Coots, MD [facility] Electronically Signed   By: Marliss Coots M.D.   On: 08/04/2023 21:06   CT ABDOMEN PELVIS WO CONTRAST Result Date: 08/03/2023 CLINICAL DATA:  Hematuria. EXAM: CT ABDOMEN AND PELVIS WITHOUT CONTRAST TECHNIQUE: Multidetector CT imaging of the abdomen and pelvis was performed following the standard protocol without IV contrast. RADIATION DOSE REDUCTION: This exam was performed according to the departmental dose-optimization program which includes automated exposure control, adjustment of the mA and/or kV according to patient size and/or use of iterative reconstruction technique. COMPARISON:  CT of the chest abdomen pelvis dated 07/21/2023. FINDINGS: Evaluation of this exam is limited in the absence of intravenous contrast. Lower chest: Small bilateral pleural effusions and bibasilar atelectasis. No intra-abdominal free air or free fluid. Hepatobiliary: The liver is unremarkable. No biliary dilatation. The gallbladder is unremarkable Pancreas: The pancreas is unremarkable as visualized. Spleen: Normal in size without focal abnormality. Adrenals/Urinary Tract: The adrenal glands are unremarkable. There is no hydronephrosis or nephrolithiasis on either side. Bilateral renal cysts and additional subcentimeter hypodense lesions which are not characterized on this CT. There is a 3 cm high attenuating lesion in the interpolar right kidney which is not characterized on this CT and may represent a proteinaceous or hemorrhagic cyst. A solid mass is not excluded further evaluation with ultrasound or MRI on a nonemergent/outpatient  basis recommended. The visualized ureters appear unremarkable. The urinary bladder is decompressed around a Foley catheter. There is moderate amount of high attenuating content within the bladder consistent with blood products/clot. Stomach/Bowel: There is moderate sigmoid diverticulosis without active inflammatory changes. There is moderate stool throughout the colon. No bowel obstruction or active inflammation. The appendix is normal. Vascular/Lymphatic: Mild aortoiliac atherosclerotic disease. The IVC is unremarkable. No portal venous gas. There is no adenopathy. Reproductive: Mildly enlarged prostate gland measuring 5.3 cm in transverse axial diameter. The seminal vesicles are symmetric. Other: Bilateral anterior abdominal wall subcutaneous densities consistent with injections. Musculoskeletal: Degenerative changes of the spine and left hip. No acute osseous pathology. IMPRESSION: 1. No hydronephrosis or nephrolithiasis. 2. Moderate amount of blood products/clot within the urinary bladder. 3. A 3 cm indeterminate high attenuating lesion in the interpolar right kidney. Further evaluation with ultrasound or MRI on a nonemergent/outpatient basis recommended. 4. Moderate sigmoid diverticulosis. No bowel obstruction. Normal appendix. 5. Small bilateral pleural effusions and bibasilar atelectasis. 6.  Aortic Atherosclerosis (ICD10-I70.0). Electronically Signed   By: Elgie Collard M.D.   On: 08/03/2023 17:25     Scheduled Meds:  calcium acetate  667 mg Oral TID WC   Chlorhexidine Gluconate Cloth  6 each Topical Daily   darbepoetin (ARANESP) injection - DIALYSIS  60 mcg Subcutaneous Q Thu-1800   feeding supplement  237 mL Oral  BID BM   finasteride  5 mg Oral Daily   guaiFENesin  600 mg Oral BID   polyethylene glycol  17 g Oral BID   rosuvastatin  10 mg Oral Daily   tamsulosin  0.4 mg Oral Daily   Continuous Infusions:  sodium chloride irrigation       LOS: 14 days    Time spent:     Zannie Cove, MD [facility]   08/05/2023, 11:37 AM

## 2023-08-05 NOTE — Progress Notes (Addendum)
10 Days Post-Op Subjective: Patient and his wife are sleeping on my arrival.  CBI was clamped this morning and urine remained clear midday.  Objective: Vital signs in last 24 hours: Temp:  [97.7 F (36.5 C)-98.7 F (37.1 C)] 98.6 F (37 C) (12/20 1112) Pulse Rate:  [58-77] 66 (12/20 1112) Resp:  [15-26] 20 (12/20 1112) BP: (83-117)/(48-70) 102/69 (12/20 1112) SpO2:  [100 %] 100 % (12/20 1112) Weight:  [98 kg] 98 kg (12/20 0558)  Assessment/Plan: # Hematuria # Traumatic Foley removal # Uremic bleeding # Clot obstruction of urine   Over wire placement of council hematuria catheter fashioned from a 45f Rusch.    Only around 50cc of clot material hand irrigated.  Catheter is somewhat positional and nearly hubbed despite a calculated prostate size around 60 to 75g.  CT hematuria collected which does reveal some residual clot burden which I was unable to evacuate with hand irrigation.  Hopefully this will liquefy on its own.  CBI clamped as of the morning of 12/20.  Voiding trial considered once uremia is corrected.   Follow up w/ Dr. Alvester Morin 1-2 weeks after discharge   We will follow along with you.  Intake/Output from previous day: 12/19 0701 - 12/20 0700 In: 5340 [P.O.:240] Out: 8350 [Urine:8050]  Intake/Output this shift: Total I/O In: 60 [P.O.:60] Out: 700 [Urine:700]  Physical Exam:  General: Alert and oriented CV: No cyanosis Lungs: equal chest rise Abdomen: Soft, NTND, no rebound or guarding Gu: CBI off.  Clear urine with minimal amount of rusty sediment.  Lab Results: Recent Labs    08/03/23 1230 08/04/23 0244 08/05/23 0939  HGB 7.7* 7.4* 7.3*  HCT 24.5* 23.7* 24.3*   BMET Recent Labs    08/04/23 0244 08/05/23 0701  NA 132* 131*  K 5.0 4.5  CL 81* 88*  CO2 35* 28  GLUCOSE 114* 105*  BUN 208* 147*  CREATININE 4.40* 3.65*  CALCIUM 10.0 9.3     Studies/Results: IR Fluoro Guide CV Line Right Result Date: 08/04/2023 INDICATION: 72 year old  male with worsening end-stage renal disease requiring initiation of hemodialysis. EXAM: NON-TUNNELED CENTRAL VENOUS HEMODIALYSIS CATHETER PLACEMENT WITH ULTRASOUND AND FLUOROSCOPIC GUIDANCE COMPARISON:  None Available. MEDICATIONS: None FLUOROSCOPY TIME:  Two mGy COMPLICATIONS: None immediate. PROCEDURE: Informed written consent was obtained from the patient after a discussion of the risks, benefits, and alternatives to treatment. Questions regarding the procedure were encouraged and answered. The right neck and chest were prepped with chlorhexidine in a sterile fashion, and a sterile drape was applied covering the operative field. Maximum barrier sterile technique with sterile gowns and gloves were used for the procedure. A timeout was performed prior to the initiation of the procedure. After the overlying soft tissues were anesthetized, a small venotomy incision was created and a micropuncture kit was utilized to access the internal jugular vein. Real-time ultrasound guidance was utilized for vascular access including the acquisition of a permanent ultrasound image documenting patency of the accessed vessel. The microwire was utilized to measure appropriate catheter length. A Rosen wire was advanced to the level of the IVC. Under fluoroscopic guidance, the venotomy was serially dilated, ultimately allowing placement of a 20 cm temporary Trialysis catheter with tip ultimately terminating within the superior aspect of the right atrium. Final catheter positioning was confirmed and documented with a spot radiographic image. The catheter aspirates and flushes normally. The catheter was flushed with appropriate volume heparin dwells. The catheter exit site was secured with a 0 silk retention suture. A  dressing was placed. The patient tolerated the procedure well without immediate post procedural complication. IMPRESSION: Successful placement of a right internal jugular approach 20 cm temporary dialysis catheter with tip  terminating with in the superior aspect of the right atrium. The catheter is ready for immediate use. PLAN: This catheter may be converted to a tunneled dialysis catheter at a later date as indicated. Marliss Coots, MD [facility] Electronically Signed   By: Marliss Coots M.D.   On: 08/04/2023 21:06   IR US Guide Vasc Access Right Result Date: 08/04/2023 INDICATION: 72 year old male with worsening end-stage renal disease requiring initiation of hemodialysis. EXAM: NON-TUNNELED CENTRAL VENOUS HEMODIALYSIS CATHETER PLACEMENT WITH ULTRASOUND AND FLUOROSCOPIC GUIDANCE COMPARISON:  None Available. MEDICATIONS: None FLUOROSCOPY TIME:  Two mGy COMPLICATIONS: None immediate. PROCEDURE: Informed written consent was obtained from the patient after a discussion of the risks, benefits, and alternatives to treatment. Questions regarding the procedure were encouraged and answered. The right neck and chest were prepped with chlorhexidine in a sterile fashion, and a sterile drape was applied covering the operative field. Maximum barrier sterile technique with sterile gowns and gloves were used for the procedure. A timeout was performed prior to the initiation of the procedure. After the overlying soft tissues were anesthetized, a small venotomy incision was created and a micropuncture kit was utilized to access the internal jugular vein. Real-time ultrasound guidance was utilized for vascular access including the acquisition of a permanent ultrasound image documenting patency of the accessed vessel. The microwire was utilized to measure appropriate catheter length. A Rosen wire was advanced to the level of the IVC. Under fluoroscopic guidance, the venotomy was serially dilated, ultimately allowing placement of a 20 cm temporary Trialysis catheter with tip ultimately terminating within the superior aspect of the right atrium. Final catheter positioning was confirmed and  documented with a spot radiographic image. The catheter aspirates and flushes normally. The catheter was flushed with appropriate volume heparin dwells. The catheter exit site was secured with a 0 silk retention suture. A dressing was placed. The patient tolerated the procedure well without immediate post procedural complication. IMPRESSION: Successful placement of a right internal jugular approach 20 cm temporary dialysis catheter with tip terminating with in the superior aspect of the right atrium. The catheter is ready for immediate use. PLAN: This catheter may be converted to a tunneled dialysis catheter at a later date as indicated. Marliss Coots, MD [facility] Electronically Signed   By: Marliss Coots M.D.   On: 08/04/2023 21:06      LOS: 14 days   Elmon Kirschner, NP [facility] Pager: [phone]  08/05/2023, 2:13 PM

## 2023-08-06 DIAGNOSIS — I5033 Acute on chronic diastolic (congestive) heart failure: Secondary | ICD-10-CM | POA: Diagnosis not present

## 2023-08-06 LAB — BASIC METABOLIC PANEL
Anion gap: 9 (ref 5–15)
BUN: 158 mg/dL — ABNORMAL HIGH (ref 8–23)
CO2: 31 mmol/L (ref 22–32)
Calcium: 9 mg/dL (ref 8.9–10.3)
Chloride: 91 mmol/L — ABNORMAL LOW (ref 98–111)
Creatinine, Ser: 4.01 mg/dL — ABNORMAL HIGH (ref 0.61–1.24)
GFR, Estimated: 15 mL/min — ABNORMAL LOW (ref >60)
Glucose, Bld: 105 mg/dL — ABNORMAL HIGH (ref 70–99)
Potassium: 4.3 mmol/L (ref 3.5–5.1)
Sodium: 131 mmol/L — ABNORMAL LOW (ref 135–145)

## 2023-08-06 NOTE — Progress Notes (Signed)
   08/06/23 2042  BiPAP/CPAP/SIPAP  $ Non-Invasive Home Ventilator  Subsequent  BiPAP/CPAP/SIPAP Pt Type Adult  BiPAP/CPAP/SIPAP V60  Mask Type Full face mask  Mask Size Extra large  Set Rate 20 breaths/min  Respiratory Rate 24 breaths/min  IPAP 20 cmH20  EPAP 8 cmH2O  FiO2 (%) 35 %  Minute Ventilation 18.4  Leak 29  Peak Inspiratory Pressure (PIP) 21  Tidal Volume (Vt) 815  Patient Home Equipment No  Auto Titrate No  Press High Alarm 25 cmH2O  Press Low Alarm 5 cmH2O

## 2023-08-06 NOTE — Progress Notes (Signed)
While getting this patient off of the bed pan, the patient's care giver demanded that pt be bathed since he was scheduled for dialysis.  This Clinical research associate and the nurse tech assigned to this pt proceeded to provide personal care and patient's personal care giver stood at the end of the bed directing both this Clinical research associate and the nurse tech how to wash the patient. This Clinical research associate assured the care giver that the patient would be taken care as this Clinical research associate has 25 years of nursing experience and not to worry. Pt  care giver continued to interfere in pt care and the pt even told her to calm down and that he was pleased with the way this Clinical research associate and the nurse tech were handling him as care was provided at the patient's pace. Pt care giver became more irate and continued to insult the way that the nurse tech was providing care and demanded to speak to the charge nurse. The patient's care was provided according to [facility]'s values and the patient continued to thank this Clinical research associate and the nurse tech for their patience and care.

## 2023-08-06 NOTE — Progress Notes (Addendum)
PROGRESS NOTE    Andrew Schmitt  CZY:[id number] DOB: Feb 23, 1951 DOA: 07/21/2023 PCP: Dorothyann Peng, MD  [name] with chronic diastolic CHF, hypertension, dyslipidemia, ascending aortic aneurysm, BPH, CKD 3b/4, chronic anemia, gout, obesity presented to the ED with progressive weakness and dyspnea x7d. In the ED,  lethargic, hypoxic, bradycardic, placed on NRM   Labs-Na 135,bun 63, cr 3,18 , BNP 183, troponin 337, 278, 309 CXR wbilateral hilar vascular congestion, bilateral pleural effusions, moderate to large on Left. CT chest, A&P: -moderate left pleural effusion with compressive atelectasis, Mild diffuse body wall edema.  -12/6: started on diuretics, Coude catheter placed for hematuria w/ CLots and increased discomfort before overt retention -12/7 more encephalopathic, placed on BiPAP, very mild respiratory acidosis, pCO2 in the 60-70 range -Complicated by worsening AKI, nephrology consulting, diuretic dose increased 12/14 persistent elevation in BUN.  12/15 resumed diuretic therapy  12/16 transitioned to oral loop diuretic therapy.  12/17 holding diuretic therapy, persistent severe BUN elevation 12/18: Gross hematuria with clots after tugging at catheter same morning, Urology consulted, started on continuous bladder irrigation, Foley replaced -12/19, BUN 200 with uremia, temp HD catheter placed, -12/21, CBI clamped  Subjective: No complaints, seen on dialysis  Assessment and Plan:  Acute on chronic diastolic CHF Left Pleural effusion Last echo 6/24 with EF 60-65%, moderate LVH, grade 2 DD, mildly reduced RV -Pulmonary c/s:: L Thoracentesis 12/10: exudative, cytology negative -Diuresed on IV Lasix, cards and nephrology following, down 30 LB, 21.7 L negative -Repeat echo with EF 65-70% moderate LVH, indeterminate diastolic parameters, normal RV -BUN/creatinine continued to worsen, appreciate nephrology input, diuretics have been on hold -GDMT limited by AKI/CKD -HD catheter placed  12/19, started HD, second dialysis today -Crease activity, PT eval  Acute hypoxemic hyercapnic respiratory failure, due to acute cardiogenic pulmonary edema, left pleural effusion, complicated with acute metabolic encephalopathy.  12/10 SP left thoracentesis 1100 ml drained -Monitor x-rays periodically  AKI on CKD stage 3b. Hyponatremia,. ESRD -cardiorenal,  baseline creatinine of 2-2.5  -Foley catheter placed 12/6 for hematuria with clots, ? early retention, has known BPH, CT on admission with bilateral renal cysts as well -Creatinine worsening, per nephrology, diuretics held now -BUN over 200 with uremia, nephrology following, temporary dialysis catheter placed 12/19, started HD 12/19  -Second dialysis today -Plan for [facility] early next week  Gross hematuria Traumatic Foley dislodgement BPH (benign prostatic hyperplasia) Right kidney lesion noted on CT Continue with flomax and proscar  -Foley catheter/bulb was dislodged after tugging yesterday morning, this was replaced, appreciate urology input, started CBI 12/18, CBI clamped 12/20, urology following -Eventually needs workup, renal lesion noted on imaging  Anemia of chronic renal disease -Hemoglobin continues to trend down, check anemia panel with a.m. labs  Second degree AV block, Mobitz type I Continue to hold on AV blockers.  Obesity, class 2 Calculated BMI is 37,4  DVT prophylaxis: Hep SQ-> held 12/18 Code Status: Full Code Family Communication: d/w daughter Disposition Plan: To be determined  Consultants: Nephrology, cards, IR   Procedures: 12/19, temp dialysis catheter placement  Antimicrobials:    Objective: Vitals:   08/06/23 1015 08/06/23 1030 08/06/23 1100 08/06/23 1130  BP: (!) 101/55 111/62 109/60 107/64  Pulse: 75 (!) 55 64 62  Resp: 18 16 (!) 22 (!) 23  Temp:      TempSrc:      SpO2: 100% 100% 100% 100%  Weight:      Height:        Intake/Output Summary (Last 24 hours) at  08/06/2023 1151 Last  data filed at 08/05/2023 1500 Gross per 24 hour  Intake --  Output 700 ml  Net -700 ml   Filed Weights   08/04/23 0443 08/05/23 0558 08/06/23 0915  Weight: 103.3 kg 98 kg 97.9 kg    Examination:  Gen: Frail elderly male seen on dialysis, sleeping, arousable, oriented to self and place  HEENT: No JVD, right IJ HD catheter CVS: S1-S2, regular rhythm Lungs: Diminished breath sounds bilaterally  Abdomen: Soft, nontender, bowel sounds present Extremities: Trace edema  GU: Foley catheter with CBI    Data Reviewed:   CBC: Recent Labs  Lab 08/01/23 0256 08/03/23 1230 08/04/23 0244 08/05/23 0939  WBC 7.5 7.9 7.4 8.0  HGB 8.1* 7.7* 7.4* 7.3*  HCT 26.3* 24.5* 23.7* 24.3*  MCV 96.0 93.9 94.8 98.8  PLT 151 181 176 157   Basic Metabolic Panel: Recent Labs  Lab 07/31/23 0308 08/01/23 0256 08/02/23 0257 08/03/23 0738 08/04/23 0244 08/05/23 0701 08/06/23 0236  NA 132* 132* 134* 132* 132* 131* 131*  K 4.3 4.3 4.6 4.3 5.0 4.5 4.3  CL 84* 84* 82* 81* 81* 88* 91*  CO2 36* 36* 37* 37* 35* 28 31  GLUCOSE 138* 121* 124* 108* 114* 105* 105*  BUN 176* 186* 191* 199* 208* 147* 158*  CREATININE 3.33* 3.14* 3.42* 3.78* 4.40* 3.65* 4.01*  CALCIUM 9.4 9.7 10.3 10.1 10.0 9.3 9.0  MG  --   --   --  2.6*  --   --   --   PHOS 4.6 3.2 4.1  --   --   --   --    GFR: Estimated Creatinine Clearance: 17.7 mL/min (A) (by C-G formula based on SCr of 4.01 mg/dL (H)). Liver Function Tests: Recent Labs  Lab 07/31/23 0308 08/01/23 0256 08/02/23 0257  ALBUMIN 3.1* 3.3* 3.5   No results for input(s): "LIPASE", "AMYLASE" in the last 168 hours. No results for input(s): "AMMONIA" in the last 168 hours. Coagulation Profile: No results for input(s): "INR", "PROTIME" in the last 168 hours. Cardiac Enzymes: No results for input(s): "CKTOTAL", "CKMB", "CKMBINDEX", "TROPONINI" in the last 168 hours. BNP (last 3 results) No results for input(s): "PROBNP" in the last 8760 hours. HbA1C: No results  for input(s): "HGBA1C" in the last 72 hours. CBG: No results for input(s): "GLUCAP" in the last 168 hours.  Lipid Profile: No results for input(s): "CHOL", "HDL", "LDLCALC", "TRIG", "CHOLHDL", "LDLDIRECT" in the last 72 hours. Thyroid Function Tests: No results for input(s): "TSH", "T4TOTAL", "FREET4", "T3FREE", "THYROIDAB" in the last 72 hours. Anemia Panel: No results for input(s): "VITAMINB12", "FOLATE", "FERRITIN", "TIBC", "IRON", "RETICCTPCT" in the last 72 hours. Urine analysis:    Component Value Date/Time   COLORURINE AMBER (A) 07/29/2023 1033   APPEARANCEUR CLOUDY (A) 07/29/2023 1033   LABSPEC 1.013 07/29/2023 1033   PHURINE 5.0 07/29/2023 1033   GLUCOSEU NEGATIVE 07/29/2023 1033   HGBUR LARGE (A) 07/29/2023 1033   BILIRUBINUR NEGATIVE 07/29/2023 1033   BILIRUBINUR negative 04/09/2021 0954   KETONESUR NEGATIVE 07/29/2023 1033   PROTEINUR 100 (A) 07/29/2023 1033   UROBILINOGEN 0.2 04/09/2021 0954   NITRITE NEGATIVE 07/29/2023 1033   LEUKOCYTESUR NEGATIVE 07/29/2023 1033   Sepsis Labs: @LABRCNTIP (procalcitonin:4,lacticidven:4)  ) No results found for this or any previous visit (from the past 240 hours).    Radiology Studies: IR Fluoro Guide CV Line Right Result Date: 08/04/2023 INDICATION: 72 year old male with worsening end-stage renal disease requiring initiation of hemodialysis. EXAM: NON-TUNNELED CENTRAL VENOUS HEMODIALYSIS CATHETER PLACEMENT  WITH ULTRASOUND AND FLUOROSCOPIC GUIDANCE COMPARISON:  None Available. MEDICATIONS: None FLUOROSCOPY TIME:  Two mGy COMPLICATIONS: None immediate. PROCEDURE: Informed written consent was obtained from the patient after a discussion of the risks, benefits, and alternatives to treatment. Questions regarding the procedure were encouraged and answered. The right neck and chest were prepped with chlorhexidine in a sterile fashion, and a sterile drape was applied covering the operative field. Maximum barrier sterile technique with  sterile gowns and gloves were used for the procedure. A timeout was performed prior to the initiation of the procedure. After the overlying soft tissues were anesthetized, a small venotomy incision was created and a micropuncture kit was utilized to access the internal jugular vein. Real-time ultrasound guidance was utilized for vascular access including the acquisition of a permanent ultrasound image documenting patency of the accessed vessel. The microwire was utilized to measure appropriate catheter length. A Rosen wire was advanced to the level of the IVC. Under fluoroscopic guidance, the venotomy was serially dilated, ultimately allowing placement of a 20 cm temporary Trialysis catheter with tip ultimately terminating within the superior aspect of the right atrium. Final catheter positioning was confirmed and documented with a spot radiographic image. The catheter aspirates and flushes normally. The catheter was flushed with appropriate volume heparin dwells. The catheter exit site was secured with a 0 silk retention suture. A dressing was placed. The patient tolerated the procedure well without immediate post procedural complication. IMPRESSION: Successful placement of a right internal jugular approach 20 cm temporary dialysis catheter with tip terminating with in the superior aspect of the right atrium. The catheter is ready for immediate use. PLAN: This catheter may be converted to a tunneled dialysis catheter at a later date as indicated. Marliss Coots, MD [facility] Radiology Electronically Signed   By: Marliss Coots M.D.   On: 08/04/2023 21:06   IR US Guide Vasc Access Right Result Date: 08/04/2023 INDICATION: 72 year old male with worsening end-stage renal disease requiring initiation of hemodialysis. EXAM: NON-TUNNELED CENTRAL VENOUS HEMODIALYSIS CATHETER PLACEMENT WITH ULTRASOUND AND FLUOROSCOPIC GUIDANCE COMPARISON:  None Available. MEDICATIONS:  None FLUOROSCOPY TIME:  Two mGy COMPLICATIONS: None immediate. PROCEDURE: Informed written consent was obtained from the patient after a discussion of the risks, benefits, and alternatives to treatment. Questions regarding the procedure were encouraged and answered. The right neck and chest were prepped with chlorhexidine in a sterile fashion, and a sterile drape was applied covering the operative field. Maximum barrier sterile technique with sterile gowns and gloves were used for the procedure. A timeout was performed prior to the initiation of the procedure. After the overlying soft tissues were anesthetized, a small venotomy incision was created and a micropuncture kit was utilized to access the internal jugular vein. Real-time ultrasound guidance was utilized for vascular access including the acquisition of a permanent ultrasound image documenting patency of the accessed vessel. The microwire was utilized to measure appropriate catheter length. A Rosen wire was advanced to the level of the IVC. Under fluoroscopic guidance, the venotomy was serially dilated, ultimately allowing placement of a 20 cm temporary Trialysis catheter with tip ultimately terminating within the superior aspect of the right atrium. Final catheter positioning was confirmed and documented with a spot radiographic image. The catheter aspirates and flushes normally. The catheter was flushed with appropriate volume heparin dwells. The catheter exit site was secured with a 0 silk retention suture. A dressing was placed. The patient tolerated the procedure well without immediate post procedural complication. IMPRESSION: Successful  placement of a right internal jugular approach 20 cm temporary dialysis catheter with tip terminating with in the superior aspect of the right atrium. The catheter is ready for immediate use. PLAN: This catheter may be converted to a tunneled dialysis catheter at a later date as indicated. Marliss Coots, MD [facility] Radiology Electronically Signed   By: Marliss Coots M.D.   On: 08/04/2023 21:06     Scheduled Meds:  calcium acetate  667 mg Oral TID WC   Chlorhexidine Gluconate Cloth  6 each Topical Daily   darbepoetin (ARANESP) injection - DIALYSIS  60 mcg Subcutaneous Q Thu-1800   feeding supplement  237 mL Oral BID BM   finasteride  5 mg Oral Daily   guaiFENesin  600 mg Oral BID   polyethylene glycol  17 g Oral BID   rosuvastatin  10 mg Oral Daily   tamsulosin  0.4 mg Oral Daily   Continuous Infusions:  sodium chloride irrigation       LOS: 15 days    Time spent:    Zannie Cove, MD [facility]   08/06/2023, 11:51 AM

## 2023-08-06 NOTE — Progress Notes (Signed)
Patient ID: AESON SGRO, male   DOB: January 29, 1951, 72 y.o.   MRN: [id number] [facility] Progress Note   Assessment/ Plan:   1.  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   Continue to hold ARB and SGLT2 inhibitor for now.   With progressive azotemia and now uremia (asterixus) even after holding all diuretics started RRT 08/05/23 with RIJ Temp HD cath (placed 12/19 IR) HD #2 today  2.5h, 1.5 L UF, HD cath, no heparin HD #3 tentative 12/23 Will d/w IR conversion to [facility] 12/23 Will look into outpt CLIP 12/23  2.  Acute exacerbation of HFpEF Vol status improved, holding diuretics as above 3.  Encephalopathy: stable, trend wit HD 4.  Anemia: Secondary to chronic disease including chronic kidney disease.  IV iron was given; CTM; ESA qThursday 5.  Hypertension: Blood pressure stable.  HTN meds stopped 6. Pleural effusion: s/p thora predominantly lymphocytic. Some malignancy concern. First cytology negative for malignant cells 7. Mild hyponatremia, should stablize and correct with HD 8. CKDBMD : P normalizeed on binders  Subjective:    Uneventful night CBI is clamped, UOP not clear SCr and BUN increasing in interdialytic window Dtr [place] joined [place] by phone   Objective:   BP (!) 113/55 (BP Location: Left Arm)   Pulse 77   Temp 98.4 F (36.9 C) (Oral)   Resp 16   Ht 5\' 6"  (1.676 m)   Wt 98 kg   SpO2 98%   BMI 34.87 kg/m   Intake/Output Summary (Last 24 hours) at 08/06/2023 0947 Last data filed at 08/05/2023 1500 Gross per 24 hour  Intake --  Output 700 ml  Net -700 ml   Weight change:   Physical Exam: Gen: in bed, eating breakfast CVS: normal rate, no murmurs Resp: Bilateral chest rise with no increased work of breathing; diminished throughout Abd: Soft, obese, nontender, bowel sounds normal Ext: trace  bilateral lower extremity edema  R internal jugular Temp HD cath clean and intact   Urine sediment exam on 12/13: 10-20 RBCs per high-powered field none with dysmorphic features, rare renal tubular epithelial cells, rare WBCs, no muddy brown casts, no cellular casts, 1 lipid-laden cast.  Imaging: IR Fluoro Guide CV Line Right Result Date: 08/04/2023 INDICATION: 72 year old male with worsening end-stage renal disease requiring initiation of hemodialysis. EXAM: NON-TUNNELED CENTRAL VENOUS HEMODIALYSIS CATHETER PLACEMENT WITH ULTRASOUND AND FLUOROSCOPIC GUIDANCE COMPARISON:  None Available. MEDICATIONS: None FLUOROSCOPY TIME:  Two mGy COMPLICATIONS: None immediate. PROCEDURE: Informed written consent was obtained from the patient after a discussion of the risks, benefits, and alternatives to treatment. Questions regarding the procedure were encouraged and answered. The right neck and chest were prepped with chlorhexidine in a sterile fashion, and a sterile drape was applied covering the operative field. Maximum barrier sterile technique with sterile gowns and gloves were used for the procedure. A timeout was performed prior to the initiation of the procedure. After the overlying soft tissues were anesthetized, a small venotomy incision was created and a micropuncture kit was utilized to access the internal jugular vein. Real-time ultrasound guidance was utilized for vascular access including the acquisition of a permanent ultrasound image documenting patency of the accessed vessel. The microwire was utilized to measure appropriate catheter length. A Rosen wire was advanced to the level of the IVC. Under fluoroscopic guidance, the venotomy was serially dilated, ultimately allowing placement of  a 20 cm temporary Trialysis catheter with tip ultimately terminating within the superior aspect of the right atrium. Final catheter positioning was confirmed and documented with a spot radiographic image. The catheter  aspirates and flushes normally. The catheter was flushed with appropriate volume heparin dwells. The catheter exit site was secured with a 0 silk retention suture. A dressing was placed. The patient tolerated the procedure well without immediate post procedural complication. IMPRESSION: Successful placement of a right internal jugular approach 20 cm temporary dialysis catheter with tip terminating with in the superior aspect of the right atrium. The catheter is ready for immediate use. PLAN: This catheter may be converted to a tunneled dialysis catheter at a later date as indicated. Marliss Coots, MD [facility] Radiology Electronically Signed   By: Marliss Coots M.D.   On: 08/04/2023 21:06   IR US Guide Vasc Access Right Result Date: 08/04/2023 INDICATION: 72 year old male with worsening end-stage renal disease requiring initiation of hemodialysis. EXAM: NON-TUNNELED CENTRAL VENOUS HEMODIALYSIS CATHETER PLACEMENT WITH ULTRASOUND AND FLUOROSCOPIC GUIDANCE COMPARISON:  None Available. MEDICATIONS: None FLUOROSCOPY TIME:  Two mGy COMPLICATIONS: None immediate. PROCEDURE: Informed written consent was obtained from the patient after a discussion of the risks, benefits, and alternatives to treatment. Questions regarding the procedure were encouraged and answered. The right neck and chest were prepped with chlorhexidine in a sterile fashion, and a sterile drape was applied covering the operative field. Maximum barrier sterile technique with sterile gowns and gloves were used for the procedure. A timeout was performed prior to the initiation of the procedure. After the overlying soft tissues were anesthetized, a small venotomy incision was created and a micropuncture kit was utilized to access the internal jugular vein. Real-time ultrasound guidance was utilized for vascular access including the acquisition of a permanent ultrasound image documenting patency of the  accessed vessel. The microwire was utilized to measure appropriate catheter length. A Rosen wire was advanced to the level of the IVC. Under fluoroscopic guidance, the venotomy was serially dilated, ultimately allowing placement of a 20 cm temporary Trialysis catheter with tip ultimately terminating within the superior aspect of the right atrium. Final catheter positioning was confirmed and documented with a spot radiographic image. The catheter aspirates and flushes normally. The catheter was flushed with appropriate volume heparin dwells. The catheter exit site was secured with a 0 silk retention suture. A dressing was placed. The patient tolerated the procedure well without immediate post procedural complication. IMPRESSION: Successful placement of a right internal jugular approach 20 cm temporary dialysis catheter with tip terminating with in the superior aspect of the right atrium. The catheter is ready for immediate use. PLAN: This catheter may be converted to a tunneled dialysis catheter at a later date as indicated. Marliss Coots, MD [facility] Radiology Electronically Signed   By: Marliss Coots M.D.   On: 08/04/2023 21:06     Labs: BMET Recent Labs  Lab 07/31/23 0308 08/01/23 0256 08/02/23 0257 08/03/23 0738 08/04/23 0244 08/05/23 0701 08/06/23 0236  NA 132* 132* 134* 132* 132* 131* 131*  K 4.3 4.3 4.6 4.3 5.0 4.5 4.3  CL 84* 84* 82* 81* 81* 88* 91*  CO2 36* 36* 37* 37* 35* 28 31  GLUCOSE 138* 121* 124* 108* 114* 105* 105*  BUN 176* 186* 191* 199* 208* 147* 158*  CREATININE 3.33* 3.14* 3.42* 3.78* 4.40* 3.65* 4.01*  CALCIUM 9.4 9.7 10.3 10.1 10.0 9.3 9.0  PHOS 4.6 3.2 4.1  --   --   --   --  CBC Recent Labs  Lab 08/01/23 0256 08/03/23 1230 08/04/23 0244 08/05/23 0939  WBC 7.5 7.9 7.4 8.0  HGB 8.1* 7.7* 7.4* 7.3*  HCT 26.3* 24.5* 23.7* 24.3*  MCV 96.0 93.9 94.8 98.8  PLT 151 181 176 157    Medications:     calcium  acetate  667 mg Oral TID WC   Chlorhexidine Gluconate Cloth  6 each Topical Daily   darbepoetin (ARANESP) injection - DIALYSIS  60 mcg Subcutaneous Q Thu-1800   feeding supplement  237 mL Oral BID BM   finasteride  5 mg Oral Daily   guaiFENesin  600 mg Oral BID   polyethylene glycol  17 g Oral BID   rosuvastatin  10 mg Oral Daily   tamsulosin  0.4 mg Oral Daily   Arita Miss  08/06/2023, 9:47 AM

## 2023-08-06 NOTE — Progress Notes (Signed)
   08/06/23 1200  Vitals  Temp 97.6 F (36.4 C)  Pulse Rate (!) 57  Resp (!) 21  BP 116/61  SpO2 100 %  O2 Device Nasal Cannula  Weight 96.4 kg  Type of Weight Post-Dialysis  Oxygen Therapy  O2 Flow Rate (L/min) 3 L/min  Patient Activity (if Appropriate) In bed  Pulse Oximetry Type Continuous  Post Treatment  Dialyzer Clearance Clear  Hemodialysis Intake (mL) 0 mL  Liters Processed 37.5  Fluid Removed (mL) 1500 mL  Tolerated HD Treatment Yes   Received patient in bed to unit.  Alert and oriented.  Informed consent signed and in chart.   TX duration:2.5hrs  Patient tolerated well.  Transported back to the room  Alert, without acute distress.  Hand-off given to patient's nurse.   Access used: [facility] Access issues: none  Total UF removed: 1.5L Medication(s) given: none   Na'Shaminy T [facility]

## 2023-08-07 ENCOUNTER — Encounter (HOSPITAL_COMMUNITY): Payer: Self-pay | Admitting: Internal Medicine

## 2023-08-07 DIAGNOSIS — I5033 Acute on chronic diastolic (congestive) heart failure: Secondary | ICD-10-CM | POA: Diagnosis not present

## 2023-08-07 LAB — IRON AND TIBC
Iron: 45 ug/dL (ref 45–182)
Saturation Ratios: 13 % — ABNORMAL LOW (ref 17.9–39.5)
TIBC: 357 ug/dL (ref 250–450)
UIBC: 312 ug/dL

## 2023-08-07 LAB — CBC
HCT: 25.9 % — ABNORMAL LOW (ref 39.0–52.0)
Hemoglobin: 7.8 g/dL — ABNORMAL LOW (ref 13.0–17.0)
MCH: 29.9 pg (ref 26.0–34.0)
MCHC: 30.1 g/dL (ref 30.0–36.0)
MCV: 99.2 fL (ref 80.0–100.0)
Platelets: 154 10*3/uL (ref 150–400)
RBC: 2.61 MIL/uL — ABNORMAL LOW (ref 4.22–5.81)
RDW: 16.4 % — ABNORMAL HIGH (ref 11.5–15.5)
WBC: 8.9 10*3/uL (ref 4.0–10.5)
nRBC: 0.5 % — ABNORMAL HIGH (ref 0.0–0.2)

## 2023-08-07 LAB — BASIC METABOLIC PANEL
Anion gap: 16 — ABNORMAL HIGH (ref 5–15)
BUN: 122 mg/dL — ABNORMAL HIGH (ref 8–23)
CO2: 27 mmol/L (ref 22–32)
Calcium: 9.6 mg/dL (ref 8.9–10.3)
Chloride: 88 mmol/L — ABNORMAL LOW (ref 98–111)
Creatinine, Ser: 4.31 mg/dL — ABNORMAL HIGH (ref 0.61–1.24)
GFR, Estimated: 14 mL/min — ABNORMAL LOW (ref >60)
Glucose, Bld: 103 mg/dL — ABNORMAL HIGH (ref 70–99)
Potassium: 4.7 mmol/L (ref 3.5–5.1)
Sodium: 131 mmol/L — ABNORMAL LOW (ref 135–145)

## 2023-08-07 LAB — RETICULOCYTES
Immature Retic Fract: 30.2 % — ABNORMAL HIGH (ref 2.3–15.9)
RBC.: 2.63 MIL/uL — ABNORMAL LOW (ref 4.22–5.81)
Retic Count, Absolute: 212.8 10*3/uL — ABNORMAL HIGH (ref 19.0–186.0)
Retic Ct Pct: 8.1 % — ABNORMAL HIGH (ref 0.4–3.1)

## 2023-08-07 LAB — VITAMIN B12: Vitamin B-12: 2389 pg/mL — ABNORMAL HIGH (ref 180–914)

## 2023-08-07 LAB — FERRITIN: Ferritin: 333 ng/mL (ref 24–336)

## 2023-08-07 LAB — FOLATE: Folate: 11.9 ng/mL (ref >5.9)

## 2023-08-07 NOTE — Progress Notes (Signed)
Foley draining clear-yellow urine. Ok for voiding trial, from a GU standpoint, tomorrow morning. Call back PRN.

## 2023-08-07 NOTE — Progress Notes (Signed)
Patient ID: Andrew Schmitt, male   DOB: 26-Sep-1950, 72 y.o.   MRN: [id number] [facility] Progress Note   Assessment/ Plan:   1.  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   Continue to hold ARB and SGLT2 inhibitor for now.   With progressive azotemia and now uremia (asterixus) even after holding all diuretics started RRT 08/05/23 with RIJ Temp  HD #3 tentative 12/23: 2L UF, 3h, HD cath, no heparin Will ask IR conversion to [facility] 12/23 Will look into outpt CLIP 12/23 -- msg sent to renal navigator  2.  Acute exacerbation of HFpEF Vol status improved, holding diuretics as above 3.  Encephalopathy: stable, trend wit HD 4.  Anemia: Secondary to chronic disease including chronic kidney disease.  IV iron was given; CTM; ESA qThursday 5.  Hypertension: Blood pressure stable.  HTN meds stopped 6. Pleural effusion: s/p thora predominantly lymphocytic. Some malignancy concern. First cytology negative for malignant cells 7. Mild hyponatremia, should stablize and correct with HD 8. CKDBMD : P normalized on binders  Subjective:    Uneventful night HD yesterday 1.5L UF CBI open here this AM SCr went up? Odd; BUN improving Dtr [place] joined [place] by phone He has no c/o or needs   Objective:   BP 104/64 (BP Location: Left Arm)   Pulse 67   Temp 97.9 F (36.6 C) (Oral)   Resp 18   Ht 5\' 6"  (1.676 m)   Wt 96.9 kg   SpO2 96%   BMI 34.48 kg/m   Intake/Output Summary (Last 24 hours) at 08/07/2023 9563 Last data filed at 08/07/2023 0900 Gross per 24 hour  Intake 240 ml  Output 5450 ml  Net -5210 ml   Weight change:   Physical Exam: Gen: in bed CVS: normal rate, no murmurs Resp: Bilateral chest rise with no increased work of breathing; diminished throughout Abd: Soft, obese, nontender, bowel sounds normal Ext: trace bilateral  lower extremity edema  R internal jugular Temp HD cath clean and intact   Urine sediment exam on 12/13: 10-20 RBCs per high-powered field none with dysmorphic features, rare renal tubular epithelial cells, rare WBCs, no muddy brown casts, no cellular casts, 1 lipid-laden cast.  Imaging: No results found.    Labs: BMET Recent Labs  Lab 08/01/23 0256 08/02/23 0257 08/03/23 0738 08/04/23 0244 08/05/23 0701 08/06/23 0236 08/07/23 0227  NA 132* 134* 132* 132* 131* 131* 131*  K 4.3 4.6 4.3 5.0 4.5 4.3 4.7  CL 84* 82* 81* 81* 88* 91* 88*  CO2 36* 37* 37* 35* 28 31 27   GLUCOSE 121* 124* 108* 114* 105* 105* 103*  BUN 186* 191* 199* 208* 147* 158* 122*  CREATININE 3.14* 3.42* 3.78* 4.40* 3.65* 4.01* 4.31*  CALCIUM 9.7 10.3 10.1 10.0 9.3 9.0 9.6  PHOS 3.2 4.1  --   --   --   --   --    CBC Recent Labs  Lab 08/03/23 1230 08/04/23 0244 08/05/23 0939 08/07/23 0227  WBC 7.9 7.4 8.0 8.9  HGB 7.7* 7.4* 7.3* 7.8*  HCT 24.5* 23.7* 24.3* 25.9*  MCV 93.9 94.8 98.8 99.2  PLT 181 176 157 154    Medications:     calcium acetate  667 mg Oral TID WC   Chlorhexidine Gluconate Cloth  6 each Topical Daily   darbepoetin (ARANESP) injection - DIALYSIS  60 mcg Subcutaneous  Q Thu-1800   feeding supplement  237 mL Oral BID BM   finasteride  5 mg Oral Daily   guaiFENesin  600 mg Oral BID   polyethylene glycol  17 g Oral BID   rosuvastatin  10 mg Oral Daily   tamsulosin  0.4 mg Oral Daily   Arita Miss  08/07/2023, 9:26 AM

## 2023-08-07 NOTE — Progress Notes (Signed)
PROGRESS NOTE    Andrew Schmitt  ZOX:[id number] DOB: 10-15-1950 DOA: 07/21/2023 PCP: Dorothyann Peng, MD  [name] with chronic diastolic CHF, hypertension, dyslipidemia, ascending aortic aneurysm, BPH, CKD 3b/4, chronic anemia, gout, obesity presented to the ED with progressive weakness and dyspnea x7d. In the ED,  lethargic, hypoxic, bradycardic, placed on NRM   Labs-Na 135,bun 63, cr 3,18 , BNP 183, troponin 337, 278, 309 CXR wbilateral hilar vascular congestion, bilateral pleural effusions, moderate to large on Left. CT chest, A&P: -moderate left pleural effusion with compressive atelectasis, Mild diffuse body wall edema.  -12/6: started on diuretics, Coude catheter placed for hematuria w/ CLots and increased discomfort before overt retention -12/7 more encephalopathic, placed on BiPAP, very mild respiratory acidosis, pCO2 in the 60-70 range -Complicated by worsening AKI, nephrology consulting, diuretic dose increased 12/14 persistent elevation in BUN.  12/15 resumed diuretic therapy  12/16 transitioned to oral loop diuretic therapy.  12/17 holding diuretic therapy, persistent severe BUN elevation 12/18: Gross hematuria with clots after tugging at catheter same morning, Urology consulted, started on continuous bladder irrigation, Foley replaced -12/19, BUN 200 with uremia, temp HD catheter placed, -12/21, CBI clamped, 12/21 overnight hematuria recurred, CBI resumed -12/22, noted to be in A-fib asymptomatic  Subjective: Feels okay, no events overnight, sitting up in recliner, more alert  Assessment and Plan:  Acute on chronic diastolic CHF Left Pleural effusion Last echo 6/24 with EF 60-65%, moderate LVH, grade 2 DD, mildly reduced RV -Pulmonary c/s:: L Thoracentesis 12/10: exudative, cytology negative -Diuresed on IV Lasix, cards and nephrology following, down 30 LB, 21.7 L negative -Repeat echo with EF 65-70% moderate LVH, indeterminate diastolic parameters, normal  RV -BUN/creatinine continued to worsen, appreciate nephrology input, diuretics have been on hold -HD catheter placed 12/19, started HD, second HD 12/21 -increase activity, PT eval  P Atrial fibrillation -New diagnosis, hematuria appears to be clearing, had recurrence overnight, also due for tunneled dialysis catheter tomorrow, will start Eliquis tomorrow evening  Acute hypoxemic hyercapnic respiratory failure, due to acute cardiogenic pulmonary edema, left pleural effusion, complicated with acute metabolic encephalopathy.  12/10 SP left thoracentesis 1100 ml drained -Monitor x-rays periodically  AKI on CKD stage 3b. Hyponatremia,. ESRD -cardiorenal,  baseline creatinine of 2-2.5  -Foley catheter placed 12/6 for hematuria with clots, ? early retention, has known BPH, CT on admission with bilateral renal cysts as well -Creatinine worsening, per nephrology, diuretics held now -BUN over 200 with uremia, nephrology following, temporary dialysis catheter placed 12/19, started HD 12/19 then 12/21 -Plan for [facility] ? Tomorrow and CLIP for HD  Gross hematuria Traumatic Foley dislodgement BPH (benign prostatic hyperplasia) Right kidney lesion noted on CT Continue with flomax and proscar  -Foley catheter/bulb was dislodged after tugging yesterday morning, this was replaced, appreciate urology input, started CBI 12/18, CBI clamped 12/20, urology following -Eventually needs workup, renal lesion noted on imaging  Anemia of chronic renal disease -Hemoglobin continues to trend down, check anemia panel with a.m. labs  Second degree AV block, Mobitz type I Continue to hold on AV blockers.  Obesity, class 2 Calculated BMI is 37,4  DVT prophylaxis: Hep SQ-> held 12/18 Code Status: Full Code Family Communication: d/w daughter Disposition Plan: To be determined  Consultants: Nephrology, cards, IR   Procedures: 12/19, temp dialysis catheter placement  Antimicrobials:    Objective: Vitals:    08/07/23 0100 08/07/23 0400 08/07/23 0500 08/07/23 0848  BP: (!) 94/59 102/65  104/64  Pulse: 80 62  67  Resp: 20 18  18  Temp: 98.2 F (36.8 C) 98.2 F (36.8 C)  97.9 F (36.6 C)  TempSrc: Oral Oral  Oral  SpO2: 100% 100%  96%  Weight:   96.9 kg   Height:        Intake/Output Summary (Last 24 hours) at 08/07/2023 1009 Last data filed at 08/07/2023 0900 Gross per 24 hour  Intake 240 ml  Output 5450 ml  Net -5210 ml   Filed Weights   08/06/23 0915 08/06/23 1200 08/07/23 0500  Weight: 97.9 kg 96.4 kg 96.9 kg    Examination:  Gen: Frail elderly male sitting on recliner, more awake alert, oriented HEENT: No JVD, right IJ HD catheter CVS: S1-S2, irregular rhythm Lungs: Clear bilaterally Abdomen: Soft, nontender, bowel sounds present  Extremities: Trace edema  GU: Foley catheter with CBI    Data Reviewed:   CBC: Recent Labs  Lab 08/01/23 0256 08/03/23 1230 08/04/23 0244 08/05/23 0939 08/07/23 0227  WBC 7.5 7.9 7.4 8.0 8.9  HGB 8.1* 7.7* 7.4* 7.3* 7.8*  HCT 26.3* 24.5* 23.7* 24.3* 25.9*  MCV 96.0 93.9 94.8 98.8 99.2  PLT 151 181 176 157 154   Basic Metabolic Panel: Recent Labs  Lab 08/01/23 0256 08/02/23 0257 08/03/23 0738 08/04/23 0244 08/05/23 0701 08/06/23 0236 08/07/23 0227  NA 132* 134* 132* 132* 131* 131* 131*  K 4.3 4.6 4.3 5.0 4.5 4.3 4.7  CL 84* 82* 81* 81* 88* 91* 88*  CO2 36* 37* 37* 35* 28 31 27   GLUCOSE 121* 124* 108* 114* 105* 105* 103*  BUN 186* 191* 199* 208* 147* 158* 122*  CREATININE 3.14* 3.42* 3.78* 4.40* 3.65* 4.01* 4.31*  CALCIUM 9.7 10.3 10.1 10.0 9.3 9.0 9.6  MG  --   --  2.6*  --   --   --   --   PHOS 3.2 4.1  --   --   --   --   --    GFR: Estimated Creatinine Clearance: 16.4 mL/min (A) (by C-G formula based on SCr of 4.31 mg/dL (H)). Liver Function Tests: Recent Labs  Lab 08/01/23 0256 08/02/23 0257  ALBUMIN 3.3* 3.5   No results for input(s): "LIPASE", "AMYLASE" in the last 168 hours. No results for input(s):  "AMMONIA" in the last 168 hours. Coagulation Profile: No results for input(s): "INR", "PROTIME" in the last 168 hours. Cardiac Enzymes: No results for input(s): "CKTOTAL", "CKMB", "CKMBINDEX", "TROPONINI" in the last 168 hours. BNP (last 3 results) No results for input(s): "PROBNP" in the last 8760 hours. HbA1C: No results for input(s): "HGBA1C" in the last 72 hours. CBG: No results for input(s): "GLUCAP" in the last 168 hours.  Lipid Profile: No results for input(s): "CHOL", "HDL", "LDLCALC", "TRIG", "CHOLHDL", "LDLDIRECT" in the last 72 hours. Thyroid Function Tests: No results for input(s): "TSH", "T4TOTAL", "FREET4", "T3FREE", "THYROIDAB" in the last 72 hours. Anemia Panel: Recent Labs    08/07/23 0227  VITAMINB12 2,389*  FOLATE 11.9  FERRITIN 333  TIBC 357  IRON 45  RETICCTPCT 8.1*   Urine analysis:    Component Value Date/Time   COLORURINE AMBER (A) 07/29/2023 1033   APPEARANCEUR CLOUDY (A) 07/29/2023 1033   LABSPEC 1.013 07/29/2023 1033   PHURINE 5.0 07/29/2023 1033   GLUCOSEU NEGATIVE 07/29/2023 1033   HGBUR LARGE (A) 07/29/2023 1033   BILIRUBINUR NEGATIVE 07/29/2023 1033   BILIRUBINUR negative 04/09/2021 0954   KETONESUR NEGATIVE 07/29/2023 1033   PROTEINUR 100 (A) 07/29/2023 1033   UROBILINOGEN 0.2 04/09/2021 0954   NITRITE NEGATIVE  07/29/2023 1033   LEUKOCYTESUR NEGATIVE 07/29/2023 1033   Sepsis Labs: @LABRCNTIP (procalcitonin:4,lacticidven:4)  ) No results found for this or any previous visit (from the past 240 hours).    Radiology Studies: No results found.    Scheduled Meds:  calcium acetate  667 mg Oral TID WC   Chlorhexidine Gluconate Cloth  6 each Topical Daily   darbepoetin (ARANESP) injection - DIALYSIS  60 mcg Subcutaneous Q Thu-1800   feeding supplement  237 mL Oral BID BM   finasteride  5 mg Oral Daily   guaiFENesin  600 mg Oral BID   polyethylene glycol  17 g Oral BID   rosuvastatin  10 mg Oral Daily   tamsulosin  0.4 mg Oral  Daily   Continuous Infusions:  sodium chloride irrigation       LOS: 16 days    Time spent:    Zannie Cove, MD [facility]   08/07/2023, 10:09 AM

## 2023-08-08 DIAGNOSIS — I5033 Acute on chronic diastolic (congestive) heart failure: Secondary | ICD-10-CM | POA: Diagnosis not present

## 2023-08-08 LAB — CBC
HCT: 22.9 % — ABNORMAL LOW (ref 39.0–52.0)
Hemoglobin: 7 g/dL — ABNORMAL LOW (ref 13.0–17.0)
MCH: 29.5 pg (ref 26.0–34.0)
MCHC: 30.6 g/dL (ref 30.0–36.0)
MCV: 96.6 fL (ref 80.0–100.0)
Platelets: 142 10*3/uL — ABNORMAL LOW (ref 150–400)
RBC: 2.37 MIL/uL — ABNORMAL LOW (ref 4.22–5.81)
RDW: 16.6 % — ABNORMAL HIGH (ref 11.5–15.5)
WBC: 9.1 10*3/uL (ref 4.0–10.5)
nRBC: 0.2 % (ref 0.0–0.2)

## 2023-08-08 LAB — BASIC METABOLIC PANEL
Anion gap: 12 (ref 5–15)
BUN: 141 mg/dL — ABNORMAL HIGH (ref 8–23)
CO2: 28 mmol/L (ref 22–32)
Calcium: 9.4 mg/dL (ref 8.9–10.3)
Chloride: 89 mmol/L — ABNORMAL LOW (ref 98–111)
Creatinine, Ser: 4.93 mg/dL — ABNORMAL HIGH (ref 0.61–1.24)
GFR, Estimated: 12 mL/min — ABNORMAL LOW (ref >60)
Glucose, Bld: 113 mg/dL — ABNORMAL HIGH (ref 70–99)
Potassium: 4.7 mmol/L (ref 3.5–5.1)
Sodium: 129 mmol/L — ABNORMAL LOW (ref 135–145)

## 2023-08-08 LAB — PREPARE RBC (CROSSMATCH)

## 2023-08-08 LAB — ABO/RH: ABO/RH(D): A POS

## 2023-08-08 MED ORDER — HEPARIN SODIUM (PORCINE) 1000 UNIT/ML IJ SOLN
INTRAMUSCULAR | Status: AC
  Filled 2023-08-08: qty 4

## 2023-08-08 MED ORDER — SODIUM CHLORIDE 0.9% IV SOLUTION: Freq: Once | INTRAVENOUS | Status: AC

## 2023-08-08 NOTE — Progress Notes (Signed)
PT Cancellation Note  Patient Details Name: Andrew Schmitt MRN: [id number] DOB: 10/16/1950   Cancelled Treatment:    Reason Eval/Treat Not Completed: Patient at procedure or test/unavailable (Off unit at HD. Will follow up at later date/time as pt able and schedule allows.)   Renaldo Fiddler PT, DPT Acute Rehabilitation Services Office [phone]  08/08/23 9:15 AM

## 2023-08-08 NOTE — Progress Notes (Signed)
Messaged MD about diet order, she saiud okay. Order not received. Pt eating soup and salad that his daughter brought in. Will be NPO for tentative HD cath placement.

## 2023-08-08 NOTE — Plan of Care (Signed)
  Problem: Elimination: Goal: Will not experience complications related to urinary retention Outcome: Progressing   Problem: Safety: Goal: Ability to remain free from injury will improve Outcome: Progressing   Problem: Activity: Goal: Capacity to carry out activities will improve Outcome: Progressing   Problem: Cardiac: Goal: Ability to achieve and maintain adequate cardiopulmonary perfusion will improve Outcome: Progressing

## 2023-08-08 NOTE — Progress Notes (Addendum)
PROGRESS NOTE    Andrew Schmitt  NWG:[id number] DOB: 03/07/1951 DOA: 07/21/2023 PCP: Dorothyann Peng, MD  [name] with chronic diastolic CHF, hypertension, dyslipidemia, ascending aortic aneurysm, BPH, CKD 3b/4, chronic anemia, gout, obesity presented to the ED with progressive weakness and dyspnea x7d. In the ED,  lethargic, hypoxic, bradycardic, placed on NRM   Labs-Na 135,bun 63, cr 3,18 , BNP 183, troponin 337, 278, 309 CXR wbilateral hilar vascular congestion, bilateral pleural effusions, moderate to large on Left. CT chest, A&P: -moderate left pleural effusion with compressive atelectasis, Mild diffuse body wall edema.  -12/6: started on diuretics, Coude catheter placed for hematuria w/ CLots and increased discomfort before overt retention -12/7 more encephalopathic, placed on BiPAP, very mild respiratory acidosis, pCO2 in the 60-70 range -Complicated by worsening AKI, nephrology consulting, diuretic dose increased 12/14 persistent elevation in BUN.  12/15 resumed diuretic therapy  12/16 transitioned to oral loop diuretic therapy.  12/17 holding diuretic therapy, persistent severe BUN elevation 12/18: Gross hematuria with clots after tugging at catheter same morning, Urology consulted, started on continuous bladder irrigation, Foley replaced -12/19, BUN 200 with uremia, temp HD catheter placed, -12/21, CBI clamped, 12/21 overnight hematuria recurred, CBI resumed -12/22, noted to be in A-fib asymptomatic  Subjective: Seen on dialysis, a bit tired, restarted on CBI overnight  Assessment and Plan:  Acute on chronic diastolic CHF Left Pleural effusion Last echo 6/24 with EF 60-65%, moderate LVH, grade 2 DD, mildly reduced RV -Pulmonary c/s:: L Thoracentesis 12/10: exudative, cytology negative -Diuresed on IV Lasix, cards and nephrology following, down 30 LB, 21.7 L negative -Repeat echo with EF 65-70% moderate LVH, indeterminate diastolic parameters, normal RV -BUN/creatinine  continued to worsen, appreciate nephrology input, diuretics have been on hold -HD catheter placed 12/19, started HD -increase activity, PT eval  Transient Atrial fibrillation -New diagnosis, hematuria appears to be clearing, had recurrence overnight, with worsening anemia, hold off on anticoagulation, back in sinus rhythm  Acute hypoxemic hyercapnic respiratory failure, due to acute cardiogenic pulmonary edema, left pleural effusion, complicated with acute metabolic encephalopathy.  12/10 SP left thoracentesis 1100 ml drained -Monitor x-rays periodically  AKI on CKD stage 3b. Hyponatremia,. ESRD -cardiorenal,  baseline creatinine of 2-2.5  -Foley catheter placed 12/6 for hematuria with clots, ? early retention, has known BPH, CT on admission with bilateral renal cysts as well -Creatinine worsening, per nephrology, diuretics held now -BUN over 200 with uremia, nephrology following, temporary dialysis catheter placed 12/19, started HD 12/19, third dialysis today -Plan for [facility] ? Tomorrow and CLIP for HD pending  Gross hematuria Traumatic Foley dislodgement BPH (benign prostatic hyperplasia) Right kidney lesion noted on CT Continue with flomax and proscar  -Foley catheter/bulb was dislodged after tugging yesterday morning, this was replaced, appreciate urology input, started CBI 12/18, CBI clamped 12/20, urology following -Restarted CBI again 12/22, will wean down -Eventually needs workup, renal lesion noted on imaging  Anemia of chronic renal disease -Hemoglobin continues to trend down,  -Primarily from hematuria and anemia of chronic disease -Transfuse 1 unit PRBC, anemia panel with iron deficiency as well, add IV iron tomorrow  Second degree AV block, Mobitz type I Continue to hold on AV blockers.  Obesity, class 2 Calculated BMI is 37,4  DVT prophylaxis: Hep SQ-> held 12/18 Code Status: Full Code Family Communication: d/w daughter Disposition Plan: To be  determined  Consultants: Nephrology, cards, IR   Procedures: 12/19, temp dialysis catheter placement  Antimicrobials:    Objective: Vitals:   08/08/23 1130 08/08/23 1200  08/08/23 1230 08/08/23 1300  BP: (!) 103/58 99/61 (!) 104/54 111/63  Pulse: 83 87 67 87  Resp: (!) 25 (!) 22 (!) 25 (!) 24  Temp:    98.2 F (36.8 C)  TempSrc:      SpO2: 98% 99% 98% 99%  Weight:    95.7 kg  Height:        Intake/Output Summary (Last 24 hours) at 08/08/2023 1407 Last data filed at 08/08/2023 1337 Gross per 24 hour  Intake 5940 ml  Output 10702.7 ml  Net -4762.7 ml   Filed Weights   08/08/23 0501 08/08/23 0854 08/08/23 1300  Weight: 96.4 kg 97.1 kg 95.7 kg    Examination:  Gen: Frail elderly male sitting on recliner, more awake alert, oriented HEENT: No JVD, right IJ HD catheter CVS: S1-S2, irregular rhythm Lungs: Clear bilaterally Abdomen: Soft, nontender, bowel sounds present  Extremities: Trace edema  GU: Foley catheter with CBI    Data Reviewed:   CBC: Recent Labs  Lab 08/03/23 1230 08/04/23 0244 08/05/23 0939 08/07/23 0227 08/08/23 0227  WBC 7.9 7.4 8.0 8.9 9.1  HGB 7.7* 7.4* 7.3* 7.8* 7.0*  HCT 24.5* 23.7* 24.3* 25.9* 22.9*  MCV 93.9 94.8 98.8 99.2 96.6  PLT 181 176 157 154 142*   Basic Metabolic Panel: Recent Labs  Lab 08/02/23 0257 08/03/23 0738 08/04/23 0244 08/05/23 0701 08/06/23 0236 08/07/23 0227 08/08/23 0227  NA 134* 132* 132* 131* 131* 131* 129*  K 4.6 4.3 5.0 4.5 4.3 4.7 4.7  CL 82* 81* 81* 88* 91* 88* 89*  CO2 37* 37* 35* 28 31 27 28   GLUCOSE 124* 108* 114* 105* 105* 103* 113*  BUN 191* 199* 208* 147* 158* 122* 141*  CREATININE 3.42* 3.78* 4.40* 3.65* 4.01* 4.31* 4.93*  CALCIUM 10.3 10.1 10.0 9.3 9.0 9.6 9.4  MG  --  2.6*  --   --   --   --   --   PHOS 4.1  --   --   --   --   --   --    GFR: Estimated Creatinine Clearance: 14.2 mL/min (A) (by C-G formula based on SCr of 4.93 mg/dL (H)). Liver Function Tests: Recent Labs  Lab  08/02/23 0257  ALBUMIN 3.5   No results for input(s): "LIPASE", "AMYLASE" in the last 168 hours. No results for input(s): "AMMONIA" in the last 168 hours. Coagulation Profile: No results for input(s): "INR", "PROTIME" in the last 168 hours. Cardiac Enzymes: No results for input(s): "CKTOTAL", "CKMB", "CKMBINDEX", "TROPONINI" in the last 168 hours. BNP (last 3 results) No results for input(s): "PROBNP" in the last 8760 hours. HbA1C: No results for input(s): "HGBA1C" in the last 72 hours. CBG: No results for input(s): "GLUCAP" in the last 168 hours.  Lipid Profile: No results for input(s): "CHOL", "HDL", "LDLCALC", "TRIG", "CHOLHDL", "LDLDIRECT" in the last 72 hours. Thyroid Function Tests: No results for input(s): "TSH", "T4TOTAL", "FREET4", "T3FREE", "THYROIDAB" in the last 72 hours. Anemia Panel: Recent Labs    08/07/23 0227  VITAMINB12 2,389*  FOLATE 11.9  FERRITIN 333  TIBC 357  IRON 45  RETICCTPCT 8.1*   Urine analysis:    Component Value Date/Time   COLORURINE AMBER (A) 07/29/2023 1033   APPEARANCEUR CLOUDY (A) 07/29/2023 1033   LABSPEC 1.013 07/29/2023 1033   PHURINE 5.0 07/29/2023 1033   GLUCOSEU NEGATIVE 07/29/2023 1033   HGBUR LARGE (A) 07/29/2023 1033   BILIRUBINUR NEGATIVE 07/29/2023 1033   BILIRUBINUR negative 04/09/2021 0954   KETONESUR  NEGATIVE 07/29/2023 1033   PROTEINUR 100 (A) 07/29/2023 1033   UROBILINOGEN 0.2 04/09/2021 0954   NITRITE NEGATIVE 07/29/2023 1033   LEUKOCYTESUR NEGATIVE 07/29/2023 1033   Sepsis Labs: @LABRCNTIP (procalcitonin:4,lacticidven:4)  ) No results found for this or any previous visit (from the past 240 hours).    Radiology Studies: No results found.    Scheduled Meds:  sodium chloride   Intravenous Once   calcium acetate  667 mg Oral TID WC   Chlorhexidine Gluconate Cloth  6 each Topical Daily   darbepoetin (ARANESP) injection - DIALYSIS  60 mcg Subcutaneous Q Thu-1800   feeding supplement  237 mL Oral BID BM    finasteride  5 mg Oral Daily   guaiFENesin  600 mg Oral BID   heparin sodium (porcine)       polyethylene glycol  17 g Oral BID   rosuvastatin  10 mg Oral Daily   tamsulosin  0.4 mg Oral Daily   Continuous Infusions:  sodium chloride irrigation       LOS: 17 days    Time spent:    Zannie Cove, MD [facility]   08/08/2023, 2:07 PM

## 2023-08-08 NOTE — Progress Notes (Signed)
   08/08/23 1300  Vitals  Temp 98.2 F (36.8 C)  Pulse Rate 87  Resp (!) 24  BP 111/63  SpO2 99 %  O2 Device Room Air  Weight 95.7 kg  Type of Weight Post-Dialysis  Oxygen Therapy  Patient Activity (if Appropriate) In bed  Pulse Oximetry Type Continuous  Oximetry Probe Site Changed No  Post Treatment  Dialyzer Clearance Lightly streaked  Hemodialysis Intake (mL) 0 mL  Liters Processed 45.1  Fluid Removed (mL) 1.7 mL  Tolerated HD Treatment No (Comment)  Post-Hemodialysis Comments Treatment ended 15 minutes early due to hypotensive episode   Received patient in bed to unit.  Alert and oriented.  Informed consent signed and in chart.   TX duration:3hrs  Patient tolerated well.  Transported back to the room  Alert, without acute distress.  Hand-off given to patient's nurse.   Access used: [facility] Access issues: none  Total UF removed: 1.7L Medication(s) given: none    Na[facility]Shaminy T [facility]

## 2023-08-08 NOTE — Progress Notes (Signed)
OT Cancellation Note  Patient Details Name: MELIH HARDEMAN MRN: [id number] DOB: [birth year]   Cancelled Treatment:    Reason Eval/Treat Not Completed: Patient at procedure or test/ unavailable. Pt off unit at HD. Will follow up at later date/time as pt able and schedule allows    Galen Manila 08/08/2023, 9:53 AM

## 2023-08-08 NOTE — Plan of Care (Signed)
  Problem: Clinical Measurements: Goal: Diagnostic test results will improve Outcome: Progressing Goal: Respiratory complications will improve Outcome: Progressing Goal: Cardiovascular complication will be avoided Outcome: Progressing   Problem: Nutrition: Goal: Adequate nutrition will be maintained Outcome: Progressing   Problem: Coping: Goal: Level of anxiety will decrease Outcome: Progressing   Problem: Pain Management: Goal: General experience of comfort will improve Outcome: Progressing

## 2023-08-08 NOTE — Progress Notes (Signed)
Requested to see pt for out-pt HD needs at d/c. Met with pt at bedside while receiving HD. Introduced self and explained role. Pt requested that navigator contact pt's daughter, Felecia Shelling, to discuss clinic preference. Spoke to pt's daughter via phone. Introduced self and explained role. Daughter discussed HD needs with Dr Marisue Humble yesterday and voices interest in TCU at [facility] for possible home therapy options in the future if needed. Daughter advised TCU requires HD 4x's a week and daughter states pt will have transportation to appts. Referral submitted to [facility] admissions today for review. Will assist as needed.   Olivia Canter Renal Navigator [phone]

## 2023-08-08 NOTE — H&P (Signed)
Chief Complaint: Patient was seen in consultation today for tunneled dialysis catheter placement Chief Complaint  Patient presents with   Bradycardia   Shortness of Breath   Fatigue   at the request of Dr. Sabra Heck, MD  Supervising Physician: Oley Balm  Patient Status: [facility] - In-pt  FULL Code status per patient and chart review.  History of Present Illness: Andrew Schmitt is a 72 y.o. male with PMHx of chronic diastolic CHF, CAD. hypertension, dyslipidemia, ascending aortic aneurysm, BPH, CKD 3b/4, chronic anemia, thrombocytopenia, gout, obesity.  Patient is know to IR service, having most recently undergone temp catheter placement on 12/19 by Dr. Elby Showers.  He presented to [facility] on 07/21/23 with generalized weakness and shortness of breath.     He was found to have CHF with volume overload and was admitted for diuresis and treatment.   On admission creatinine was 3.18 with BUN 63.   His renal function has continued to worsen.   Creatinine today is 4.93 with BUN 141.  Interventional Radiology was requested for tunneled dialysis catheter conversion from temp cath place by IR Dr. Elby Showers on 12/19. Patient is scheduled for same in IR tomorrow.   Past Medical History:  Diagnosis Date   Ascending aortic aneurysm (HCC) 03/19/2022   BPH (benign prostatic hyperplasia)    CKD (chronic kidney disease) stage 3, GFR 30-59 ml/min (HCC) 04/05/2019   CKD (chronic kidney disease), stage IV (HCC) 04/05/2019   Coronary artery calcification 03/19/2022   Erectile dysfunction    Gout    Gout    Heart murmur    Hyperlipidemia    Hypertension    Obesity     Past Surgical History:  Procedure Laterality Date   CARDIAC SURGERY     IR FLUORO GUIDE CV LINE RIGHT  08/04/2023   IR US GUIDE VASC ACCESS RIGHT  08/04/2023   THORACENTESIS Left 07/26/2023   Procedure: THORACENTESIS;  Surgeon: Karren Burly, MD;  Location: [facility] ENDOSCOPY;  Service: Pulmonary;  Laterality:  Left;    Allergies: Ace inhibitors  Medications: Prior to Admission medications   Medication Sig Start Date End Date Taking? Authorizing Provider  acetaminophen (TYLENOL) 500 MG tablet Take 1,000 mg by mouth daily.   Yes [provider]  allopurinol (ZYLOPRIM) 300 MG tablet TAKE 1 TABLET BY MOUTH EVERY DAY 07/05/23  Yes Dorothyann Peng, MD  amLODipine (NORVASC) 10 MG tablet TAKE 1 TABLET (10 MG TOTAL) BY MOUTH DAILY. PLEASE CALL OFFICE TO SCHEDULE APPT. 07/01/23  Yes Dorothyann Peng, MD  aspirin EC 81 MG tablet Take 81 mg by mouth daily.   Yes [provider]  carvedilol (COREG) 6.25 MG tablet TAKE 1 TABLET BY MOUTH TWICE A DAY WITH FOOD 06/01/23  Yes Dorothyann Peng, MD  chlorthalidone (HYGROTON) 50 MG tablet Take 50 mg by mouth daily.   Yes [provider]  docusate sodium (COLACE) 50 MG capsule Take 400 mg by mouth daily.   Yes [provider]  finasteride (PROSCAR) 5 MG tablet TAKE 1 TABLET BY MOUTH EVERY DAY 12/17/22  Yes Dorothyann Peng, MD  furosemide (LASIX) 20 MG tablet TAKE 1 TABLET BY MOUTH EVERY DAY 06/21/23  Yes Dorothyann Peng, MD  Homeopathic Products [facility] DRY EYE RELIEF) SOLN Apply 2 drops to eye as needed (Dry eyes).   Yes [provider]  hydrALAZINE (APRESOLINE) 100 MG tablet Take 1 tablet (100 mg total) by mouth 3 (three) times daily. Patient taking differently: Take 100 mg by mouth 2 (  two) times daily. 07/08/23  Yes Dorothyann Peng, MD  Iron-Vitamins (S.S.S. TONIC PO) Take 20 mLs by mouth daily.   Yes [provider]  JARDIANCE 25 MG TABS tablet Take 25 mg by mouth every morning. 03/05/22  Yes [provider]  losartan (COZAAR) 100 MG tablet Take 100 mg by mouth at bedtime. 07/06/23  Yes [provider]  rosuvastatin (CRESTOR) 20 MG tablet Take 1 tablet (20 mg total) by mouth daily. 04/27/22  Yes Chilton Si, MD  sodium chloride (OCEAN) 0.65 % SOLN nasal spray Place 1 spray into both nostrils 3  (three) times a week.   Yes [provider]  tamsulosin (FLOMAX) 0.4 MG CAPS capsule TAKE 1 CAPSLE BY MOUTH DAILY 09/07/22  Yes McKenzie, Mardene Celeste, MD     Family History  Problem Relation Age of Onset   Healthy Mother    Healthy Father     Social History   Socioeconomic History   Marital status: Married    Spouse name: Not on file   Number of children: 4   Years of education: Not on file   Highest education level: Not on file  Occupational History   Occupation: retired  Tobacco Use   Smoking status: Never   Smokeless tobacco: Never  Vaping Use   Vaping status: Never Used  Substance and Sexual Activity   Alcohol use: No   Drug use: No   Sexual activity: Yes  Other Topics Concern   Not on file  Social History Narrative   Not on file   Social Drivers of Health   Financial Resource Strain: Low Risk  (07/07/2023)   Overall Financial Resource Strain (CARDIA)    Difficulty of Paying Living Expenses: Not hard at all  Food Insecurity: No Food Insecurity (07/22/2023)   Hunger Vital Sign    Worried About Running Out of Food in the Last Year: Never true    Ran Out of Food in the Last Year: Never true  Transportation Needs: No Transportation Needs (07/22/2023)   PRAPARE - Administrator, Civil Service (Medical): No    Lack of Transportation (Non-Medical): No  Physical Activity: Insufficiently Active (07/07/2023)   Exercise Vital Sign    Days of Exercise per Week: 2 days    Minutes of Exercise per Session: 30 min  Stress: No Stress Concern Present (07/07/2023)   [employer] of Occupational Health - Occupational Stress Questionnaire    Feeling of Stress : Not at all  Social Connections: Moderately Integrated (07/07/2023)   Social Connection and Isolation Panel [NHANES]    Frequency of Communication with Friends and Family: More than three times a week    Frequency of Social Gatherings with Friends and Family: Three times a week    Attends Religious  Services: More than 4 times per year    Active Member of Clubs or Organizations: No    Attends Banker Meetings: Never    Marital Status: Married    Review of Systems: A 12 point ROS discussed and pertinent positives are indicated in the HPI above.  All other systems are negative.  Review of Systems  Constitutional:  Positive for activity change and fatigue. Negative for chills and fever.  Respiratory:  Positive for shortness of breath. Negative for cough, chest tightness and wheezing.   Cardiovascular:  Negative for chest pain.  Gastrointestinal:  Negative for abdominal pain.  Skin:  Negative for color change.  Psychiatric/Behavioral:  Negative for behavioral problems and confusion.  Vital Signs: BP 106/68   Pulse 86   Temp 98.2 F (36.8 C) (Axillary)   Resp (!) 23   Ht 5\' 6"  (1.676 m)   Wt 210 lb 15.7 oz (95.7 kg)   SpO2 99%   BMI 34.05 kg/m   Advance Care Plan: The advanced care plan/surrogate decision maker was discussed at the time of visit and documented in the medical record.    Physical Exam Constitutional:      General: He is not in acute distress.    Appearance: He is well-developed. He is ill-appearing.  HENT:     Mouth/Throat:     Mouth: Mucous membranes are moist.  Cardiovascular:     Rate and Rhythm: Normal rate and regular rhythm.     Pulses: Normal pulses.  Pulmonary:     Effort: Pulmonary effort is normal.     Breath sounds: Normal breath sounds.  Abdominal:     Palpations: Abdomen is soft.  Skin:    General: Skin is warm and dry.  Neurological:     Mental Status: He is alert and oriented to person, place, and time.  Psychiatric:        Mood and Affect: Mood normal.        Behavior: Behavior normal.     Imaging: IR Fluoro Guide CV Line Right Result Date: 08/04/2023 INDICATION: 72 year old male with worsening end-stage renal disease requiring initiation of hemodialysis. EXAM: NON-TUNNELED CENTRAL VENOUS HEMODIALYSIS CATHETER  PLACEMENT WITH ULTRASOUND AND FLUOROSCOPIC GUIDANCE COMPARISON:  None Available. MEDICATIONS: None FLUOROSCOPY TIME:  Two mGy COMPLICATIONS: None immediate. PROCEDURE: Informed written consent was obtained from the patient after a discussion of the risks, benefits, and alternatives to treatment. Questions regarding the procedure were encouraged and answered. The right neck and chest were prepped with chlorhexidine in a sterile fashion, and a sterile drape was applied covering the operative field. Maximum barrier sterile technique with sterile gowns and gloves were used for the procedure. A timeout was performed prior to the initiation of the procedure. After the overlying soft tissues were anesthetized, a small venotomy incision was created and a micropuncture kit was utilized to access the internal jugular vein. Real-time ultrasound guidance was utilized for vascular access including the acquisition of a permanent ultrasound image documenting patency of the accessed vessel. The microwire was utilized to measure appropriate catheter length. A Rosen wire was advanced to the level of the IVC. Under fluoroscopic guidance, the venotomy was serially dilated, ultimately allowing placement of a 20 cm temporary Trialysis catheter with tip ultimately terminating within the superior aspect of the right atrium. Final catheter positioning was confirmed and documented with a spot radiographic image. The catheter aspirates and flushes normally. The catheter was flushed with appropriate volume heparin dwells. The catheter exit site was secured with a 0 silk retention suture. A dressing was placed. The patient tolerated the procedure well without immediate post procedural complication. IMPRESSION: Successful placement of a right internal jugular approach 20 cm temporary dialysis catheter with tip terminating with in the superior aspect of the right atrium. The catheter is ready for immediate use. PLAN: This catheter may be  converted to a tunneled dialysis catheter at a later date as indicated. Marliss Coots, MD [facility] Electronically Signed   By: Marliss Coots M.D.   On: 08/04/2023 21:06   IR US Guide Vasc Access Right Result Date: 08/04/2023 INDICATION: 72 year old male with worsening end-stage renal disease requiring initiation of hemodialysis. EXAM: NON-TUNNELED CENTRAL VENOUS  HEMODIALYSIS CATHETER PLACEMENT WITH ULTRASOUND AND FLUOROSCOPIC GUIDANCE COMPARISON:  None Available. MEDICATIONS: None FLUOROSCOPY TIME:  Two mGy COMPLICATIONS: None immediate. PROCEDURE: Informed written consent was obtained from the patient after a discussion of the risks, benefits, and alternatives to treatment. Questions regarding the procedure were encouraged and answered. The right neck and chest were prepped with chlorhexidine in a sterile fashion, and a sterile drape was applied covering the operative field. Maximum barrier sterile technique with sterile gowns and gloves were used for the procedure. A timeout was performed prior to the initiation of the procedure. After the overlying soft tissues were anesthetized, a small venotomy incision was created and a micropuncture kit was utilized to access the internal jugular vein. Real-time ultrasound guidance was utilized for vascular access including the acquisition of a permanent ultrasound image documenting patency of the accessed vessel. The microwire was utilized to measure appropriate catheter length. A Rosen wire was advanced to the level of the IVC. Under fluoroscopic guidance, the venotomy was serially dilated, ultimately allowing placement of a 20 cm temporary Trialysis catheter with tip ultimately terminating within the superior aspect of the right atrium. Final catheter positioning was confirmed and documented with a spot radiographic image. The catheter aspirates and flushes normally. The catheter was flushed with appropriate  volume heparin dwells. The catheter exit site was secured with a 0 silk retention suture. A dressing was placed. The patient tolerated the procedure well without immediate post procedural complication. IMPRESSION: Successful placement of a right internal jugular approach 20 cm temporary dialysis catheter with tip terminating with in the superior aspect of the right atrium. The catheter is ready for immediate use. PLAN: This catheter may be converted to a tunneled dialysis catheter at a later date as indicated. Marliss Coots, MD [facility] Electronically Signed   By: Marliss Coots M.D.   On: 08/04/2023 21:06   CT ABDOMEN PELVIS WO CONTRAST Result Date: 08/03/2023 CLINICAL DATA:  Hematuria. EXAM: CT ABDOMEN AND PELVIS WITHOUT CONTRAST TECHNIQUE: Multidetector CT imaging of the abdomen and pelvis was performed following the standard protocol without IV contrast. RADIATION DOSE REDUCTION: This exam was performed according to the departmental dose-optimization program which includes automated exposure control, adjustment of the mA and/or kV according to patient size and/or use of iterative reconstruction technique. COMPARISON:  CT of the chest abdomen pelvis dated 07/21/2023. FINDINGS: Evaluation of this exam is limited in the absence of intravenous contrast. Lower chest: Small bilateral pleural effusions and bibasilar atelectasis. No intra-abdominal free air or free fluid. Hepatobiliary: The liver is unremarkable. No biliary dilatation. The gallbladder is unremarkable Pancreas: The pancreas is unremarkable as visualized. Spleen: Normal in size without focal abnormality. Adrenals/Urinary Tract: The adrenal glands are unremarkable. There is no hydronephrosis or nephrolithiasis on either side. Bilateral renal cysts and additional subcentimeter hypodense lesions which are not characterized on this CT. There is a 3 cm high attenuating lesion in the interpolar  right kidney which is not characterized on this CT and may represent a proteinaceous or hemorrhagic cyst. A solid mass is not excluded further evaluation with ultrasound or MRI on a nonemergent/outpatient basis recommended. The visualized ureters appear unremarkable. The urinary bladder is decompressed around a Foley catheter. There is moderate amount of high attenuating content within the bladder consistent with blood products/clot. Stomach/Bowel: There is moderate sigmoid diverticulosis without active inflammatory changes. There is moderate stool throughout the colon. No bowel obstruction or active inflammation. The appendix is normal. Vascular/Lymphatic: Mild aortoiliac atherosclerotic disease.  The IVC is unremarkable. No portal venous gas. There is no adenopathy. Reproductive: Mildly enlarged prostate gland measuring 5.3 cm in transverse axial diameter. The seminal vesicles are symmetric. Other: Bilateral anterior abdominal wall subcutaneous densities consistent with injections. Musculoskeletal: Degenerative changes of the spine and left hip. No acute osseous pathology. IMPRESSION: 1. No hydronephrosis or nephrolithiasis. 2. Moderate amount of blood products/clot within the urinary bladder. 3. A 3 cm indeterminate high attenuating lesion in the interpolar right kidney. Further evaluation with ultrasound or MRI on a nonemergent/outpatient basis recommended. 4. Moderate sigmoid diverticulosis. No bowel obstruction. Normal appendix. 5. Small bilateral pleural effusions and bibasilar atelectasis. 6.  Aortic Atherosclerosis (ICD10-I70.0). Electronically Signed   By: Elgie Collard M.D.   On: 08/03/2023 17:25   DG CHEST PORT 1 VIEW Result Date: 08/01/2023 CLINICAL DATA:  Pleural effusion. EXAM: PORTABLE CHEST 1 VIEW COMPARISON:  07/28/2023 FINDINGS: Right lung clear. Left base collapse/consolidation with small left pleural effusion, slightly decreased in the interval. The cardio pericardial silhouette is  enlarged. No acute bony abnormality. Telemetry leads overlie the chest. IMPRESSION: Left base collapse/consolidation with small left pleural effusion, slightly decreased in the interval. Electronically Signed   By: Kennith Center M.D.   On: 08/01/2023 18:40   DG CHEST PORT 1 VIEW Result Date: 07/28/2023 CLINICAL DATA:  142230 Pleural effusion 142230 EXAM: PORTABLE CHEST 1 VIEW COMPARISON:  July 26, 2023 FINDINGS: The cardiomediastinal silhouette is unchanged in contour. Moderate LEFT pleural effusion, similar comparison to prior. Trace RIGHT pleural effusion. No pneumothorax. Similar homogeneous opacification of the LEFT lung base. IMPRESSION: Similar moderate LEFT pleural effusion with associated LEFT basilar opacification. Electronically Signed   By: Meda Klinefelter M.D.   On: 07/28/2023 10:02   DG CHEST PORT 1 VIEW Result Date: 07/26/2023 CLINICAL DATA:  Post thoracentesis EXAM: PORTABLE CHEST 1 VIEW COMPARISON:  07/25/2023 FINDINGS: Decreasing left pleural effusion following thoracentesis. No pneumothorax. Small bilateral pleural effusions with bibasilar opacities, likely atelectasis. Cardiomegaly. No overt edema. IMPRESSION: Decreasing left effusion following thoracentesis.  No pneumothorax. Small bilateral effusions with bibasilar atelectasis. Cardiomegaly. Electronically Signed   By: Charlett Nose M.D.   On: 07/26/2023 20:05   DG CHEST PORT 1 VIEW Result Date: 07/25/2023 CLINICAL DATA:  Respiratory failure.  Pleural effusion EXAM: PORTABLE CHEST 1 VIEW, portable upright COMPARISON:  X-ray 07/22/2023 and older.  CT 07/21/2023. FINDINGS: Persistent large left effusion, similar to previous. Obscuration of the left cardiac border. No pneumothorax. Tiny right effusion. No edema. Decreasing vascular congestion. Enlarged cardiopericardial silhouette. Film is rotated to the left. Overlapping cardiac leads. Degenerative changes along the spine. IMPRESSION: Decreasing vascular congestion. Right effusion  appears slightly smaller today. This could be technical. Electronically Signed   By: Karen Kays M.D.   On: 07/25/2023 12:00   ECHOCARDIOGRAM COMPLETE Result Date: 07/23/2023    ECHOCARDIOGRAM REPORT   Patient Name:   ELNATHAN MION [facility] Date of Exam: 07/23/2023 Medical Rec #:  [id number]         Height:       66.0 in Accession #:    [id number]        Weight:       243.6 lb Date of Birth:  September 16, 1950          BSA:          2.175 m Patient Age:    72 years          BP:           129/72 mmHg Patient Gender: M  HR:           76 bpm. Exam Location:  Inpatient Procedure: 2D Echo, Color Doppler and Cardiac Doppler Indications:    Dyspnea  History:        Patient has prior history of Echocardiogram examinations, most                 recent 01/26/2023. CHF, CAD, CKD; Risk Factors:Hypertension and                 Dyslipidemia.  Sonographer:    Milbert Coulter Referring Phys: [id number] PREETHA JOSEPH IMPRESSIONS  1. Left ventricular ejection fraction, by estimation, is 65 to 70%. The left ventricle has normal function. The left ventricle has no regional wall motion abnormalities. There is moderate left ventricular hypertrophy. Left ventricular diastolic parameters are indeterminate.  2. Right ventricular systolic function is normal. The right ventricular size is normal.  3. Left atrial size was moderately dilated.  4. The mitral valve is abnormal. Trivial mitral valve regurgitation. No evidence of mitral stenosis.  5. The aortic valve is tricuspid. There is mild calcification of the aortic valve. Aortic valve regurgitation is not visualized. Aortic valve sclerosis is present, with no evidence of aortic valve stenosis.  6. The inferior vena cava is normal in size with greater than 50% respiratory variability, suggesting right atrial pressure of 3 mmHg. FINDINGS  Left Ventricle: Left ventricular ejection fraction, by estimation, is 65 to 70%. The left ventricle has normal function. The left ventricle has no regional wall  motion abnormalities. The left ventricular internal cavity size was small. There is moderate  left ventricular hypertrophy. Left ventricular diastolic parameters are indeterminate. Right Ventricle: The right ventricular size is normal. No increase in right ventricular wall thickness. Right ventricular systolic function is normal. Left Atrium: Left atrial size was moderately dilated. Right Atrium: Right atrial size was normal in size. Pericardium: There is no evidence of pericardial effusion. Mitral Valve: The mitral valve is abnormal. There is mild thickening of the mitral valve leaflet(s). Trivial mitral valve regurgitation. No evidence of mitral valve stenosis. Tricuspid Valve: The tricuspid valve is normal in structure. Tricuspid valve regurgitation is not demonstrated. No evidence of tricuspid stenosis. Aortic Valve: The aortic valve is tricuspid. There is mild calcification of the aortic valve. Aortic valve regurgitation is not visualized. Aortic valve sclerosis is present, with no evidence of aortic valve stenosis. Aortic valve mean gradient measures 6.0 mmHg. Aortic valve peak gradient measures 12.2 mmHg. Aortic valve area, by VTI measures 2.83 cm. Pulmonic Valve: The pulmonic valve was normal in structure. Pulmonic valve regurgitation is mild. No evidence of pulmonic stenosis. Aorta: The aortic root is normal in size and structure. Venous: The inferior vena cava is normal in size with greater than 50% respiratory variability, suggesting right atrial pressure of 3 mmHg. IAS/Shunts: The interatrial septum was not well visualized.  LEFT VENTRICLE PLAX 2D LVIDd:         4.50 cm   Diastology LVIDs:         2.80 cm   LV e' medial:    8.81 cm/s LV PW:         1.40 cm   LV E/e' medial:  14.6 LV IVS:        1.40 cm   LV e' lateral:   7.62 cm/s LVOT diam:     2.10 cm   LV E/e' lateral: 16.9 LV SV:         74 LV SV Index:  34 LVOT Area:     3.46 cm  RIGHT VENTRICLE RV S prime:     13.90 cm/s TAPSE (M-mode): 1.7 cm  LEFT ATRIUM             Index        RIGHT ATRIUM           Index LA diam:        4.30 cm 1.98 cm/m   RA Area:     31.00 cm LA Vol (A2C):   76.5 ml 35.18 ml/m  RA Volume:   125.00 ml 57.48 ml/m LA Vol (A4C):   70.2 ml 32.28 ml/m LA Biplane Vol: 73.1 ml 33.61 ml/m  AORTIC VALVE AV Area (Vmax):    2.89 cm AV Area (Vmean):   2.68 cm AV Area (VTI):     2.83 cm AV Vmax:           175.00 cm/s AV Vmean:          115.000 cm/s AV VTI:            0.262 m AV Peak Grad:      12.2 mmHg AV Mean Grad:      6.0 mmHg LVOT Vmax:         146.00 cm/s LVOT Vmean:        89.100 cm/s LVOT VTI:          0.214 m LVOT/AV VTI ratio: 0.82  AORTA Ao Root diam: 3.60 cm Ao Asc diam:  3.70 cm MITRAL VALVE MV Area (PHT): 3.34 cm     SHUNTS MV Decel Time: 227 msec     Systemic VTI:  0.21 m MV E velocity: 129.00 cm/s  Systemic Diam: 2.10 cm MV A velocity: 107.00 cm/s MV E/A ratio:  1.21 Charlton Haws MD Electronically signed by Charlton Haws MD Signature Date/Time: 07/23/2023/4:35:16 PM    Final    DG CHEST PORT 1 VIEW Result Date: 07/22/2023 CLINICAL DATA:  Shortness of breath and fatigue EXAM: PORTABLE CHEST 1 VIEW COMPARISON:  07/21/2023 FINDINGS: Cardiac shadow remains enlarged. Central vascular congestion is again identified. Bilateral effusions are noted left considerably greater than right. The overall appearance is similar to that seen on the prior exam. IMPRESSION: Persistent effusions and vascular congestion consistent with CHF. Electronically Signed   By: Alcide Clever M.D.   On: 07/22/2023 21:27   CT CHEST ABDOMEN PELVIS WO CONTRAST Result Date: 07/21/2023 CLINICAL DATA:  Follow-up aneurysm. EXAM: CT CHEST, ABDOMEN AND PELVIS WITHOUT CONTRAST TECHNIQUE: Multidetector CT imaging of the chest, abdomen and pelvis was performed following the standard protocol without IV contrast. RADIATION DOSE REDUCTION: This exam was performed according to the departmental dose-optimization program which includes automated exposure control,  adjustment of the mA and/or kV according to patient size and/or use of iterative reconstruction technique. COMPARISON:  Cardiac CT 01/12/2022 FINDINGS: CT CHEST FINDINGS Cardiovascular: Heart is moderately enlarged. Aorta is normal in size. There is no pericardial effusion. There is mild calcified atherosclerotic disease throughout the aorta. Mediastinum/Nodes: No enlarged mediastinal, hilar, or axillary lymph nodes. Thyroid gland, trachea, and esophagus demonstrate no significant findings. Lungs/Pleura: Small right and moderate left pleural effusions are present. There is compressive atelectasis of the entire left lower lobe. There is compressive atelectasis of the portion of the left upper lobe. There is also atelectasis in the right lower lobe. No evidence for pneumothorax. Musculoskeletal: There is bilateral gynecomastia, right greater than left. No acute fractures are seen. CT ABDOMEN PELVIS FINDINGS Hepatobiliary: No focal  liver abnormality is seen. No gallstones, gallbladder wall thickening, or biliary dilatation. Pancreas: Unremarkable. No pancreatic ductal dilatation or surrounding inflammatory changes. Spleen: Normal in size without focal abnormality. Adrenals/Urinary Tract: Bilateral renal cysts are present measuring up to 2.2 cm. There is no hydronephrosis. There is mild bilateral perinephric fat stranding. No urinary tract calculi are seen. Adrenal glands and bladder are within normal limits. Stomach/Bowel: Stomach is within normal limits. Appendix appears normal. No evidence of bowel wall thickening, distention, or inflammatory changes. There is sigmoid colon diverticulosis. Vascular/Lymphatic: No significant vascular findings are present. No enlarged abdominal or pelvic lymph nodes. Reproductive: Prostate gland is enlarged. Other: There is mild diffuse body wall edema.  There is no ascites. Musculoskeletal: No fracture is seen. IMPRESSION: 1. No aneurysm identified. 2. Moderate left and small right  pleural effusions with compressive atelectasis of the left lower lobe and portion of the left upper lobe. 3. Cardiomegaly. 4. Mild bilateral perinephric fat stranding. Correlate clinically for pyelonephritis. 5. Mild diffuse body wall edema. 6. Bilateral renal cysts. No follow-up imaging recommended. 7. Sigmoid colon diverticulosis. 8. Prostatomegaly. 9. Aortic atherosclerosis. Aortic Atherosclerosis (ICD10-I70.0). Electronically Signed   By: Darliss Cheney M.D.   On: 07/21/2023 19:51   DG Chest Portable 1 View Result Date: 07/21/2023 CLINICAL DATA:  SOB EXAM: PORTABLE CHEST 1 VIEW COMPARISON:  01/08/2004. FINDINGS: Cardiac silhouette appears prominent and partially obscured by large left-sided pleural effusion. Small right-sided pleural effusion. Pulmonary vascular congestion consistent with pulmonary edema. No pneumothorax. There are thoracic degenerative changes. IMPRESSION: Findings consistent with CHF. Electronically Signed   By: Layla Maw M.D.   On: 07/21/2023 15:51    Labs:  CBC: Recent Labs    08/04/23 0244 08/05/23 0939 08/07/23 0227 08/08/23 0227  WBC 7.4 8.0 8.9 9.1  HGB 7.4* 7.3* 7.8* 7.0*  HCT 23.7* 24.3* 25.9* 22.9*  PLT 176 157 154 142*    COAGS: No results for input(s): "INR", "APTT" in the last 8760 hours.  BMP: Recent Labs    08/05/23 0701 08/06/23 0236 08/07/23 0227 08/08/23 0227  NA 131* 131* 131* 129*  K 4.5 4.3 4.7 4.7  CL 88* 91* 88* 89*  CO2 28 31 27 28   GLUCOSE 105* 105* 103* 113*  BUN 147* 158* 122* 141*  CALCIUM 9.3 9.0 9.6 9.4  CREATININE 3.65* 4.01* 4.31* 4.93*  GFRNONAA 17* 15* 14* 12*    LIVER FUNCTION TESTS: Recent Labs    07/21/23 1415 07/23/23 0229 07/24/23 0259 07/25/23 0252 07/29/23 0249 07/30/23 0224 07/31/23 0308 08/01/23 0256 08/02/23 0257  BILITOT 0.8 0.8 1.3* 1.3*  --   --   --   --   --   AST 30 20 26 28   --   --   --   --   --   ALT 32 23 25 27   --   --   --   --   --   ALKPHOS 69 61 54 47  --   --   --   --    --   PROT 7.5 7.1 7.6 7.3  --   --   --   --   --   ALBUMIN 3.1* 2.9* 3.6 3.2*   < > 3.1* 3.1* 3.3* 3.5   < > = values in this interval not displayed.    TUMOR MARKERS: No results for input(s): "AFPTM", "CEA", "CA199", "CHROMGRNA" in the last 8760 hours.  Assessment and Plan: Renal failure with uremia- need for hemodialysis.   Will proceed  with image guided conversion of temp cath to tunneled dialysis catheter in IR tomorrow.  Risks and benefits discussed with the patient including, but not limited to bleeding, infection, vascular injury, pneumothorax which may require chest tube placement, air embolism or even death  All of the patient's questions were answered, patient is agreeable to proceed.  Consent signed and in chart.   Thank you for this interesting consult.  I greatly enjoyed meeting JAHEIM LEVERINGTON and look forward to participating in their care.  A copy of this report was sent to the requesting provider on this date.  Electronically Signed: Sable Feil, PA-C 08/08/2023, 2:29 PM   I spent a total of 20 Minutes    in face to face in clinical consultation, greater than 50% of which was counseling/coordinating care for tunneled dialysis catheter

## 2023-08-08 NOTE — Progress Notes (Signed)
Patient ID: Andrew Schmitt, male   DOB: June 01, 1951, 72 y.o.   MRN: [id number] [facility] Progress Note   Assessment/ Plan:   1.  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   Continue to hold ARB and SGLT2 inhibitor for now.   With progressive azotemia and now uremia (asterixus) even after holding all diuretics started RRT 08/05/23 with RIJ Temp  HD #3 12/23: 2L UF, 3h, HD cath, no heparin. Next HD tentatively planned for Thursday but will first check on him tomorrow and check his labs (BUN mainly) to see if he needs an extra treatment tomorrow Will ask IR conversion to [facility] 12/23 CLIP pending  2.  Acute exacerbation of HFpEF Vol status acceptable currently, holding diuretics as above 3.  Encephalopathy: stable, trend wit HD 4.  Anemia: Secondary to chronic disease including chronic kidney disease.  IV iron was given; CTM; ESA qThursday 5.  Hypertension: Blood pressure stable.  HTN meds stopped 6. Pleural effusion: s/p thora predominantly lymphocytic. Some malignancy concern. First cytology negative for malignant cells 7. Mild hyponatremia, attempting to manage with HD/UF 8. 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 Hematuria, traumatic foley removal: per urology, CBI  Subjective:    Seen and examined on dialysis. Tolerating treatment. Poor BFR (250) today. Pending IR for TDC. On CBI. Tolerating HD currently   Objective:   BP 118/72   Pulse 80   Temp 98.6 F (37 C)   Resp (!) 24   Ht 5\' 6"  (1.676 m)   Wt 97.1 kg   SpO2 98%   BMI 34.55 kg/m   Intake/Output Summary (Last 24 hours) at 08/08/2023 7829 Last data filed at 08/08/2023 5621 Gross per 24 hour  Intake 2940 ml  Output 30865 ml  Net -10911 ml   Weight change: -1.5 kg  Physical Exam: Gen: NAD, laying flat in bed CVS: normal rate, no murmurs Resp: Bilateral chest  rise with no increased work of breathing; diminished throughout Abd: Soft, obese, nontender Ext: very trace bilateral lower extremity edema  Dialysis access: R internal jugular Temp HD cath in use  Urine sediment exam on 12/13: 10-20 RBCs per high-powered field none with dysmorphic features, rare renal tubular epithelial cells, rare WBCs, no muddy brown casts, no cellular casts, 1 lipid-laden cast.  Imaging: No results found.    Labs: BMET Recent Labs  Lab 08/02/23 0257 08/03/23 7846 08/04/23 0244 08/05/23 0701 08/06/23 0236 08/07/23 0227 08/08/23 0227  NA 134* 132* 132* 131* 131* 131* 129*  K 4.6 4.3 5.0 4.5 4.3 4.7 4.7  CL 82* 81* 81* 88* 91* 88* 89*  CO2 37* 37* 35* 28 31 27 28   GLUCOSE 124* 108* 114* 105* 105* 103* 113*  BUN 191* 199* 208* 147* 158* 122* 141*  CREATININE 3.42* 3.78* 4.40* 3.65* 4.01* 4.31* 4.93*  CALCIUM 10.3 10.1 10.0 9.3 9.0 9.6 9.4  PHOS 4.1  --   --   --   --   --   --    CBC Recent Labs  Lab 08/04/23 0244 08/05/23 0939 08/07/23 0227 08/08/23 0227  WBC 7.4 8.0 8.9 9.1  HGB 7.4* 7.3* 7.8* 7.0*  HCT 23.7* 24.3* 25.9* 22.9*  MCV 94.8 98.8 99.2 96.6  PLT 176 157 154 142*    Medications:     sodium chloride   Intravenous Once   calcium acetate  667 mg Oral  TID WC   Chlorhexidine Gluconate Cloth  6 each Topical Daily   darbepoetin (ARANESP) injection - DIALYSIS  60 mcg Subcutaneous Q Thu-1800   feeding supplement  237 mL Oral BID BM   finasteride  5 mg Oral Daily   guaiFENesin  600 mg Oral BID   polyethylene glycol  17 g Oral BID   rosuvastatin  10 mg Oral Daily   tamsulosin  0.4 mg Oral Daily   Mohammed Mcandrew  08/08/2023, 9:56 AM

## 2023-08-09 ENCOUNTER — Inpatient Hospital Stay: Payer: Medicare PPO | Admitting: Internal Medicine

## 2023-08-09 ENCOUNTER — Inpatient Hospital Stay (HOSPITAL_COMMUNITY): Payer: Medicare PPO

## 2023-08-09 DIAGNOSIS — E440 Moderate protein-calorie malnutrition: Secondary | ICD-10-CM

## 2023-08-09 DIAGNOSIS — I5033 Acute on chronic diastolic (congestive) heart failure: Secondary | ICD-10-CM | POA: Diagnosis not present

## 2023-08-09 HISTORY — PX: IR FLUORO GUIDE CV LINE RIGHT: IMG2283

## 2023-08-09 LAB — CBC
HCT: 25.9 % — ABNORMAL LOW (ref 39.0–52.0)
Hemoglobin: 8.2 g/dL — ABNORMAL LOW (ref 13.0–17.0)
MCH: 30.1 pg (ref 26.0–34.0)
MCHC: 31.7 g/dL (ref 30.0–36.0)
MCV: 95.2 fL (ref 80.0–100.0)
Platelets: 127 10*3/uL — ABNORMAL LOW (ref 150–400)
RBC: 2.72 MIL/uL — ABNORMAL LOW (ref 4.22–5.81)
RDW: 17.2 % — ABNORMAL HIGH (ref 11.5–15.5)
WBC: 9.3 10*3/uL (ref 4.0–10.5)
nRBC: 0.2 % (ref 0.0–0.2)

## 2023-08-09 LAB — COMPREHENSIVE METABOLIC PANEL
ALT: 38 U/L (ref 0–44)
AST: 32 U/L (ref 15–41)
Albumin: 3.1 g/dL — ABNORMAL LOW (ref 3.5–5.0)
Alkaline Phosphatase: 59 U/L (ref 38–126)
Anion gap: 15 (ref 5–15)
BUN: 86 mg/dL — ABNORMAL HIGH (ref 8–23)
CO2: 25 mmol/L (ref 22–32)
Calcium: 9.3 mg/dL (ref 8.9–10.3)
Chloride: 93 mmol/L — ABNORMAL LOW (ref 98–111)
Creatinine, Ser: 4.65 mg/dL — ABNORMAL HIGH (ref 0.61–1.24)
GFR, Estimated: 12 mL/min — ABNORMAL LOW (ref >60)
Glucose, Bld: 113 mg/dL — ABNORMAL HIGH (ref 70–99)
Potassium: 4.3 mmol/L (ref 3.5–5.1)
Sodium: 133 mmol/L — ABNORMAL LOW (ref 135–145)
Total Bilirubin: 1.2 mg/dL — ABNORMAL HIGH (ref <1.2)
Total Protein: 8.1 g/dL (ref 6.5–8.1)

## 2023-08-09 LAB — TYPE AND SCREEN
ABO/RH(D): A POS
Antibody Screen: NEGATIVE
Unit division: 0

## 2023-08-09 LAB — BPAM RBC
Blood Product Expiration Date: 202501132359
ISSUE DATE / TIME: 202412231423
Unit Type and Rh: 6200

## 2023-08-09 MED ORDER — HEPARIN SODIUM (PORCINE) 1000 UNIT/ML IJ SOLN
INTRAMUSCULAR | Status: AC
  Filled 2023-08-09: qty 10

## 2023-08-09 MED ORDER — SODIUM CHLORIDE 0.9 % IV SOLN: 125.0000 mg | INTRAVENOUS | Status: DC

## 2023-08-09 MED ORDER — FENTANYL CITRATE (PF) 100 MCG/2ML IJ SOLN
INTRAMUSCULAR | Status: AC | PRN
  Administered 2023-08-09: 50 ug via INTRAVENOUS

## 2023-08-09 MED ORDER — DARBEPOETIN ALFA 100 MCG/0.5ML IJ SOSY
100.0000 ug | PREFILLED_SYRINGE | INTRAMUSCULAR | Status: DC
  Administered 2023-08-11: 100 ug via SUBCUTANEOUS
  Filled 2023-08-09: qty 0.5

## 2023-08-09 MED ORDER — CEFAZOLIN SODIUM-DEXTROSE 2-4 GM/100ML-% IV SOLN
INTRAVENOUS | Status: AC
  Filled 2023-08-09: qty 100

## 2023-08-09 MED ORDER — CEFAZOLIN SODIUM-DEXTROSE 2-4 GM/100ML-% IV SOLN
2.0000 g | Freq: Once | INTRAVENOUS | Status: AC
  Administered 2023-08-09: 2 g via INTRAVENOUS

## 2023-08-09 MED ORDER — SODIUM CHLORIDE 0.9 % IV SOLN
125.0000 mg | INTRAVENOUS | Status: DC
  Filled 2023-08-09: qty 10

## 2023-08-09 MED ORDER — MIDAZOLAM HCL 2 MG/2ML IJ SOLN
INTRAMUSCULAR | Status: AC | PRN
  Administered 2023-08-09: 1 mg via INTRAVENOUS

## 2023-08-09 MED ORDER — LIDOCAINE-EPINEPHRINE 1 %-1:100000 IJ SOLN
INTRAMUSCULAR | Status: AC
  Filled 2023-08-09: qty 1

## 2023-08-09 MED ORDER — FENTANYL CITRATE (PF) 100 MCG/2ML IJ SOLN
INTRAMUSCULAR | Status: AC
  Filled 2023-08-09: qty 2

## 2023-08-09 MED ORDER — LIDOCAINE-EPINEPHRINE 1 %-1:100000 IJ SOLN
20.0000 mL | Freq: Once | INTRAMUSCULAR | Status: AC
  Administered 2023-08-09: 10 mL via INTRADERMAL

## 2023-08-09 MED ORDER — SODIUM CHLORIDE 0.9 % IV SOLN
INTRAVENOUS | Status: AC | PRN
  Administered 2023-08-09: 10 mL/h via INTRAVENOUS

## 2023-08-09 MED ORDER — MIDAZOLAM HCL 2 MG/2ML IJ SOLN
INTRAMUSCULAR | Status: AC
Start: 2023-08-09 — End: ?
  Filled 2023-08-09: qty 2

## 2023-08-09 NOTE — Progress Notes (Addendum)
PROGRESS NOTE    Andrew Schmitt  ZOX:[id number] DOB: Jan 12, 1951 DOA: 07/21/2023 PCP: Dorothyann Peng, MD  [name] with chronic diastolic CHF, hypertension, dyslipidemia, ascending aortic aneurysm, BPH, CKD 3b/4, chronic anemia, gout, obesity presented to the ED with progressive weakness and dyspnea x7d. In the ED,  lethargic, hypoxic, bradycardic, placed on NRM   Labs-Na 135,bun 63, cr 3,18 , BNP 183, troponin 337, 278, 309 CXR wbilateral hilar vascular congestion, bilateral pleural effusions, moderate to large on Left. CT chest, A&P: -moderate left pleural effusion with compressive atelectasis, Mild diffuse body wall edema.  -12/6: started on diuretics, Coude catheter placed for hematuria w/ CLots and increased discomfort before overt retention -12/7 more encephalopathic, placed on BiPAP, very mild respiratory acidosis, pCO2 in the 60-70 range -Complicated by worsening AKI, nephrology consulting, diuretic dose increased 12/14 persistent elevation in BUN.  12/15 resumed diuretic therapy  12/16 transitioned to oral loop diuretic therapy.  12/17 holding diuretic therapy, persistent severe BUN elevation 12/18: Gross hematuria with clots after tugging at catheter same morning, Urology consulted, started on continuous bladder irrigation, Foley replaced -12/19, BUN 200 with uremia, temp HD catheter placed, -12/21, CBI clamped -12/22, noted to be in A-fib asymptomatic -12/22 overnight CBI resumed for hematuria -12/23, transfused 1 unit PRBC on dialysis 12/24, CBI clamped, going for tunneled dialysis catheter  Subjective: Feels better overall, n.p.o. for tunneled dialysis catheter conversion  Assessment and Plan:  Acute on chronic diastolic CHF Left Pleural effusion Last echo 6/24 with EF 60-65%, moderate LVH, grade 2 DD, mildly reduced RV -Pulmonary c/s:: L Thoracentesis 12/10: exudative, cytology negative -Diuresed on IV Lasix, cards and nephrology following, down 30 LB, 21.7 L negative,  volume status has improved -Repeat echo with EF 65-70% moderate LVH, indeterminate diastolic parameters, normal RV -BUN/creatinine continued to worsen, appreciate nephrology input,  -HD catheter placed 12/19, started HD -Now euvolemic, increase activity, PT today  Transient Atrial fibrillation -New diagnosis, noted intermittently 12/21 and 12/22 -Not on anticoagulation on account of significant recurrent hematuria with clots this admission  Acute hypoxemic hyercapnic respiratory failure, due to acute cardiogenic pulmonary edema, left pleural effusion, complicated with acute metabolic encephalopathy.  12/10 SP left thoracentesis 1100 ml drained -Monitor x-rays periodically  AKI on CKD stage 3b. Hyponatremia,. ESRD -cardiorenal,  baseline creatinine of 2-2.5  -Foley catheter placed 12/6 for hematuria with clots, ? early retention, has known BPH, CT on admission with bilateral renal cysts as well -Creatinine worsening, per nephrology, diuretics held now -BUN over 200 with uremia, nephrology following, temporary dialysis catheter placed 12/19, started HD 12/19, third dialysis yesterday -IR consulting, plan for conversion to tunneled dialysis catheter today, clip for HD pending  Gross hematuria Traumatic Foley dislodgement BPH (benign prostatic hyperplasia) Right kidney lesion noted on CT Continue with flomax and proscar  -Foley catheter/bulb was dislodged after tugging 12/18 morning, this was replaced, appreciate urology input, started CBI 12/18, CBI clamped 12/20, urology following -Restarted CBI again 12/23, hematuria appears to have cleared now, will clamp CBI, attempt voiding trial and Foley catheter removal tomorrow if no further hematuria -Eventually needs workup, renal lesion noted on imaging  Anemia of chronic renal disease -Hemoglobin continues to trend down,  -Primarily from hematuria and anemia of chronic disease -Transfused 1 unit PRBC yesterday, anemia panel with iron  deficiency as well, add IV iron   Second degree AV block, Mobitz type I Continue to hold on AV blockers.  Obesity, class 2 Calculated BMI is 37,4  DVT prophylaxis: Hep SQ-> held 12/18 Code  Status: Full Code Family Communication: d/w daughter Disposition Plan: Home likely 48 hours  Consultants: Nephrology, cards, IR   Procedures: 12/19, temp dialysis catheter placement  Antimicrobials:    Objective: Vitals:   08/09/23 1000 08/09/23 1015 08/09/23 1020 08/09/23 1025  BP: 118/77 104/64 125/63 114/66  Pulse: 81 81 81 81  Resp: 20 (!) 21 20 (!) 21  Temp:      TempSrc:      SpO2: 100% 100% 100% 95%  Weight:      Height:        Intake/Output Summary (Last 24 hours) at 08/09/2023 1131 Last data filed at 08/09/2023 0449 Gross per 24 hour  Intake 3407.33 ml  Output 1351.7 ml  Net 2055.63 ml   Filed Weights   08/08/23 0854 08/08/23 1300 08/09/23 0448  Weight: 97.1 kg 95.7 kg 98.3 kg    Examination:  Gen: Frail elderly male sitting up in bed, much more alert and interactive today, oriented x 3 HEENT: No JVD, right IJ HD catheter CVS: S1-S2, regular rhythm Lungs: Clear bilaterally Abdomen: Soft, nontender, bowel sounds present  Extremities: Trace edema  GU: Foley catheter with CBI    Data Reviewed:   CBC: Recent Labs  Lab 08/04/23 0244 08/05/23 0939 08/07/23 0227 08/08/23 0227 08/09/23 0254  WBC 7.4 8.0 8.9 9.1 9.3  HGB 7.4* 7.3* 7.8* 7.0* 8.2*  HCT 23.7* 24.3* 25.9* 22.9* 25.9*  MCV 94.8 98.8 99.2 96.6 95.2  PLT 176 157 154 142* 127*   Basic Metabolic Panel: Recent Labs  Lab 08/03/23 0738 08/04/23 0244 08/05/23 0701 08/06/23 0236 08/07/23 0227 08/08/23 0227 08/09/23 0254  NA 132*   < > 131* 131* 131* 129* 133*  K 4.3   < > 4.5 4.3 4.7 4.7 4.3  CL 81*   < > 88* 91* 88* 89* 93*  CO2 37*   < > 28 31 27 28 25   GLUCOSE 108*   < > 105* 105* 103* 113* 113*  BUN 199*   < > 147* 158* 122* 141* 86*  CREATININE 3.78*   < > 3.65* 4.01* 4.31* 4.93*  4.65*  CALCIUM 10.1   < > 9.3 9.0 9.6 9.4 9.3  MG 2.6*  --   --   --   --   --   --    < > = values in this interval not displayed.   GFR: Estimated Creatinine Clearance: 15.3 mL/min (A) (by C-G formula based on SCr of 4.65 mg/dL (H)). Liver Function Tests: Recent Labs  Lab 08/09/23 0254  AST 32  ALT 38  ALKPHOS 59  BILITOT 1.2*  PROT 8.1  ALBUMIN 3.1*   No results for input(s): "LIPASE", "AMYLASE" in the last 168 hours. No results for input(s): "AMMONIA" in the last 168 hours. Coagulation Profile: No results for input(s): "INR", "PROTIME" in the last 168 hours. Cardiac Enzymes: No results for input(s): "CKTOTAL", "CKMB", "CKMBINDEX", "TROPONINI" in the last 168 hours. BNP (last 3 results) No results for input(s): "PROBNP" in the last 8760 hours. HbA1C: No results for input(s): "HGBA1C" in the last 72 hours. CBG: No results for input(s): "GLUCAP" in the last 168 hours.  Lipid Profile: No results for input(s): "CHOL", "HDL", "LDLCALC", "TRIG", "CHOLHDL", "LDLDIRECT" in the last 72 hours. Thyroid Function Tests: No results for input(s): "TSH", "T4TOTAL", "FREET4", "T3FREE", "THYROIDAB" in the last 72 hours. Anemia Panel: Recent Labs    08/07/23 0227  VITAMINB12 2,389*  FOLATE 11.9  FERRITIN 333  TIBC 357  IRON 45  RETICCTPCT 8.1*   Urine analysis:    Component Value Date/Time   COLORURINE AMBER (A) 07/29/2023 1033   APPEARANCEUR CLOUDY (A) 07/29/2023 1033   LABSPEC 1.013 07/29/2023 1033   PHURINE 5.0 07/29/2023 1033   GLUCOSEU NEGATIVE 07/29/2023 1033   HGBUR LARGE (A) 07/29/2023 1033   BILIRUBINUR NEGATIVE 07/29/2023 1033   BILIRUBINUR negative 04/09/2021 0954   KETONESUR NEGATIVE 07/29/2023 1033   PROTEINUR 100 (A) 07/29/2023 1033   UROBILINOGEN 0.2 04/09/2021 0954   NITRITE NEGATIVE 07/29/2023 1033   LEUKOCYTESUR NEGATIVE 07/29/2023 1033   Sepsis Labs: @LABRCNTIP (procalcitonin:4,lacticidven:4)  ) No results found for this or any previous visit (from  the past 240 hours).    Radiology Studies: No results found.    Scheduled Meds:  calcium acetate  667 mg Oral TID WC   Chlorhexidine Gluconate Cloth  6 each Topical Daily   [START ON 08/11/2023] darbepoetin (ARANESP) injection - DIALYSIS  100 mcg Subcutaneous Q Thu-1800   feeding supplement  237 mL Oral BID BM   finasteride  5 mg Oral Daily   rosuvastatin  10 mg Oral Daily   tamsulosin  0.4 mg Oral Daily   Continuous Infusions:  sodium chloride irrigation       LOS: 18 days    Time spent:    Zannie Cove, MD [facility]   08/09/2023, 11:31 AM

## 2023-08-09 NOTE — Progress Notes (Signed)
Patient ID: Andrew Schmitt, male   DOB: 1951-08-10, 72 y.o.   MRN: [id number] [facility] Progress Note   Assessment/ Plan:   1.  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   Continue to hold ARB and SGLT2 inhibitor for now.   With progressive azotemia and now uremia (asterixus) even after holding all diuretics started RRT 08/05/23 with RIJ Temp  Will plan for HD on TTS sched BUN finally clearing, holding off on HD today, next treatment tentatively planned for 12/26. IR to convert to [facility] today CLIP: looking into TCU. They are interested in PD down the road. Renal navigator following  2.  Acute exacerbation of HFpEF Vol status acceptable currently, holding diuretics as above 3.  Encephalopathy: stable, trend wit HD 4.  Anemia: Secondary to chronic disease including chronic kidney disease.  IV iron was given; CTM; ESA qThursday. S/p 1U prbc 12/23 5.  Hypertension: Blood pressure stable.  HTN meds stopped 6. Pleural effusion: s/p thora predominantly lymphocytic. Some malignancy concern. First cytology negative for malignant cells 7. Mild hyponatremia, attempting to manage with HD/UF 8. 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 Hematuria, traumatic foley removal: per urology, CBI  Subjective:    Seen and examined bedside. Tolerated HD yesterday but feels tired after. Per RN, had some blood around uretheral meatus. Open for [facility] later today Net uf 1.7L, s/p 1u prbc yesterday On CBI   Objective:   BP 113/69 (BP Location: Left Arm)   Pulse 85   Temp 98.5 F (36.9 C) (Oral)   Resp 17   Ht 5\' 6"  (1.676 m)   Wt 98.3 kg   SpO2 96%   BMI 34.98 kg/m   Intake/Output Summary (Last 24 hours) at 08/09/2023 0946 Last data filed at 08/09/2023 0449 Gross per 24 hour  Intake 3407.33 ml  Output 1351.7 ml  Net 2055.63 ml   Weight change: 0.7  kg  Physical Exam: Gen: NAD, laying flat in bed CVS: normal rate, no murmurs Resp: cta b/l Abd: Soft, obese, nontender Ext: very trace bilateral lower extremity edema  Dialysis access: R internal jugular Temp HD cath  Urine sediment exam on 12/13: 10-20 RBCs per high-powered field none with dysmorphic features, rare renal tubular epithelial cells, rare WBCs, no muddy brown casts, no cellular casts, 1 lipid-laden cast.  Imaging: No results found.    Labs: BMET Recent Labs  Lab 08/03/23 0738 08/04/23 0244 08/05/23 0701 08/06/23 0236 08/07/23 0227 08/08/23 0227 08/09/23 0254  NA 132* 132* 131* 131* 131* 129* 133*  K 4.3 5.0 4.5 4.3 4.7 4.7 4.3  CL 81* 81* 88* 91* 88* 89* 93*  CO2 37* 35* 28 31 27 28 25   GLUCOSE 108* 114* 105* 105* 103* 113* 113*  BUN 199* 208* 147* 158* 122* 141* 86*  CREATININE 3.78* 4.40* 3.65* 4.01* 4.31* 4.93* 4.65*  CALCIUM 10.1 10.0 9.3 9.0 9.6 9.4 9.3   CBC Recent Labs  Lab 08/05/23 0939 08/07/23 0227 08/08/23 0227 08/09/23 0254  WBC 8.0 8.9 9.1 9.3  HGB 7.3* 7.8* 7.0* 8.2*  HCT 24.3* 25.9* 22.9* 25.9*  MCV 98.8 99.2 96.6 95.2  PLT 157 154 142* 127*    Medications:     calcium acetate  667 mg Oral TID WC   Chlorhexidine Gluconate Cloth  6 each Topical Daily   darbepoetin (ARANESP) injection - DIALYSIS  60 mcg Subcutaneous Q  Thu-1800   feeding supplement  237 mL Oral BID BM   finasteride  5 mg Oral Daily   rosuvastatin  10 mg Oral Daily   tamsulosin  0.4 mg Oral Daily   Mozetta Murfin  08/09/2023, 9:46 AM

## 2023-08-09 NOTE — Progress Notes (Signed)
Initial Nutrition Assessment  DOCUMENTATION CODES:   Non-severe (moderate) malnutrition in context of acute illness/injury  INTERVENTION:  - Continue Ensure Enlive BID s/p NPO - Magic Cup TID s/p NPO - Education provided: Production designer, theatre/television/film for Chronic Kidney Disease" -Discussed importance of protein 2/2 inadequate appetite, loss of protein during RRT, and in working with PT/OT   NUTRITION DIAGNOSIS:  Moderate Malnutrition related to acute illness as evidenced by energy intake < 75% for > 7 days, mild muscle depletion, edema.   GOAL:  Patient will meet greater than or equal to 90% of their needs   MONITOR:  PO intake, Supplement acceptance, Weight trends  REASON FOR ASSESSMENT:  Consult Diet education  ASSESSMENT:  Presented to ED on 12/05 for evaluation r/t bradycardia, generalized weakness and SOB. Found to have acute on chronic CHF w/ L pleural effusion, AKI on CKD III, acute hypoxemic hypercapnic respiratory failure. Has started RRT.  PMH: chronic HFpEF, ascending aortic aneurysm, HTN, HLD, CKD III, gout, obesity.   Intake since admission has been poor, per his daughter. She endorses he is skipping meals or consuming <50%. Prior to admission appetite was adequate, although she reports he was not a "huge eater."  24-Hour Recall: B: tea, eggs, bacon L: [employer] salad w/ protein D: fish, starch, veg   Continue to hold diuretics, ARB, and SGLT2 inhibitor. Wt trending down due to fluid removal, which is desirable. Moderate amount of edema observed to BLEs. UBW prior to dialysis 103.2kg (11/21), per his daughter who was on the phone during visit.   Admission Wt: 115.2kg (12/5) 12/07: 110.5kg 12/9: 102.6kg 12/13: 103.6kg 12/17: 100.5kg 12/21: 96.4kg Current Wt: 98.3kg (12/24)  He is requiring more assistance with ADLs. Working with PT. Goal is for patient to discharge home with HHPT in place. Did discuss importance of protein intake as it relates to working with therapy and  preserving lean body mass as well as replenishing protein stores lost during RRT. Will add Magic Cup TID as additional offering of protein.  12/24 - permcath placement scheduled 12/20 - Started RRT 12/19 - temporary cath placement 12/10 - L thoracentesis, drained 12/7 - worsening AKI, placed on BiPAP 12/6 - Foley catheter placed 12/5 - presented to [facility] ED  Labs: Na 133 Crt 4.65 eGFR 27  Meds: calcium acetate, darbepoetin alfa    NUTRITION - FOCUSED PHYSICAL EXAM:  Flowsheet Row Most Recent Value  Orbital Region No depletion  Upper Arm Region Mild depletion  Thoracic and Lumbar Region No depletion  Buccal Region No depletion  Temple Region Mild depletion  Clavicle Bone Region Mild depletion  Clavicle and Acromion Bone Region Mild depletion  Scapular Bone Region No depletion  Dorsal Hand No depletion  Patellar Region Mild depletion  Anterior Thigh Region Mild depletion  Posterior Calf Region Mild depletion  Edema (RD Assessment) Moderate  Hair Reviewed  Eyes Reviewed  Mouth Reviewed  Skin Reviewed  Nails Reviewed       Diet Order:   Diet Order             Diet NPO time specified Except for: Sips with Meds  Diet effective midnight                   EDUCATION NEEDS:   Education needs have been addressed  Skin:  Skin Assessment: Reviewed RN Assessment  Last BM:  12/24  Height:  Ht Readings from Last 1 Encounters:  07/26/23 5\' 6"  (1.676 m)    Weight:  Wt Readings  from Last 1 Encounters:  08/09/23 98.3 kg    Ideal Body Weight:  64.5 kg  BMI:  Body mass index is 34.98 kg/m.  Estimated Nutritional Needs:   Kcal:  1900-2100kcal  Protein:  95-105g  Fluid:  <1.2L  Myrtie Cruise MS, RD, LDN Registered Dietitian Clinical Nutrition RD Inpatient Contact Info in Amion

## 2023-08-09 NOTE — Consult Note (Signed)
[facility] Liaison Consult Note    08/09/2023  Andrew Schmitt 1950-09-28 [id number]  Insurance: [employer] Medicare Choice   Primary Care Provider: Dorothyann Peng, MD with [facility] [[facility]] this provider is listed for the transition of care follow up appointments  and Transition of care calls   [facility] Liaison met patient at bedside at [facility].  Patient was downstairs receiving a temoporary HD catheter, daughter Felecia Shelling at the bedside and endorses PCP and plan to HD when returning to community.  Explained rounding visit and anticipate post hospital follow up with [facility] team.    The patient was screened for LLOS 18 days day readmission hospitalization with noted for rising to medium risk score for unplanned readmission risk 1 hospital admissions in 6 months.  The patient was assessed for potential Community Care Coordination service needs for post hospital transition for care coordination. Review of patient's electronic medical record reveals patient is currently for HD planning for community.   Plan: [facility] Liaison will continue to follow progress and disposition to asess for post hospital community care coordination/management needs.  Referral request for community care coordination: Anticipate [facility] transition team to follow up.   [facility] Community Care, Population Health does not replace or interfere with any arrangements made by the Inpatient Transition of Care team.   For questions contact:   Charlesetta Shanks, RN, BSN, CCM [place]  [facility] Liaison Direct Dial: [phone] or secure chat Email: [email or web address]

## 2023-08-09 NOTE — Progress Notes (Signed)
Physical Therapy Treatment Patient Details Name: Andrew Schmitt MRN: [id number] DOB: [birth year] Today's Date: 08/09/2023   History of Present Illness Patient is a 72 year old with acute on chronic diastolic CHF, Acute hypoxemic hyercapnic respiratory failure, acute cardiogenic pulmonary edema, left pleural effusion, metabolic encephalopathy. S/p left thoracentesis. CBI initiated on 12/18 for gross hematuria. S/p temp HD Rt IJ catheter placed 12/19.    PT Comments  Patient resting in bed at start of session having finished lunch; agreeable to mobilize with therapy. Min assist for supine>sit and pt taking extra time and use of bed features. CGA provided for sit<>stand with pt reliant on bil UE to initiate stand from EOB; pt stable once upright. As gait progressed posture flexed slightly and cues provided throughout to facilitate upright gait. Pt not forthcoming with symptoms as he fatigue and affect flat throughout gait, chair follow for safety and seated rest provided after short hallway distance. EOS pt repositioned in recliner for comfort. Will continue to progress pt as able.     If plan is discharge home, recommend the following: A little help with walking and/or transfers;A little help with bathing/dressing/bathroom;Assist for transportation;Help with stairs or ramp for entrance;Assistance with cooking/housework   Can travel by private vehicle        Equipment Recommendations  Hospital bed;BSC/3in1 (depending on progress)    Recommendations for Other Services       Precautions / Restrictions Precautions Precautions: Fall Restrictions Weight Bearing Restrictions Per Provider Order: No     Mobility  Bed Mobility Overal bed mobility: Needs Assistance Bed Mobility: Supine to Sit     Supine to sit: Min assist, Used rails     General bed mobility comments: pt reliant on bed features and taking extra time. min assist needed to fully raise trunk and scoot hips to EOB.     Transfers Overall transfer level: Needs assistance Equipment used: Rolling walker (2 wheels) Transfers: Sit to/from Stand Sit to Stand: Contact guard assist           General transfer comment: CGA for safety with rise from EOB. Pt reliant on bil UE to power up.    Ambulation/Gait Ambulation/Gait assistance: Contact guard assist Gait Distance (Feet): 30 Feet Assistive device: Rolling walker (2 wheels) Gait Pattern/deviations: Step-through pattern, Decreased stride length Gait velocity: decreased     General Gait Details: cues for posture intermittently, pt with slow cadence and low step clearance. VSS throughout; pt with flat affect and not forthcoming of fatigue or symptoms. chair follow provided for safetey.   Stairs             Wheelchair Mobility     Tilt Bed    Modified Rankin (Stroke Patients Only)       Balance Overall balance assessment: Needs assistance Sitting-balance support: Feet supported Sitting balance-Leahy Scale: Fair Sitting balance - Comments: posture flexed   Standing balance support: Bilateral upper extremity supported, During functional activity, Reliant on assistive device for balance Standing balance-Leahy Scale: Poor Standing balance comment: using rolling walker for support in standing                            Cognition Arousal: Alert Behavior During Therapy: Flat affect Overall Cognitive Status: Impaired/Different from baseline Area of Impairment: Attention, Following commands, Awareness, Safety/judgement, Problem solving                   Current Attention Level: Selective   Following  Commands: Follows one step commands consistently, Follows one step commands with increased time, Follows multi-step commands with increased time, Follows multi-step commands consistently Safety/Judgement: Decreased awareness of deficits Awareness: Emergent Problem Solving: Decreased initiation, Requires verbal cues, Slow  processing          Exercises      General Comments        Pertinent Vitals/Pain Pain Assessment Pain Assessment: No/denies pain    Home Living                          Prior Function            PT Goals (current goals can now be found in the care plan section) Acute Rehab PT Goals Patient Stated Goal: family goal to return home with family support and HHPT PT Goal Formulation: With patient Time For Goal Achievement: 08/12/23 Potential to Achieve Goals: Good Progress towards PT goals: Progressing toward goals    Frequency    Min 1X/week      PT Plan      Co-evaluation              AM-PAC PT "6 Clicks" Mobility   Outcome Measure  Help needed turning from your back to your side while in a flat bed without using bedrails?: A Little Help needed moving from lying on your back to sitting on the side of a flat bed without using bedrails?: A Little Help needed moving to and from a bed to a chair (including a wheelchair)?: A Little Help needed standing up from a chair using your arms (e.g., wheelchair or bedside chair)?: A Little Help needed to walk in hospital room?: A Little Help needed climbing 3-5 steps with a railing? : Total 6 Click Score: 16    End of Session Equipment Utilized During Treatment: Gait belt Activity Tolerance: Patient tolerated treatment well Patient left: in bed;with family/visitor present;with call bell/phone within reach Nurse Communication: Mobility status PT Visit Diagnosis: Unsteadiness on feet (R26.81);Muscle weakness (generalized) (M62.81)     Time: 7829-5621 PT Time Calculation (min) (ACUTE ONLY): 23 min  Charges:    $Gait Training: 8-22 mins $Therapeutic Activity: 8-22 mins PT General Charges $$ ACUTE PT VISIT: 1 Visit                     Wynn Maudlin, DPT Acute Rehabilitation Services Office [phone]  08/09/23 1:27 PM

## 2023-08-09 NOTE — Plan of Care (Signed)
  Problem: Nutrition: Goal: Adequate nutrition will be maintained Outcome: Progressing   Problem: Coping: Goal: Level of anxiety will decrease Outcome: Progressing   Problem: Safety: Goal: Ability to remain free from injury will improve Outcome: Progressing   Problem: Skin Integrity: Goal: Risk for impaired skin integrity will decrease Outcome: Progressing   Problem: Activity: Goal: Capacity to carry out activities will improve Outcome: Progressing   Problem: Cardiac: Goal: Ability to achieve and maintain adequate cardiopulmonary perfusion will improve Outcome: Progressing

## 2023-08-10 DIAGNOSIS — N401 Enlarged prostate with lower urinary tract symptoms: Secondary | ICD-10-CM | POA: Diagnosis not present

## 2023-08-10 DIAGNOSIS — I5033 Acute on chronic diastolic (congestive) heart failure: Secondary | ICD-10-CM | POA: Diagnosis not present

## 2023-08-10 DIAGNOSIS — I441 Atrioventricular block, second degree: Secondary | ICD-10-CM | POA: Diagnosis not present

## 2023-08-10 DIAGNOSIS — N179 Acute kidney failure, unspecified: Secondary | ICD-10-CM | POA: Diagnosis not present

## 2023-08-10 LAB — CBC
HCT: 26.1 % — ABNORMAL LOW (ref 39.0–52.0)
Hemoglobin: 8.2 g/dL — ABNORMAL LOW (ref 13.0–17.0)
MCH: 30 pg (ref 26.0–34.0)
MCHC: 31.4 g/dL (ref 30.0–36.0)
MCV: 95.6 fL (ref 80.0–100.0)
Platelets: 114 10*3/uL — ABNORMAL LOW (ref 150–400)
RBC: 2.73 MIL/uL — ABNORMAL LOW (ref 4.22–5.81)
RDW: 16.6 % — ABNORMAL HIGH (ref 11.5–15.5)
WBC: 6.9 10*3/uL (ref 4.0–10.5)
nRBC: 0 % (ref 0.0–0.2)

## 2023-08-10 LAB — COMPREHENSIVE METABOLIC PANEL
ALT: 26 U/L (ref 0–44)
AST: 29 U/L (ref 15–41)
Albumin: 3 g/dL — ABNORMAL LOW (ref 3.5–5.0)
Alkaline Phosphatase: 55 U/L (ref 38–126)
Anion gap: 14 (ref 5–15)
BUN: 98 mg/dL — ABNORMAL HIGH (ref 8–23)
CO2: 24 mmol/L (ref 22–32)
Calcium: 9.3 mg/dL (ref 8.9–10.3)
Chloride: 94 mmol/L — ABNORMAL LOW (ref 98–111)
Creatinine, Ser: 5.57 mg/dL — ABNORMAL HIGH (ref 0.61–1.24)
GFR, Estimated: 10 mL/min — ABNORMAL LOW (ref >60)
Glucose, Bld: 107 mg/dL — ABNORMAL HIGH (ref 70–99)
Potassium: 4.2 mmol/L (ref 3.5–5.1)
Sodium: 132 mmol/L — ABNORMAL LOW (ref 135–145)
Total Bilirubin: 0.9 mg/dL (ref <1.2)
Total Protein: 7.8 g/dL (ref 6.5–8.1)

## 2023-08-10 MED ORDER — SIMETHICONE 80 MG PO CHEW
80.0000 mg | CHEWABLE_TABLET | Freq: Four times a day (QID) | ORAL | Status: DC | PRN
  Administered 2023-08-10 – 2023-08-16 (×2): 80 mg via ORAL
  Filled 2023-08-10 (×2): qty 1

## 2023-08-10 NOTE — Progress Notes (Signed)
Pt c/o abdominal discomfort feels like its gas, pt had small bowel movement. Pt's urine is also observed to be bloody. Dr. Arville Care on call hospitalist made aware and ordered simethicon prn and resume CBI until clears. Orders carried out.

## 2023-08-10 NOTE — Progress Notes (Addendum)
Patient ID: Andrew Schmitt, male   DOB: 1951/08/13, 72 y.o.   MRN: [id number] [facility] Progress Note   Assessment/ Plan:   1.  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   Continue to hold ARB and SGLT2 inhibitor for now.   With progressive azotemia and now uremia (asterixus) even after holding all diuretics started RRT 08/05/23 with RIJ Temp  S/p right [facility] 08/09/23 with IR Will plan for HD on TTS sched, next HD 12/26 CLIP: looking into TCU. They are interested in PD down the road. Renal navigator following  2.  Acute exacerbation of HFpEF Vol status acceptable/stable currently, holding diuretics as above, UF as tolerated 3.  Encephalopathy: stable, trend wit HD 4.  Anemia: Secondary to chronic disease including chronic kidney disease.  IV iron was given; CTM; ESA qThursday--dose inc'd. S/p 1U prbc 12/23 5.  Hypertension: Blood pressure stable.  HTN meds stopped 6. Pleural effusion: s/p thora predominantly lymphocytic. Some malignancy concern. First cytology negative for malignant cells 7. Mild hyponatremia, attempting to manage with HD/UF 8. 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 Hematuria, traumatic foley removal: per urology, CBI  Subjective:    Seen and examined bedside. No acute events, no complaints. Discussed with nephew at bedside.   Objective:   BP 115/77 (BP Location: Right Arm)   Pulse 76   Temp 98.7 F (37.1 C) (Oral)   Resp 20   Ht 5\' 6"  (1.676 m)   Wt 93.8 kg   SpO2 96%   BMI 33.38 kg/m   Intake/Output Summary (Last 24 hours) at 08/10/2023 6295 Last data filed at 08/09/2023 2348 Gross per 24 hour  Intake --  Output 100 ml  Net -100 ml   Weight change: -3.296 kg  Physical Exam: Gen: NAD, laying flat in bed/resting comfortably CVS: normal rate, no murmurs Resp: cta b/l Abd: Soft, obese,  nontender Ext: very trace bilateral lower extremity edema  Dialysis access: Rt TDC  Urine sediment exam on 12/13: 10-20 RBCs per high-powered field none with dysmorphic features, rare renal tubular epithelial cells, rare WBCs, no muddy brown casts, no cellular casts, 1 lipid-laden cast.  Imaging: IR Fluoro Guide CV Line Right Result Date: 08/09/2023 INDICATION: ESRD requiring HD. Prior VIR-placed non tunneled dialysis catheter placed 08/04/2023. EXAM: TUNNELED CENTRAL VENOUS HEMODIALYSIS CATHETER PLACEMENT WITH ULTRASOUND AND FLUOROSCOPIC GUIDANCE MEDICATIONS: Ancef 2 gm IV . The antibiotic was given in an appropriate time interval prior to skin puncture. ANESTHESIA/SEDATION: Moderate (conscious) sedation was employed during this procedure. A total of Versed 1 mg and Fentanyl 50 mcg was administered intravenously. Moderate Sedation Time: 10 minutes. The patient's level of consciousness and vital signs were monitored continuously by radiology nursing throughout the procedure under my direct supervision. FLUOROSCOPY TIME:  Fluoroscopic dose; 5 mGy COMPLICATIONS: None immediate. PROCEDURE: Informed written consent was obtained from the the patient and/or patient's representative after a discussion of the risks, benefits, and alternatives to treatment. Questions regarding the procedure were encouraged and answered. The RIGHT neck and chest were prepped with chlorhexidine in a sterile fashion, and a sterile drape was applied covering the operative field. Maximum barrier sterile technique with sterile gowns and gloves were used for the procedure. A timeout was performed prior to the initiation of the procedure. A stiff Glidewire was advanced into the pre-existing non tunneled catheter to the level of the IVC and the temporary catheter  was removed. A palindrome tunneled hemodialysis catheter measuring 19 cm from tip to cuff was tunneled in a retrograde fashion from the anterior chest wall to the venotomy incision.  The catheter was then placed through the peel-away sheath with tips ultimately positioned within the superior aspect of the right atrium. Final catheter positioning was confirmed and documented with a spot radiographic image. The catheter aspirates and flushes normally. The catheter was flushed with appropriate volume heparin dwells. The catheter exit site was secured with a 2-0 Ethilon retention suture. The venotomy incision was closed with Dermabond. Dressings were applied. The patient tolerated the procedure well without immediate post procedural complication. IMPRESSION: Successful placement of 19 cm tip to cuff tunneled hemodialysis catheter via the RIGHT internal jugular vein The tip of the catheter is positioned at the superior cavo-atrial junction. The catheter is ready for immediate use. Roanna Banning, MD [facility] Radiology Electronically Signed   By: Roanna Banning M.D.   On: 08/09/2023 16:49      Labs: BMET Recent Labs  Lab 08/04/23 0244 08/05/23 0701 08/06/23 0236 08/07/23 0227 08/08/23 0227 08/09/23 0254 08/10/23 0244  NA 132* 131* 131* 131* 129* 133* 132*  K 5.0 4.5 4.3 4.7 4.7 4.3 4.2  CL 81* 88* 91* 88* 89* 93* 94*  CO2 35* 28 31 27 28 25 24   GLUCOSE 114* 105* 105* 103* 113* 113* 107*  BUN 208* 147* 158* 122* 141* 86* 98*  CREATININE 4.40* 3.65* 4.01* 4.31* 4.93* 4.65* 5.57*  CALCIUM 10.0 9.3 9.0 9.6 9.4 9.3 9.3   CBC Recent Labs  Lab 08/07/23 0227 08/08/23 0227 08/09/23 0254 08/10/23 0244  WBC 8.9 9.1 9.3 6.9  HGB 7.8* 7.0* 8.2* 8.2*  HCT 25.9* 22.9* 25.9* 26.1*  MCV 99.2 96.6 95.2 95.6  PLT 154 142* 127* 114*    Medications:     calcium acetate  667 mg Oral TID WC   Chlorhexidine Gluconate Cloth  6 each Topical Daily   [START ON 08/11/2023] darbepoetin (ARANESP) injection - DIALYSIS  100 mcg Subcutaneous Q Thu-1800   feeding supplement  237 mL Oral BID BM   finasteride  5 mg Oral Daily   rosuvastatin  10  mg Oral Daily   tamsulosin  0.4 mg Oral Daily   Sherese Heyward  08/10/2023, 8:35 AM

## 2023-08-10 NOTE — Assessment & Plan Note (Signed)
Continue with nutritional supplements.  

## 2023-08-10 NOTE — Progress Notes (Signed)
Progress Note   Patient: Andrew Schmitt:[id number] DOB: January 26, 1951 DOA: 07/21/2023     19 DOS: the patient was seen and examined on 08/10/2023   Brief hospital course: Mr. Elvidge was admitted to the hospital with the working diagnosis of acute heart failure decompensation.   [name] with chronic diastolic CHF, hypertension, dyslipidemia, ascending aortic aneurysm, BPH, CKD 3b/4, chronic anemia, gout, obesity presented to the ED with progressive weakness and shortness of breath for 7 days prior to admission. Worsening dyspnea on exertion, and lower extremity edema. Severe symptoms to the point where he was dyspneic with minimal efforts. EMS was called, he was found lethargic and bradycardic, he was placed on supplemental 02 per non re-breather mask and was transported to the ED.  On his initial ED physical examination his blood pressure was 118/66, HR 65, RR 22 and 02 saturation 100% on supplemental 02. Lungs with no wheezing or rhonchi, increased work of breathing, heart with S1 and S2 present and regular, with no gallops, or rubs, abdomen with no distention and positive lower extremity edema   Na 135, K 4.5 Cl 101, bicarbonate 25, glucose 128, bun 63, cr 3,18  Mg 2.9  BNP 183  High sensitive troponin 337, 278, 309 Wbc 4.3 hgb 10.4 plt 132  Urine analysis SG 1.009. protein 100, negative leukocytes, moderate hgb, RBC >50, wbc 6-10   Chest radiograph with cardiomegaly, bilateral hilar vascular congestion, bilateral pleural effusions, moderate to large on the left and small on the right.  CT chest, abdomen and pelvis with no aneurysm identified, moderate left pleural effusion and small right pleural effusion with compressive atelectasis on the left lower lobe and portion of the left upper lobe.  Mild bilateral perinephric stranding.  Mild diffuse body wall edema.  Bilateral renal cysts.  EKG 59 bpm, right axis deviation, qtc 510, sinus rhythm with first degree AV block, one non conducting P  wave, poor R R wave progression, with no significant ST segment or T wave changes.    -12/6: started on diuretics, Coude catheter placed for hematuria w/ CLots and increased discomfort before overt retention -12/7 more encephalopathic, placed on BiPAP, very mild respiratory acidosis, pCO2 in the 60-70 range -Complicated by worsening AKI, nephrology consulting, diuretic dose increased  12/13 improved volume status, but renal function not yet stable.  12/14 persistent elevation in BUN.  12/15 resumed diuretic therapy  12/16 improved volume status and transitioned to oral loop diuretic therapy.  12/17 holding diuretic therapy, continue to have severe BUM elevation, may need renal replacement therapy.  12/18: Gross hematuria with clots after tugging at catheter same morning, Urology consulted, started on continuous bladder irrigation, Foley replaced -12/19, BUN 200 with uremia, temp HD catheter placed, -12/21, CBI clamped -12/22, noted to be in A-fib asymptomatic -12/22 overnight CBI resumed for hematuria -12/23, transfused 1 unit PRBC on dialysis 12/24, CBI clamped, going for tunneled dialysis catheter   Assessment and Plan: * Acute on chronic diastolic CHF (congestive heart failure) (HCC) Echocardiogram with preserved LV systolic function with EF 65 to 70%, moderate LVH. RV systolic function preserved, LA with moderate dilatation, no significant valvular disease.    Patient failed diuretic therapy and has been transitioned to renal replacement therapy with ultrafiltration with good toleration.  His volume status has improved.   Acute hypoxemic hyercapnic respiratory failure, due to acute cardiogenic pulmonary edema, left pleural effusion, complicated with acute metabolic encephalopathy.  12/10 SP left thoracentesis 1100 ml removed.   Metabolic encephalopathy has resolved.  Acute kidney injury superimposed on chronic kidney disease (HCC) CKD stage 3b to IV. Hyponatremia,.  Patient  with progressive azotemia and uremia, required renal replacement therapy.  12/20 right internal jugular temporal catheter,  12/23 tunneled HD cathter  Continue renal replacement therapy TTS.  Pending outpatient CLIP.    Continue with renal diet and P binders  Patient had traumatic hematuria, required CBI.  Follow up with urology recommendations.   Anemia of chronic renal disease, 12/23 PRBC transfusion x1   Second degree AV block, Mobitz type I Continue to hold on AV blockers. Continue telemetry.  BPH (benign prostatic hyperplasia) Continue with flomax and proscar  Bladder catheter in place.   Obesity, class 2 Calculated BMI is 37,4  Malnutrition of moderate degree Continue with nutritional supplements.         Subjective: Patient is feeling better, improved dyspnea and edema, continue very weak and deconditioned   Physical Exam: Vitals:   08/09/23 2348 08/10/23 0342 08/10/23 0911 08/10/23 1300  BP: 107/65 115/77 110/67 125/61  Pulse: 71 76 72 72  Resp: 19 20 20  (!) 24  Temp: 98.4 F (36.9 C) 98.7 F (37.1 C) 98.4 F (36.9 C) 98.1 F (36.7 C)  TempSrc: Oral Oral Oral Oral  SpO2: 96% 96% 98% 97%  Weight:  93.8 kg    Height:       Neurology awake and alert ENT with mild pallor Cardiovascular with S1 and S2 present and regular with no gallops, rubs or murmurs Respiratory with no rales or wheezing Abdomen with no distention  Trace lower extremity edema  Data Reviewed:    Family Communication: I spoke with patient's family at the bedside, we talked in detail about patient's condition, plan of care and prognosis and all questions were addressed.   Disposition: Status is: Inpatient Remains inpatient appropriate because: pending outpatient HD CLIPS  Planned Discharge Destination:  to be determined      Author: Coralie Keens, MD 08/10/2023 3:43 PM  For on call review [email or web address].

## 2023-08-10 NOTE — Plan of Care (Signed)
  Problem: Education: Goal: Knowledge of General Education information will improve Description: Including pain rating scale, medication(s)/side effects and non-pharmacologic comfort measures Outcome: Progressing   Problem: Health Behavior/Discharge Planning: Goal: Ability to manage health-related needs will improve Outcome: Progressing   Problem: Clinical Measurements: Goal: Ability to maintain clinical measurements within normal limits will improve Outcome: Progressing   Problem: Activity: Goal: Risk for activity intolerance will decrease Outcome: Progressing   Problem: Nutrition: Goal: Adequate nutrition will be maintained Outcome: Progressing   Problem: Elimination: Goal: Will not experience complications related to urinary retention Outcome: Progressing   Problem: Pain Management: Goal: General experience of comfort will improve Outcome: Progressing   Problem: Education: Goal: Ability to demonstrate management of disease process will improve Outcome: Progressing   Problem: Activity: Goal: Capacity to carry out activities will improve Outcome: Progressing   Problem: Activity: Goal: Activity intolerance will improve Outcome: Progressing

## 2023-08-11 DIAGNOSIS — I5033 Acute on chronic diastolic (congestive) heart failure: Secondary | ICD-10-CM | POA: Diagnosis not present

## 2023-08-11 HISTORY — PX: IR US GUIDE VASC ACCESS RIGHT: IMG2390

## 2023-08-11 LAB — CBC
HCT: 26.3 % — ABNORMAL LOW (ref 39.0–52.0)
Hemoglobin: 8.4 g/dL — ABNORMAL LOW (ref 13.0–17.0)
MCH: 30.2 pg (ref 26.0–34.0)
MCHC: 31.9 g/dL (ref 30.0–36.0)
MCV: 94.6 fL (ref 80.0–100.0)
Platelets: 125 10*3/uL — ABNORMAL LOW (ref 150–400)
RBC: 2.78 MIL/uL — ABNORMAL LOW (ref 4.22–5.81)
RDW: 15.9 % — ABNORMAL HIGH (ref 11.5–15.5)
WBC: 6.2 10*3/uL (ref 4.0–10.5)
nRBC: 0 % (ref 0.0–0.2)

## 2023-08-11 LAB — RENAL FUNCTION PANEL
Albumin: 2.9 g/dL — ABNORMAL LOW (ref 3.5–5.0)
Anion gap: 16 — ABNORMAL HIGH (ref 5–15)
BUN: 115 mg/dL — ABNORMAL HIGH (ref 8–23)
CO2: 22 mmol/L (ref 22–32)
Calcium: 8.9 mg/dL (ref 8.9–10.3)
Chloride: 92 mmol/L — ABNORMAL LOW (ref 98–111)
Creatinine, Ser: 5.66 mg/dL — ABNORMAL HIGH (ref 0.61–1.24)
GFR, Estimated: 10 mL/min — ABNORMAL LOW (ref >60)
Glucose, Bld: 103 mg/dL — ABNORMAL HIGH (ref 70–99)
Phosphorus: 5.6 mg/dL — ABNORMAL HIGH (ref 2.5–4.6)
Potassium: 4 mmol/L (ref 3.5–5.1)
Sodium: 130 mmol/L — ABNORMAL LOW (ref 135–145)

## 2023-08-11 MED ORDER — HYDROXYZINE HCL 10 MG PO TABS: 10.0000 mg | ORAL_TABLET | Freq: Three times a day (TID) | ORAL | Status: DC | PRN

## 2023-08-11 MED ORDER — HEPARIN SODIUM (PORCINE) 1000 UNIT/ML IJ SOLN
3200.0000 [IU] | INTRAMUSCULAR | Status: DC | PRN
Start: 2023-08-11 — End: 2023-08-17
  Administered 2023-08-11 – 2023-08-16 (×3): 3200 [IU]
  Filled 2023-08-11 (×3): qty 4

## 2023-08-11 MED ORDER — IRON SUCROSE 200 MG IVPB - SIMPLE MED
200.0000 mg | Status: DC
  Administered 2023-08-11 – 2023-08-16 (×3): 200 mg via INTRAVENOUS
  Filled 2023-08-11: qty 110
  Filled 2023-08-11: qty 200
  Filled 2023-08-11: qty 110
  Filled 2023-08-11 (×2): qty 200

## 2023-08-11 NOTE — Progress Notes (Signed)
PROGRESS NOTE    Andrew Schmitt  NFA:[id number] DOB: October 30, 1950 DOA: 07/21/2023 PCP: Dorothyann Peng, MD  Chief Complaint  Patient presents with   Bradycardia   Shortness of Breath   Fatigue    Brief Narrative:   Andrew Schmitt was admitted to the hospital with the working diagnosis of acute heart failure decompensation.    [name] with chronic diastolic CHF, hypertension, dyslipidemia, ascending aortic aneurysm, BPH, CKD 3b/4, chronic anemia, gout, obesity presented to the ED with progressive weakness and shortness of breath for 7 days prior to admission. Worsening dyspnea on exertion, and lower extremity edema. Severe symptoms to the point where he was dyspneic with minimal efforts. EMS was called, he was found lethargic and bradycardic, he was placed on supplemental 02 per non re-breather mask and was transported to the ED.  On his initial ED physical examination his blood pressure was 118/66, HR 65, RR 22 and 02 saturation 100% on supplemental 02. Lungs with no wheezing or rhonchi, increased work of breathing, heart with S1 and S2 present and regular, with no gallops, or rubs, abdomen with no distention and positive lower extremity edema    Na 135, K 4.5 Cl 101, bicarbonate 25, glucose 128, bun 63, cr 3,18  Mg 2.9  BNP 183  High sensitive troponin 337, 278, 309 Wbc 4.3 hgb 10.4 plt 132  Urine analysis SG 1.009. protein 100, negative leukocytes, moderate hgb, RBC >50, wbc 6-10    Chest radiograph with cardiomegaly, bilateral hilar vascular congestion, bilateral pleural effusions, moderate to large on the left and small on the right.  CT chest, abdomen and pelvis with no aneurysm identified, moderate left pleural effusion and small right pleural effusion with compressive atelectasis on the left lower lobe and portion of the left upper lobe.  Mild bilateral perinephric stranding.  Mild diffuse body wall edema.  Bilateral renal cysts.   EKG 59 bpm, right axis deviation, qtc 510, sinus  rhythm with first degree AV block, one non conducting P wave, poor R R wave progression, with no significant ST segment or T wave changes.      -12/6: started on diuretics, Coude catheter placed for hematuria w/ CLots and increased discomfort before overt retention -12/7 more encephalopathic, placed on BiPAP, very mild respiratory acidosis, pCO2 in the 60-70 range -Complicated by worsening AKI, nephrology consulting, diuretic dose increased   12/13 improved volume status, but renal function not yet stable.  12/14 persistent elevation in BUN.  12/15 resumed diuretic therapy  12/16 improved volume status and transitioned to oral loop diuretic therapy.  12/17 holding diuretic therapy, continue to have severe BUM elevation, may need renal replacement therapy.  12/18: Gross hematuria with clots after tugging at catheter same morning, Urology consulted, started on continuous bladder irrigation, Foley replaced -12/19, BUN 200 with uremia, temp HD catheter placed, -12/21, CBI clamped -12/22, noted to be in Andrew Schmitt asymptomatic -12/22 overnight CBI resumed for hematuria -12/23, transfused 1 unit PRBC on dialysis 12/24, CBI clamped, going for tunneled dialysis catheter    Assessment & Plan:   Principal Problem:   Acute on chronic diastolic CHF (congestive heart failure) (HCC) Active Problems:   Acute kidney injury superimposed on chronic kidney disease (HCC)   Second degree AV block, Mobitz type I   BPH (benign prostatic hyperplasia)   Obesity, class 2   Malnutrition of moderate degree  Acute on chronic diastolic CHF (congestive heart failure) (HCC) Echo with EF 65-70%, no RWMA, moderate LVH Volume per renal  Acute hypoxemic hyercapnic respiratory failure, due to acute cardiogenic pulmonary edema, left pleural effusion  12/10 SP left thoracentesis 1100 ml removed.    Metabolic encephalopathy has resolved.    Acute kidney injury superimposed on chronic kidney disease IIIb/IV Patient  with progressive azotemia and uremia, required renal replacement therapy.  Planning for HD TTS Per renal, appreciate assistance   Patient had traumatic hematuria, required CBI.  Follow up with urology recommendations.  Foley remains in place   Anemia of chronic renal disease, 12/23 PRBC transfusion x1    Second degree AV block, Mobitz type I Continue to hold on AV blockers. Continue telemetry.   BPH (benign prostatic hyperplasia) Continue with flomax and proscar  Bladder catheter in place.    Obesity, class 2 Body mass index is 35.9 kg/m.   Malnutrition of moderate degree Continue with nutritional supplements.      DVT prophylaxis: SCD Code Status: full Family Communication: none Disposition:   Status is: Inpatient Remains inpatient appropriate because: need for continued inpatient care   Consultants:  Renal   Procedures:  Echo IMPRESSIONS     1. Left ventricular ejection fraction, by estimation, is 65 to 70%. The  left ventricle has normal function. The left ventricle has no regional  wall motion abnormalities. There is moderate left ventricular hypertrophy.  Left ventricular diastolic  parameters are indeterminate.   2. Right ventricular systolic function is normal. The right ventricular  size is normal.   3. Left atrial size was moderately dilated.   4. The mitral valve is abnormal. Trivial mitral valve regurgitation. No  evidence of mitral stenosis.   5. The aortic valve is tricuspid. There is mild calcification of the  aortic valve. Aortic valve regurgitation is not visualized. Aortic valve  sclerosis is present, with no evidence of aortic valve stenosis.   6. The inferior vena cava is normal in size with greater than 50%  respiratory variability, suggesting right atrial pressure of 3 mmHg.   12/10 thoracentesis  Antimicrobials:  Anti-infectives (From admission, onward)    Start     Dose/Rate Route Frequency Ordered Stop   08/09/23 1030   ceFAZolin (ANCEF) IVPB 2g/100 mL premix        2 g 200 mL/hr over 30 Minutes Intravenous  Once 08/09/23 0943 08/10/23 0949       Subjective: C/o itching to penis  Objective: Vitals:   08/11/23 1630 08/11/23 1700 08/11/23 1730 08/11/23 1755  BP: 103/72 111/74 107/74 115/81  Pulse: 77 75 73 76  Resp: (!) 21 (!) 21 20 17   Temp:    97.7 F (36.5 C)  TempSrc:    Oral  SpO2: 100% 100% 100% 100%  Weight:      Height:        Intake/Output Summary (Last 24 hours) at 08/11/2023 1808 Last data filed at 08/11/2023 1610 Gross per 24 hour  Intake 560 ml  Output 1200 ml  Net -640 ml   Filed Weights   08/10/23 0342 08/11/23 0649 08/11/23 1408  Weight: 93.8 kg 99.1 kg 100.9 kg    Examination:  General exam: Appears calm and comfortable  Respiratory system: unlabored Cardiovascular system:RRR Gastrointestinal system: Abdomen is nondistended, soft and nontender GU: some bleeding around foley, no significant penile lesions  Central nervous system: Alert and oriented. No focal neurological deficits. Extremities: no LEE  Data Reviewed: I have personally reviewed following labs and imaging studies  CBC: Recent Labs  Lab 08/07/23 0227 08/08/23 0227 08/09/23 0254 08/10/23 0244 08/11/23 1304  WBC 8.9 9.1 9.3 6.9 6.2  HGB 7.8* 7.0* 8.2* 8.2* 8.4*  HCT 25.9* 22.9* 25.9* 26.1* 26.3*  MCV 99.2 96.6 95.2 95.6 94.6  PLT 154 142* 127* 114* 125*    Basic Metabolic Panel: Recent Labs  Lab 08/07/23 0227 08/08/23 0227 08/09/23 0254 08/10/23 0244 08/11/23 0240  NA 131* 129* 133* 132* 130*  K 4.7 4.7 4.3 4.2 4.0  CL 88* 89* 93* 94* 92*  CO2 27 28 25 24 22   GLUCOSE 103* 113* 113* 107* 103*  BUN 122* 141* 86* 98* 115*  CREATININE 4.31* 4.93* 4.65* 5.57* 5.66*  CALCIUM 9.6 9.4 9.3 9.3 8.9  PHOS  --   --   --   --  5.6*    GFR: Estimated Creatinine Clearance: 12.7 mL/min (Alma Mohiuddin) (by C-G formula based on SCr of 5.66 mg/dL (H)).  Liver Function Tests: Recent Labs  Lab  08/09/23 0254 08/10/23 0244 08/11/23 0240  AST 32 29  --   ALT 38 26  --   ALKPHOS 59 55  --   BILITOT 1.2* 0.9  --   PROT 8.1 7.8  --   ALBUMIN 3.1* 3.0* 2.9*    CBG: No results for input(s): "GLUCAP" in the last 168 hours.   No results found for this or any previous visit (from the past 240 hours).       Radiology Studies: No results found.      Scheduled Meds:  calcium acetate  667 mg Oral TID WC   Chlorhexidine Gluconate Cloth  6 each Topical Daily   darbepoetin (ARANESP) injection - DIALYSIS  100 mcg Subcutaneous Q Thu-1800   feeding supplement  237 mL Oral BID BM   finasteride  5 mg Oral Daily   rosuvastatin  10 mg Oral Daily   tamsulosin  0.4 mg Oral Daily   Continuous Infusions:  heparin sodium (porcine)     iron sucrose 200 mg (08/11/23 1319)   sodium chloride irrigation       LOS: 20 days    Time spent: over 30 min    Lacretia Nicks, MD [facility]   To contact the attending provider between 7A-7P or the covering provider during after hours 7P-7A, please log into the web site [email or web address] and access using universal [place] password for that web site. If you do not have the password, please call the hospital operator.  08/11/2023, 6:08 PM

## 2023-08-11 NOTE — Progress Notes (Signed)
Patient ID: Andrew Schmitt, male   DOB: 04-28-1951, 72 y.o.   MRN: [id number] [facility] Progress Note   Assessment/ Plan:   1.  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   Continue to hold ARB and SGLT2 inhibitor for now.   With progressive azotemia and now uremia (asterixus) even after holding all diuretics therefore started RRT 08/05/23 with RIJ Temp  S/p right [facility] 08/09/23 with IR Will plan for HD on TTS sched, next HD 12/26-today CLIP: looking into TCU. They are interested in PD down the road (holding off on [facility] consult for perm access). Renal navigator following  2.  Acute exacerbation of HFpEF Vol status acceptable/stable currently, holding diuretics as above, UF as tolerated 3.  Encephalopathy: stable, trend wit HD 4.  Anemia: Secondary to chronic disease including chronic kidney disease.  IV iron was given; CTM; ESA qThursday--dose inc'd. S/p 1U prbc 12/23 5.  Hypertension: Blood pressure stable.  HTN meds stopped 6. Pleural effusion: s/p thora predominantly lymphocytic. Some malignancy concern. First cytology negative for malignant cells 7. Mild hyponatremia, attempting to manage with HD/UF 8. 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 Hematuria, traumatic foley removal: per urology, CBI  Subjective:    Seen and examined bedside. No acute events, no complaints. Just finished eating breakfast   Objective:   BP 114/71 (BP Location: Right Arm)   Pulse 77   Temp 98.1 F (36.7 C) (Oral)   Resp 20   Ht 5\' 6"  (1.676 m)   Wt 99.1 kg   SpO2 99%   BMI 35.26 kg/m   Intake/Output Summary (Last 24 hours) at 08/11/2023 7829 Last data filed at 08/11/2023 0515 Gross per 24 hour  Intake 980 ml  Output 1400 ml  Net -420 ml   Weight change: 5.296 kg  Physical Exam: Gen: NAD, sitting up at edge of bed CVS: normal rate, no  murmurs Resp: cta b/l Abd: Soft, obese, nontender Ext: very trace bilateral lower extremity edema  Dialysis access: Rt TDC  Urine sediment exam on 12/13: 10-20 RBCs per high-powered field none with dysmorphic features, rare renal tubular epithelial cells, rare WBCs, no muddy brown casts, no cellular casts, 1 lipid-laden cast.  Imaging: IR Fluoro Guide CV Line Right Result Date: 08/09/2023 INDICATION: ESRD requiring HD. Prior VIR-placed non tunneled dialysis catheter placed 08/04/2023. EXAM: TUNNELED CENTRAL VENOUS HEMODIALYSIS CATHETER PLACEMENT WITH ULTRASOUND AND FLUOROSCOPIC GUIDANCE MEDICATIONS: Ancef 2 gm IV . The antibiotic was given in an appropriate time interval prior to skin puncture. ANESTHESIA/SEDATION: Moderate (conscious) sedation was employed during this procedure. A total of Versed 1 mg and Fentanyl 50 mcg was administered intravenously. Moderate Sedation Time: 10 minutes. The patient's level of consciousness and vital signs were monitored continuously by radiology nursing throughout the procedure under my direct supervision. FLUOROSCOPY TIME:  Fluoroscopic dose; 5 mGy COMPLICATIONS: None immediate. PROCEDURE: Informed written consent was obtained from the the patient and/or patient's representative after a discussion of the risks, benefits, and alternatives to treatment. Questions regarding the procedure were encouraged and answered. The RIGHT neck and chest were prepped with chlorhexidine in a sterile fashion, and a sterile drape was applied covering the operative field. Maximum barrier sterile technique with sterile gowns and gloves were used for the procedure. A timeout was performed prior to the initiation of the procedure. A stiff Glidewire was advanced into the pre-existing non tunneled catheter  to the level of the IVC and the temporary catheter was removed. A palindrome tunneled hemodialysis catheter measuring 19 cm from tip to cuff was tunneled in a retrograde fashion from the  anterior chest wall to the venotomy incision. The catheter was then placed through the peel-away sheath with tips ultimately positioned within the superior aspect of the right atrium. Final catheter positioning was confirmed and documented with a spot radiographic image. The catheter aspirates and flushes normally. The catheter was flushed with appropriate volume heparin dwells. The catheter exit site was secured with a 2-0 Ethilon retention suture. The venotomy incision was closed with Dermabond. Dressings were applied. The patient tolerated the procedure well without immediate post procedural complication. IMPRESSION: Successful placement of 19 cm tip to cuff tunneled hemodialysis catheter via the RIGHT internal jugular vein The tip of the catheter is positioned at the superior cavo-atrial junction. The catheter is ready for immediate use. Roanna Banning, MD [facility] Electronically Signed   By: Roanna Banning M.D.   On: 08/09/2023 16:49      Labs: BMET Recent Labs  Lab 08/05/23 0701 08/06/23 0236 08/07/23 0227 08/08/23 0227 08/09/23 0254 08/10/23 0244 08/11/23 0240  NA 131* 131* 131* 129* 133* 132* 130*  K 4.5 4.3 4.7 4.7 4.3 4.2 4.0  CL 88* 91* 88* 89* 93* 94* 92*  CO2 28 31 27 28 25 24 22   GLUCOSE 105* 105* 103* 113* 113* 107* 103*  BUN 147* 158* 122* 141* 86* 98* 115*  CREATININE 3.65* 4.01* 4.31* 4.93* 4.65* 5.57* 5.66*  CALCIUM 9.3 9.0 9.6 9.4 9.3 9.3 8.9  PHOS  --   --   --   --   --   --  5.6*   CBC Recent Labs  Lab 08/07/23 0227 08/08/23 0227 08/09/23 0254 08/10/23 0244  WBC 8.9 9.1 9.3 6.9  HGB 7.8* 7.0* 8.2* 8.2*  HCT 25.9* 22.9* 25.9* 26.1*  MCV 99.2 96.6 95.2 95.6  PLT 154 142* 127* 114*    Medications:     calcium acetate  667 mg Oral TID WC   Chlorhexidine Gluconate Cloth  6 each Topical Daily   darbepoetin (ARANESP) injection - DIALYSIS  100 mcg Subcutaneous Q Thu-1800   feeding supplement  237 mL  Oral BID BM   finasteride  5 mg Oral Daily   rosuvastatin  10 mg Oral Daily   tamsulosin  0.4 mg Oral Daily   Diamond Martucci  08/11/2023, 8:35 AM

## 2023-08-11 NOTE — Progress Notes (Signed)
Received patient in bed to unit.  Alert and oriented.  Informed consent signed and in chart.   TX duration:3.5 hours  Patient tolerated well.  Transported back to the room  Alert, without acute distress.  Hand-off given to patient's nurse.   Access used: R internal jugular HD Cath Access issues: none  Total UF removed: 2L Medication(s) given: none   08/11/23 1755  Vitals  Temp 97.7 F (36.5 C)  Temp Source Oral  BP 115/81  MAP (mmHg) 91  BP Location Right Arm  BP Method Automatic  Patient Position (if appropriate) Lying  Pulse Rate 76  Pulse Rate Source Monitor  ECG Heart Rate 73  Resp 17  Oxygen Therapy  SpO2 100 %  O2 Device Nasal Cannula  O2 Flow Rate (L/min) 2 L/min  During Treatment Monitoring  Duration of HD Treatment -hour(s) 3.5 hour(s)  Cumulative Fluid Removed (mL) per Treatment  1977.59  HD Safety Checks Performed Yes  Intra-Hemodialysis Comments Tx completed  Dialysis Fluid Bolus Normal Saline  Bolus Amount (mL) 300 mL  Post Treatment  Dialyzer Clearance Lightly streaked  Liters Processed 84  Fluid Removed (mL) 2000 mL  Tolerated HD Treatment Yes  Hemodialysis Catheter Right Internal jugular Double lumen Permanent (Tunneled)  Placement Date/Time: 08/09/23 1013   Serial / Lot #: [id number]  Expiration Date: 12/14/27  Time Out: Correct patient;Correct site;Correct procedure  Maximum sterile barrier precautions: Hand hygiene;Cap;Mask;Sterile gown;Sterile gloves;Large sterile s...  Site Condition No complications  Blue Lumen Status Flushed;Heparin locked;Dead end cap in place  Red Lumen Status Flushed;Heparin locked;Dead end cap in place  Purple Lumen Status N/A  Catheter fill solution Heparin 1000 units/ml  Catheter fill volume (Arterial) 1.6 cc  Catheter fill volume (Venous) 1.6  Dressing Type Transparent  Dressing Status Antimicrobial disc in place;Clean, Dry, Intact  Interventions Other (Comment) (deaccessed)  Drainage Description None  Dressing  Change Due 08/16/23  Post treatment catheter status Capped and Clamped     Stacie Glaze LPN [facility]

## 2023-08-11 NOTE — Plan of Care (Signed)
  Problem: Education: Goal: Knowledge of General Education information will improve Description: Including pain rating scale, medication(s)/side effects and non-pharmacologic comfort measures Outcome: Progressing   Problem: Health Behavior/Discharge Planning: Goal: Ability to manage health-related needs will improve Outcome: Progressing   Problem: Clinical Measurements: Goal: Ability to maintain clinical measurements within normal limits will improve Outcome: Progressing Goal: Will remain free from infection Outcome: Progressing Goal: Diagnostic test results will improve Outcome: Progressing Goal: Respiratory complications will improve Outcome: Progressing Goal: Cardiovascular complication will be avoided Outcome: Progressing   Problem: Activity: Goal: Risk for activity intolerance will decrease Outcome: Progressing   Problem: Nutrition: Goal: Adequate nutrition will be maintained Outcome: Progressing   Problem: Coping: Goal: Level of anxiety will decrease Outcome: Progressing   Problem: Elimination: Goal: Will not experience complications related to bowel motility Outcome: Progressing Goal: Will not experience complications related to urinary retention Outcome: Progressing   Problem: Pain Management: Goal: General experience of comfort will improve Outcome: Progressing   Problem: Safety: Goal: Ability to remain free from injury will improve Outcome: Progressing   Problem: Skin Integrity: Goal: Risk for impaired skin integrity will decrease Outcome: Progressing   Problem: Education: Goal: Ability to demonstrate management of disease process will improve Outcome: Progressing Goal: Ability to verbalize understanding of medication therapies will improve Outcome: Progressing Goal: Individualized Educational Video(s) Outcome: Progressing   Problem: Activity: Goal: Capacity to carry out activities will improve Outcome: Progressing   Problem: Cardiac: Goal:  Ability to achieve and maintain adequate cardiopulmonary perfusion will improve Outcome: Progressing   Problem: Education: Goal: Knowledge of disease and its progression will improve Outcome: Progressing   Problem: Health Behavior/Discharge Planning: Goal: Ability to manage health-related needs will improve Outcome: Progressing   Problem: Clinical Measurements: Goal: Complications related to the disease process or treatment will be avoided or minimized Outcome: Progressing Goal: Dialysis access will remain free of complications Outcome: Progressing   Problem: Activity: Goal: Activity intolerance will improve Outcome: Progressing   Problem: Fluid Volume: Goal: Fluid volume balance will be maintained or improved Outcome: Progressing   Problem: Nutritional: Goal: Ability to make appropriate dietary choices will improve Outcome: Progressing   Problem: Respiratory: Goal: Respiratory symptoms related to disease process will be avoided Outcome: Progressing   Problem: Self-Concept: Goal: Body image disturbance will be avoided or minimized Outcome: Progressing   Problem: Urinary Elimination: Goal: Progression of disease will be identified and treated Outcome: Progressing

## 2023-08-11 NOTE — Progress Notes (Signed)
PT Cancellation Note  Patient Details Name: Andrew Schmitt MRN: [id number] DOB: [birth year]   Cancelled Treatment:    Reason Eval/Treat Not Completed: Patient at procedure or test/unavailable (Pt off unit to HD, will follow up at later date/time as pt able and schedule allows.)   Renaldo Fiddler PT, DPT Acute Rehabilitation Services Office [phone]  08/11/23 1:58 PM

## 2023-08-11 NOTE — TOC Progression Note (Signed)
Transition of Care [facility]) - Progression Note    Patient Details  Name: Andrew Schmitt MRN: [id number] Date of Birth: [birth year]  [facility]) CM/SW Contact  Harriet Masson, RN Phone Number: 08/11/2023, 3:11 PM  Clinical Narrative:    Varney Baas with [employer] of plan for discharge tomorrow. Hospital bed and [facility] need to be delivered to the home.  Notified Lynette with [facility] of plan to discharge tomorrow. Address, Phone number and PCP verified.  TOC following.   Expected Discharge Plan: Home/Self Care Barriers to Discharge: Continued Medical Work up  Expected Discharge Plan and Services In-house Referral: NA Discharge Planning Services: CM Consult Post Acute Care Choice: NA Living arrangements for the past 2 months: Single Family Home                 DME Arranged: N/A DME Agency: NA       HH Arranged: NA           Social Determinants of Health (SDOH) Interventions SDOH Screenings   Food Insecurity: No Food Insecurity (07/22/2023)  Housing: Low Risk  (07/22/2023)  Transportation Needs: No Transportation Needs (07/22/2023)  Utilities: Not At Risk (07/22/2023)  Alcohol Screen: Low Risk  (07/07/2023)  Depression (PHQ2-9): Low Risk  (07/07/2023)  Financial Resource Strain: Low Risk  (07/07/2023)  Physical Activity: Insufficiently Active (07/07/2023)  Social Connections: Moderately Integrated (07/07/2023)  Stress: No Stress Concern Present (07/07/2023)  Tobacco Use: Low Risk  (08/04/2023)  Health Literacy: Adequate Health Literacy (07/07/2023)    Readmission Risk Interventions     No data to display

## 2023-08-11 NOTE — Progress Notes (Addendum)
Pt has been accepted at [facility] at [facility] on Mon, Tues, Thurs, Fri with 12:15 pm chair time. Pt can start as soon as tomorrow and would need to arrive at 11:30 for first appt to complete paperwork prior to treatment. Spoke to pt's daughter to confirm that she was made aware of this info on Tuesday by staff covering for renal navigator. Daughter voiced understanding and would like to speak with nephrologist if possible. Contacted attending, nephrologist, pt's RN, and RN CM with above info/update. Also added arrangements to AVS. Nephrologist to f/u with pt's daughter. Contacted renal NP regarding clinic's need for orders at d/c. Will assist as needed.   Olivia Canter Renal Navigator [phone]  Addendum at 4:06 pm: Daughter requested that out-pt HD info sheet be left in pt's room for family to review at a later time. Pt currently in HD. Info sheet left for pt's family on pt's over the bed table which was right at the entrance of pt's room. Daughter advised of location of information.

## 2023-08-11 NOTE — Progress Notes (Signed)
Occupational Therapy Treatment Patient Details Name: Andrew Schmitt MRN: [id number] DOB: 1951-01-02 Today's Date: 08/11/2023   History of present illness Patient is a 72 year old with acute on chronic diastolic CHF, Acute hypoxemic hyercapnic respiratory failure, acute cardiogenic pulmonary edema, left pleural effusion, metabolic encephalopathy. S/p left thoracentesis. CBI initiated on 12/18 for gross hematuria. S/p temp HD Rt IJ catheter placed 12/19.   OT comments  Patient received in supine and required encouragement to participate due to feeling fatigued but agreed to attempt EOB. Once EOB patient asked to stand from EOB and after standing for a few minutes asked to walk in hallway. Patient ambulated with RW and CGA into hallway and back into room to use bathroom. Patient instructed on safety with toilet transfer and CGA and patient was able to manage toilet hygiene seated. Patient stood at sink for hand hygiene before transfer to recliner. Patient pleased with how much he was able to do today. Discharge recommendations continue to be appropriate for home with HHOT to follow. Acute OT to continue to follow.       If plan is discharge home, recommend the following:  A little help with walking and/or transfers;A lot of help with bathing/dressing/bathroom;Assistance with cooking/housework;Assist for transportation;Help with stairs or ramp for entrance   Equipment Recommendations  BSC/3in1;Tub/shower seat    Recommendations for Other Services      Precautions / Restrictions Precautions Precautions: Fall Restrictions Weight Bearing Restrictions Per Provider Order: No       Mobility Bed Mobility Overal bed mobility: Needs Assistance Bed Mobility: Supine to Sit     Supine to sit: Min assist, Used rails     General bed mobility comments: min assist to raise trunk    Transfers Overall transfer level: Needs assistance Equipment used: Rolling walker (2 wheels) Transfers: Sit  to/from Stand, Bed to chair/wheelchair/BSC Sit to Stand: Contact guard assist           General transfer comment: CGA to power up and for mobility     Balance Overall balance assessment: Needs assistance Sitting-balance support: Feet supported Sitting balance-Leahy Scale: Fair Sitting balance - Comments: EOB   Standing balance support: Single extremity supported, Bilateral upper extremity supported, During functional activity Standing balance-Leahy Scale: Poor Standing balance comment: able to stand at sink for hand hygiene with CGA                           ADL either performed or assessed with clinical judgement   ADL Overall ADL's : Needs assistance/impaired     Grooming: Wash/dry hands;Wash/dry face;Contact guard assist;Standing                   Insurance risk surveyor (2 wheels) Toilet Transfer Details (indicate cue type and reason): ambulated to bathroom with CGA Toileting- Clothing Manipulation and Hygiene: Supervision/safety;Sitting/lateral lean              Extremity/Trunk Assessment              Vision       Perception     Praxis      Cognition Arousal: Alert Behavior During Therapy: Flat affect Overall Cognitive Status: Impaired/Different from baseline Area of Impairment: Attention, Following commands, Awareness, Safety/judgement, Problem solving                   Current Attention Level: Selective   Following Commands: Follows one step commands consistently, Follows  one step commands with increased time, Follows multi-step commands with increased time, Follows multi-step commands consistently Safety/Judgement: Decreased awareness of deficits Awareness: Emergent Problem Solving: Decreased initiation, Requires verbal cues, Slow processing General Comments: encouragement to participate initially, pleased with outcome at end of session        Exercises       Shoulder Instructions       General Comments      Pertinent Vitals/ Pain       Pain Assessment Pain Assessment: No/denies pain  Home Living                                          Prior Functioning/Environment              Frequency  Min 1X/week        Progress Toward Goals  OT Goals(current goals can now be found in the care plan section)  Progress towards OT goals: Progressing toward goals  Acute Rehab OT Goals Patient Stated Goal: get better OT Goal Formulation: With patient Time For Goal Achievement: 08/12/23 Potential to Achieve Goals: Good ADL Goals Pt Will Perform Lower Body Bathing: with supervision;sitting/lateral leans;sit to/from stand;with adaptive equipment Pt Will Perform Lower Body Dressing: with supervision;with adaptive equipment;sit to/from stand Pt Will Transfer to Toilet: with supervision;ambulating;bedside commode Pt Will Perform Toileting - Clothing Manipulation and hygiene: with supervision;sitting/lateral leans;sit to/from stand Pt/caregiver will Perform Home Exercise Program: Increased strength;Both right and left upper extremity;With theraband;With written HEP provided;Independently Additional ADL Goal #1: Patient will demonstrate ability to Independently state 3 strategies for improved management of CHF with handout provided.  Plan      Co-evaluation                 AM-PAC OT "6 Clicks" Daily Activity     Outcome Measure   Help from another person eating meals?: None Help from another person taking care of personal grooming?: A Little Help from another person toileting, which includes using toliet, bedpan, or urinal?: A Little Help from another person bathing (including washing, rinsing, drying)?: A Lot Help from another person to put on and taking off regular upper body clothing?: None Help from another person to put on and taking off regular lower body clothing?: A Lot 6 Click Score: 18    End of  Session Equipment Utilized During Treatment: Rolling walker (2 wheels)  OT Visit Diagnosis: Unsteadiness on feet (R26.81);Muscle weakness (generalized) (M62.81);Other (comment)   Activity Tolerance Patient tolerated treatment well   Patient Left in chair;with call bell/phone within reach   Nurse Communication Mobility status        Time: 1610-9604 OT Time Calculation (min): 43 min  Charges: OT General Charges $OT Visit: 1 Visit OT Treatments $Self Care/Home Management : 23-37 mins $Therapeutic Activity: 8-22 mins  Alfonse Flavors, OTA Acute Rehabilitation Services  Office [phone]   Dewain Penning 08/11/2023, 2:12 PM

## 2023-08-12 ENCOUNTER — Other Ambulatory Visit (HOSPITAL_COMMUNITY): Payer: Self-pay

## 2023-08-12 ENCOUNTER — Inpatient Hospital Stay (HOSPITAL_COMMUNITY): Payer: Medicare PPO

## 2023-08-12 DIAGNOSIS — I5033 Acute on chronic diastolic (congestive) heart failure: Secondary | ICD-10-CM | POA: Diagnosis not present

## 2023-08-12 LAB — CBC WITH DIFFERENTIAL/PLATELET
Abs Immature Granulocytes: 0.07 10*3/uL (ref 0.00–0.07)
Basophils Absolute: 0 10*3/uL (ref 0.0–0.1)
Basophils Relative: 1 %
Eosinophils Absolute: 0.2 10*3/uL (ref 0.0–0.5)
Eosinophils Relative: 4 %
HCT: 25.5 % — ABNORMAL LOW (ref 39.0–52.0)
Hemoglobin: 8 g/dL — ABNORMAL LOW (ref 13.0–17.0)
Immature Granulocytes: 1 %
Lymphocytes Relative: 15 %
Lymphs Abs: 0.9 10*3/uL (ref 0.7–4.0)
MCH: 29.9 pg (ref 26.0–34.0)
MCHC: 31.4 g/dL (ref 30.0–36.0)
MCV: 95.1 fL (ref 80.0–100.0)
Monocytes Absolute: 0.8 10*3/uL (ref 0.1–1.0)
Monocytes Relative: 15 %
Neutro Abs: 3.7 10*3/uL (ref 1.7–7.7)
Neutrophils Relative %: 64 %
Platelets: 105 10*3/uL — ABNORMAL LOW (ref 150–400)
RBC: 2.68 MIL/uL — ABNORMAL LOW (ref 4.22–5.81)
RDW: 15.9 % — ABNORMAL HIGH (ref 11.5–15.5)
WBC: 5.7 10*3/uL (ref 4.0–10.5)
nRBC: 0.4 % — ABNORMAL HIGH (ref 0.0–0.2)

## 2023-08-12 LAB — COMPREHENSIVE METABOLIC PANEL
ALT: 16 U/L (ref 0–44)
AST: 26 U/L (ref 15–41)
Albumin: 3 g/dL — ABNORMAL LOW (ref 3.5–5.0)
Alkaline Phosphatase: 53 U/L (ref 38–126)
Anion gap: 12 (ref 5–15)
BUN: 59 mg/dL — ABNORMAL HIGH (ref 8–23)
CO2: 25 mmol/L (ref 22–32)
Calcium: 8.9 mg/dL (ref 8.9–10.3)
Chloride: 94 mmol/L — ABNORMAL LOW (ref 98–111)
Creatinine, Ser: 4.18 mg/dL — ABNORMAL HIGH (ref 0.61–1.24)
GFR, Estimated: 14 mL/min — ABNORMAL LOW (ref >60)
Glucose, Bld: 106 mg/dL — ABNORMAL HIGH (ref 70–99)
Potassium: 4 mmol/L (ref 3.5–5.1)
Sodium: 131 mmol/L — ABNORMAL LOW (ref 135–145)
Total Bilirubin: 0.6 mg/dL (ref <1.2)
Total Protein: 7.8 g/dL (ref 6.5–8.1)

## 2023-08-12 LAB — PHOSPHORUS: Phosphorus: 4.4 mg/dL (ref 2.5–4.6)

## 2023-08-12 LAB — MAGNESIUM: Magnesium: 2.3 mg/dL (ref 1.7–2.4)

## 2023-08-12 MED ORDER — CALCIUM ACETATE (PHOS BINDER) 667 MG PO CAPS
667.0000 mg | ORAL_CAPSULE | Freq: Three times a day (TID) | ORAL | 1 refills | Status: AC
  Filled 2023-08-12: qty 90, 30d supply, fill #0
  Filled 2023-09-27: qty 90, 30d supply, fill #1

## 2023-08-12 MED ORDER — CHLORHEXIDINE GLUCONATE CLOTH 2 % EX PADS
6.0000 | MEDICATED_PAD | Freq: Every day | CUTANEOUS | Status: DC
  Administered 2023-08-14 – 2023-08-17 (×4): 6 via TOPICAL

## 2023-08-12 MED ORDER — ROSUVASTATIN CALCIUM 10 MG PO TABS
10.0000 mg | ORAL_TABLET | Freq: Every day | ORAL | 1 refills | Status: DC
  Filled 2023-08-12: qty 30, 30d supply, fill #0

## 2023-08-12 MED ORDER — ALLOPURINOL 100 MG PO TABS
ORAL_TABLET | ORAL | 1 refills | Status: DC
  Filled 2023-08-12: qty 16, 28d supply, fill #0

## 2023-08-12 NOTE — Progress Notes (Addendum)
Contacted team this morning to inquire about plans for possible d/c this morning in order for pt to get to HD clinic by 11:30 am for first treatment. Attending confirms plan for d/c this morning. Contacted pt's daughter to confirm plans for d/c this morning with plans for pt to proceed to TCU at [facility] for first treatment today. Daughter aware that pt will need to arrive by 11:30 to complete paperwork prior to 12:15 start time. Daughter agreeable to plan. HD arrangements placed on pt's AVS and schedule letter left for pt and family at bedside yesterday. Renal NP has sent orders to clinic. TCU RN aware of plans for pt to d/c this morning and to arrive at 11:30 for paperwork.   Olivia Canter Renal Navigator [phone]  Addendum at 9:33 am: Pt not stable for d/c today per attending. Contacted daughter and TCU RN to be advised that pt will not d/c today and will start on Monday.

## 2023-08-12 NOTE — Discharge Planning (Signed)
[facility]  Initial Hemodialysis Orders  Dialysis center: [facility]  Patient's name: Andrew Schmitt DOB: 1950-12-22 AKI or ESRD: AKI  Discharge diagnosis: AoCKD 3 2.  Acute hypoxemic hyercapnic respiratory failure, due to acute cardiogenic pulmonary edema, left pleural effusion   Allergies:  Allergies  Allergen Reactions   Ace Inhibitors Cough    Date of First Dialysis: 08/05/2023 Cause of renal disease: Cardiorenal  Dialysis Prescription: Dialysis Frequency: M,T, Th, F Tx duration: 3 BFR: 350 DFR: Autoflow 1.5  EDW: 93.7 kg  Dialyzer: 180NRe UF profile/Sodium modeling?: NO Dialysis Bath: 2.0 K 2.5 Ca  Dialysis access: Access type: RIJ  Date placed: 08/09/2023 Surgeon: IR Needle gauge: na  In Center Medications: Heparin Dose: NO  Type: Porcine VDRA: Calcitriol/Hectorol Per protoc9ol Venofer: Per Protocol Mircera: Per Protocol   Discharge labs: Hgb: 8.0 K+: 4.0  Ca: 8.9 Phos: 4.4 Alb: 3.0  Please draw ALL MONTHLY LABS ON ADMISSION. Additional labs needed: NA Additional notes/follow-up: See other faxes  Alonna Buckler [facility] [phone]

## 2023-08-12 NOTE — Progress Notes (Signed)
Physical Therapy Treatment Patient Details Name: Andrew Schmitt MRN: [id number] DOB: 15-Apr-1951 Today's Date: 08/12/2023   History of Present Illness Patient is a 72 year old with acute on chronic diastolic CHF, Acute hypoxemic hyercapnic respiratory failure, acute cardiogenic pulmonary edema, left pleural effusion, metabolic encephalopathy. S/p left thoracentesis. CBI initiated on 12/18 for gross hematuria. S/p temp HD Rt IJ catheter placed 12/19.    PT Comments  Patient resting in recliner and reports feeling well and eager to mobilize with therapy. No cues required for safe technique with sit<>stand from recliner to RW. Pt performed transfers and gait with improved posture today, maintained safe position to RW. VSS (HR in 90-100's) and pt steady, with no LOB noted. EOS pt preferring to remain OOB and repositioned in recliner for comfort. Will continue to progress pt as able.   If plan is discharge home, recommend the following: A little help with walking and/or transfers;A little help with bathing/dressing/bathroom;Assist for transportation;Help with stairs or ramp for entrance;Assistance with cooking/housework   Can travel by private vehicle        Equipment Recommendations  Hospital bed;BSC/3in1;Rolling walker (2 wheels)    Recommendations for Other Services       Precautions / Restrictions Precautions Precautions: Fall Restrictions Weight Bearing Restrictions Per Provider Order: No     Mobility  Bed Mobility               General bed mobility comments: pt OOB in recliner    Transfers Overall transfer level: Needs assistance Equipment used: Rolling walker (2 wheels) Transfers: Sit to/from Stand, Bed to chair/wheelchair/BSC Sit to Stand: Contact guard assist           General transfer comment: CGA to power up and for mobility    Ambulation/Gait Ambulation/Gait assistance: Contact guard assist Gait Distance (Feet): 400 Feet Assistive device: Rolling  walker (2 wheels) Gait Pattern/deviations: Step-through pattern, Decreased stride length Gait velocity: decr     General Gait Details: pt posture improved and upright with gait, maintained safe position to RW. overall steady, VSS with HR In 90's-100's. no buckling or LOB noted throughout, pt denied fatigue.   Stairs             Wheelchair Mobility     Tilt Bed    Modified Rankin (Stroke Patients Only)       Balance Overall balance assessment: Needs assistance Sitting-balance support: Feet supported Sitting balance-Leahy Scale: Fair Sitting balance - Comments: EOB   Standing balance support: Single extremity supported, Bilateral upper extremity supported, During functional activity Standing balance-Leahy Scale: Poor Standing balance comment: reliant on RW                            Cognition Arousal: Alert Behavior During Therapy: WFL for tasks assessed/performed Overall Cognitive Status: Within Functional Limits for tasks assessed                                          Exercises      General Comments        Pertinent Vitals/Pain Pain Assessment Pain Assessment: No/denies pain    Home Living                          Prior Function  PT Goals (current goals can now be found in the care plan section) Acute Rehab PT Goals Patient Stated Goal: family goal to return home with family support and HHPT PT Goal Formulation: With patient Time For Goal Achievement: 08/19/23 (extended 1 week) Potential to Achieve Goals: Good Progress towards PT goals: Progressing toward goals    Frequency    Min 1X/week      PT Plan      Co-evaluation              AM-PAC PT "6 Clicks" Mobility   Outcome Measure  Help needed turning from your back to your side while in a flat bed without using bedrails?: A Little Help needed moving from lying on your back to sitting on the side of a flat bed without using  bedrails?: A Little Help needed moving to and from a bed to a chair (including a wheelchair)?: A Little Help needed standing up from a chair using your arms (e.g., wheelchair or bedside chair)?: A Little Help needed to walk in hospital room?: A Little Help needed climbing 3-5 steps with a railing? : A Lot 6 Click Score: 17    End of Session Equipment Utilized During Treatment: Gait belt Activity Tolerance: Patient tolerated treatment well Patient left: in chair;with call bell/phone within reach Nurse Communication: Mobility status PT Visit Diagnosis: Unsteadiness on feet (R26.81);Muscle weakness (generalized) (M62.81)     Time: 5284-1324 PT Time Calculation (min) (ACUTE ONLY): 20 min  Charges:    $Gait Training: 8-22 mins PT General Charges $$ ACUTE PT VISIT: 1 Visit                     Wynn Maudlin, DPT Acute Rehabilitation Services Office [phone]  08/12/23 1:45 PM

## 2023-08-12 NOTE — Plan of Care (Signed)
  Problem: Education: Goal: Knowledge of General Education information will improve Description: Including pain rating scale, medication(s)/side effects and non-pharmacologic comfort measures Outcome: Progressing   Problem: Health Behavior/Discharge Planning: Goal: Ability to manage health-related needs will improve Outcome: Progressing   Problem: Clinical Measurements: Goal: Ability to maintain clinical measurements within normal limits will improve Outcome: Progressing Goal: Will remain free from infection Outcome: Progressing Goal: Diagnostic test results will improve Outcome: Progressing Goal: Respiratory complications will improve Outcome: Progressing Goal: Cardiovascular complication will be avoided Outcome: Progressing   Problem: Activity: Goal: Risk for activity intolerance will decrease Outcome: Progressing   Problem: Nutrition: Goal: Adequate nutrition will be maintained Outcome: Progressing   Problem: Coping: Goal: Level of anxiety will decrease Outcome: Progressing   Problem: Elimination: Goal: Will not experience complications related to bowel motility Outcome: Progressing Goal: Will not experience complications related to urinary retention Outcome: Progressing   Problem: Pain Management: Goal: General experience of comfort will improve Outcome: Progressing   Problem: Safety: Goal: Ability to remain free from injury will improve Outcome: Progressing   Problem: Skin Integrity: Goal: Risk for impaired skin integrity will decrease Outcome: Progressing   Problem: Education: Goal: Ability to demonstrate management of disease process will improve Outcome: Progressing Goal: Ability to verbalize understanding of medication therapies will improve Outcome: Progressing Goal: Individualized Educational Video(s) Outcome: Progressing   Problem: Activity: Goal: Capacity to carry out activities will improve Outcome: Progressing   Problem: Cardiac: Goal:  Ability to achieve and maintain adequate cardiopulmonary perfusion will improve Outcome: Progressing   Problem: Education: Goal: Knowledge of disease and its progression will improve Outcome: Progressing   Problem: Health Behavior/Discharge Planning: Goal: Ability to manage health-related needs will improve Outcome: Progressing   Problem: Clinical Measurements: Goal: Complications related to the disease process or treatment will be avoided or minimized Outcome: Progressing Goal: Dialysis access will remain free of complications Outcome: Progressing   Problem: Activity: Goal: Activity intolerance will improve Outcome: Progressing   Problem: Fluid Volume: Goal: Fluid volume balance will be maintained or improved Outcome: Progressing   Problem: Nutritional: Goal: Ability to make appropriate dietary choices will improve Outcome: Progressing   Problem: Respiratory: Goal: Respiratory symptoms related to disease process will be avoided Outcome: Progressing   Problem: Self-Concept: Goal: Body image disturbance will be avoided or minimized Outcome: Progressing   Problem: Urinary Elimination: Goal: Progression of disease will be identified and treated Outcome: Progressing

## 2023-08-12 NOTE — Progress Notes (Signed)
Patient ID: Andrew Schmitt, male   DOB: 11/23/1950, 72 y.o.   MRN: [id number] Andrew Schmitt Progress Note   Assessment/ Plan:   1.  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   Continue to hold ARB and SGLT2 inhibitor for now.   With progressive azotemia and uremia (asterixus) even after holding all diuretics therefore started RRT 08/04/23 with RIJ Temp -  now tunneled  Will plan for HD on TTS sched, next HD 12/28-tomorrow but will also check labs in AM to see if he needs or if he is showing signs of recovery CLIP: looking into TCU. They are interested in PD down the road (holding off on [facility] consult for perm access). Renal navigator following-  has been accepted to start at TCU on Monday 12/30  2.  Acute exacerbation of HFpEF Vol status acceptable/stable currently, holding diuretics as above, UF as tolerated 3.  Encephalopathy: stable, trend wit HD 4.  Anemia: Secondary to chronic disease including chronic kidney disease.  IV iron was given; CTM; ESA qThursday--dose inc'd. S/p 1U prbc 12/23 5.  Hypertension: Blood pressure stable.  HTN meds stopped 6. Pleural effusion: s/p thora predominantly lymphocytic. Some malignancy concern. First cytology negative for malignant cells 7. Mild hyponatremia, attempting to manage with HD/UF 8. 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 Hematuria, traumatic foley removal: per urology, CBI-  has been stopped today   Talked to daughter Andrew Schmitt re plan   Subjective:    Seen and examined bedside. Was looking at discharge today but will now be postponed to tomorrow.  HD yest-  removed 2000 but also still making urine-  labs reflectve of HD but may look a little too good-  like also clearing some on own   Objective:   BP 97/64 (BP Location: Left Arm)   Pulse 73   Temp 98.2 F (36.8 C) (Oral)   Resp 17   Ht  5\' 6"  (1.676 m)   Wt 93.7 kg   SpO2 96%   BMI 33.34 kg/m   Intake/Output Summary (Last 24 hours) at 08/12/2023 1034 Last data filed at 08/11/2023 1901 Gross per 24 hour  Intake --  Output 2700 ml  Net -2700 ml   Weight change: 1.8 kg  Physical Exam: Gen: NAD, sitting up at edge of bed CVS: normal rate, no murmurs Resp: cta b/l Abd: Soft, obese, nontender Ext: very trace bilateral lower extremity edema  Dialysis access: Rt TDC  Urine sediment exam on 12/13: 10-20 RBCs per high-powered field none with dysmorphic features, rare renal tubular epithelial cells, rare WBCs, no muddy brown casts, no cellular casts, 1 lipid-laden cast.  Imaging: No results found.     Labs: BMET Recent Labs  Lab 08/06/23 0236 08/07/23 0227 08/08/23 0227 08/09/23 0254 08/10/23 0244 08/11/23 0240 08/12/23 0246  NA 131* 131* 129* 133* 132* 130* 131*  K 4.3 4.7 4.7 4.3 4.2 4.0 4.0  CL 91* 88* 89* 93* 94* 92* 94*  CO2 31 27 28 25 24 22 25   GLUCOSE 105* 103* 113* 113* 107* 103* 106*  BUN 158* 122* 141* 86* 98* 115* 59*  CREATININE 4.01* 4.31* 4.93* 4.65* 5.57* 5.66* 4.18*  CALCIUM 9.0 9.6 9.4 9.3 9.3 8.9 8.9  PHOS  --   --   --   --   --  5.6* 4.4   CBC Recent Labs  Lab 08/09/23 0254  08/10/23 0244 08/11/23 1304 08/12/23 0246  WBC 9.3 6.9 6.2 5.7  NEUTROABS  --   --   --  3.7  HGB 8.2* 8.2* 8.4* 8.0*  HCT 25.9* 26.1* 26.3* 25.5*  MCV 95.2 95.6 94.6 95.1  PLT 127* 114* 125* 105*    Medications:     calcium acetate  667 mg Oral TID WC   Chlorhexidine Gluconate Cloth  6 each Topical Daily   darbepoetin (ARANESP) injection - DIALYSIS  100 mcg Subcutaneous Q Thu-1800   feeding supplement  237 mL Oral BID BM   finasteride  5 mg Oral Daily   rosuvastatin  10 mg Oral Daily   tamsulosin  0.4 mg Oral Daily   Andrew Schmitt  08/12/2023, 10:34 AM

## 2023-08-12 NOTE — TOC Progression Note (Addendum)
Transition of Care [facility]) - Progression Note    Patient Details  Name: Andrew Schmitt MRN: [id number] Date of Birth: 01-27-1951  Transition of [facility]) CM/SW Contact  Kermit Balo, RN Phone Number: 08/12/2023, 9:33 AM  Clinical Narrative:     Plan is for dc home tomorrow. Family will provide transportation.  CM spoke with Jermain from [employer] and DME will be delivered to the home today. Daughter is aware.  CM will updated [facility] on d/c home tomorrow.  1204: [facility] therapy unable to see the pt until 1/6. Daughter prefers [facility] agency that can have therapy in quicker. [facility] able to accept and have RN see pt over weekend and therapy Monday/Tuesday.   Expected Discharge Plan: Home/Self Care Barriers to Discharge: Continued Medical Work up  Expected Discharge Plan and Services In-house Referral: NA Discharge Planning Services: CM Consult Post Acute Care Choice: NA Living arrangements for the past 2 months: Single Family Home Expected Discharge Date: 08/12/23               DME Arranged: N/A DME Agency: NA       HH Arranged: NA           Social Determinants of Health (SDOH) Interventions SDOH Screenings   Food Insecurity: No Food Insecurity (07/22/2023)  Housing: Low Risk  (07/22/2023)  Transportation Needs: No Transportation Needs (07/22/2023)  Utilities: Not At Risk (07/22/2023)  Alcohol Screen: Low Risk  (07/07/2023)  Depression (PHQ2-9): Low Risk  (07/07/2023)  Financial Resource Strain: Low Risk  (07/07/2023)  Physical Activity: Insufficiently Active (07/07/2023)  Social Connections: Moderately Integrated (07/07/2023)  Stress: No Stress Concern Present (07/07/2023)  Tobacco Use: Low Risk  (08/04/2023)  Health Literacy: Adequate Health Literacy (07/07/2023)    Readmission Risk Interventions     No data to display

## 2023-08-12 NOTE — Progress Notes (Signed)
PROGRESS NOTE    Andrew Schmitt  MVH:[id number] DOB: 08-Oct-1950 DOA: 07/21/2023 PCP: Dorothyann Peng, MD  Chief Complaint  Patient presents with   Bradycardia   Shortness of Breath   Fatigue    Brief Narrative:   [name] was admitted to the hospital with the working diagnosis of acute heart failure decompensation.    [name] with chronic diastolic CHF, hypertension, dyslipidemia, ascending aortic aneurysm, BPH, CKD 3b/4, chronic anemia, gout, obesity presented to the ED with progressive weakness and shortness of breath for 7 days prior to admission. Worsening dyspnea on exertion, and lower extremity edema. Severe symptoms to the point where he was dyspneic with minimal efforts. EMS was called, he was found lethargic and bradycardic, he was placed on supplemental 02 per non re-breather mask and was transported to the ED.  On his initial ED physical examination his blood pressure was 118/66, HR 65, RR 22 and 02 saturation 100% on supplemental 02. Lungs with no wheezing or rhonchi, increased work of breathing, heart with S1 and S2 present and regular, with no gallops, or rubs, abdomen with no distention and positive lower extremity edema    Na 135, K 4.5 Cl 101, bicarbonate 25, glucose 128, bun 63, cr 3,18  Mg 2.9  BNP 183  High sensitive troponin 337, 278, 309 Wbc 4.3 hgb 10.4 plt 132  Urine analysis SG 1.009. protein 100, negative leukocytes, moderate hgb, RBC >50, wbc 6-10    Chest radiograph with cardiomegaly, bilateral hilar vascular congestion, bilateral pleural effusions, moderate to large on the left and small on the right.  CT chest, abdomen and pelvis with no aneurysm identified, moderate left pleural effusion and small right pleural effusion with compressive atelectasis on the left lower lobe and portion of the left upper lobe.  Mild bilateral perinephric stranding.  Mild diffuse body wall edema.  Bilateral renal cysts.   EKG 59 bpm, right axis deviation, qtc 510, sinus  rhythm with first degree AV block, one non conducting P wave, poor R R wave progression, with no significant ST segment or T wave changes.      -12/6: started on diuretics, Coude catheter placed for hematuria w/ CLots and increased discomfort before overt retention -12/7 more encephalopathic, placed on BiPAP, very mild respiratory acidosis, pCO2 in the 60-70 range -Complicated by worsening AKI, nephrology consulting, diuretic dose increased   12/13 improved volume status, but renal function not yet stable.  12/14 persistent elevation in BUN.  12/15 resumed diuretic therapy  12/16 improved volume status and transitioned to oral loop diuretic therapy.  12/17 holding diuretic therapy, continue to have severe BUM elevation, may need renal replacement therapy.  12/18: Gross hematuria with clots after tugging at catheter same morning, Urology consulted, started on continuous bladder irrigation, Foley replaced -12/19, BUN 200 with uremia, temp HD catheter placed, -12/21, CBI clamped -12/22, noted to be in Tzvi Economou-fib asymptomatic -12/22 overnight CBI resumed for hematuria -12/23, transfused 1 unit PRBC on dialysis 12/24, CBI clamped, going for tunneled dialysis catheter    Assessment & Plan:   Principal Problem:   Acute on chronic diastolic CHF (congestive heart failure) (HCC) Active Problems:   Acute kidney injury superimposed on chronic kidney disease (HCC)   Second degree AV block, Mobitz type I   BPH (benign prostatic hyperplasia)   Obesity, class 2   Malnutrition of moderate degree  Acute on chronic diastolic CHF (congestive heart failure) (HCC) Echo with EF 65-70%, no RWMA, moderate LVH Volume per renal  Acute hypoxemic hyercapnic respiratory failure, due to acute cardiogenic pulmonary edema 12/10 SP left thoracentesis 1100 ml removed.  Volume per renal  Exudative Pleural Effusion Negative cytology - path with B cells with mild kappa excess of uncertain clinical significance ->  repeat flow cytometry should be performed if effusion persists/returns Needs follow up chest imaging outpatient and repeat thora if persistent effusion Will repeat CXR today    Metabolic encephalopathy has resolved.    Acute kidney injury superimposed on chronic kidney disease IIIb/IV Patient with progressive azotemia and uremia, required renal replacement therapy.  Planning for HD TTS Per renal, appreciate assistance   Patient had traumatic hematuria, required CBI.  Follow up with urology recommendations.  Bleeding around foley, discussed with Dr. Sherron Monday today, concern for urethral bleeding - leave foley in place and they'll follow outpatient Hb has been relatively stable Clamp CBI    Anemia of chronic renal disease, 12/23 PRBC transfusion x1  Appropriate retic response Labs consistent with chronic disease Will monitor   Thrombocytopenia Noted, monitor  Second degree AV block, Mobitz type I Continue to hold on AV blockers. Continue telemetry.   BPH (benign prostatic hyperplasia) Continue with flomax and proscar  Bladder catheter in place.    Obesity, class 2 Body mass index is 33.34 kg/m.   Malnutrition of moderate degree Continue with nutritional supplements.      DVT prophylaxis: SCD Code Status: full Family Communication: none Disposition:   Status is: Inpatient Remains inpatient appropriate because: need for continued inpatient care   Consultants:  Renal   Procedures:  Echo IMPRESSIONS     1. Left ventricular ejection fraction, by estimation, is 65 to 70%. The  left ventricle has normal function. The left ventricle has no regional  wall motion abnormalities. There is moderate left ventricular hypertrophy.  Left ventricular diastolic  parameters are indeterminate.   2. Right ventricular systolic function is normal. The right ventricular  size is normal.   3. Left atrial size was moderately dilated.   4. The mitral valve is abnormal. Trivial  mitral valve regurgitation. No  evidence of mitral stenosis.   5. The aortic valve is tricuspid. There is mild calcification of the  aortic valve. Aortic valve regurgitation is not visualized. Aortic valve  sclerosis is present, with no evidence of aortic valve stenosis.   6. The inferior vena cava is normal in size with greater than 50%  respiratory variability, suggesting right atrial pressure of 3 mmHg.   12/10 thoracentesis  Antimicrobials:  Anti-infectives (From admission, onward)    Start     Dose/Rate Route Frequency Ordered Stop   08/09/23 1030  ceFAZolin (ANCEF) IVPB 2g/100 mL premix        2 g 200 mL/hr over 30 Minutes Intravenous  Once 08/09/23 0943 08/10/23 0949       Subjective: No complaints  Objective: Vitals:   08/11/23 1816 08/11/23 1930 08/12/23 0120 08/12/23 0609  BP:  90/80 (!) 114/97 97/64  Pulse:  83 81 73  Resp:  19 18 17   Temp:  99 F (37.2 C) 98.4 F (36.9 C) 98.2 F (36.8 C)  TempSrc:  Oral Oral Oral  SpO2:  98% 96% 96%  Weight: 98.9 kg   93.7 kg  Height:        Intake/Output Summary (Last 24 hours) at 08/12/2023 0924 Last data filed at 08/11/2023 1901 Gross per 24 hour  Intake --  Output 2700 ml  Net -2700 ml   Filed Weights   08/11/23 1408  08/11/23 1816 08/12/23 0609  Weight: 100.9 kg 98.9 kg 93.7 kg    Examination:  General: No acute distress. Cardiovascular: RRR Lungs: unlabored GU: urine tea colored Neurological: Alert and oriented 3. Moves all extremities 4 with equal strength. Cranial nerves II through XII grossly intact. Extremities: No clubbing or cyanosis. No edema.   Data Reviewed: I have personally reviewed following labs and imaging studies  CBC: Recent Labs  Lab 08/08/23 0227 08/09/23 0254 08/10/23 0244 08/11/23 1304 08/12/23 0246  WBC 9.1 9.3 6.9 6.2 5.7  NEUTROABS  --   --   --   --  3.7  HGB 7.0* 8.2* 8.2* 8.4* 8.0*  HCT 22.9* 25.9* 26.1* 26.3* 25.5*  MCV 96.6 95.2 95.6 94.6 95.1  PLT 142* 127*  114* 125* 105*    Basic Metabolic Panel: Recent Labs  Lab 08/08/23 0227 08/09/23 0254 08/10/23 0244 08/11/23 0240 08/12/23 0246  NA 129* 133* 132* 130* 131*  K 4.7 4.3 4.2 4.0 4.0  CL 89* 93* 94* 92* 94*  CO2 28 25 24 22 25   GLUCOSE 113* 113* 107* 103* 106*  BUN 141* 86* 98* 115* 59*  CREATININE 4.93* 4.65* 5.57* 5.66* 4.18*  CALCIUM 9.4 9.3 9.3 8.9 8.9  MG  --   --   --   --  2.3  PHOS  --   --   --  5.6* 4.4    GFR: Estimated Creatinine Clearance: 16.6 mL/min (Demon Volante) (by C-G formula based on SCr of 4.18 mg/dL (H)).  Liver Function Tests: Recent Labs  Lab 08/09/23 0254 08/10/23 0244 08/11/23 0240 08/12/23 0246  AST 32 29  --  26  ALT 38 26  --  16  ALKPHOS 59 55  --  53  BILITOT 1.2* 0.9  --  0.6  PROT 8.1 7.8  --  7.8  ALBUMIN 3.1* 3.0* 2.9* 3.0*    CBG: No results for input(s): "GLUCAP" in the last 168 hours.   No results found for this or any previous visit (from the past 240 hours).       Radiology Studies: No results found.      Scheduled Meds:  calcium acetate  667 mg Oral TID WC   Chlorhexidine Gluconate Cloth  6 each Topical Daily   darbepoetin (ARANESP) injection - DIALYSIS  100 mcg Subcutaneous Q Thu-1800   feeding supplement  237 mL Oral BID BM   finasteride  5 mg Oral Daily   rosuvastatin  10 mg Oral Daily   tamsulosin  0.4 mg Oral Daily   Continuous Infusions:  heparin sodium (porcine)     iron sucrose 200 mg (08/11/23 1319)   sodium chloride irrigation       LOS: 21 days    Time spent: over 30 min    Lacretia Nicks, MD [facility]   To contact the attending provider between 7A-7P or the covering provider during after hours 7P-7A, please log into the web site [email or web address] and access using universal [place] password for that web site. If you do not have the password, please call the hospital operator.  08/12/2023, 9:24 AM

## 2023-08-13 ENCOUNTER — Other Ambulatory Visit (HOSPITAL_COMMUNITY): Payer: Self-pay

## 2023-08-13 DIAGNOSIS — I5033 Acute on chronic diastolic (congestive) heart failure: Secondary | ICD-10-CM | POA: Diagnosis not present

## 2023-08-13 LAB — CBC
HCT: 24.9 % — ABNORMAL LOW (ref 39.0–52.0)
HCT: 26.2 % — ABNORMAL LOW (ref 39.0–52.0)
Hemoglobin: 7.9 g/dL — ABNORMAL LOW (ref 13.0–17.0)
Hemoglobin: 8.3 g/dL — ABNORMAL LOW (ref 13.0–17.0)
MCH: 30.2 pg (ref 26.0–34.0)
MCH: 30 pg (ref 26.0–34.0)
MCHC: 31.7 g/dL (ref 30.0–36.0)
MCHC: 31.7 g/dL (ref 30.0–36.0)
MCV: 95 fL (ref 80.0–100.0)
MCV: 94.6 fL (ref 80.0–100.0)
Platelets: 107 10*3/uL — ABNORMAL LOW (ref 150–400)
Platelets: 103 10*3/uL — ABNORMAL LOW (ref 150–400)
RBC: 2.62 MIL/uL — ABNORMAL LOW (ref 4.22–5.81)
RBC: 2.77 MIL/uL — ABNORMAL LOW (ref 4.22–5.81)
RDW: 15.6 % — ABNORMAL HIGH (ref 11.5–15.5)
RDW: 15.4 % (ref 11.5–15.5)
WBC: 5.4 10*3/uL (ref 4.0–10.5)
WBC: 5.2 10*3/uL (ref 4.0–10.5)
nRBC: 0.4 % — ABNORMAL HIGH (ref 0.0–0.2)
nRBC: 0.4 % — ABNORMAL HIGH (ref 0.0–0.2)

## 2023-08-13 LAB — RENAL FUNCTION PANEL
Albumin: 3 g/dL — ABNORMAL LOW (ref 3.5–5.0)
Anion gap: 14 (ref 5–15)
BUN: 81 mg/dL — ABNORMAL HIGH (ref 8–23)
CO2: 24 mmol/L (ref 22–32)
Calcium: 9.2 mg/dL (ref 8.9–10.3)
Chloride: 91 mmol/L — ABNORMAL LOW (ref 98–111)
Creatinine, Ser: 6.12 mg/dL — ABNORMAL HIGH (ref 0.61–1.24)
GFR, Estimated: 9 mL/min — ABNORMAL LOW (ref >60)
Glucose, Bld: 108 mg/dL — ABNORMAL HIGH (ref 70–99)
Phosphorus: 5.9 mg/dL — ABNORMAL HIGH (ref 2.5–4.6)
Potassium: 4.1 mmol/L (ref 3.5–5.1)
Sodium: 129 mmol/L — ABNORMAL LOW (ref 135–145)

## 2023-08-13 LAB — HEMOGLOBIN AND HEMATOCRIT, BLOOD
HCT: 25.2 % — ABNORMAL LOW (ref 39.0–52.0)
Hemoglobin: 8.1 g/dL — ABNORMAL LOW (ref 13.0–17.0)

## 2023-08-13 MED ORDER — POLYETHYLENE GLYCOL 3350 17 G PO PACK
17.0000 g | PACK | Freq: Two times a day (BID) | ORAL | Status: DC
  Administered 2023-08-13: 17 g via ORAL
  Filled 2023-08-13 (×2): qty 1

## 2023-08-13 NOTE — Progress Notes (Signed)
Patient had runs of Ventricular Tachycardia 8 beats on CM at 0252H, seen patient asleep, comfortable, easily arousable, denies chest pain, SOB and palpitations. Vitally stable. Informed Dr. Perrin Maltese about the events.Will Continue monitor vital signs and heart rhythm.

## 2023-08-13 NOTE — TOC Transition Note (Signed)
Discharge medications (#3) are filled and stored in the [facility] awaiting patient discharge.

## 2023-08-13 NOTE — Progress Notes (Signed)
Patient ID: KIAH HOMEYER, male   DOB: 1950/10/08, 72 y.o.   MRN: [id number] [facility] Progress Note   Assessment/ Plan:   1.  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   Continue to hold ARB and SGLT2 inhibitor for now.   With progressive azotemia and uremia (asterixus) even after holding all diuretics therefore started RRT 08/04/23 with RIJ Temp -  now tunneled  Will plan for HD on TTS sched, next HD today checked labs to see if he is showing signs of recovery but BUN and crt both went up quite a bit with no HD CLIP: looking into TCU. They are interested in PD down the road (holding off on [facility] consult for perm access). Renal navigator following-  has been accepted to start at [facility] on Monday 12/30-  plan tentatively was to do one more HD here then could be discharged to start TCU on Monday-  orders have been sent   2.  Acute exacerbation of HFpEF Vol status acceptable/stable , UF as tolerated 3.  Encephalopathy: stable, trend wit HD 4.  Anemia: Secondary to chronic disease including chronic kidney disease.  IV iron was given; CTM; ESA qThursday--dose inc'd. S/p 1U prbc 12/23 5.  Hypertension: Blood pressure stable.  HTN meds stopped 6. Pleural effusion: s/p thora predominantly lymphocytic. Some malignancy concern. First cytology negative for malignant cells 7. Mild hyponatremia-  manage with HD/UF 8. 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 Hematuria, traumatic foley removal: per urology, CBI-  had been stopped-  urine very dark again -  likely need to resume   Talked to daughter Felecia Shelling re plan -  she is worried he is dry wants fluid restriction stopped-  is fine -  the things that may postpone discharge are this VT and the gross hematuria -  per primary   Subjective:    Only 250 UOP recorded-  BUN and crt up without HD-  had a run of  VT but no sxms-  he is frustrated about being here-  did not get any sleep last night -  urine also very dark again after CBI stopped    Objective:   BP 109/66 (BP Location: Left Arm)   Pulse 78   Temp 98.6 F (37 C) (Oral)   Resp 17   Ht 5\' 6"  (1.676 m)   Wt 99 kg   SpO2 99%   BMI 35.23 kg/m   Intake/Output Summary (Last 24 hours) at 08/13/2023 0956 Last data filed at 08/13/2023 0457 Gross per 24 hour  Intake 120 ml  Output 250 ml  Net -130 ml   Weight change: -1.9 kg  Physical Exam: Gen: NAD, sitting up at edge of bed CVS: normal rate, no murmurs Resp: cta b/l Abd: Soft, obese, nontender Ext: very trace bilateral lower extremity edema  Dialysis access: Rt TDC  Urine sediment exam on 12/13: 10-20 RBCs per high-powered field none with dysmorphic features, rare renal tubular epithelial cells, rare WBCs, no muddy brown casts, no cellular casts, 1 lipid-laden cast.  Imaging: DG Chest 2 View Result Date: 08/12/2023 CLINICAL DATA:  Pleural effusion. EXAM: CHEST - 2 VIEW COMPARISON:  Chest radiograph dated 08/01/2023. FINDINGS: Right IJ central venous line with tip over central SVC. Similar appearance of small left pleural effusion and left lung base atelectasis or infiltrate. The right lung is clear. No pneumothorax. Stable mild cardiomegaly.  No acute osseous pathology. Degenerative changes of the spine. IMPRESSION: Similar appearance of small left pleural effusion and left lung base atelectasis or infiltrate. Electronically Signed   By: Elgie Collard M.D.   On: 08/12/2023 17:48       Labs: BMET Recent Labs  Lab 08/07/23 0227 08/08/23 0227 08/09/23 0254 08/10/23 0244 08/11/23 0240 08/12/23 0246 08/13/23 0245  NA 131* 129* 133* 132* 130* 131* 129*  K 4.7 4.7 4.3 4.2 4.0 4.0 4.1  CL 88* 89* 93* 94* 92* 94* 91*  CO2 27 28 25 24 22 25 24   GLUCOSE 103* 113* 113* 107* 103* 106* 108*  BUN 122* 141* 86* 98* 115* 59* 81*  CREATININE 4.31* 4.93* 4.65* 5.57* 5.66* 4.18*  6.12*  CALCIUM 9.6 9.4 9.3 9.3 8.9 8.9 9.2  PHOS  --   --   --   --  5.6* 4.4 5.9*   CBC Recent Labs  Lab 08/10/23 0244 08/11/23 1304 08/12/23 0246 08/13/23 0245  WBC 6.9 6.2 5.7 5.4  NEUTROABS  --   --  3.7  --   HGB 8.2* 8.4* 8.0* 7.9*  HCT 26.1* 26.3* 25.5* 24.9*  MCV 95.6 94.6 95.1 95.0  PLT 114* 125* 105* 107*    Medications:     calcium acetate  667 mg Oral TID WC   Chlorhexidine Gluconate Cloth  6 each Topical Daily   Chlorhexidine Gluconate Cloth  6 each Topical Q0600   darbepoetin (ARANESP) injection - DIALYSIS  100 mcg Subcutaneous Q Thu-1800   feeding supplement  237 mL Oral BID BM   finasteride  5 mg Oral Daily   rosuvastatin  10 mg Oral Daily   tamsulosin  0.4 mg Oral Daily   Cecia Egge A Tzirel Leonor  08/13/2023, 9:56 AM

## 2023-08-13 NOTE — Plan of Care (Signed)
  Problem: Education: Goal: Knowledge of General Education information will improve Description: Including pain rating scale, medication(s)/side effects and non-pharmacologic comfort measures Outcome: Progressing   Problem: Health Behavior/Discharge Planning: Goal: Ability to manage health-related needs will improve Outcome: Progressing   Problem: Activity: Goal: Risk for activity intolerance will decrease Outcome: Progressing   Problem: Nutrition: Goal: Adequate nutrition will be maintained Outcome: Progressing   Problem: Coping: Goal: Level of anxiety will decrease Outcome: Progressing   Problem: Safety: Goal: Ability to remain free from injury will improve Outcome: Progressing   Problem: Skin Integrity: Goal: Risk for impaired skin integrity will decrease Outcome: Progressing   Problem: Education: Goal: Ability to demonstrate management of disease process will improve Outcome: Progressing Goal: Ability to verbalize understanding of medication therapies will improve Outcome: Progressing   Problem: Activity: Goal: Capacity to carry out activities will improve Outcome: Progressing   Problem: Education: Goal: Knowledge of disease and its progression will improve Outcome: Progressing   Problem: Health Behavior/Discharge Planning: Goal: Ability to manage health-related needs will improve Outcome: Progressing

## 2023-08-13 NOTE — Progress Notes (Signed)
Urology called regarding GH.  Went to evaluated,urine dark brown, no clots, some bleeding around the meatus. Catheter irrigated urine clear within a few irrigations. Poor urine color likely due to ESRD. No concern for active bleed. Due to bleeding around catheter will plan for outpatient void trial patient will call clinic to set up.

## 2023-08-13 NOTE — Progress Notes (Signed)
PROGRESS NOTE    Andrew Schmitt  QIO:[id number] DOB: 04/23/1951 DOA: 07/21/2023 PCP: Andrew Peng, MD  Chief Complaint  Patient presents with   Bradycardia   Shortness of Breath   Fatigue    Brief Narrative:   Andrew Schmitt was admitted to the hospital with the working diagnosis of acute heart failure decompensation.    [name] with chronic diastolic CHF, hypertension, dyslipidemia, ascending aortic aneurysm, BPH, CKD 3b/4, chronic anemia, gout, obesity presented to the ED with progressive weakness and shortness of breath for 7 days prior to admission. Worsening dyspnea on exertion, and lower extremity edema. Severe symptoms to the point where he was dyspneic with minimal efforts. EMS was called, he was found lethargic and bradycardic, he was placed on supplemental 02 per non re-breather mask and was transported to the ED.  On his initial ED physical examination his blood pressure was 118/66, HR 65, RR 22 and 02 saturation 100% on supplemental 02. Lungs with no wheezing or rhonchi, increased work of breathing, heart with S1 and S2 present and regular, with no gallops, or rubs, abdomen with no distention and positive lower extremity edema    Na 135, K 4.5 Cl 101, bicarbonate 25, glucose 128, bun 63, cr 3,18  Mg 2.9  BNP 183  High sensitive troponin 337, 278, 309 Wbc 4.3 hgb 10.4 plt 132  Urine analysis SG 1.009. protein 100, negative leukocytes, moderate hgb, RBC >50, wbc 6-10    Chest radiograph with cardiomegaly, bilateral hilar vascular congestion, bilateral pleural effusions, moderate to large on the left and small on the right.  CT chest, abdomen and pelvis with no aneurysm identified, moderate left pleural effusion and small right pleural effusion with compressive atelectasis on the left lower lobe and portion of the left upper lobe.  Mild bilateral perinephric stranding.  Mild diffuse body wall edema.  Bilateral renal cysts.   EKG 59 bpm, right axis deviation, qtc 510, sinus  rhythm with first degree AV block, one non conducting P wave, poor R R wave progression, with no significant ST segment or T wave changes.      -12/6: started on diuretics, Coude catheter placed for hematuria w/ CLots and increased discomfort before overt retention -12/7 more encephalopathic, placed on BiPAP, very mild respiratory acidosis, pCO2 in the 60-70 range -Complicated by worsening AKI, nephrology consulting, diuretic dose increased   12/13 improved volume status, but renal function not yet stable.  12/14 persistent elevation in BUN.  12/15 resumed diuretic therapy  12/16 improved volume status and transitioned to oral loop diuretic therapy.  12/17 holding diuretic therapy, continue to have severe BUM elevation, may need renal replacement therapy.  12/18: Gross hematuria with clots after tugging at catheter same morning, Urology consulted, started on continuous bladder irrigation, Foley replaced -12/19, BUN 200 with uremia, temp HD catheter placed, -12/21, CBI clamped -12/22, noted to be in Andrew Schmitt asymptomatic -12/22 overnight CBI resumed for hematuria -12/23, transfused 1 unit PRBC on dialysis 12/24, CBI clamped, going for tunneled dialysis catheter    Assessment & Plan:   Principal Problem:   Acute on chronic diastolic CHF (congestive heart failure) (HCC) Active Problems:   Acute kidney injury superimposed on chronic kidney disease (HCC)   Second degree AV block, Mobitz type I   BPH (benign prostatic hyperplasia)   Obesity, class 2   Malnutrition of moderate degree  Acute on chronic diastolic CHF (congestive heart failure) (HCC) Echo with EF 65-70%, no RWMA, moderate LVH Volume per renal  Acute hypoxemic hyercapnic respiratory failure, due to acute cardiogenic pulmonary edema 12/10 SP left thoracentesis 1100 ml removed.  Volume per renal  Exudative Pleural Effusion Negative cytology - path with B cells with mild kappa excess of uncertain clinical significance ->  repeat flow cytometry should be performed if effusion persists/returns Needs follow up chest imaging outpatient and repeat thora if persistent effusion CXR 12/27 with small L effusion    Metabolic encephalopathy has resolved.    Acute kidney injury superimposed on chronic kidney disease IIIb/IV Patient with progressive azotemia and uremia, required renal replacement therapy.  Planning for HD TTS Per renal, appreciate assistance   Patient had traumatic hematuria, required CBI.  Follow up with urology recommendations.  Appreciate Andrew Schmitt evaluation, plan to leave foley in place, plan for outpatient void trial  Hb has been relatively stable Clamp CBI    Anemia of chronic renal disease, 12/23 PRBC transfusion x1  Appropriate retic response Labs consistent with chronic disease Will monitor   Thrombocytopenia Noted, monitor  Second degree AV block, Mobitz type I Continue to hold on AV blockers. Continue telemetry.   BPH (benign prostatic hyperplasia) Continue with flomax and proscar  Bladder catheter in place.    Obesity, class 2 Body mass index is 35.23 kg/m.   Malnutrition of moderate degree Continue with nutritional supplements.      DVT prophylaxis: SCD Code Status: full Family Communication: none - daughter over phone Disposition:   Status is: Inpatient Remains inpatient appropriate because: need for continued inpatient care   Consultants:  Renal   Procedures:  Echo IMPRESSIONS     1. Left ventricular ejection fraction, by estimation, is 65 to 70%. The  left ventricle has normal function. The left ventricle has no regional  wall motion abnormalities. There is moderate left ventricular hypertrophy.  Left ventricular diastolic  parameters are indeterminate.   2. Right ventricular systolic function is normal. The right ventricular  size is normal.   3. Left atrial size was moderately dilated.   4. The mitral valve is abnormal. Trivial mitral  valve regurgitation. No  evidence of mitral stenosis.   5. The aortic valve is tricuspid. There is mild calcification of the  aortic valve. Aortic valve regurgitation is not visualized. Aortic valve  sclerosis is present, with no evidence of aortic valve stenosis.   6. The inferior vena cava is normal in size with greater than 50%  respiratory variability, suggesting right atrial pressure of 3 mmHg.   12/10 thoracentesis  Antimicrobials:  Anti-infectives (From admission, onward)    Start     Dose/Rate Route Frequency Ordered Stop   08/09/23 1030  ceFAZolin (ANCEF) IVPB 2g/100 mL premix        2 g 200 mL/hr over 30 Minutes Intravenous  Once 08/09/23 0943 08/10/23 0949       Subjective: No complaints  Objective: Vitals:   08/13/23 1600 08/13/23 1630 08/13/23 1700 08/13/23 1730  BP: 97/73 107/65 99/61 106/65  Pulse: 81 80 79 79  Resp: (!) 21 (!) 24 18 (!) 23  Temp:      TempSrc:      SpO2: 99% 97% 99% 100%  Weight:      Height:        Intake/Output Summary (Last 24 hours) at 08/13/2023 1738 Last data filed at 08/13/2023 0819 Gross per 24 hour  Intake 240 ml  Output 250 ml  Net -10 ml   Filed Weights   08/11/23 1816 08/12/23 0609 08/13/23 0453  Weight: 98.9  kg 93.7 kg 99 kg    Examination:  General: No acute distress. Cardiovascular: RRR Lungs: unlabored Neurological: Alert and oriented 3. Moves all extremities 4 with equal strength. Cranial nerves II through XII grossly intact. Extremities: No clubbing or cyanosis. No edema.  Data Reviewed: I have personally reviewed following labs and imaging studies  CBC: Recent Labs  Lab 08/10/23 0244 08/11/23 1304 08/12/23 0246 08/13/23 0245 08/13/23 1004  WBC 6.9 6.2 5.7 5.4 5.2  NEUTROABS  --   --  3.7  --   --   HGB 8.2* 8.4* 8.0* 7.9* 8.3*  HCT 26.1* 26.3* 25.5* 24.9* 26.2*  MCV 95.6 94.6 95.1 95.0 94.6  PLT 114* 125* 105* 107* 103*    Basic Metabolic Panel: Recent Labs  Lab 08/09/23 0254  08/10/23 0244 08/11/23 0240 08/12/23 0246 08/13/23 0245  NA 133* 132* 130* 131* 129*  K 4.3 4.2 4.0 4.0 4.1  CL 93* 94* 92* 94* 91*  CO2 25 24 22 25 24   GLUCOSE 113* 107* 103* 106* 108*  BUN 86* 98* 115* 59* 81*  CREATININE 4.65* 5.57* 5.66* 4.18* 6.12*  CALCIUM 9.3 9.3 8.9 8.9 9.2  MG  --   --   --  2.3  --   PHOS  --   --  5.6* 4.4 5.9*    GFR: Estimated Creatinine Clearance: 11.7 mL/min (Kyreese Chio) (by C-G formula based on SCr of 6.12 mg/dL (H)).  Liver Function Tests: Recent Labs  Lab 08/09/23 0254 08/10/23 0244 08/11/23 0240 08/12/23 0246 08/13/23 0245  AST 32 29  --  26  --   ALT 38 26  --  16  --   ALKPHOS 59 55  --  53  --   BILITOT 1.2* 0.9  --  0.6  --   PROT 8.1 7.8  --  7.8  --   ALBUMIN 3.1* 3.0* 2.9* 3.0* 3.0*    CBG: No results for input(s): "GLUCAP" in the last 168 hours.   No results found for this or any previous visit (from the past 240 hours).       Radiology Studies: DG Chest 2 View Result Date: 08/12/2023 CLINICAL DATA:  Pleural effusion. EXAM: CHEST - 2 VIEW COMPARISON:  Chest radiograph dated 08/01/2023. FINDINGS: Right IJ central venous line with tip over central SVC. Similar appearance of small left pleural effusion and left lung base atelectasis or infiltrate. The right lung is clear. No pneumothorax. Stable mild cardiomegaly. No acute osseous pathology. Degenerative changes of the spine. IMPRESSION: Similar appearance of small left pleural effusion and left lung base atelectasis or infiltrate. Electronically Signed   By: Elgie Collard M.D.   On: 08/12/2023 17:48        Scheduled Meds:  calcium acetate  667 mg Oral TID WC   Chlorhexidine Gluconate Cloth  6 each Topical Daily   Chlorhexidine Gluconate Cloth  6 each Topical Q0600   darbepoetin (ARANESP) injection - DIALYSIS  100 mcg Subcutaneous Q Thu-1800   feeding supplement  237 mL Oral BID BM   finasteride  5 mg Oral Daily   polyethylene glycol  17 g Oral BID   rosuvastatin  10  mg Oral Daily   tamsulosin  0.4 mg Oral Daily   Continuous Infusions:  heparin sodium (porcine)     iron sucrose 200 mg (08/13/23 1227)   sodium chloride irrigation       LOS: 22 days    Time spent: over 30 min    Lacretia Nicks, MD  [facility]   To contact the attending provider between 7A-7P or the covering provider during after hours 7P-7A, please log into the web site [email or web address] and access using universal [place] password for that web site. If you do not have the password, please call the hospital operator.  08/13/2023, 5:38 PM

## 2023-08-13 NOTE — Procedures (Signed)
HD Note:  Some information was entered later than the data was gathered due to patient care needs. The stated time with the data is accurate.  Received patient in bed to unit.   Alert and oriented.   Informed consent signed and in chart.   Access used: Right upper chest HD catheter Access issues: None  Patient tolerated treatment well.   TX duration: 3.5 hours   Alert, without acute distress.  Total UF removed: 1000 ml  Hand-off given to patient's nurse.   Transported back to the room   Maykel Reitter L. Dareen Piano, RN [facility] Unit.

## 2023-08-13 NOTE — Plan of Care (Signed)
  Problem: Education: Goal: Knowledge of General Education information will improve Description: Including pain rating scale, medication(s)/side effects and non-pharmacologic comfort measures Outcome: Progressing   Problem: Health Behavior/Discharge Planning: Goal: Ability to manage health-related needs will improve Outcome: Progressing   Problem: Clinical Measurements: Goal: Ability to maintain clinical measurements within normal limits will improve Outcome: Progressing Goal: Will remain free from infection Outcome: Progressing Goal: Diagnostic test results will improve Outcome: Progressing Goal: Respiratory complications will improve Outcome: Progressing Goal: Cardiovascular complication will be avoided Outcome: Progressing   Problem: Activity: Goal: Risk for activity intolerance will decrease Outcome: Progressing   Problem: Nutrition: Goal: Adequate nutrition will be maintained Outcome: Progressing   Problem: Coping: Goal: Level of anxiety will decrease Outcome: Progressing   Problem: Elimination: Goal: Will not experience complications related to bowel motility Outcome: Progressing Goal: Will not experience complications related to urinary retention Outcome: Progressing   Problem: Pain Management: Goal: General experience of comfort will improve Outcome: Progressing   Problem: Safety: Goal: Ability to remain free from injury will improve Outcome: Progressing   Problem: Skin Integrity: Goal: Risk for impaired skin integrity will decrease Outcome: Progressing   Problem: Education: Goal: Ability to demonstrate management of disease process will improve Outcome: Progressing Goal: Ability to verbalize understanding of medication therapies will improve Outcome: Progressing Goal: Individualized Educational Video(s) Outcome: Progressing   Problem: Activity: Goal: Capacity to carry out activities will improve Outcome: Progressing   Problem: Cardiac: Goal:  Ability to achieve and maintain adequate cardiopulmonary perfusion will improve Outcome: Progressing   Problem: Education: Goal: Knowledge of disease and its progression will improve Outcome: Progressing   Problem: Health Behavior/Discharge Planning: Goal: Ability to manage health-related needs will improve Outcome: Progressing   Problem: Clinical Measurements: Goal: Complications related to the disease process or treatment will be avoided or minimized Outcome: Progressing Goal: Dialysis access will remain free of complications Outcome: Progressing   Problem: Activity: Goal: Activity intolerance will improve Outcome: Progressing   Problem: Fluid Volume: Goal: Fluid volume balance will be maintained or improved Outcome: Progressing   Problem: Nutritional: Goal: Ability to make appropriate dietary choices will improve Outcome: Progressing   Problem: Respiratory: Goal: Respiratory symptoms related to disease process will be avoided Outcome: Progressing   Problem: Self-Concept: Goal: Body image disturbance will be avoided or minimized Outcome: Progressing   Problem: Urinary Elimination: Goal: Progression of disease will be identified and treated Outcome: Progressing

## 2023-08-13 NOTE — Progress Notes (Signed)
Mobility Specialist Progress Note:   08/13/23 1130  Mobility  Activity Ambulated with assistance in hallway  Level of Assistance Contact guard assist, steadying assist  Assistive Device Front wheel walker  Distance Ambulated (ft) 350 ft  Activity Response Tolerated well  Mobility Referral Yes  Mobility visit 1 Mobility  Mobility Specialist Start Time (ACUTE ONLY) 1130  Mobility Specialist Stop Time (ACUTE ONLY) 1145  Mobility Specialist Time Calculation (min) (ACUTE ONLY) 15 min   Pt agreeable to mobility session. Required only minG assistance for safety. No c/o throughout. Pt back in chair with all needs met.   Addison Lank Mobility Specialist Please contact via SecureChat or  Rehab office at [phone]

## 2023-08-14 DIAGNOSIS — I5033 Acute on chronic diastolic (congestive) heart failure: Secondary | ICD-10-CM | POA: Diagnosis not present

## 2023-08-14 DIAGNOSIS — R310 Gross hematuria: Secondary | ICD-10-CM

## 2023-08-14 LAB — CBC WITH DIFFERENTIAL/PLATELET
Abs Immature Granulocytes: 0.03 10*3/uL (ref 0.00–0.07)
Basophils Absolute: 0 10*3/uL (ref 0.0–0.1)
Basophils Relative: 1 %
Eosinophils Absolute: 0.3 10*3/uL (ref 0.0–0.5)
Eosinophils Relative: 5 %
HCT: 22.3 % — ABNORMAL LOW (ref 39.0–52.0)
Hemoglobin: 7.1 g/dL — ABNORMAL LOW (ref 13.0–17.0)
Immature Granulocytes: 1 %
Lymphocytes Relative: 19 %
Lymphs Abs: 1.2 10*3/uL (ref 0.7–4.0)
MCH: 30.5 pg (ref 26.0–34.0)
MCHC: 31.8 g/dL (ref 30.0–36.0)
MCV: 95.7 fL (ref 80.0–100.0)
Monocytes Absolute: 0.9 10*3/uL (ref 0.1–1.0)
Monocytes Relative: 15 %
Neutro Abs: 3.6 10*3/uL (ref 1.7–7.7)
Neutrophils Relative %: 59 %
Platelets: 91 10*3/uL — ABNORMAL LOW (ref 150–400)
RBC: 2.33 MIL/uL — ABNORMAL LOW (ref 4.22–5.81)
RDW: 15.5 % (ref 11.5–15.5)
WBC: 6 10*3/uL (ref 4.0–10.5)
nRBC: 0.5 % — ABNORMAL HIGH (ref 0.0–0.2)

## 2023-08-14 LAB — MAGNESIUM: Magnesium: 2.2 mg/dL (ref 1.7–2.4)

## 2023-08-14 LAB — COMPREHENSIVE METABOLIC PANEL
ALT: 21 U/L (ref 0–44)
AST: 38 U/L (ref 15–41)
Albumin: 2.7 g/dL — ABNORMAL LOW (ref 3.5–5.0)
Alkaline Phosphatase: 45 U/L (ref 38–126)
Anion gap: 9 (ref 5–15)
BUN: 47 mg/dL — ABNORMAL HIGH (ref 8–23)
CO2: 27 mmol/L (ref 22–32)
Calcium: 8.5 mg/dL — ABNORMAL LOW (ref 8.9–10.3)
Chloride: 93 mmol/L — ABNORMAL LOW (ref 98–111)
Creatinine, Ser: 4.35 mg/dL — ABNORMAL HIGH (ref 0.61–1.24)
GFR, Estimated: 14 mL/min — ABNORMAL LOW (ref >60)
Glucose, Bld: 120 mg/dL — ABNORMAL HIGH (ref 70–99)
Potassium: 3.8 mmol/L (ref 3.5–5.1)
Sodium: 129 mmol/L — ABNORMAL LOW (ref 135–145)
Total Bilirubin: 0.2 mg/dL (ref <1.2)
Total Protein: 7.2 g/dL (ref 6.5–8.1)

## 2023-08-14 LAB — PREPARE RBC (CROSSMATCH)

## 2023-08-14 LAB — HEMOGLOBIN AND HEMATOCRIT, BLOOD
HCT: 25.8 % — ABNORMAL LOW (ref 39.0–52.0)
Hemoglobin: 8.1 g/dL — ABNORMAL LOW (ref 13.0–17.0)

## 2023-08-14 LAB — PHOSPHORUS: Phosphorus: 3.8 mg/dL (ref 2.5–4.6)

## 2023-08-14 MED ORDER — SODIUM CHLORIDE 0.9% IV SOLUTION: Freq: Once | INTRAVENOUS | Status: AC

## 2023-08-14 NOTE — Progress Notes (Signed)
PROGRESS NOTE    Andrew Schmitt  YQM:[id number] DOB: 1951-06-07 DOA: 07/21/2023 PCP: Andrew Peng, MD  Chief Complaint  Patient presents with   Bradycardia   Shortness of Breath   Fatigue    Brief Narrative:   Andrew Schmitt was admitted to the hospital with the working diagnosis of acute heart failure decompensation.    [name] with chronic diastolic CHF, hypertension, dyslipidemia, ascending aortic aneurysm, BPH, CKD 3b/4, chronic anemia, gout, obesity presented to the ED with progressive weakness and shortness of breath for 7 days prior to admission. Worsening dyspnea on exertion, and lower extremity edema. Severe symptoms to the point where he was dyspneic with minimal efforts. EMS was called, he was found lethargic and bradycardic, he was placed on supplemental 02 per non re-breather mask and was transported to the ED.  On his initial ED physical examination his blood pressure was 118/66, HR 65, RR 22 and 02 saturation 100% on supplemental 02. Lungs with no wheezing or rhonchi, increased work of breathing, heart with S1 and S2 present and regular, with no gallops, or rubs, abdomen with no distention and positive lower extremity edema    Na 135, K 4.5 Cl 101, bicarbonate 25, glucose 128, bun 63, cr 3,18  Mg 2.9  BNP 183  High sensitive troponin 337, 278, 309 Wbc 4.3 hgb 10.4 plt 132  Urine analysis SG 1.009. protein 100, negative leukocytes, moderate hgb, RBC >50, wbc 6-10    Chest radiograph with cardiomegaly, bilateral hilar vascular congestion, bilateral pleural effusions, moderate to large on the left and small on the right.  CT chest, abdomen and pelvis with no aneurysm identified, moderate left pleural effusion and small right pleural effusion with compressive atelectasis on the left lower lobe and portion of the left upper lobe.  Mild bilateral perinephric stranding.  Mild diffuse body wall edema.  Bilateral renal cysts.   EKG 59 bpm, right axis deviation, qtc 510, sinus  rhythm with first degree AV block, one non conducting P wave, poor R R wave progression, with no significant ST segment or T wave changes.      -12/6: started on diuretics, Coude catheter placed for hematuria w/ CLots and increased discomfort before overt retention -12/7 more encephalopathic, placed on BiPAP, very mild respiratory acidosis, pCO2 in the 60-70 range -Complicated by worsening AKI, nephrology consulting, diuretic dose increased   12/13 improved volume status, but renal function not yet stable.  12/14 persistent elevation in BUN.  12/15 resumed diuretic therapy  12/16 improved volume status and transitioned to oral loop diuretic therapy.  12/17 holding diuretic therapy, continue to have severe BUM elevation, may need renal replacement therapy.  12/18: Gross hematuria with clots after tugging at catheter same morning, Urology consulted, started on continuous bladder irrigation, Foley replaced -12/19, BUN 200 with uremia, temp HD catheter placed, -12/21, CBI clamped -12/22, noted to be in Andrew Schmitt asymptomatic -12/22 overnight CBI resumed for hematuria -12/23, transfused 1 unit PRBC on dialysis 12/24, CBI clamped, going for tunneled dialysis catheter    Assessment & Plan:   Principal Problem:   Acute on chronic diastolic CHF (congestive heart failure) (HCC) Active Problems:   Acute kidney injury superimposed on chronic kidney disease (HCC)   Second degree AV block, Mobitz type I   BPH (benign prostatic hyperplasia)   Obesity, class 2   Malnutrition of moderate degree  Acute Blood Loss Anemia Anemia of chronic renal disease Hematuria  Follow up with urology recommendations.  Discussed with Dr. Pete Schmitt today. S/p  1 unit pRBC 12/23 Had hematuria again overnight, dropped Hb to 7.1.  S/p 1 unit, trend. CBI - management per urology Appropriate retic response Labs consistent with chronic disease   Acute on chronic diastolic CHF (congestive heart failure) (HCC) Echo with  EF 65-70%, no RWMA, moderate LVH Volume per renal    Acute hypoxemic hyercapnic respiratory failure, due to acute cardiogenic pulmonary edema 12/10 SP left thoracentesis 1100 ml removed.  Volume per renal  Exudative Pleural Effusion Negative cytology - path with B cells with mild kappa excess of uncertain clinical significance -> repeat flow cytometry should be performed if effusion persists/returns Needs follow up chest imaging outpatient and repeat thora if persistent effusion CXR 12/27 with small L effusion    Metabolic encephalopathy has resolved.    Acute kidney injury superimposed on chronic kidney disease IIIb/IV Patient with progressive azotemia and uremia, required renal replacement therapy.  Planning for HD TTS Per renal, appreciate assistance   Thrombocytopenia Noted, monitor  Second degree AV block, Mobitz type I Continue to hold on AV blockers. Continue telemetry.   BPH (benign prostatic hyperplasia) Continue with flomax and proscar  Bladder catheter in place.    Obesity, class 2 Body mass index is 36.51 kg/m.   Malnutrition of moderate degree Continue with nutritional supplements.      DVT prophylaxis: SCD Code Status: full Family Communication: none - daughter over phone again today Disposition:   Status is: Inpatient Remains inpatient appropriate because: need for continued inpatient care   Consultants:  Renal   Procedures:  Echo IMPRESSIONS     1. Left ventricular ejection fraction, by estimation, is 65 to 70%. The  left ventricle has normal function. The left ventricle has no regional  wall motion abnormalities. There is moderate left ventricular hypertrophy.  Left ventricular diastolic  parameters are indeterminate.   2. Right ventricular systolic function is normal. The right ventricular  size is normal.   3. Left atrial size was moderately dilated.   4. The mitral valve is abnormal. Trivial mitral valve regurgitation. No  evidence  of mitral stenosis.   5. The aortic valve is tricuspid. There is mild calcification of the  aortic valve. Aortic valve regurgitation is not visualized. Aortic valve  sclerosis is present, with no evidence of aortic valve stenosis.   6. The inferior vena cava is normal in size with greater than 50%  respiratory variability, suggesting right atrial pressure of 3 mmHg.   12/10 thoracentesis  Antimicrobials:  Anti-infectives (From admission, onward)    Start     Dose/Rate Route Frequency Ordered Stop   08/09/23 1030  ceFAZolin (ANCEF) IVPB 2g/100 mL premix        2 g 200 mL/hr over 30 Minutes Intravenous  Once 08/09/23 0943 08/10/23 0949       Subjective: No complaints  Objective: Vitals:   08/14/23 0920 08/14/23 1204 08/14/23 1219 08/14/23 1546  BP: 99/66 114/65 107/63 (!) 91/52  Pulse:  78 75 83  Resp: 14 20 18 20   Temp:  97.6 F (36.4 C) 97.6 F (36.4 C) 98.4 F (36.9 C)  TempSrc:  Oral Oral Oral  SpO2:  98% 98% 100%  Weight:      Height:        Intake/Output Summary (Last 24 hours) at 08/14/2023 1549 Last data filed at 08/14/2023 1501 Gross per 24 hour  Intake 12818.33 ml  Output 16109 ml  Net -2231.67 ml   Filed Weights   08/12/23 0609 08/13/23 0453 08/14/23 0539  Weight: 93.7 kg 99 kg 102.6 kg    Examination:  General: No acute distress. Cardiovascular: RRR Lungs: unlabored Neurological: Alert and oriented 3. Moves all extremities 4 with equal strength. Cranial nerves II through XII grossly intact. Extremities: No clubbing or cyanosis. No edema.  Data Reviewed: I have personally reviewed following labs and imaging studies  CBC: Recent Labs  Lab 08/11/23 1304 08/12/23 0246 08/13/23 0245 08/13/23 1004 08/13/23 2056 08/14/23 0148  WBC 6.2 5.7 5.4 5.2  --  6.0  NEUTROABS  --  3.7  --   --   --  3.6  HGB 8.4* 8.0* 7.9* 8.3* 8.1* 7.1*  HCT 26.3* 25.5* 24.9* 26.2* 25.2* 22.3*  MCV 94.6 95.1 95.0 94.6  --  95.7  PLT 125* 105* 107* 103*  --  91*     Basic Metabolic Panel: Recent Labs  Lab 08/10/23 0244 08/11/23 0240 08/12/23 0246 08/13/23 0245 08/14/23 0148  NA 132* 130* 131* 129* 129*  K 4.2 4.0 4.0 4.1 3.8  CL 94* 92* 94* 91* 93*  CO2 24 22 25 24 27   GLUCOSE 107* 103* 106* 108* 120*  BUN 98* 115* 59* 81* 47*  CREATININE 5.57* 5.66* 4.18* 6.12* 4.35*  CALCIUM 9.3 8.9 8.9 9.2 8.5*  MG  --   --  2.3  --  2.2  PHOS  --  5.6* 4.4 5.9* 3.8    GFR: Estimated Creatinine Clearance: 16.7 mL/min (Abid Bolla) (by C-G formula based on SCr of 4.35 mg/dL (H)).  Liver Function Tests: Recent Labs  Lab 08/09/23 0254 08/10/23 0244 08/11/23 0240 08/12/23 0246 08/13/23 0245 08/14/23 0148  AST 32 29  --  26  --  38  ALT 38 26  --  16  --  21  ALKPHOS 59 55  --  53  --  45  BILITOT 1.2* 0.9  --  0.6  --  0.2  PROT 8.1 7.8  --  7.8  --  7.2  ALBUMIN 3.1* 3.0* 2.9* 3.0* 3.0* 2.7*    CBG: No results for input(s): "GLUCAP" in the last 168 hours.   No results found for this or any previous visit (from the past 240 hours).       Radiology Studies: No results found.       Scheduled Meds:  calcium acetate  667 mg Oral TID WC   Chlorhexidine Gluconate Cloth  6 each Topical Daily   Chlorhexidine Gluconate Cloth  6 each Topical Q0600   darbepoetin (ARANESP) injection - DIALYSIS  100 mcg Subcutaneous Q Thu-1800   feeding supplement  237 mL Oral BID BM   finasteride  5 mg Oral Daily   polyethylene glycol  17 g Oral BID   rosuvastatin  10 mg Oral Daily   tamsulosin  0.4 mg Oral Daily   Continuous Infusions:  heparin sodium (porcine)     iron sucrose 200 mg (08/13/23 1227)   sodium chloride irrigation       LOS: 23 days    Time spent: over 30 min    Lacretia Nicks, MD [facility]   To contact the attending provider between 7A-7P or the covering provider during after hours 7P-7A, please log into the web site [email or web address] and access using universal [place] password for that web site. If you do not have  the password, please call the hospital operator.  08/14/2023, 3:49 PM

## 2023-08-14 NOTE — Progress Notes (Addendum)
1945: pt arrived to the room from dialysis. Pt asked to use the bathroom because he thinks he is about to have a bowel movement. When the CNA uncovered him, she found a big blood clot and the pad underneath of him is soaked wet with blood. This RN assessed the foley cath and there is slow flow of blood around urethral meatus. This RN unclamped the CBI to irrigate and notified on call provider Dr Delton Coombes and charge nurse Tessa Lerner.   2000: CBI clamped and foley is emptied and the fluid color is dark red and no clots noticed from the foley bag.  2015: new order placed for hemoglobin to be checked.  2100: Hemoglobin is 8.1, no new orders. Will continue to monitor.  0010: This RN went to the room to get the patient to the bathroom. This RN noticed a moderate blood clot underneath of pt. This RN notified the Charge nurse and Dr Delton Coombes that this amount is only within almost 4 hours. A picture was sent to Dr Joneen Roach, and she is on her way to the room.  0015: Dr Joneen Roach is in the room. Per Dr Delton Coombes, resume CBI again on slow rate and keep monitoring the bleeding.  0600: blood is still flowing slowly from around urethral meatus. Foley bag is emptied (red color and small pieces of clots found when emptied). Dr Delton Coombes is notified.   0010 photo:    0600 photo:

## 2023-08-14 NOTE — Plan of Care (Signed)
  Problem: Education: Goal: Knowledge of General Education information will improve Description: Including pain rating scale, medication(s)/side effects and non-pharmacologic comfort measures Outcome: Progressing   Problem: Health Behavior/Discharge Planning: Goal: Ability to manage health-related needs will improve Outcome: Progressing   Problem: Clinical Measurements: Goal: Ability to maintain clinical measurements within normal limits will improve Outcome: Progressing Goal: Will remain free from infection Outcome: Progressing Goal: Diagnostic test results will improve Outcome: Progressing Goal: Respiratory complications will improve Outcome: Progressing Goal: Cardiovascular complication will be avoided Outcome: Progressing   Problem: Activity: Goal: Risk for activity intolerance will decrease Outcome: Progressing   Problem: Nutrition: Goal: Adequate nutrition will be maintained Outcome: Progressing   Problem: Coping: Goal: Level of anxiety will decrease Outcome: Progressing   Problem: Elimination: Goal: Will not experience complications related to bowel motility Outcome: Progressing Goal: Will not experience complications related to urinary retention Outcome: Progressing   Problem: Pain Management: Goal: General experience of comfort will improve Outcome: Progressing   Problem: Safety: Goal: Ability to remain free from injury will improve Outcome: Progressing   Problem: Skin Integrity: Goal: Risk for impaired skin integrity will decrease Outcome: Progressing   Problem: Education: Goal: Ability to demonstrate management of disease process will improve Outcome: Progressing Goal: Ability to verbalize understanding of medication therapies will improve Outcome: Progressing Goal: Individualized Educational Video(s) Outcome: Progressing   Problem: Activity: Goal: Capacity to carry out activities will improve Outcome: Progressing   Problem: Cardiac: Goal:  Ability to achieve and maintain adequate cardiopulmonary perfusion will improve Outcome: Progressing   Problem: Education: Goal: Knowledge of disease and its progression will improve Outcome: Progressing   Problem: Health Behavior/Discharge Planning: Goal: Ability to manage health-related needs will improve Outcome: Progressing   Problem: Clinical Measurements: Goal: Complications related to the disease process or treatment will be avoided or minimized Outcome: Progressing Goal: Dialysis access will remain free of complications Outcome: Progressing   Problem: Activity: Goal: Activity intolerance will improve Outcome: Progressing   Problem: Fluid Volume: Goal: Fluid volume balance will be maintained or improved Outcome: Progressing   Problem: Nutritional: Goal: Ability to make appropriate dietary choices will improve Outcome: Progressing   Problem: Respiratory: Goal: Respiratory symptoms related to disease process will be avoided Outcome: Progressing   Problem: Self-Concept: Goal: Body image disturbance will be avoided or minimized Outcome: Progressing   Problem: Urinary Elimination: Goal: Progression of disease will be identified and treated Outcome: Progressing

## 2023-08-14 NOTE — Progress Notes (Signed)
Patient ID: Andrew Schmitt, male   DOB: Jun 24, 1951, 72 y.o.   MRN: [id number] [facility] Progress Note   Assessment/ Plan:   1.  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   Continue to hold ARB and SGLT2 inhibitor for now.   With progressive azotemia and uremia (asterixus) even after holding all diuretics therefore started RRT 08/04/23 with RIJ Temp -  now tunneled  Doing HD on TTS sched, last on Saturday -  will keep on TTS while here-  next HD Tuesday if still inpatient  CLIP:   has been accepted to start at TCU on Monday 12/30-  small things are keeping him here-  currently the recurrent gross hematuria-     2.  Acute exacerbation of HFpEF Vol status acceptable/stable , UF as tolerated 3.  Encephalopathy: stable, trend wit HD 4.  Anemia: Secondary to chronic disease including chronic kidney disease.  IV iron was given; CTM; ESA qThursday--dose inc'd. S/p 1U prbc 12/23 and 12/29 5.  Hypertension: Blood pressure stable.  HTN meds stopped 6. Pleural effusion: s/p thora predominantly lymphocytic. Some malignancy concern. First cytology negative for malignant cells 7. Mild hyponatremia-  manage with HD/UF 8. 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 Hematuria, traumatic foley removal: per urology, CBI-  had been stopped-  urine very dark again -  likely need to resume   Talked to daughter Felecia Shelling re plan -     Subjective:     HD yest -  removed 1000-  had gross hematuria so CBI resumed-  UOP not reliable due to CBI -  labs reflective of HD-  he remains  frustrated about being here-     Objective:   BP 99/66   Pulse 74   Temp 97.9 F (36.6 C) (Oral)   Resp 14   Ht 5\' 6"  (1.676 m)   Wt 102.6 kg   SpO2 100%   BMI 36.51 kg/m   Intake/Output Summary (Last 24 hours) at 08/14/2023 4098 Last data filed at  08/14/2023 1191 Gross per 24 hour  Intake 740 ml  Output 9150 ml  Net -8410 ml   Weight change: 3.6 kg  Physical Exam: Gen: NAD, sitting up at edge of bed CVS: normal rate, no murmurs Resp: cta b/l Abd: Soft, obese, nontender Ext: very trace bilateral lower extremity edema  Dialysis access: Rt TDC  Urine sediment exam on 12/13: 10-20 RBCs per high-powered field none with dysmorphic features, rare renal tubular epithelial cells, rare WBCs, no muddy brown casts, no cellular casts, 1 lipid-laden cast.  Imaging: DG Chest 2 View Result Date: 08/12/2023 CLINICAL DATA:  Pleural effusion. EXAM: CHEST - 2 VIEW COMPARISON:  Chest radiograph dated 08/01/2023. FINDINGS: Right IJ central venous line with tip over central SVC. Similar appearance of small left pleural effusion and left lung base atelectasis or infiltrate. The right lung is clear. No pneumothorax. Stable mild cardiomegaly. No acute osseous pathology. Degenerative changes of the spine. IMPRESSION: Similar appearance of small left pleural effusion and left lung base atelectasis or infiltrate. Electronically Signed   By: Elgie Collard M.D.   On: 08/12/2023 17:48       Labs: BMET Recent Labs  Lab 08/08/23 0227 08/09/23 0254 08/10/23 0244 08/11/23 0240 08/12/23 0246 08/13/23 0245 08/14/23 0148  NA 129* 133* 132* 130* 131* 129* 129*  K 4.7  4.3 4.2 4.0 4.0 4.1 3.8  CL 89* 93* 94* 92* 94* 91* 93*  CO2 28 25 24 22 25 24 27   GLUCOSE 113* 113* 107* 103* 106* 108* 120*  BUN 141* 86* 98* 115* 59* 81* 47*  CREATININE 4.93* 4.65* 5.57* 5.66* 4.18* 6.12* 4.35*  CALCIUM 9.4 9.3 9.3 8.9 8.9 9.2 8.5*  PHOS  --   --   --  5.6* 4.4 5.9* 3.8   CBC Recent Labs  Lab 08/12/23 0246 08/13/23 0245 08/13/23 1004 08/13/23 2056 08/14/23 0148  WBC 5.7 5.4 5.2  --  6.0  NEUTROABS 3.7  --   --   --  3.6  HGB 8.0* 7.9* 8.3* 8.1* 7.1*  HCT 25.5* 24.9* 26.2* 25.2* 22.3*  MCV 95.1 95.0 94.6  --  95.7  PLT 105* 107* 103*  --  91*     Medications:     calcium acetate  667 mg Oral TID WC   Chlorhexidine Gluconate Cloth  6 each Topical Daily   Chlorhexidine Gluconate Cloth  6 each Topical Q0600   darbepoetin (ARANESP) injection - DIALYSIS  100 mcg Subcutaneous Q Thu-1800   feeding supplement  237 mL Oral BID BM   finasteride  5 mg Oral Daily   polyethylene glycol  17 g Oral BID   rosuvastatin  10 mg Oral Daily   tamsulosin  0.4 mg Oral Daily   Kellen Hover A [employer]  08/14/2023, 9:21 AM

## 2023-08-14 NOTE — Progress Notes (Addendum)
Gross hematuria overnight, CBI resumed

## 2023-08-14 NOTE — Progress Notes (Signed)
Mobility Specialist Progress Note:   08/14/23 1330  Mobility  Activity Ambulated with assistance in hallway  Level of Assistance Contact guard assist, steadying assist  Assistive Device Front wheel walker  Distance Ambulated (ft) 350 ft  Activity Response Tolerated well  Mobility Referral Yes  Mobility visit 1 Mobility  Mobility Specialist Start Time (ACUTE ONLY) 1330  Mobility Specialist Stop Time (ACUTE ONLY) 1350  Mobility Specialist Time Calculation (min) (ACUTE ONLY) 20 min   Pt eager for mobility session. Required no physical assistance, only minG for safety. Pt denies any weakness/dizziness/lightheadedness. Had BM once back in room, no blood present. Pt back in chair with all needs met.  Addison Lank Mobility Specialist Please contact via SecureChat or  Rehab office at [phone]

## 2023-08-14 NOTE — Progress Notes (Signed)
19 Days Post-Op Subjective: He had increased bleeding with clots around foley this AM.  CBI was resumed for hematuria last night as well. Urine clear with slow CBI at this time.  No blood per urethra. He is receiving 1 U PRBC today for drop in Hgb to 7.1.  Objective: Vital signs in last 24 hours: Temp:  [97.6 F (36.4 C)-98.8 F (37.1 C)] 98.4 F (36.9 C) (12/29 1546) Pulse Rate:  [73-83] 83 (12/29 1546) Resp:  [14-25] 20 (12/29 1546) BP: (89-114)/(52-66) 91/52 (12/29 1546) SpO2:  [97 %-100 %] 100 % (12/29 1546) Weight:  [102.6 kg] 102.6 kg (12/29 0539)  Intake/Output from previous day: 12/28 0701 - 12/29 0700 In: 740 [P.O.:240] Out: 5950 [Urine:4950] Intake/Output this shift: Total I/O In: 12318.3 [P.O.:717; I.V.:29.3; Blood:362; Other:11100; IV Piggyback:110] Out: 9100 [Urine:9100]  Physical Exam:  General: Alert and oriented Abdomen: Soft, ND Ext: NT, No erythema GU:  foley draining clear urine without clots with CBI, no blood per urethra  Lab Results: Recent Labs    08/13/23 1004 08/13/23 2056 08/14/23 0148  HGB 8.3* 8.1* 7.1*  HCT 26.2* 25.2* 22.3*   BMET Recent Labs    08/13/23 0245 08/14/23 0148  NA 129* 129*  K 4.1 3.8  CL 91* 93*  CO2 24 27  GLUCOSE 108* 120*  BUN 81* 47*  CREATININE 6.12* 4.35*  CALCIUM 9.2 8.5*     Studies/Results: No results found.  Assessment/Plan: Gross hematuria - possibly due to traumatic foley removal, uremia Anemia - receiving 1 PRBC today  Continue CBI and foley for now. Given ongoing intermittent episodes of gross hematuria and anemia, further evaluation with cystoscopy should be considered.  I spoke with the patient's wife and daughter (ER physician in [place]) by phone today.  They are concerned about the ongoing hematuria as well. I will discuss with Dr. Jennette Bill and Dr. Alvester Morin.    LOS: 23 days   Di Kindle 08/14/2023, 4:05 PM

## 2023-08-15 ENCOUNTER — Inpatient Hospital Stay (HOSPITAL_COMMUNITY): Payer: Medicare PPO

## 2023-08-15 DIAGNOSIS — I5033 Acute on chronic diastolic (congestive) heart failure: Secondary | ICD-10-CM | POA: Diagnosis not present

## 2023-08-15 LAB — CBC WITH DIFFERENTIAL/PLATELET
Abs Immature Granulocytes: 0.05 10*3/uL (ref 0.00–0.07)
Basophils Absolute: 0 10*3/uL (ref 0.0–0.1)
Basophils Relative: 0 %
Eosinophils Absolute: 0.3 10*3/uL (ref 0.0–0.5)
Eosinophils Relative: 4 %
HCT: 21.7 % — ABNORMAL LOW (ref 39.0–52.0)
Hemoglobin: 6.9 g/dL — CL (ref 13.0–17.0)
Immature Granulocytes: 1 %
Lymphocytes Relative: 20 %
Lymphs Abs: 1.6 10*3/uL (ref 0.7–4.0)
MCH: 30.7 pg (ref 26.0–34.0)
MCHC: 31.8 g/dL (ref 30.0–36.0)
MCV: 96.4 fL (ref 80.0–100.0)
Monocytes Absolute: 1.2 10*3/uL — ABNORMAL HIGH (ref 0.1–1.0)
Monocytes Relative: 15 %
Neutro Abs: 4.8 10*3/uL (ref 1.7–7.7)
Neutrophils Relative %: 60 %
Platelets: 82 10*3/uL — ABNORMAL LOW (ref 150–400)
RBC: 2.25 MIL/uL — ABNORMAL LOW (ref 4.22–5.81)
RDW: 15.6 % — ABNORMAL HIGH (ref 11.5–15.5)
WBC: 8 10*3/uL (ref 4.0–10.5)
nRBC: 0.5 % — ABNORMAL HIGH (ref 0.0–0.2)

## 2023-08-15 LAB — COMPREHENSIVE METABOLIC PANEL
ALT: 19 U/L (ref 0–44)
AST: 31 U/L (ref 15–41)
Albumin: 2.8 g/dL — ABNORMAL LOW (ref 3.5–5.0)
Alkaline Phosphatase: 49 U/L (ref 38–126)
Anion gap: 12 (ref 5–15)
BUN: 79 mg/dL — ABNORMAL HIGH (ref 8–23)
CO2: 24 mmol/L (ref 22–32)
Calcium: 8.7 mg/dL — ABNORMAL LOW (ref 8.9–10.3)
Chloride: 94 mmol/L — ABNORMAL LOW (ref 98–111)
Creatinine, Ser: 6.43 mg/dL — ABNORMAL HIGH (ref 0.61–1.24)
GFR, Estimated: 8 mL/min — ABNORMAL LOW (ref >60)
Glucose, Bld: 108 mg/dL — ABNORMAL HIGH (ref 70–99)
Potassium: 4.1 mmol/L (ref 3.5–5.1)
Sodium: 130 mmol/L — ABNORMAL LOW (ref 135–145)
Total Bilirubin: 2 mg/dL — ABNORMAL HIGH (ref <1.2)
Total Protein: 6.8 g/dL (ref 6.5–8.1)

## 2023-08-15 LAB — PREPARE RBC (CROSSMATCH)

## 2023-08-15 LAB — PHOSPHORUS: Phosphorus: 6.1 mg/dL — ABNORMAL HIGH (ref 2.5–4.6)

## 2023-08-15 LAB — MAGNESIUM: Magnesium: 2.5 mg/dL — ABNORMAL HIGH (ref 1.7–2.4)

## 2023-08-15 MED ORDER — SODIUM CHLORIDE 0.9% IV SOLUTION: Freq: Once | INTRAVENOUS | Status: AC

## 2023-08-15 MED ORDER — IOHEXOL 350 MG/ML SOLN
50.0000 mL | Freq: Once | INTRAVENOUS | Status: AC | PRN
  Administered 2023-08-15: 50 mL

## 2023-08-15 MED ORDER — SODIUM CHLORIDE 0.9% IV SOLUTION
Freq: Once | INTRAVENOUS | Status: AC
Start: 2023-08-15 — End: 2023-08-15

## 2023-08-15 NOTE — Progress Notes (Signed)
Physical Therapy Treatment Patient Details Name: Andrew Schmitt MRN: [id number] DOB: 21-Apr-1951 Today's Date: 08/15/2023   History of Present Illness Patient is a 72 year old with acute on chronic diastolic CHF, Acute hypoxemic hyercapnic respiratory failure, acute cardiogenic pulmonary edema, left pleural effusion, metabolic encephalopathy. S/p left thoracentesis. CBI initiated on 12/18 for gross hematuria. S/p temp HD Rt IJ catheter placed 12/19.    PT Comments  Patient resting in recliner and on arrival requesting assistance to bathroom for BM. Pt completed sit<>stand with supervision for safety. Assist needed for IV/line management. Pt steady with RW for gait to bathroom and steady with back steps to reverse into bathroom to manage lines safely. Pt able to complete pericare without assist, stand from toilet with supervision using grab bar. Gait training completed after seated rest break and pt amb ~400' with RW, steady pace, upright posture, and no LOB noted. EOS pt preferring to remain OOB in recliner and OT arrived. Will continue to progress pt as able during acute stay.     If plan is discharge home, recommend the following: A little help with walking and/or transfers;A little help with bathing/dressing/bathroom;Assist for transportation;Help with stairs or ramp for entrance;Assistance with cooking/housework   Can travel by private vehicle        Equipment Recommendations  Hospital bed;BSC/3in1;Rolling walker (2 wheels)    Recommendations for Other Services       Precautions / Restrictions Precautions Precautions: Fall Restrictions Weight Bearing Restrictions Per Provider Order: No     Mobility  Bed Mobility               General bed mobility comments: pt OOB in recliner    Transfers Overall transfer level: Needs assistance Equipment used: Rolling walker (2 wheels) Transfers: Sit to/from Stand Sit to Stand: Contact guard assist, Supervision            General transfer comment: supervision for safety with rise from chair and toilet    Ambulation/Gait Ambulation/Gait assistance: Contact guard assist Gait Distance (Feet): 400 Feet Assistive device: Rolling walker (2 wheels) Gait Pattern/deviations: Step-through pattern, Decreased stride length Gait velocity: decr     General Gait Details: gait seady with cautious but fair pace, posture upright and no LOB or way/drift noted throughout. VSS and pt denied fatigue with gait.   Stairs             Wheelchair Mobility     Tilt Bed    Modified Rankin (Stroke Patients Only)       Balance Overall balance assessment: Needs assistance Sitting-balance support: Feet supported Sitting balance-Leahy Scale: Fair Sitting balance - Comments: EOB   Standing balance support: Single extremity supported, Bilateral upper extremity supported, During functional activity Standing balance-Leahy Scale: Poor Standing balance comment: reliant on RW                            Cognition Arousal: Alert Behavior During Therapy: WFL for tasks assessed/performed Overall Cognitive Status: Within Functional Limits for tasks assessed                                          Exercises      General Comments        Pertinent Vitals/Pain Pain Assessment Pain Assessment: No/denies pain    Home Living  Prior Function            PT Goals (current goals can now be found in the care plan section) Acute Rehab PT Goals Patient Stated Goal: family goal to return home with family support and HHPT PT Goal Formulation: With patient Time For Goal Achievement: 08/19/23 (extended 1 week) Potential to Achieve Goals: Good Progress towards PT goals: Progressing toward goals    Frequency    Min 1X/week      PT Plan      Co-evaluation              AM-PAC PT "6 Clicks" Mobility   Outcome Measure  Help needed turning from  your back to your side while in a flat bed without using bedrails?: A Little Help needed moving from lying on your back to sitting on the side of a flat bed without using bedrails?: A Little Help needed moving to and from a bed to a chair (including a wheelchair)?: A Little Help needed standing up from a chair using your arms (e.g., wheelchair or bedside chair)?: A Little Help needed to walk in hospital room?: A Little Help needed climbing 3-5 steps with a railing? : A Lot 6 Click Score: 17    End of Session Equipment Utilized During Treatment: Gait belt Activity Tolerance: Patient tolerated treatment well Patient left: in chair;with call bell/phone within reach Nurse Communication: Mobility status PT Visit Diagnosis: Unsteadiness on feet (R26.81);Muscle weakness (generalized) (M62.81)     Time: 1610-9604 PT Time Calculation (min) (ACUTE ONLY): 34 min  Charges:    $Gait Training: 8-22 mins $Therapeutic Activity: 8-22 mins PT General Charges $$ ACUTE PT VISIT: 1 Visit                     Wynn Maudlin, DPT Acute Rehabilitation Services Office [phone]  08/15/23 3:06 PM

## 2023-08-15 NOTE — Progress Notes (Signed)
Contacted TCU RN this morning to be advised pt is still hospitalized and will not start today. Will assist as needed.   Olivia Canter Renal Navigator [phone]

## 2023-08-15 NOTE — Progress Notes (Signed)
PROGRESS NOTE    Andrew Schmitt  ZOX:[id number] DOB: September 16, 1950 DOA: 07/21/2023 PCP: Dorothyann Peng, MD  Chief Complaint  Patient presents with   Bradycardia   Shortness of Breath   Fatigue    Brief Narrative:   [name] was admitted to the hospital with the working diagnosis of acute heart failure decompensation.    [name] with chronic diastolic CHF, hypertension, dyslipidemia, ascending aortic aneurysm, BPH, CKD 3b/4, chronic anemia, gout, obesity presented to the ED with progressive weakness and shortness of breath for 7 days prior to admission. Worsening dyspnea on exertion, and lower extremity edema. Severe symptoms to the point where he was dyspneic with minimal efforts. EMS was called, he was found lethargic and bradycardic, he was placed on supplemental 02 per non re-breather mask and was transported to the ED.  On his initial ED physical examination his blood pressure was 118/66, HR 65, RR 22 and 02 saturation 100% on supplemental 02. Lungs with no wheezing or rhonchi, increased work of breathing, heart with S1 and S2 present and regular, with no gallops, or rubs, abdomen with no distention and positive lower extremity edema    Na 135, K 4.5 Cl 101, bicarbonate 25, glucose 128, bun 63, cr 3,18  Mg 2.9  BNP 183  High sensitive troponin 337, 278, 309 Wbc 4.3 hgb 10.4 plt 132  Urine analysis SG 1.009. protein 100, negative leukocytes, moderate hgb, RBC >50, wbc 6-10    Chest radiograph with cardiomegaly, bilateral hilar vascular congestion, bilateral pleural effusions, moderate to large on the left and small on the right.  CT chest, abdomen and pelvis with no aneurysm identified, moderate left pleural effusion and small right pleural effusion with compressive atelectasis on the left lower lobe and portion of the left upper lobe.  Mild bilateral perinephric stranding.  Mild diffuse body wall edema.  Bilateral renal cysts.   EKG 59 bpm, right axis deviation, qtc 510, sinus  rhythm with first degree AV block, one non conducting P wave, poor R R wave progression, with no significant ST segment or T wave changes.      -12/6: started on diuretics, Coude catheter placed for hematuria w/ CLots and increased discomfort before overt retention -12/7 more encephalopathic, placed on BiPAP, very mild respiratory acidosis, pCO2 in the 60-70 range -Complicated by worsening AKI, nephrology consulting, diuretic dose increased   12/13 improved volume status, but renal function not yet stable.  12/14 persistent elevation in BUN.  12/15 resumed diuretic therapy  12/16 improved volume status and transitioned to oral loop diuretic therapy.  12/17 holding diuretic therapy, continue to have severe BUM elevation, may need renal replacement therapy.  12/18: Gross hematuria with clots after tugging at catheter same morning, Urology consulted, started on continuous bladder irrigation, Foley replaced -12/19, BUN 200 with uremia, temp HD catheter placed, -12/21, CBI clamped -12/22, noted to be in Andrew Schmitt-fib asymptomatic -12/22 overnight CBI resumed for hematuria -12/23, transfused 1 unit PRBC on dialysis 12/24, CBI clamped, going for tunneled dialysis catheter 12/29 worsening hematuria, transfused 12/30 transfused, plan for hematuria pending urology    Assessment & Plan:   Principal Problem:   Acute on chronic diastolic CHF (congestive heart failure) (HCC) Active Problems:   Acute kidney injury superimposed on chronic kidney disease (HCC)   Second degree AV block, Mobitz type I   BPH (benign prostatic hyperplasia)   Obesity, class 2   Malnutrition of moderate degree  Acute Blood Loss Anemia Anemia of chronic renal disease Hematuria  Follow  up with urology recommendations.  Discussed with Dr. Pete Glatter today. S/p 1 unit pRBC 12/23, 1 unit 12/29.   Had hematuria again overnight, dropped Hb to 6.9.  Additional unit ordered 12/30.  CBI - management per urology Appropriate retic  response Labs consistent with chronic disease   Acute on chronic diastolic CHF (congestive heart failure) (HCC) Echo with EF 65-70%, no RWMA, moderate LVH Volume per renal    Acute hypoxemic hyercapnic respiratory failure, due to acute cardiogenic pulmonary edema 12/10 SP left thoracentesis 1100 ml removed.  Volume per renal  Exudative Pleural Effusion Negative cytology - path with B cells with mild kappa excess of uncertain clinical significance -> repeat flow cytometry should be performed if effusion persists/returns Needs follow up chest imaging outpatient and repeat thora if persistent effusion CXR 12/27 with small L effusion    Metabolic encephalopathy has resolved.    Acute kidney injury superimposed on chronic kidney disease IIIb/IV Patient with progressive azotemia and uremia, required renal replacement therapy.  Planning for HD TTS Per renal, appreciate assistance   Thrombocytopenia Noted, monitor - some chronicity to this, though has downtrended some here  Second degree AV block, Mobitz type I Continue to hold on AV blockers. Continue telemetry.   BPH (benign prostatic hyperplasia) Continue with flomax and proscar  Bladder catheter in place.    Obesity, class 2 Body mass index is 36.51 kg/m.   Malnutrition of moderate degree Continue with nutritional supplements.      DVT prophylaxis: SCD Code Status: full Family Communication: none - daughter over phone again today Disposition:   Status is: Inpatient Remains inpatient appropriate because: need for continued inpatient care   Consultants:  Renal   Procedures:  Echo IMPRESSIONS     1. Left ventricular ejection fraction, by estimation, is 65 to 70%. The  left ventricle has normal function. The left ventricle has no regional  wall motion abnormalities. There is moderate left ventricular hypertrophy.  Left ventricular diastolic  parameters are indeterminate.   2. Right ventricular systolic  function is normal. The right ventricular  size is normal.   3. Left atrial size was moderately dilated.   4. The mitral valve is abnormal. Trivial mitral valve regurgitation. No  evidence of mitral stenosis.   5. The aortic valve is tricuspid. There is mild calcification of the  aortic valve. Aortic valve regurgitation is not visualized. Aortic valve  sclerosis is present, with no evidence of aortic valve stenosis.   6. The inferior vena cava is normal in size with greater than 50%  respiratory variability, suggesting right atrial pressure of 3 mmHg.   12/10 thoracentesis  Antimicrobials:  Anti-infectives (From admission, onward)    Start     Dose/Rate Route Frequency Ordered Stop   08/09/23 1030  ceFAZolin (ANCEF) IVPB 2g/100 mL premix        2 g 200 mL/hr over 30 Minutes Intravenous  Once 08/09/23 0943 08/10/23 0949       Subjective: Upset he was unable to sleep  Objective: Vitals:   08/15/23 0441 08/15/23 0715 08/15/23 0723 08/15/23 1009  BP:  98/62 99/60 102/70  Pulse:   76 75  Resp:   18 18  Temp:  98.5 F (36.9 C)  98.4 F (36.9 C)  TempSrc:    Oral  SpO2: 97%  97%   Weight:      Height:        Intake/Output Summary (Last 24 hours) at 08/15/2023 1013 Last data filed at 08/15/2023 0715 Gross  per 24 hour  Intake 11353.33 ml  Output 16109 ml  Net 703.33 ml   Filed Weights   08/12/23 0609 08/13/23 0453 08/14/23 0539  Weight: 93.7 kg 99 kg 102.6 kg    Examination:  General: No acute distress. Cardiovascular: RRR Lungs: unlabored Abdomen: Soft, nontender, nondistended  GU: thin red urine in foley bag Neurological: Alert and oriented 3. Moves all extremities 4 with equal strength. Cranial nerves II through XII grossly intact. Extremities: No clubbing or cyanosis. No edema.  Data Reviewed: I have personally reviewed following labs and imaging studies  CBC: Recent Labs  Lab 08/12/23 0246 08/13/23 0245 08/13/23 1004 08/13/23 2056 08/14/23 0148  08/14/23 1612 08/15/23 0224  WBC 5.7 5.4 5.2  --  6.0  --  8.0  NEUTROABS 3.7  --   --   --  3.6  --  4.8  HGB 8.0* 7.9* 8.3* 8.1* 7.1* 8.1* 6.9*  HCT 25.5* 24.9* 26.2* 25.2* 22.3* 25.8* 21.7*  MCV 95.1 95.0 94.6  --  95.7  --  96.4  PLT 105* 107* 103*  --  91*  --  82*    Basic Metabolic Panel: Recent Labs  Lab 08/11/23 0240 08/12/23 0246 08/13/23 0245 08/14/23 0148 08/15/23 0224  NA 130* 131* 129* 129* 130*  K 4.0 4.0 4.1 3.8 4.1  CL 92* 94* 91* 93* 94*  CO2 22 25 24 27 24   GLUCOSE 103* 106* 108* 120* 108*  BUN 115* 59* 81* 47* 79*  CREATININE 5.66* 4.18* 6.12* 4.35* 6.43*  CALCIUM 8.9 8.9 9.2 8.5* 8.7*  MG  --  2.3  --  2.2 2.5*  PHOS 5.6* 4.4 5.9* 3.8 6.1*    GFR: Estimated Creatinine Clearance: 11.3 mL/min (Edrian Melucci) (by C-G formula based on SCr of 6.43 mg/dL (H)).  Liver Function Tests: Recent Labs  Lab 08/09/23 0254 08/10/23 0244 08/11/23 0240 08/12/23 0246 08/13/23 0245 08/14/23 0148 08/15/23 0224  AST 32 29  --  26  --  38 31  ALT 38 26  --  16  --  21 19  ALKPHOS 59 55  --  53  --  45 49  BILITOT 1.2* 0.9  --  0.6  --  0.2 2.0*  PROT 8.1 7.8  --  7.8  --  7.2 6.8  ALBUMIN 3.1* 3.0* 2.9* 3.0* 3.0* 2.7* 2.8*    CBG: No results for input(s): "GLUCAP" in the last 168 hours.   No results found for this or any previous visit (from the past 240 hours).       Radiology Studies: No results found.       Scheduled Meds:  calcium acetate  667 mg Oral TID WC   Chlorhexidine Gluconate Cloth  6 each Topical Daily   Chlorhexidine Gluconate Cloth  6 each Topical Q0600   darbepoetin (ARANESP) injection - DIALYSIS  100 mcg Subcutaneous Q Thu-1800   feeding supplement  237 mL Oral BID BM   finasteride  5 mg Oral Daily   polyethylene glycol  17 g Oral BID   rosuvastatin  10 mg Oral Daily   tamsulosin  0.4 mg Oral Daily   Continuous Infusions:  heparin sodium (porcine)     iron sucrose 200 mg (08/13/23 1227)   sodium chloride irrigation       LOS:  24 days    Time spent: over 30 min    Lacretia Nicks, MD [facility]   To contact the attending provider between 7A-7P or the covering provider during  after hours 7P-7A, please log into the web site [email or web address] and access using universal [place] password for that web site. If you do not have the password, please call the hospital operator.  08/15/2023, 10:13 AM

## 2023-08-15 NOTE — Progress Notes (Signed)
Patient ID: Andrew Schmitt, male   DOB: 01/19/1951, 72 y.o.   MRN: [id number] [facility] Progress Note   Assessment/ Plan:   1.  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   Continue to hold ARB and SGLT2 inhibitor for now.   With progressive azotemia and uremia (asterixus) even after holding all diuretics therefore started RRT 08/04/23 with RIJ Temp -  now tunneled  Doing HD on TTS sched, last on Saturday -  will keep on TTS while here-  next HD Tuesday if still inpatient  CLIP:   has been accepted to start at TCU on Monday 12/30-  small things are keeping him here-  currently the recurrent gross hematuria-   2.  Acute exacerbation of HFpEF Vol status acceptable/stable , UF as tolerated 3.  Encephalopathy: stable, trend wit HD 4.  Anemia: Secondary to chronic disease including chronic kidney disease.  IV iron was given; CTM; ESA qThursday--dose inc'd. S/p 1U prbc 12/23 and 12/29 5.  Hypertension: Blood pressure stable.  HTN meds stopped 6. Pleural effusion: s/p thora predominantly lymphocytic. Some malignancy concern. First cytology negative for malignant cells 7. Mild hyponatremia-  manage with HD/UF 8. 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 Hematuria, traumatic foley removal: per urology, CBI-  had been stopped-  urine very dark again -  likely need to resume   Subjective:    For HD tomorrow.  CBI resumed.  Hgb down to 6.9, to get 1 u pRBCs   Objective:   BP 120/72 (BP Location: Left Arm)   Pulse 77   Temp 98.2 F (36.8 C) (Oral)   Resp 18   Ht 5\' 6"  (1.676 m)   Wt 102.6 kg   SpO2 100%   BMI 36.51 kg/m   Intake/Output Summary (Last 24 hours) at 08/15/2023 1500 Last data filed at 08/15/2023 1330 Gross per 24 hour  Intake 8588.33 ml  Output 5450 ml  Net 3138.33 ml   Weight change:    Physical Exam: Gen: NAD, lying in bed CVS: normal rate, no murmurs Resp: cta b/l Abd: Soft, obese, nontender Ext: very trace bilateral lower extremity edema  Dialysis access: Rt TDC  Urine sediment exam on 12/13: 10-20 RBCs per high-powered field none with dysmorphic features, rare renal tubular epithelial cells, rare WBCs, no muddy brown casts, no cellular casts, 1 lipid-laden cast.  Imaging: No results found.      Labs: BMET Recent Labs  Lab 08/09/23 0254 08/10/23 0244 08/11/23 0240 08/12/23 0246 08/13/23 0245 08/14/23 0148 08/15/23 0224  NA 133* 132* 130* 131* 129* 129* 130*  K 4.3 4.2 4.0 4.0 4.1 3.8 4.1  CL 93* 94* 92* 94* 91* 93* 94*  CO2 25 24 22 25 24 27 24   GLUCOSE 113* 107* 103* 106* 108* 120* 108*  BUN 86* 98* 115* 59* 81* 47* 79*  CREATININE 4.65* 5.57* 5.66* 4.18* 6.12* 4.35* 6.43*  CALCIUM 9.3 9.3 8.9 8.9 9.2 8.5* 8.7*  PHOS  --   --  5.6* 4.4 5.9* 3.8 6.1*   CBC Recent Labs  Lab 08/12/23 0246 08/13/23 0245 08/13/23 1004 08/13/23 2056 08/14/23 0148 08/14/23 1612 08/15/23 0224  WBC 5.7 5.4 5.2  --  6.0  --  8.0  NEUTROABS 3.7  --   --   --  3.6  --  4.8  HGB 8.0* 7.9*  8.3* 8.1* 7.1* 8.1* 6.9*  HCT 25.5* 24.9* 26.2* 25.2* 22.3* 25.8* 21.7*  MCV 95.1 95.0 94.6  --  95.7  --  96.4  PLT 105* 107* 103*  --  91*  --  82*    Medications:     calcium acetate  667 mg Oral TID WC   Chlorhexidine Gluconate Cloth  6 each Topical Daily   Chlorhexidine Gluconate Cloth  6 each Topical Q0600   darbepoetin (ARANESP) injection - DIALYSIS  100 mcg Subcutaneous Q Thu-1800   feeding supplement  237 mL Oral BID BM   finasteride  5 mg Oral Daily   polyethylene glycol  17 g Oral BID   rosuvastatin  10 mg Oral Daily   tamsulosin  0.4 mg Oral Daily   Nocholas Damaso  08/15/2023, 3:00 PM

## 2023-08-15 NOTE — Progress Notes (Signed)
OT Cancellation Note  Patient Details Name: Andrew Schmitt MRN: [id number] DOB: 1951-08-02   Cancelled Treatment:    Reason Eval/Treat Not Completed: Patient at procedure or test/ unavailable Off unit for CT. Will follow up for OT session as schedule permits.  Lorre Munroe 08/15/2023, 12:24 PM

## 2023-08-15 NOTE — Progress Notes (Signed)
   20 Days Post-Op Subjective: Patient up in chair with CBI on low gtt. with lightly blood-tinged irrigant.  Spoke with his daughter, who is an ER physician, over the phone reviewing the case and plan.  All parties were in agreement.  Objective: Vital signs in last 24 hours: Temp:  [97.6 F (36.4 C)-98.7 F (37.1 C)] 98.2 F (36.8 C) (12/30 1047) Pulse Rate:  [75-83] 75 (12/30 1047) Resp:  [18-20] 20 (12/30 1047) BP: (91-117)/(52-70) 106/68 (12/30 1047) SpO2:  [96 %-100 %] 100 % (12/30 1047)  Assessment/Plan: # Hematuria # Traumatic Foley removal # Uremic bleeding- resolved # Clot obstruction of urine   Over wire placement of council hematuria catheter fashioned from a 38f Rusch.    Only around 50cc of clot material hand irrigated.  Catheter is somewhat positional and nearly hubbed despite a calculated prostate size around 60 to 75g.  CT hematuria collected 12/18 does reveal some residual clot burden which I was unable to evacuate with hand irrigation. Clear urine for several days, then with some clot mobilization by hand irrigation and expulsion around foley. Drop in Hgb x2 days with mild hematuria reported. Irrigant very lightly tinged with old blood. CBI on lowest possible setting. This does not represent frank bleeding in character, not would it's quantity be responsible for his amount of HgB reduction.   Cystogram ordered today with assessment of clot burden.  N.p.o. at midnight  Follow up w/ Dr. Alvester Morin 1-2 weeks after discharge     Intake/Output from previous day: 12/29 0701 - 12/30 0700 In: 19339.3 [P.O.:953; I.V.:29.3; Blood:647; IV Piggyback:110] Out: 78295 [Urine:14550]  Intake/Output this shift: Total I/O In: 354 [Blood:354] Out: -   Physical Exam:  General: Alert and oriented CV: No cyanosis Lungs: equal chest rise Abdomen: Soft, NTND, no rebound or guarding Gu: CBI low.  Clear urine/irrigant tinged with old blood.   Lab Results: Recent Labs     08/14/23 0148 08/14/23 1612 08/15/23 0224  HGB 7.1* 8.1* 6.9*  HCT 22.3* 25.8* 21.7*   BMET Recent Labs    08/14/23 0148 08/15/23 0224  NA 129* 130*  K 3.8 4.1  CL 93* 94*  CO2 27 24  GLUCOSE 120* 108*  BUN 47* 79*  CREATININE 4.35* 6.43*  CALCIUM 8.5* 8.7*     Studies/Results: No results found.     LOS: 24 days   Elmon Kirschner, NP [facility] Pager: [phone]  08/15/2023, 11:06 AM

## 2023-08-15 NOTE — Progress Notes (Signed)
Occupational Therapy Treatment Patient Details Name: Andrew Schmitt MRN: [id number] DOB: 1950-08-22 Today's Date: 08/15/2023   History of present illness Patient is a 72 year old with acute on chronic diastolic CHF, Acute hypoxemic hyercapnic respiratory failure, acute cardiogenic pulmonary edema, left pleural effusion, metabolic encephalopathy. S/p left thoracentesis. CBI initiated on 12/18 for gross hematuria. S/p temp HD Rt IJ catheter placed 12/19.   OT comments  Pt received after hallway mobility with PT. Pt requested UB bathing assist and able to complete with Setup Assist. Initiated UE HEP education using theraband with handout provided. Pt would likely benefit from further reinforcement of UE HEP to maximize UB strength.      If plan is discharge home, recommend the following:  A little help with walking and/or transfers;A lot of help with bathing/dressing/bathroom;Assistance with cooking/housework;Assist for transportation;Help with stairs or ramp for entrance   Equipment Recommendations  BSC/3in1;Tub/shower seat    Recommendations for Other Services      Precautions / Restrictions Precautions Precautions: Fall Restrictions Weight Bearing Restrictions Per Provider Order: No       Mobility Bed Mobility               General bed mobility comments: in chair on entry    Transfers                         Balance Overall balance assessment: Needs assistance Sitting-balance support: Feet supported Sitting balance-Leahy Scale: Fair                                     ADL either performed or assessed with clinical judgement   ADL Overall ADL's : Needs assistance/impaired         Upper Body Bathing: Set up;Sitting Upper Body Bathing Details (indicate cue type and reason): able to direct setup assist needs to bathe underarm and back while in chair     Upper Body Dressing : Set up;Sitting                     General ADL  Comments: UB bathing per pt preference and then UE HEP education w/ handout provided    Extremity/Trunk Assessment Upper Extremity Assessment Upper Extremity Assessment: Right hand dominant;Generalized weakness;LUE deficits/detail LUE Deficits / Details: weaker than RUE with resistive shoulder flexion/abduction   Lower Extremity Assessment Lower Extremity Assessment: Defer to PT evaluation        Vision   Vision Assessment?: No apparent visual deficits   Perception     Praxis Praxis Praxis: WFL    Cognition Arousal: Alert Behavior During Therapy: WFL for tasks assessed/performed Overall Cognitive Status: Within Functional Limits for tasks assessed                                          Exercises Exercises: General Upper Extremity General Exercises - Upper Extremity Shoulder Flexion: Strengthening, Both, 10 reps, Seated, Theraband Theraband Level (Shoulder Flexion): Level 1 (Yellow) Shoulder Horizontal ABduction: Strengthening, Both, 10 reps, Seated, Theraband Theraband Level (Shoulder Horizontal Abduction): Level 1 (Yellow) Elbow Flexion: Strengthening, Both, 10 reps, Seated, Theraband Theraband Level (Elbow Flexion): Level 1 (Yellow) Elbow Extension: Strengthening, Both, 10 reps, Theraband, Seated Theraband Level (Elbow Extension): Level 1 (Yellow)    Shoulder Instructions  General Comments      Pertinent Vitals/ Pain       Pain Assessment Pain Assessment: No/denies pain  Home Living                                          Prior Functioning/Environment              Frequency  Min 1X/week        Progress Toward Goals  OT Goals(current goals can now be found in the care plan section)  Progress towards OT goals: Progressing toward goals  Acute Rehab OT Goals Patient Stated Goal: home soon OT Goal Formulation: With patient Time For Goal Achievement: 08/29/23 Potential to Achieve Goals: Good  Plan       Co-evaluation                 AM-PAC OT "6 Clicks" Daily Activity     Outcome Measure   Help from another person eating meals?: None Help from another person taking care of personal grooming?: A Little Help from another person toileting, which includes using toliet, bedpan, or urinal?: A Little Help from another person bathing (including washing, rinsing, drying)?: A Lot Help from another person to put on and taking off regular upper body clothing?: None Help from another person to put on and taking off regular lower body clothing?: A Lot 6 Click Score: 18    End of Session    OT Visit Diagnosis: Unsteadiness on feet (R26.81);Muscle weakness (generalized) (M62.81);Other (comment)   Activity Tolerance Patient tolerated treatment well   Patient Left in chair;with call bell/phone within reach;with family/visitor present   Nurse Communication          Time: 9629-5284 OT Time Calculation (min): 21 min  Charges: OT General Charges $OT Visit: 1 Visit OT Treatments $Therapeutic Activity: 8-22 mins  Bradd Canary, OTR/L Acute Rehab Services Office: [phone]   Lorre Munroe 08/15/2023, 2:27 PM

## 2023-08-15 NOTE — TOC Progression Note (Signed)
Transition of Care [facility]) - Progression Note    Patient Details  Name: Andrew Schmitt MRN: [id number] Date of Birth: 01/27/1951  Transition of Care [facility]) CM/SW Contact  Leone Haven, RN Phone Number: 08/15/2023, 2:09 PM  Clinical Narrative:    Per patient the DME has been delivered to the home.   Expected Discharge Plan: Home/Self Care Barriers to Discharge: Continued Medical Work up  Expected Discharge Plan and Services In-house Referral: NA Discharge Planning Services: CM Consult Post Acute Care Choice: NA Living arrangements for the past 2 months: Single Family Home Expected Discharge Date: 08/12/23               DME Arranged: N/A DME Agency: NA       HH Arranged: NA           Social Determinants of Health (SDOH) Interventions SDOH Screenings   Food Insecurity: No Food Insecurity (07/22/2023)  Housing: Low Risk  (07/22/2023)  Transportation Needs: No Transportation Needs (07/22/2023)  Utilities: Not At Risk (07/22/2023)  Alcohol Screen: Low Risk  (07/07/2023)  Depression (PHQ2-9): Low Risk  (07/07/2023)  Financial Resource Strain: Low Risk  (07/07/2023)  Physical Activity: Insufficiently Active (07/07/2023)  Social Connections: Moderately Integrated (07/07/2023)  Stress: No Stress Concern Present (07/07/2023)  Tobacco Use: Low Risk  (08/04/2023)  Health Literacy: Adequate Health Literacy (07/07/2023)    Readmission Risk Interventions     No data to display

## 2023-08-16 ENCOUNTER — Encounter (HOSPITAL_COMMUNITY): Payer: Self-pay

## 2023-08-16 LAB — COMPREHENSIVE METABOLIC PANEL
ALT: 18 U/L (ref 0–44)
AST: 27 U/L (ref 15–41)
Albumin: 2.8 g/dL — ABNORMAL LOW (ref 3.5–5.0)
Alkaline Phosphatase: 47 U/L (ref 38–126)
Anion gap: 13 (ref 5–15)
BUN: 95 mg/dL — ABNORMAL HIGH (ref 8–23)
CO2: 22 mmol/L (ref 22–32)
Calcium: 8.7 mg/dL — ABNORMAL LOW (ref 8.9–10.3)
Chloride: 92 mmol/L — ABNORMAL LOW (ref 98–111)
Creatinine, Ser: 6.08 mg/dL — ABNORMAL HIGH (ref 0.61–1.24)
GFR, Estimated: 9 mL/min — ABNORMAL LOW (ref >60)
Glucose, Bld: 103 mg/dL — ABNORMAL HIGH (ref 70–99)
Potassium: 3.9 mmol/L (ref 3.5–5.1)
Sodium: 127 mmol/L — ABNORMAL LOW (ref 135–145)
Total Bilirubin: 0.7 mg/dL (ref 0.0–1.2)
Total Protein: 6.9 g/dL (ref 6.5–8.1)

## 2023-08-16 LAB — TYPE AND SCREEN
ABO/RH(D): A POS
Antibody Screen: NEGATIVE
Unit division: 0
Unit division: 0
Unit division: 0

## 2023-08-16 LAB — BPAM RBC
Blood Product Expiration Date: 202501132359
Blood Product Expiration Date: 202501152359
Blood Product Expiration Date: 202501152359
ISSUE DATE / TIME: 202412290859
ISSUE DATE / TIME: 202412300402
ISSUE DATE / TIME: 202412301034
Unit Type and Rh: 6200
Unit Type and Rh: 6200
Unit Type and Rh: 6200

## 2023-08-16 LAB — CBC WITH DIFFERENTIAL/PLATELET
Abs Immature Granulocytes: 0.04 10*3/uL (ref 0.00–0.07)
Basophils Absolute: 0 10*3/uL (ref 0.0–0.1)
Basophils Relative: 0 %
Eosinophils Absolute: 0.4 10*3/uL (ref 0.0–0.5)
Eosinophils Relative: 5 %
HCT: 25.4 % — ABNORMAL LOW (ref 39.0–52.0)
Hemoglobin: 8.3 g/dL — ABNORMAL LOW (ref 13.0–17.0)
Immature Granulocytes: 1 %
Lymphocytes Relative: 21 %
Lymphs Abs: 1.4 10*3/uL (ref 0.7–4.0)
MCH: 30.3 pg (ref 26.0–34.0)
MCHC: 32.7 g/dL (ref 30.0–36.0)
MCV: 92.7 fL (ref 80.0–100.0)
Monocytes Absolute: 0.9 10*3/uL (ref 0.1–1.0)
Monocytes Relative: 12 %
Neutro Abs: 4.1 10*3/uL (ref 1.7–7.7)
Neutrophils Relative %: 61 %
Platelets: 82 10*3/uL — ABNORMAL LOW (ref 150–400)
RBC: 2.74 MIL/uL — ABNORMAL LOW (ref 4.22–5.81)
RDW: 16.1 % — ABNORMAL HIGH (ref 11.5–15.5)
WBC: 6.8 10*3/uL (ref 4.0–10.5)
nRBC: 0.3 % — ABNORMAL HIGH (ref 0.0–0.2)

## 2023-08-16 LAB — PHOSPHORUS: Phosphorus: 6 mg/dL — ABNORMAL HIGH (ref 2.5–4.6)

## 2023-08-16 LAB — MAGNESIUM: Magnesium: 2.7 mg/dL — ABNORMAL HIGH (ref 1.7–2.4)

## 2023-08-16 MED ORDER — RENA-VITE PO TABS
1.0000 | ORAL_TABLET | Freq: Every day | ORAL | Status: DC
  Administered 2023-08-16: 1 via ORAL
  Filled 2023-08-16: qty 1

## 2023-08-16 NOTE — Progress Notes (Signed)
 PROGRESS NOTE    Andrew Schmitt  FMW:[id number] DOB: [birth year] DOA: 07/21/2023 PCP: Jarold Medici, MD  Chief Complaint  Patient presents with   Bradycardia   Shortness of Breath   Fatigue    Brief Narrative:   [name] was admitted to the hospital with the working diagnosis of acute heart failure decompensation.    [name] with chronic diastolic CHF, hypertension, dyslipidemia, ascending aortic aneurysm, BPH, CKD 3b/4, chronic anemia, gout, obesity presented to the ED with progressive weakness and shortness of breath for 7 days prior to admission. Worsening dyspnea on exertion, and lower extremity edema. Severe symptoms to the point where he was dyspneic with minimal efforts. EMS was called, he was found lethargic and bradycardic, he was placed on supplemental 02 per non re-breather mask and was transported to the ED.  On his initial ED physical examination his blood pressure was 118/66, HR 65, RR 22 and 02 saturation 100% on supplemental 02. Lungs with no wheezing or rhonchi, increased work of breathing, heart with S1 and S2 present and regular, with no gallops, or rubs, abdomen with no distention and positive lower extremity edema    Na 135, K 4.5 Cl 101, bicarbonate 25, glucose 128, bun 63, cr 3,18  Mg 2.9  BNP 183  High sensitive troponin 337, 278, 309 Wbc 4.3 hgb 10.4 plt 132  Urine analysis SG 1.009. protein 100, negative leukocytes, moderate hgb, RBC >50, wbc 6-10    Chest radiograph with cardiomegaly, bilateral hilar vascular congestion, bilateral pleural effusions, moderate to large on the left and small on the right.  CT chest, abdomen and pelvis with no aneurysm identified, moderate left pleural effusion and small right pleural effusion with compressive atelectasis on the left lower lobe and portion of the left upper lobe.  Mild bilateral perinephric stranding.  Mild diffuse body wall edema.  Bilateral renal cysts.   EKG 59 bpm, right axis deviation, qtc 510, sinus  rhythm with first degree AV block, one non conducting P wave, poor R R wave progression, with no significant ST segment or T wave changes.      -12/6: started on diuretics, Coude catheter placed for hematuria w/ CLots and increased discomfort before overt retention -12/7 more encephalopathic, placed on BiPAP, very mild respiratory acidosis, pCO2 in the 60-70 range -Complicated by worsening AKI, nephrology consulting, diuretic dose increased   12/13 improved volume status, but renal function not yet stable.  12/14 persistent elevation in BUN.  12/15 resumed diuretic therapy  12/16 improved volume status and transitioned to oral loop diuretic therapy.  12/17 holding diuretic therapy, continue to have severe BUM elevation, may need renal replacement therapy.  12/18: Gross hematuria with clots after tugging at catheter same morning, Urology consulted, started on continuous bladder irrigation, Foley replaced -12/19, BUN 200 with uremia, temp HD catheter placed, -12/21, CBI clamped -12/22, noted to be in [name] asymptomatic -12/22 overnight CBI resumed for hematuria -12/23, transfused 1 unit PRBC on dialysis 12/24, CBI clamped, going for tunneled dialysis catheter 12/29 worsening hematuria, transfused 12/30 transfused, plan for hematuria pending urology  12/31 CT cystogram, awaiting voiding trial   Maybe able to discharge 1/1 if Hb stable and cleared by urology.  Assessment & Plan:   Principal Problem:   Acute on chronic diastolic CHF (congestive heart failure) (HCC) Active Problems:   Acute kidney injury superimposed on chronic kidney disease (HCC)   Second degree AV block, Mobitz type I   BPH (benign prostatic hyperplasia)   Obesity, class 2  Malnutrition of moderate degree  Acute Blood Loss Anemia Anemia of chronic renal disease Hematuria  Follow up with urology recommendations.  Discussed with Dr. Roseann today. S/p 1 unit pRBC 12/23, 1 unit 12/29, 1 unit 12/30. Stable Hb  today management per urology -> suboptimal CT cystogram due to attenuation of the instilled contrast and extensive streak artifact - single 8 mm filling defect at the interface between the instilled contrast and the un opacified bladder contents.  This may reflect residual blood clot.  Possible voiding trial if urine clear this afternoon? Appropriate retic response Labs consistent with chronic disease   Acute on chronic diastolic CHF (congestive heart failure) (HCC) Echo with EF 65-70%, no RWMA, moderate LVH Volume per renal    Acute hypoxemic hyercapnic respiratory failure, due to acute cardiogenic pulmonary edema 12/10 SP left thoracentesis 1100 ml removed.  Volume per renal  Exudative Pleural Effusion Negative cytology - path with B cells with mild kappa excess of uncertain clinical significance -> repeat flow cytometry should be performed if effusion persists/returns Needs follow up chest imaging outpatient and repeat thora if persistent effusion CXR 12/27 with small L effusion    Metabolic encephalopathy has resolved.    Acute kidney injury superimposed on chronic kidney disease IIIb/IV Patient with progressive azotemia and uremia, required renal replacement therapy.  Planning for HD TTS Per renal, appreciate assistance   Thrombocytopenia Noted, monitor - some chronicity to this, though has downtrended some here - stable today  Second degree AV block, Mobitz type I Continue to hold on AV blockers. Continue telemetry.   BPH (benign prostatic hyperplasia) Continue with flomax  and proscar   Bladder catheter in place.    Obesity, class 2 Body mass index is 35.33 kg/m.   Malnutrition of moderate degree Continue with nutritional supplements.      DVT prophylaxis: SCD Code Status: full Family Communication: none - daughter over phone again today Disposition:   Status is: Inpatient Remains inpatient appropriate because: need for continued inpatient care    Consultants:  Renal   Procedures:  Echo IMPRESSIONS     1. Left ventricular ejection fraction, by estimation, is 65 to 70%. The  left ventricle has normal function. The left ventricle has no regional  wall motion abnormalities. There is moderate left ventricular hypertrophy.  Left ventricular diastolic  parameters are indeterminate.   2. Right ventricular systolic function is normal. The right ventricular  size is normal.   3. Left atrial size was moderately dilated.   4. The mitral valve is abnormal. Trivial mitral valve regurgitation. No  evidence of mitral stenosis.   5. The aortic valve is tricuspid. There is mild calcification of the  aortic valve. Aortic valve regurgitation is not visualized. Aortic valve  sclerosis is present, with no evidence of aortic valve stenosis.   6. The inferior vena cava is normal in size with greater than 50%  respiratory variability, suggesting right atrial pressure of 3 mmHg.   12/10 thoracentesis  Antimicrobials:  Anti-infectives (From admission, onward)    Start     Dose/Rate Route Frequency Ordered Stop   08/09/23 1030  ceFAZolin  (ANCEF ) IVPB 2g/100 mL premix        2 g 200 mL/hr over 30 Minutes Intravenous  Once 08/09/23 0943 08/10/23 0949       Subjective: Seen during dialysis, no complaints  Objective: Vitals:   08/16/23 1553 08/16/23 1600 08/16/23 1630 08/16/23 1700  BP: (!) 86/59 95/61 96/64  103/63  Pulse: 74 74 77 81  Resp: ROLLEN)  21 (!) 23 20 20   Temp:      TempSrc:      SpO2: 99% 100% 100% 99%  Weight:      Height:        Intake/Output Summary (Last 24 hours) at 08/16/2023 1712 Last data filed at 08/16/2023 1300 Gross per 24 hour  Intake 3480 ml  Output 4100 ml  Net -620 ml   Filed Weights   08/14/23 0539 08/16/23 0612 08/16/23 1437  Weight: 102.6 kg 100.5 kg 99.3 kg    Examination:  General: No acute distress. Seen on dialysis. Lungs: unlabored Neurological: Alert and oriented 3. Moves all extremities  4 with equal strength. Cranial nerves II through XII grossly intact. Extremities: No clubbing or cyanosis. No edema.   Data Reviewed: I have personally reviewed following labs and imaging studies  CBC: Recent Labs  Lab 08/12/23 0246 08/13/23 0245 08/13/23 1004 08/13/23 2056 08/14/23 0148 08/14/23 1612 08/15/23 0224 08/16/23 0322  WBC 5.7 5.4 5.2  --  6.0  --  8.0 6.8  NEUTROABS 3.7  --   --   --  3.6  --  4.8 4.1  HGB 8.0* 7.9* 8.3* 8.1* 7.1* 8.1* 6.9* 8.3*  HCT 25.5* 24.9* 26.2* 25.2* 22.3* 25.8* 21.7* 25.4*  MCV 95.1 95.0 94.6  --  95.7  --  96.4 92.7  PLT 105* 107* 103*  --  91*  --  82* 82*    Basic Metabolic Panel: Recent Labs  Lab 08/12/23 0246 08/13/23 0245 08/14/23 0148 08/15/23 0224 08/16/23 0322  NA 131* 129* 129* 130* 127*  K 4.0 4.1 3.8 4.1 3.9  CL 94* 91* 93* 94* 92*  CO2 25 24 27 24 22   GLUCOSE 106* 108* 120* 108* 103*  BUN 59* 81* 47* 79* 95*  CREATININE 4.18* 6.12* 4.35* 6.43* 6.08*  CALCIUM  8.9 9.2 8.5* 8.7* 8.7*  MG 2.3  --  2.2 2.5* 2.7*  PHOS 4.4 5.9* 3.8 6.1* 6.0*    GFR: Estimated Creatinine Clearance: 11.8 mL/min (Shakyia Bosso) (by C-G formula based on SCr of 6.08 mg/dL (H)).  Liver Function Tests: Recent Labs  Lab 08/10/23 0244 08/11/23 0240 08/12/23 0246 08/13/23 0245 08/14/23 0148 08/15/23 0224 08/16/23 0322  AST 29  --  26  --  38 31 27  ALT 26  --  16  --  21 19 18   ALKPHOS 55  --  53  --  45 49 47  BILITOT 0.9  --  0.6  --  0.2 2.0* 0.7  PROT 7.8  --  7.8  --  7.2 6.8 6.9  ALBUMIN  3.0*   < > 3.0* 3.0* 2.7* 2.8* 2.8*   < > = values in this interval not displayed.    CBG: No results for input(s): GLUCAP in the last 168 hours.   No results found for this or any previous visit (from the past 240 hours).       Radiology Studies: CT CYSTOGRAM PELVIS Result Date: 08/15/2023 CLINICAL DATA:  Assessing clot burden to advise possible surgery tomorrow EXAM: CT CYSTOGRAM (CT PELVIS WITH CONTRAST) TECHNIQUE: Multidetector CT  imaging through the pelvis was performed after dilute contrast had been introduced into the bladder for the purposes of performing CT cystography. RADIATION DOSE REDUCTION: This exam was performed according to the departmental dose-optimization program which includes automated exposure control, adjustment of the mA and/or kV according to patient size and/or use of iterative reconstruction technique. CONTRAST:  50mL OMNIPAQUE  IOHEXOL  350 MG/ML SOLN COMPARISON:  08/03/2023 FINDINGS:  Urinary Tract: There is an indwelling Foley catheter within the bladder lumen, with dense contrast instilled for CT cystography. Given the density of the contrast and associated streak artifact, evaluation of the bladder lumen is limited. Brandii Lakey single 8 mm filling defect is seen at the interface between the dense dependent contrast and the unopacified bladder contents anteriorly. No evidence of bladder wall thickening. The distal ureters are unremarkable. Bowel: No bowel obstruction or ileus. Diffuse colonic diverticulosis without diverticulitis. Normal appendix right lower quadrant. Moderate retained stool throughout the colon compatible with constipation. Vascular/Lymphatic: Stable atherosclerosis.  Pathologic adenopathy. Reproductive:  Stable mildly enlarged prostate. Other: No free fluid or free intraperitoneal gas. No abdominal wall hernia. Musculoskeletal: No acute or destructive bony abnormalities. Stable degenerative changes of the lower lumbar spine and bilateral hips. Reconstructed images demonstrate no additional findings. IMPRESSION: 1. Suboptimal CT cystogram due to the attenuation of the instilled contrast and extensive streak artifact. There is Cherylin Waguespack single 8 mm filling defect at the interface between the instilled contrast and the un opacified bladder contents. This may reflect residual blood clot. 2. Colonic diverticulosis without diverticulitis. 3. Moderate retained stool consistent with constipation. 4. Stable enlarged prostate.  5.  Aortic Atherosclerosis (ICD10-I70.0). Electronically Signed   By: Ozell Daring M.D.   On: 08/15/2023 16:00         Scheduled Meds:  calcium  acetate  667 mg Oral TID WC   Chlorhexidine  Gluconate Cloth  6 each Topical Daily   Chlorhexidine  Gluconate Cloth  6 each Topical Q0600   darbepoetin (ARANESP ) injection - DIALYSIS  100 mcg Subcutaneous Q Thu-1800   feeding supplement  237 mL Oral BID BM   finasteride   5 mg Oral Daily   multivitamin  1 tablet Oral QHS   polyethylene glycol  17 g Oral BID   rosuvastatin   10 mg Oral Daily   tamsulosin   0.4 mg Oral Daily   Continuous Infusions:  heparin  sodium (porcine)     iron  sucrose 200 mg (08/16/23 1637)   sodium chloride  irrigation       LOS: 25 days    Time spent: over 30 min    Meliton Monte, MD [facility]   To contact the attending provider between 7A-7P or the covering provider during after hours 7P-7A, please log into the web site [email or web address] and access using universal [place] password for that web site. If you do not have the password, please call the hospital operator.  08/16/2023, 5:12 PM

## 2023-08-16 NOTE — Progress Notes (Signed)
 Patient ID: Andrew Schmitt, male   DOB: 14-May-1951, 72 y.o.   MRN: [id number] [facility] Progress Note   Assessment/ Plan:   1.  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   Continue to hold ARB and SGLT2 inhibitor for now.   With progressive azotemia and uremia (asterixus) even after holding all diuretics therefore started RRT 08/04/23 with RIJ Temp -  now tunneled  Doing HD on TTS sched, last on Saturday -  will keep on TTS while here-  next HD Tuesday CLIP:   has been accepted to start at TCU   2.  Acute exacerbation of HFpEF Vol status acceptable/stable , UF as tolerated 3.  Encephalopathy: stable, trend wit HD 4.  Anemia: Secondary to chronic disease including chronic kidney disease.  IV iron  was given; CTM; ESA qThursday--dose inc'd. S/p 1U prbc 12/23 and 12/29 5.  Hypertension: Blood pressure stable.  HTN meds stopped 6. Pleural effusion: s/p thora predominantly lymphocytic. Some malignancy concern. First cytology negative for malignant cells 7. Mild hyponatremia-  manage with HD/UF 8. 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 Hematuria, traumatic foley removal: per urology, CBI-  had been stopped-  urine very dark again -  likely need to resume   Subjective:    For HD today.     Objective:   BP (!) 99/59 (BP Location: Left Arm)   Pulse 71   Temp 98.5 F (36.9 C) (Oral)   Resp 18   Ht 5' 6 (1.676 m)   Wt 100.5 kg   SpO2 99%   BMI 35.75 kg/m   Intake/Output Summary (Last 24 hours) at 08/16/2023 1137 Last data filed at 08/16/2023 0800 Gross per 24 hour  Intake 3597 ml  Output 5750 ml  Net -2153 ml   Weight change:   Physical Exam: Gen: NAD, lying in bed CVS: normal rate, no murmurs Resp: cta b/l Abd: Soft, obese, nontender Ext: very trace bilateral lower extremity edema   Dialysis access: Rt TDC  Urine sediment exam on 12/13: 10-20 RBCs per high-powered field none with dysmorphic features, rare renal tubular epithelial cells, rare WBCs, no muddy brown casts, no cellular casts, 1 lipid-laden cast.  Imaging: CT CYSTOGRAM PELVIS Result Date: 08/15/2023 CLINICAL DATA:  Assessing clot burden to advise possible surgery tomorrow EXAM: CT CYSTOGRAM (CT PELVIS WITH CONTRAST) TECHNIQUE: Multidetector CT imaging through the pelvis was performed after dilute contrast had been introduced into the bladder for the purposes of performing CT cystography. RADIATION DOSE REDUCTION: This exam was performed according to the departmental dose-optimization program which includes automated exposure control, adjustment of the mA and/or kV according to patient size and/or use of iterative reconstruction technique. CONTRAST:  50mL OMNIPAQUE  IOHEXOL  350 MG/ML SOLN COMPARISON:  08/03/2023 FINDINGS: Urinary Tract: There is an indwelling Foley catheter within the bladder lumen, with dense contrast instilled for CT cystography. Given the density of the contrast and associated streak artifact, evaluation of the bladder lumen is limited. A single 8 mm filling defect is seen at the interface between the dense dependent contrast and the unopacified bladder contents anteriorly. No evidence of bladder wall thickening. The distal ureters are unremarkable. Bowel: No bowel obstruction or ileus. Diffuse colonic diverticulosis without diverticulitis. Normal appendix right lower quadrant. Moderate retained stool throughout the colon compatible with constipation. Vascular/Lymphatic: Stable atherosclerosis.  Pathologic adenopathy. Reproductive:  Stable  mildly enlarged prostate. Other: No free fluid or free intraperitoneal gas. No abdominal wall hernia. Musculoskeletal: No acute or destructive bony abnormalities. Stable degenerative changes of the lower lumbar spine and bilateral hips. Reconstructed images demonstrate no  additional findings. IMPRESSION: 1. Suboptimal CT cystogram due to the attenuation of the instilled contrast and extensive streak artifact. There is a single 8 mm filling defect at the interface between the instilled contrast and the un opacified bladder contents. This may reflect residual blood clot. 2. Colonic diverticulosis without diverticulitis. 3. Moderate retained stool consistent with constipation. 4. Stable enlarged prostate. 5.  Aortic Atherosclerosis (ICD10-I70.0). Electronically Signed   By: Ozell Daring M.D.   On: 08/15/2023 16:00        Labs: BMET Recent Labs  Lab 08/10/23 0244 08/11/23 0240 08/12/23 0246 08/13/23 0245 08/14/23 0148 08/15/23 0224 08/16/23 0322  NA 132* 130* 131* 129* 129* 130* 127*  K 4.2 4.0 4.0 4.1 3.8 4.1 3.9  CL 94* 92* 94* 91* 93* 94* 92*  CO2 24 22 25 24 27 24 22   GLUCOSE 107* 103* 106* 108* 120* 108* 103*  BUN 98* 115* 59* 81* 47* 79* 95*  CREATININE 5.57* 5.66* 4.18* 6.12* 4.35* 6.43* 6.08*  CALCIUM  9.3 8.9 8.9 9.2 8.5* 8.7* 8.7*  PHOS  --  5.6* 4.4 5.9* 3.8 6.1* 6.0*   CBC Recent Labs  Lab 08/12/23 0246 08/13/23 0245 08/13/23 1004 08/13/23 2056 08/14/23 0148 08/14/23 1612 08/15/23 0224 08/16/23 0322  WBC 5.7   < > 5.2  --  6.0  --  8.0 6.8  NEUTROABS 3.7  --   --   --  3.6  --  4.8 4.1  HGB 8.0*   < > 8.3*   < > 7.1* 8.1* 6.9* 8.3*  HCT 25.5*   < > 26.2*   < > 22.3* 25.8* 21.7* 25.4*  MCV 95.1   < > 94.6  --  95.7  --  96.4 92.7  PLT 105*   < > 103*  --  91*  --  82* 82*   < > = values in this interval not displayed.    Medications:     calcium  acetate  667 mg Oral TID WC   Chlorhexidine  Gluconate Cloth  6 each Topical Daily   Chlorhexidine  Gluconate Cloth  6 each Topical Q0600   darbepoetin (ARANESP ) injection - DIALYSIS  100 mcg Subcutaneous Q Thu-1800   feeding supplement  237 mL Oral BID BM   finasteride   5 mg Oral Daily   polyethylene glycol  17 g Oral BID   rosuvastatin   10 mg Oral Daily   tamsulosin   0.4 mg  Oral Daily   Andrew Schmitt  08/16/2023, 11:37 AM

## 2023-08-16 NOTE — Progress Notes (Signed)
 21 Days Post-Op Subjective: NAEON. Spoke with patient, nephew, and his daughter by phone.   Objective: Vital signs in last 24 hours: Temp:  [98 F (36.7 C)-98.5 F (36.9 C)] 98.5 F (36.9 C) (12/31 0612) Pulse Rate:  [71-78] 71 (12/31 0612) Resp:  [17-22] 18 (12/31 0612) BP: (96-120)/(59-73) 99/59 (12/31 0612) SpO2:  [98 %-100 %] 99 % (12/31 0612) Weight:  [100.5 kg] 100.5 kg (12/31 0612)  Assessment/Plan:  Intake/Output from previous day: 12/30 0701 - 12/31 0700 In: 4191 [P.O.:480; I.V.:30; Blood:681] Out: 4350 [Urine:4350] Expand All Collapse All     20 Days Post-Op Subjective: Patient up in chair with CBI on low gtt. with lightly blood-tinged irrigant.  Spoke with his daughter, who is an ER physician, over the phone reviewing the case and plan.  All parties were in agreement.   Objective: Vital signs in last 24 hours: Temp:  [97.6 F (36.4 C)-98.7 F (37.1 C)] 98.2 F (36.8 C) (12/30 1047) Pulse Rate:  [75-83] 75 (12/30 1047) Resp:  [18-20] 20 (12/30 1047) BP: (91-117)/(52-70) 106/68 (12/30 1047) SpO2:  [96 %-100 %] 100 % (12/30 1047)   Assessment/Plan: # Hematuria # Traumatic Foley removal # Uremic bleeding- resolved # Clot obstruction of urine   Over wire placement of council hematuria catheter fashioned from a 98f Rusch.    Only around 50cc of clot material hand irrigated.  Catheter is somewhat positional and nearly hubbed despite a calculated prostate size around 60 to 75g.   CT hematuria collected 12/18 does reveal some residual clot burden which I was unable to evacuate with hand irrigation. Clear urine for several days, then with some clot mobilization by hand irrigation and expulsion around foley. Drop in Hgb x2 days with mild hematuria reported. Irrigant very lightly tinged with old blood. CBI on lowest possible setting. This does not represent frank bleeding in character, not would it's quantity be responsible for his amount of HgB reduction.     Cystogram shows one small 8mm area that may represent clot material. No indication for surgery. Minor rust colored tinge to urine. CBI off. Will proceed with voiding trial this afternoon if urine remains clear.    Follow up w/ Dr. Carolee 1-2 weeks after discharge     Intake/Output this shift: Total I/O In: -  Out: 1400 [Urine:1400]  Physical Exam:  General: Alert and oriented CV: No cyanosis Lungs: equal chest rise Gu: CBI off, lightly rust tinged urine  Lab Results: Recent Labs    08/14/23 1612 08/15/23 0224 08/16/23 0322  HGB 8.1* 6.9* 8.3*  HCT 25.8* 21.7* 25.4*   BMET Recent Labs    08/15/23 0224 08/16/23 0322  NA 130* 127*  K 4.1 3.9  CL 94* 92*  CO2 24 22  GLUCOSE 108* 103*  BUN 79* 95*  CREATININE 6.43* 6.08*  CALCIUM  8.7* 8.7*     Studies/Results: CT CYSTOGRAM PELVIS Result Date: 08/15/2023 CLINICAL DATA:  Assessing clot burden to advise possible surgery tomorrow EXAM: CT CYSTOGRAM (CT PELVIS WITH CONTRAST) TECHNIQUE: Multidetector CT imaging through the pelvis was performed after dilute contrast had been introduced into the bladder for the purposes of performing CT cystography. RADIATION DOSE REDUCTION: This exam was performed according to the departmental dose-optimization program which includes automated exposure control, adjustment of the mA and/or kV according to patient size and/or use of iterative reconstruction technique. CONTRAST:  50mL OMNIPAQUE  IOHEXOL  350 MG/ML SOLN COMPARISON:  08/03/2023 FINDINGS: Urinary Tract: There is an indwelling Foley catheter within the bladder lumen,  with dense contrast instilled for CT cystography. Given the density of the contrast and associated streak artifact, evaluation of the bladder lumen is limited. A single 8 mm filling defect is seen at the interface between the dense dependent contrast and the unopacified bladder contents anteriorly. No evidence of bladder wall thickening. The distal ureters are unremarkable. Bowel:  No bowel obstruction or ileus. Diffuse colonic diverticulosis without diverticulitis. Normal appendix right lower quadrant. Moderate retained stool throughout the colon compatible with constipation. Vascular/Lymphatic: Stable atherosclerosis.  Pathologic adenopathy. Reproductive:  Stable mildly enlarged prostate. Other: No free fluid or free intraperitoneal gas. No abdominal wall hernia. Musculoskeletal: No acute or destructive bony abnormalities. Stable degenerative changes of the lower lumbar spine and bilateral hips. Reconstructed images demonstrate no additional findings. IMPRESSION: 1. Suboptimal CT cystogram due to the attenuation of the instilled contrast and extensive streak artifact. There is a single 8 mm filling defect at the interface between the instilled contrast and the un opacified bladder contents. This may reflect residual blood clot. 2. Colonic diverticulosis without diverticulitis. 3. Moderate retained stool consistent with constipation. 4. Stable enlarged prostate. 5.  Aortic Atherosclerosis (ICD10-I70.0). Electronically Signed   By: Ozell Daring M.D.   On: 08/15/2023 16:00      LOS: 25 days   Ole Bourdon, NP [facility] Pager: [phone]  08/16/2023, 10:07 AM

## 2023-08-16 NOTE — Progress Notes (Signed)
 Nutrition Follow-up  DOCUMENTATION CODES:   Non-severe (moderate) malnutrition in context of acute illness/injury  INTERVENTION:   -Continue renal diet with 1,200 mL fluid restriction.  -Continue Ensure Enlive po BID, each supplement provides 350 kcal and 20 grams of protein.  -Continue Magic cup TID with meals, each supplement provides 290 kcal and 9 grams of protein  -Rena-Vit for micronutrient support with dialysis.    NUTRITION DIAGNOSIS:   Moderate Malnutrition related to acute illness as evidenced by energy intake < 75% for > 7 days, mild muscle depletion, edema.  -ongoing, addressing with diet and oral supplements  GOAL:   Patient will meet greater than or equal to 90% of their needs  -progressing  MONITOR:   PO intake, Supplement acceptance, Weight trends  REASON FOR ASSESSMENT:   Consult Diet education (new HD, AKI)  ASSESSMENT:   Presented to ED on 12/05 for evaluation r/t bradycardia, generalized weakness and SOB. Found to have acute on chronic CHF w/ L pleural effusion, AKI on CKD III, acute hypoxemic hypercapnic respiratory failure. Has started RRT.  PMH: chronic HFpEF, ascending aortic aneurysm, HTN, HLD, CKD III, gout, obesity.  12/30 transfused, plan for hematuria pending urology 12/29 worsening hematuria, transfused 12/24 - permcath placement scheduled 12/20 - Started RRT 12/19 - temporary cath placement 12/10 - L thoracentesis, drained 12/7 - worsening AKI, placed on BiPAP 12/6 - Foley catheter placed 12/5 - presented to [facility] ED  Intakes recorded average 76% x 8 meals. Patient is out of her room for dialysis.  Written education materials left on bedside table, Nutrition for people on dialysis. She received renal diet education on 08/09/23 with different education handouts from the documents provided today.    Current weight trended down 32# from admit related to fluid removal on dialysis, unclear dry weight. Reported usual weight prior to  dialysis 103.2 kg (07/07/23), monitor. Edema 1+ RLE, LLE.  Medications reviewed and include phoslo  3x daily with meals, Darbepoetin Alfa , Miralax , iron  sucrose 200 mg with dialysis 3x weekly.  Labs: sodium 127, BUN 95, creatinine 6.08, GFR 9, phosphorus 6.0, magnesium 2.7   Diet Order:   Diet Order             Diet renal with fluid restriction Fluid restriction: 1200 mL Fluid; Room service appropriate? Yes; Fluid consistency: Thin  Diet effective now           Diet - low sodium heart healthy                   EDUCATION NEEDS:   Education needs have been addressed, attempted to review education provided on 08/09/23, patient out of room.  Skin:  Skin Assessment: Reviewed RN Assessment  Last BM:  08/16/2023, type 7-large  Height:   Ht Readings from Last 1 Encounters:  07/26/23 5' 6 (1.676 m)    Weight:   Wt Readings from Last 1 Encounters:  08/16/23 99.3 kg    Ideal Body Weight:  64.5 kg  BMI:  Body mass index is 35.33 kg/m.  Estimated Nutritional Needs:   Kcal:  1900-2100kcal  Protein:  95-105g  Fluid:  <1.2L    Elveria Sable, RDLD Clinical Dietitian If unable to reach, please contact RD Inpatient secure chat group between 8 am-4 pm daily

## 2023-08-16 NOTE — Progress Notes (Signed)
Pt can start at TCU at [facility] on Thursday if stable for d/c by then. Will assist as needed.  Olivia Canter Renal Navigator [phone]

## 2023-08-16 NOTE — Progress Notes (Signed)
   08/16/23 1800  Vitals  Temp 98.1 F (36.7 C)  Pulse Rate 80  Resp (!) 25  BP 105/61  SpO2 100 %  O2 Device Room Air  Oxygen Therapy  Patient Activity (if Appropriate) In bed  Pulse Oximetry Type Continuous  Oximetry Probe Site Changed No  Post Treatment  Dialyzer Clearance Lightly streaked  Hemodialysis Intake (mL) 0 mL  Liters Processed 71.9  Fluid Removed (mL) 400 mL  Tolerated HD Treatment Yes  AVG/AVF Arterial Site Held (minutes) 0 minutes  AVG/AVF Venous Site Held (minutes) 0 minutes   Received patient in bed to unit.  Alert and oriented.  Informed consent signed and in chart.   TX duration:3.0  Patient tolerated well.  Transported back to the room  Alert, without acute distress.  Hand-off given to patient's nurse.   Access used: [email or web address] Access issues: no complications  Total UF removed: 400--nephrology aware Medication(s) given: venofer  100mg  iv x 1   Andrew Schmitt [facility]

## 2023-08-17 LAB — CBC WITH DIFFERENTIAL/PLATELET
Abs Immature Granulocytes: 0.03 10*3/uL (ref 0.00–0.07)
Basophils Absolute: 0 10*3/uL (ref 0.0–0.1)
Basophils Relative: 1 %
Eosinophils Absolute: 0.3 10*3/uL (ref 0.0–0.5)
Eosinophils Relative: 6 %
HCT: 25.5 % — ABNORMAL LOW (ref 39.0–52.0)
Hemoglobin: 8.3 g/dL — ABNORMAL LOW (ref 13.0–17.0)
Immature Granulocytes: 1 %
Lymphocytes Relative: 21 %
Lymphs Abs: 1.2 10*3/uL (ref 0.7–4.0)
MCH: 30.4 pg (ref 26.0–34.0)
MCHC: 32.5 g/dL (ref 30.0–36.0)
MCV: 93.4 fL (ref 80.0–100.0)
Monocytes Absolute: 0.8 10*3/uL (ref 0.1–1.0)
Monocytes Relative: 13 %
Neutro Abs: 3.4 10*3/uL (ref 1.7–7.7)
Neutrophils Relative %: 58 %
Platelets: 82 10*3/uL — ABNORMAL LOW (ref 150–400)
RBC: 2.73 MIL/uL — ABNORMAL LOW (ref 4.22–5.81)
RDW: 15.9 % — ABNORMAL HIGH (ref 11.5–15.5)
WBC: 5.8 10*3/uL (ref 4.0–10.5)
nRBC: 0 % (ref 0.0–0.2)

## 2023-08-17 LAB — COMPREHENSIVE METABOLIC PANEL WITH GFR
ALT: 19 U/L (ref 0–44)
AST: 31 U/L (ref 15–41)
Albumin: 2.9 g/dL — ABNORMAL LOW (ref 3.5–5.0)
Alkaline Phosphatase: 49 U/L (ref 38–126)
Anion gap: 10 (ref 5–15)
BUN: 61 mg/dL — ABNORMAL HIGH (ref 8–23)
CO2: 26 mmol/L (ref 22–32)
Calcium: 8.7 mg/dL — ABNORMAL LOW (ref 8.9–10.3)
Chloride: 94 mmol/L — ABNORMAL LOW (ref 98–111)
Creatinine, Ser: 4.26 mg/dL — ABNORMAL HIGH (ref 0.61–1.24)
GFR, Estimated: 14 mL/min — ABNORMAL LOW
Glucose, Bld: 102 mg/dL — ABNORMAL HIGH (ref 70–99)
Potassium: 3.6 mmol/L (ref 3.5–5.1)
Sodium: 130 mmol/L — ABNORMAL LOW (ref 135–145)
Total Bilirubin: 0.5 mg/dL (ref 0.0–1.2)
Total Protein: 7.3 g/dL (ref 6.5–8.1)

## 2023-08-17 LAB — MAGNESIUM: Magnesium: 2.3 mg/dL (ref 1.7–2.4)

## 2023-08-17 LAB — PHOSPHORUS: Phosphorus: 5 mg/dL — ABNORMAL HIGH (ref 2.5–4.6)

## 2023-08-17 MED ORDER — SIMETHICONE 80 MG PO CHEW
80.0000 mg | CHEWABLE_TABLET | Freq: Once | ORAL | Status: AC
  Administered 2023-08-17: 80 mg via ORAL
  Filled 2023-08-17: qty 1

## 2023-08-17 MED ORDER — LOPERAMIDE HCL 2 MG PO CAPS: 2.0000 mg | ORAL_CAPSULE | ORAL | Status: DC | PRN

## 2023-08-17 NOTE — Progress Notes (Signed)
 Patient ID: Andrew Schmitt, male   DOB: 27-Sep-1950, 73 y.o.   MRN: [id number] [facility] Progress Note   Assessment/ Plan:   1.  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   Continue to hold ARB and SGLT2 inhibitor for now.   With progressive azotemia and uremia (asterixus) even after holding all diuretics therefore started RRT 08/04/23 with RIJ Temp -  now tunneled  Doing HD on TTS sched CLIP:   has been accepted to start at TCU   2.  Acute exacerbation of HFpEF Vol status acceptable/stable , UF as tolerated 3.  Encephalopathy: stable, trend wit HD 4.  Anemia: Secondary to chronic disease including chronic kidney disease.  IV iron  was given; CTM; ESA qThursday--dose inc'd. S/p 1U prbc 12/23 and 12/29.  On IV iron  and Aranesp  too 5.  Hypertension: Blood pressure stable.  HTN meds stopped 6. Pleural effusion: s/p thora predominantly lymphocytic. Some malignancy concern. First cytology negative for malignant cells 7. Mild hyponatremia-  manage with HD/UF 8. 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 Hematuria, traumatic foley removal: s/p CBI, TOV ongoing  Subjective:    Had HD yesterday, tolerated well.  Today had Foley removed, TOV ongoing.  Hgb stable today at 8.3   Objective:   BP 111/69 (BP Location: Left Arm)   Pulse 73   Temp 98.4 F (36.9 C) (Oral)   Resp 20   Ht 5' 6 (1.676 m)   Wt 97 kg   SpO2 98%   BMI 34.52 kg/m   Intake/Output Summary (Last 24 hours) at 08/17/2023 1138 Last data filed at 08/16/2023 1800 Gross per 24 hour  Intake 240 ml  Output 400 ml  Net -160 ml   Weight change: -1.172 kg  Physical Exam: Gen: NAD, lying in bed CVS: normal rate, no murmurs Resp: cta b/l Abd: Soft, obese, nontender Ext: very trace bilateral lower extremity edema  Dialysis access: Rt  TDC  Urine sediment exam on 12/13: 10-20 RBCs per high-powered field none with dysmorphic features, rare renal tubular epithelial cells, rare WBCs, no muddy brown casts, no cellular casts, 1 lipid-laden cast.  Imaging: CT CYSTOGRAM PELVIS Result Date: 08/15/2023 CLINICAL DATA:  Assessing clot burden to advise possible surgery tomorrow EXAM: CT CYSTOGRAM (CT PELVIS WITH CONTRAST) TECHNIQUE: Multidetector CT imaging through the pelvis was performed after dilute contrast had been introduced into the bladder for the purposes of performing CT cystography. RADIATION DOSE REDUCTION: This exam was performed according to the departmental dose-optimization program which includes automated exposure control, adjustment of the mA and/or kV according to patient size and/or use of iterative reconstruction technique. CONTRAST:  50mL OMNIPAQUE  IOHEXOL  350 MG/ML SOLN COMPARISON:  08/03/2023 FINDINGS: Urinary Tract: There is an indwelling Foley catheter within the bladder lumen, with dense contrast instilled for CT cystography. Given the density of the contrast and associated streak artifact, evaluation of the bladder lumen is limited. A single 8 mm filling defect is seen at the interface between the dense dependent contrast and the unopacified bladder contents anteriorly. No evidence of bladder wall thickening. The distal ureters are unremarkable. Bowel: No bowel obstruction or ileus. Diffuse colonic diverticulosis without diverticulitis. Normal appendix right lower quadrant. Moderate retained stool throughout the colon compatible with constipation. Vascular/Lymphatic: Stable atherosclerosis.  Pathologic adenopathy. Reproductive:  Stable mildly enlarged prostate. Other: No free fluid or free intraperitoneal  gas. No abdominal wall hernia. Musculoskeletal: No acute or destructive bony abnormalities. Stable degenerative changes of the lower lumbar spine and bilateral hips. Reconstructed images demonstrate no additional findings.  IMPRESSION: 1. Suboptimal CT cystogram due to the attenuation of the instilled contrast and extensive streak artifact. There is a single 8 mm filling defect at the interface between the instilled contrast and the un opacified bladder contents. This may reflect residual blood clot. 2. Colonic diverticulosis without diverticulitis. 3. Moderate retained stool consistent with constipation. 4. Stable enlarged prostate. 5.  Aortic Atherosclerosis (ICD10-I70.0). Electronically Signed   By: Andrew Schmitt M.D.   On: 08/15/2023 16:00        Labs: BMET Recent Labs  Lab 08/11/23 0240 08/12/23 0246 08/13/23 0245 08/14/23 0148 08/15/23 0224 08/16/23 0322 08/17/23 0837  NA 130* 131* 129* 129* 130* 127* 130*  K 4.0 4.0 4.1 3.8 4.1 3.9 3.6  CL 92* 94* 91* 93* 94* 92* 94*  CO2 22 25 24 27 24 22 26   GLUCOSE 103* 106* 108* 120* 108* 103* 102*  BUN 115* 59* 81* 47* 79* 95* 61*  CREATININE 5.66* 4.18* 6.12* 4.35* 6.43* 6.08* 4.26*  CALCIUM  8.9 8.9 9.2 8.5* 8.7* 8.7* 8.7*  PHOS 5.6* 4.4 5.9* 3.8 6.1* 6.0* 5.0*   CBC Recent Labs  Lab 08/14/23 0148 08/14/23 1612 08/15/23 0224 08/16/23 0322 08/17/23 0837  WBC 6.0  --  8.0 6.8 5.8  NEUTROABS 3.6  --  4.8 4.1 3.4  HGB 7.1* 8.1* 6.9* 8.3* 8.3*  HCT 22.3* 25.8* 21.7* 25.4* 25.5*  MCV 95.7  --  96.4 92.7 93.4  PLT 91*  --  82* 82* 82*    Medications:     calcium  acetate  667 mg Oral TID WC   Chlorhexidine  Gluconate Cloth  6 each Topical Daily   Chlorhexidine  Gluconate Cloth  6 each Topical Q0600   darbepoetin (ARANESP ) injection - DIALYSIS  100 mcg Subcutaneous Q Thu-1800   feeding supplement  237 mL Oral BID BM   finasteride   5 mg Oral Daily   multivitamin  1 tablet Oral QHS   polyethylene glycol  17 g Oral BID   rosuvastatin   10 mg Oral Daily   tamsulosin   0.4 mg Oral Daily   Andrew Schmitt  08/17/2023, 11:38 AM

## 2023-08-17 NOTE — Progress Notes (Signed)
 Foley catheter removed at 0620 per order for voiding trial. Patient tolerated removal well; clotted blood noted on removal.

## 2023-08-17 NOTE — Progress Notes (Signed)
Patient discharged in stable condition via wheelchair.

## 2023-08-17 NOTE — Discharge Summary (Signed)
 Physician Discharge Summary   Patient: Andrew Schmitt MRN: [id number] DOB: 1950-09-26  Admit date:     07/21/2023  Discharge date: 08/17/23  Discharge Physician: Elgin Lam, MD   PCP: Jarold Medici, MD   Recommendations at discharge:  PCP visit for hospital follow-up Nephrology visit for hospital follow-up in addition to ongoing hemodialysis Urology visit for hospital follow-up Recommend repeat CBC in 3 to 5 days Repeat thoracentesis with repeat flow cytometry if pleural fluid returns  Discharge Diagnoses: Principal Problem:   Acute on chronic diastolic CHF (congestive heart failure) (HCC) Active Problems:   Acute kidney injury superimposed on chronic kidney disease (HCC)   Second degree AV block, Mobitz type I   BPH (benign prostatic hyperplasia)   Obesity, class 2   Malnutrition of moderate degree  Resolved Problems:   * No resolved hospital problems. *  Hospital Course: Andrew Schmitt is a 73 y.o. male with a history of diastolic heart failure, hypertension, hyperlipidemia, sending aortic aneurysm, BPH, CKD stage IIIb, chronic anemia, gout, obesity.  Patient presented secondary to progressive weakness and shortness of breath was found to have evidence of volume overload evidenced by pleural effusions, pulmonary edema, and diffuse body wall edema.  Patient was initially managed on Lasix  IV which was transitioned to volume management via hemodialysis secondary to patient progression to ESRD and need for hemodialysis.  Hospitalization was complicated by acute blood loss anemia secondary to gross hematuria requiring urology consult and continuous bladder irrigation.  Patient required a total of 4 units of PRBC this admission for recurrent/persistent anemia.  Hematuria resolved prior to discharge.  Patient set up with hemodialysis as an outpatient prior to discharge.   Assessment and Plan:  Acute blood loss anemia Anemia of chronic renal disease Acute anemia secondary to  gross hematuria. Baseline hemoglobin of 10-12 g/dL. Hemoglobin of 10.5 g/dL on admission. Patient required a total of 4 units of PRBC via transfusion during hospitalization for recurrent anemia. Hemoglobin stable at 8.3 g/dL with resolution of hematuria prior to discharge.   Gross hematuria Urology consulted. Foley inserted and patient managed via continuous bladder irrigation. CT cystogram (12/30) was sub-optimal. Foley removed prior to discharge. Patient tolerated voiding trial. Recommendation for patient to follow-up outpatient with urology.   Acute on chronic diastolic heart failure Transthoracic Echocardiogram significant for an LV   Acute respiratory failure with hypoxia Secondary to pulmonary edema. Resolved with treatment.   Acute pulmonary edema Volume was managed with hemodialysis. Resolved.   Exudative pleural effusion Cytology was negative for malignant cells.  Pleural fluid flow cytometry significant for B cells with mild kappa excess of uncertain clinical significance.  Recommendation for repeat flow cytometry if pleural effusion persists or returns.   Metabolic encephalopathy Resolved.   ESRD Patient initially with AKI on CKD with progression to ESRD requiring hemodialysis. Right internal jugular tunneled hemodialysis catheter placed on 12/24 and patient set up with outpatient hemodialysis.   Thrombocytopenia Likely secondary to acute blood loss. Stable. Patient will need repeat CBC as an outpatient.   Secondary AV block type 1 Noted. Coreg stopped.   BPH Continue Flomax .   Moderate malnutrition Patient provided with nutritional supplements while inpatient.  Aortic atherosclerosis Noted on CT imaging. Continue Crestor .   Class II obesity Estimated body mass index is 34.52 kg/m as calculated from the following:   Height as of this encounter: 5' 6 (1.676 m).   Weight as of this encounter: 97 kg.    Consultants: Urology Procedures performed:  HD catheter  placement/removal Tunneled HD catheter placement Left thoracentesis Hemodialysis  Disposition: Home Diet recommendation: Renal diet   DISCHARGE MEDICATION: Allergies as of 08/17/2023       Reactions   Ace Inhibitors Cough        Medication List     STOP taking these medications    amLODipine  10 MG tablet Commonly known as: NORVASC    aspirin  EC 81 MG tablet   carvedilol 6.25 MG tablet Commonly known as: COREG   chlorthalidone 50 MG tablet Commonly known as: HYGROTON   furosemide  20 MG tablet Commonly known as: LASIX    hydrALAZINE  100 MG tablet Commonly known as: APRESOLINE    Jardiance 25 MG Tabs tablet Generic drug: empagliflozin   losartan 100 MG tablet Commonly known as: COZAAR       TAKE these medications    acetaminophen  500 MG tablet Commonly known as: TYLENOL  Take 1,000 mg by mouth daily.   allopurinol  100 MG tablet Commonly known as: ZYLOPRIM  Take 1 tablet by mouth after dialysis on Monday, Tuesday, Thursday, and Friday.  Titrate as recommended by nephrology. What changed:  medication strength how much to take how to take this when to take this additional instructions   calcium  acetate 667 MG capsule Commonly known as: PHOSLO  Take 1 capsule (667 mg total) by mouth 3 (three) times daily with meals.   docusate sodium 50 MG capsule Commonly known as: COLACE Take 400 mg by mouth daily.   finasteride  5 MG tablet Commonly known as: PROSCAR  TAKE 1 TABLET BY MOUTH EVERY DAY   rosuvastatin  10 MG tablet Commonly known as: CRESTOR  Take 1 tablet (10 mg total) by mouth daily. What changed:  medication strength how much to take   S.S.S. TONIC PO Take 20 mLs by mouth daily.   Similasan Dry Eye Relief Soln Apply 2 drops to eye as needed (Dry eyes).   sodium chloride  0.65 % Soln nasal spray Commonly known as: OCEAN Place 1 spray into both nostrils 3 (three) times a week.   tamsulosin  0.4 MG Caps capsule Commonly known as: FLOMAX  TAKE  1 CAPSLE BY MOUTH DAILY               Durable Medical Equipment  (From admission, onward)           Start     Ordered   07/29/23 1608  For home use only DME Bedside commode  Once       Question:  Patient needs a bedside commode to treat with the following condition  Answer:  Weakness   07/29/23 1607   07/29/23 1606  For home use only DME Hospital bed  Once       Comments: Therapeutic mattress  Question Answer Comment  Length of Need Lifetime   Patient has (list medical condition): CHF   The above medical condition requires: Patient requires the ability to reposition frequently   Head must be elevated greater than: 30 degrees   Bed type Semi-electric      07/29/23 1607            Follow-up Information     [facility] Follow up.   Why: hospital bed, Bedside commode Contact information: [phone]        [facility]. Go on 08/18/2023.   Why: Transitional Care Unit- Monday, Tuesday, Thursday, Friday with 12:15 pm chair time.  For first appointment, please arrive at 11:30 am to complete paperwork prior to treatment. Contact information: [address] [place] [address] [phone]  [facility] Follow up.   Specialty: Home Health Services Why: The home health agency will contact you for the first home visit. Contact information: [address] [place] [address] [phone]         Jarold Medici, MD. Schedule an appointment as soon as possible for a visit in 1 week(s).   Specialty: Internal Medicine Why: For hospital follow-up Contact information: [address] [place] [address] [phone]         Carolee Sherwood [name], MD. Schedule an appointment as soon as possible for a visit in 1 week(s).   Specialty: Urology Why: For hospital follow-up. Hematuria (blood in urine). Contact information: [address] [place] [address] [phone]                Discharge  Exam: BP 111/69 (BP Location: Left Arm)   Pulse 73   Temp 98.4 F (36.9 C) (Oral)   Resp 20   Ht 5' 6 (1.676 m)   Wt 97 kg   SpO2 98%   BMI 34.52 kg/m   General exam: Appears calm and comfortable Respiratory system: Clear to auscultation. Respiratory effort normal. Cardiovascular system: S1 & S2 heard, RRR. No murmurs, rubs, gallops or clicks. Gastrointestinal system: Abdomen is nondistended, soft and nontender. Normal bowel sounds heard. Central nervous system: Alert and oriented. No focal neurological deficits. Psychiatry: Judgement and insight appear normal. Mood & affect appropriate.   Condition at discharge: stable  The results of significant diagnostics from this hospitalization (including imaging, microbiology, ancillary and laboratory) are listed below for reference.   Imaging Studies: CT CYSTOGRAM PELVIS Result Date: 08/15/2023 CLINICAL DATA:  Assessing clot burden to advise possible surgery tomorrow EXAM: CT CYSTOGRAM (CT PELVIS WITH CONTRAST) TECHNIQUE: Multidetector CT imaging through the pelvis was performed after dilute contrast had been introduced into the bladder for the purposes of performing CT cystography. RADIATION DOSE REDUCTION: This exam was performed according to the departmental dose-optimization program which includes automated exposure control, adjustment of the mA and/or kV according to patient size and/or use of iterative reconstruction technique. CONTRAST:  50mL OMNIPAQUE  IOHEXOL  350 MG/ML SOLN COMPARISON:  08/03/2023 FINDINGS: Urinary Tract: There is an indwelling Foley catheter within the bladder lumen, with dense contrast instilled for CT cystography. Given the density of the contrast and associated streak artifact, evaluation of the bladder lumen is limited. A single 8 mm filling defect is seen at the interface between the dense dependent contrast and the unopacified bladder contents anteriorly. No evidence of bladder wall thickening. The distal ureters  are unremarkable. Bowel: No bowel obstruction or ileus. Diffuse colonic diverticulosis without diverticulitis. Normal appendix right lower quadrant. Moderate retained stool throughout the colon compatible with constipation. Vascular/Lymphatic: Stable atherosclerosis.  Pathologic adenopathy. Reproductive:  Stable mildly enlarged prostate. Other: No free fluid or free intraperitoneal gas. No abdominal wall hernia. Musculoskeletal: No acute or destructive bony abnormalities. Stable degenerative changes of the lower lumbar spine and bilateral hips. Reconstructed images demonstrate no additional findings. IMPRESSION: 1. Suboptimal CT cystogram due to the attenuation of the instilled contrast and extensive streak artifact. There is a single 8 mm filling defect at the interface between the instilled contrast and the un opacified bladder contents. This may reflect residual blood clot. 2. Colonic diverticulosis without diverticulitis. 3. Moderate retained stool consistent with constipation. 4. Stable enlarged prostate. 5.  Aortic Atherosclerosis (ICD10-I70.0). Electronically Signed   By: Ozell Daring M.D.   On: 08/15/2023 16:00   DG  Chest 2 View Result Date: 08/12/2023 CLINICAL DATA:  Pleural effusion. EXAM: CHEST - 2 VIEW COMPARISON:  Chest radiograph dated 08/01/2023. FINDINGS: Right IJ central venous line with tip over central SVC. Similar appearance of small left pleural effusion and left lung base atelectasis or infiltrate. The right lung is clear. No pneumothorax. Stable mild cardiomegaly. No acute osseous pathology. Degenerative changes of the spine. IMPRESSION: Similar appearance of small left pleural effusion and left lung base atelectasis or infiltrate. Electronically Signed   By: Vanetta Chou M.D.   On: 08/12/2023 17:48   IR Fluoro Guide CV Line Right Result Date: 08/09/2023 INDICATION: ESRD requiring HD. Prior VIR-placed non tunneled dialysis catheter placed 08/04/2023. EXAM: TUNNELED CENTRAL VENOUS  HEMODIALYSIS CATHETER PLACEMENT WITH ULTRASOUND AND FLUOROSCOPIC GUIDANCE MEDICATIONS: Ancef  2 gm IV . The antibiotic was given in an appropriate time interval prior to skin puncture. ANESTHESIA/SEDATION: Moderate (conscious) sedation was employed during this procedure. A total of Versed  1 mg and Fentanyl  50 mcg was administered intravenously. Moderate Sedation Time: 10 minutes. The patient's level of consciousness and vital signs were monitored continuously by radiology nursing throughout the procedure under my direct supervision. FLUOROSCOPY TIME:  Fluoroscopic dose; 5 mGy COMPLICATIONS: None immediate. PROCEDURE: Informed written consent was obtained from the the patient and/or patient's representative after a discussion of the risks, benefits, and alternatives to treatment. Questions regarding the procedure were encouraged and answered. The RIGHT neck and chest were prepped with chlorhexidine  in a sterile fashion, and a sterile drape was applied covering the operative field. Maximum barrier sterile technique with sterile gowns and gloves were used for the procedure. A timeout was performed prior to the initiation of the procedure. A stiff Glidewire was advanced into the pre-existing non tunneled catheter to the level of the IVC and the temporary catheter was removed. A palindrome tunneled hemodialysis catheter measuring 19 cm from tip to cuff was tunneled in a retrograde fashion from the anterior chest wall to the venotomy incision. The catheter was then placed through the peel-away sheath with tips ultimately positioned within the superior aspect of the right atrium. Final catheter positioning was confirmed and documented with a spot radiographic image. The catheter aspirates and flushes normally. The catheter was flushed with appropriate volume heparin  dwells. The catheter exit site was secured with a 2-0 Ethilon retention suture. The venotomy incision was closed with Dermabond. Dressings were applied. The  patient tolerated the procedure well without immediate post procedural complication. IMPRESSION: Successful placement of 19 cm tip to cuff tunneled hemodialysis catheter via the RIGHT internal jugular vein The tip of the catheter is positioned at the superior cavo-atrial junction. The catheter is ready for immediate use. Thom Hall, MD [facility] Radiology Electronically Signed   By: Thom Hall M.D.   On: 08/09/2023 16:49   IR Fluoro Guide CV Line Right Result Date: 08/04/2023 INDICATION: 73 year old male with worsening end-stage renal disease requiring initiation of hemodialysis. EXAM: NON-TUNNELED CENTRAL VENOUS HEMODIALYSIS CATHETER PLACEMENT WITH ULTRASOUND AND FLUOROSCOPIC GUIDANCE COMPARISON:  None Available. MEDICATIONS: None FLUOROSCOPY TIME:  Two mGy COMPLICATIONS: None immediate. PROCEDURE: Informed written consent was obtained from the patient after a discussion of the risks, benefits, and alternatives to treatment. Questions regarding the procedure were encouraged and answered. The right neck and chest were prepped with chlorhexidine  in a sterile fashion, and a sterile drape was applied covering the operative field. Maximum barrier sterile technique with sterile gowns and gloves were used for the procedure. A timeout was performed prior  to the initiation of the procedure. After the overlying soft tissues were anesthetized, a small venotomy incision was created and a micropuncture kit was utilized to access the internal jugular vein. Real-time ultrasound guidance was utilized for vascular access including the acquisition of a permanent ultrasound image documenting patency of the accessed vessel. The microwire was utilized to measure appropriate catheter length. A Rosen wire was advanced to the level of the IVC. Under fluoroscopic guidance, the venotomy was serially dilated, ultimately allowing placement of a 20 cm temporary Trialysis catheter with  tip ultimately terminating within the superior aspect of the right atrium. Final catheter positioning was confirmed and documented with a spot radiographic image. The catheter aspirates and flushes normally. The catheter was flushed with appropriate volume heparin  dwells. The catheter exit site was secured with a 0 silk retention suture. A dressing was placed. The patient tolerated the procedure well without immediate post procedural complication. IMPRESSION: Successful placement of a right internal jugular approach 20 cm temporary dialysis catheter with tip terminating with in the superior aspect of the right atrium. The catheter is ready for immediate use. PLAN: This catheter may be converted to a tunneled dialysis catheter at a later date as indicated. Ester Sides, MD [facility] Radiology Electronically Signed   By: Ester Sides M.D.   On: 08/04/2023 21:06   IR US  Guide Vasc Access Right Result Date: 08/04/2023 INDICATION: 73 year old male with worsening end-stage renal disease requiring initiation of hemodialysis. EXAM: NON-TUNNELED CENTRAL VENOUS HEMODIALYSIS CATHETER PLACEMENT WITH ULTRASOUND AND FLUOROSCOPIC GUIDANCE COMPARISON:  None Available. MEDICATIONS: None FLUOROSCOPY TIME:  Two mGy COMPLICATIONS: None immediate. PROCEDURE: Informed written consent was obtained from the patient after a discussion of the risks, benefits, and alternatives to treatment. Questions regarding the procedure were encouraged and answered. The right neck and chest were prepped with chlorhexidine  in a sterile fashion, and a sterile drape was applied covering the operative field. Maximum barrier sterile technique with sterile gowns and gloves were used for the procedure. A timeout was performed prior to the initiation of the procedure. After the overlying soft tissues were anesthetized, a small venotomy incision was created and a micropuncture kit was utilized to access the  internal jugular vein. Real-time ultrasound guidance was utilized for vascular access including the acquisition of a permanent ultrasound image documenting patency of the accessed vessel. The microwire was utilized to measure appropriate catheter length. A Rosen wire was advanced to the level of the IVC. Under fluoroscopic guidance, the venotomy was serially dilated, ultimately allowing placement of a 20 cm temporary Trialysis catheter with tip ultimately terminating within the superior aspect of the right atrium. Final catheter positioning was confirmed and documented with a spot radiographic image. The catheter aspirates and flushes normally. The catheter was flushed with appropriate volume heparin  dwells. The catheter exit site was secured with a 0 silk retention suture. A dressing was placed. The patient tolerated the procedure well without immediate post procedural complication. IMPRESSION: Successful placement of a right internal jugular approach 20 cm temporary dialysis catheter with tip terminating with in the superior aspect of the right atrium. The catheter is ready for immediate use. PLAN: This catheter may be converted to a tunneled dialysis catheter at a later date as indicated. Ester Sides, MD [facility] Radiology Electronically Signed   By: Ester Sides M.D.   On: 08/04/2023 21:06   CT ABDOMEN PELVIS WO CONTRAST Result Date: 08/03/2023 CLINICAL DATA:  Hematuria. EXAM: CT ABDOMEN  AND PELVIS WITHOUT CONTRAST TECHNIQUE: Multidetector CT imaging of the abdomen and pelvis was performed following the standard protocol without IV contrast. RADIATION DOSE REDUCTION: This exam was performed according to the departmental dose-optimization program which includes automated exposure control, adjustment of the mA and/or kV according to patient size and/or use of iterative reconstruction technique. COMPARISON:  CT of the chest abdomen pelvis dated 07/21/2023.  FINDINGS: Evaluation of this exam is limited in the absence of intravenous contrast. Lower chest: Small bilateral pleural effusions and bibasilar atelectasis. No intra-abdominal free air or free fluid. Hepatobiliary: The liver is unremarkable. No biliary dilatation. The gallbladder is unremarkable Pancreas: The pancreas is unremarkable as visualized. Spleen: Normal in size without focal abnormality. Adrenals/Urinary Tract: The adrenal glands are unremarkable. There is no hydronephrosis or nephrolithiasis on either side. Bilateral renal cysts and additional subcentimeter hypodense lesions which are not characterized on this CT. There is a 3 cm high attenuating lesion in the interpolar right kidney which is not characterized on this CT and may represent a proteinaceous or hemorrhagic cyst. A solid mass is not excluded further evaluation with ultrasound or MRI on a nonemergent/outpatient basis recommended. The visualized ureters appear unremarkable. The urinary bladder is decompressed around a Foley catheter. There is moderate amount of high attenuating content within the bladder consistent with blood products/clot. Stomach/Bowel: There is moderate sigmoid diverticulosis without active inflammatory changes. There is moderate stool throughout the colon. No bowel obstruction or active inflammation. The appendix is normal. Vascular/Lymphatic: Mild aortoiliac atherosclerotic disease. The IVC is unremarkable. No portal venous gas. There is no adenopathy. Reproductive: Mildly enlarged prostate gland measuring 5.3 cm in transverse axial diameter. The seminal vesicles are symmetric. Other: Bilateral anterior abdominal wall subcutaneous densities consistent with injections. Musculoskeletal: Degenerative changes of the spine and left hip. No acute osseous pathology. IMPRESSION: 1. No hydronephrosis or nephrolithiasis. 2. Moderate amount of blood products/clot within the urinary bladder. 3. A 3 cm indeterminate high attenuating  lesion in the interpolar right kidney. Further evaluation with ultrasound or MRI on a nonemergent/outpatient basis recommended. 4. Moderate sigmoid diverticulosis. No bowel obstruction. Normal appendix. 5. Small bilateral pleural effusions and bibasilar atelectasis. 6.  Aortic Atherosclerosis (ICD10-I70.0). Electronically Signed   By: Vanetta Chou M.D.   On: 08/03/2023 17:25   DG CHEST PORT 1 VIEW Result Date: 08/01/2023 CLINICAL DATA:  Pleural effusion. EXAM: PORTABLE CHEST 1 VIEW COMPARISON:  07/28/2023 FINDINGS: Right lung clear. Left base collapse/consolidation with small left pleural effusion, slightly decreased in the interval. The cardio pericardial silhouette is enlarged. No acute bony abnormality. Telemetry leads overlie the chest. IMPRESSION: Left base collapse/consolidation with small left pleural effusion, slightly decreased in the interval. Electronically Signed   By: Camellia Candle M.D.   On: 08/01/2023 18:40   DG CHEST PORT 1 VIEW Result Date: 07/28/2023 CLINICAL DATA:  142230 Pleural effusion [id number] EXAM: PORTABLE CHEST 1 VIEW COMPARISON:  July 26, 2023 FINDINGS: The cardiomediastinal silhouette is unchanged in contour. Moderate LEFT pleural effusion, similar comparison to prior. Trace RIGHT pleural effusion. No pneumothorax. Similar homogeneous opacification of the LEFT lung base. IMPRESSION: Similar moderate LEFT pleural effusion with associated LEFT basilar opacification. Electronically Signed   By: Corean Salter M.D.   On: 07/28/2023 10:02   DG CHEST PORT 1 VIEW Result Date: 07/26/2023 CLINICAL DATA:  Post thoracentesis EXAM: PORTABLE CHEST 1 VIEW COMPARISON:  07/25/2023 FINDINGS: Decreasing left pleural effusion following thoracentesis. No pneumothorax. Small bilateral pleural effusions with bibasilar opacities, likely atelectasis. Cardiomegaly. No overt edema. IMPRESSION: Decreasing  left effusion following thoracentesis.  No pneumothorax. Small bilateral effusions with  bibasilar atelectasis. Cardiomegaly. Electronically Signed   By: Franky Crease M.D.   On: 07/26/2023 20:05   DG CHEST PORT 1 VIEW Result Date: 07/25/2023 CLINICAL DATA:  Respiratory failure.  Pleural effusion EXAM: PORTABLE CHEST 1 VIEW, portable upright COMPARISON:  X-ray 07/22/2023 and older.  CT 07/21/2023. FINDINGS: Persistent large left effusion, similar to previous. Obscuration of the left cardiac border. No pneumothorax. Tiny right effusion. No edema. Decreasing vascular congestion. Enlarged cardiopericardial silhouette. Film is rotated to the left. Overlapping cardiac leads. Degenerative changes along the spine. IMPRESSION: Decreasing vascular congestion. Right effusion appears slightly smaller today. This could be technical. Electronically Signed   By: Ranell Bring M.D.   On: 07/25/2023 12:00   ECHOCARDIOGRAM COMPLETE Result Date: 07/23/2023    ECHOCARDIOGRAM REPORT   Patient Name:   KENSHIN SPLAWN [facility] Date of Exam: 07/23/2023 Medical Rec #:  [id number]         Height:       66.0 in Accession #:    [id number]        Weight:       243.6 lb Date of Birth:  Apr 25, 1951          BSA:          2.175 m Patient Age:    72 years          BP:           129/72 mmHg Patient Gender: M                 HR:           76 bpm. Exam Location:  Inpatient Procedure: 2D Echo, Color Doppler and Cardiac Doppler Indications:    Dyspnea  History:        Patient has prior history of Echocardiogram examinations, most                 recent 01/26/2023. CHF, CAD, CKD; Risk Factors:Hypertension and                 Dyslipidemia.  Sonographer:    Melissa Kafa Referring Phys: [id number] PREETHA JOSEPH IMPRESSIONS  1. Left ventricular ejection fraction, by estimation, is 65 to 70%. The left ventricle has normal function. The left ventricle has no regional wall motion abnormalities. There is moderate left ventricular hypertrophy. Left ventricular diastolic parameters are indeterminate.  2. Right ventricular systolic function is normal. The right  ventricular size is normal.  3. Left atrial size was moderately dilated.  4. The mitral valve is abnormal. Trivial mitral valve regurgitation. No evidence of mitral stenosis.  5. The aortic valve is tricuspid. There is mild calcification of the aortic valve. Aortic valve regurgitation is not visualized. Aortic valve sclerosis is present, with no evidence of aortic valve stenosis.  6. The inferior vena cava is normal in size with greater than 50% respiratory variability, suggesting right atrial pressure of 3 mmHg. FINDINGS  Left Ventricle: Left ventricular ejection fraction, by estimation, is 65 to 70%. The left ventricle has normal function. The left ventricle has no regional wall motion abnormalities. The left ventricular internal cavity size was small. There is moderate  left ventricular hypertrophy. Left ventricular diastolic parameters are indeterminate. Right Ventricle: The right ventricular size is normal. No increase in right ventricular wall thickness. Right ventricular systolic function is normal. Left Atrium: Left atrial size was moderately dilated. Right Atrium: Right atrial size was normal in size. Pericardium: There  is no evidence of pericardial effusion. Mitral Valve: The mitral valve is abnormal. There is mild thickening of the mitral valve leaflet(s). Trivial mitral valve regurgitation. No evidence of mitral valve stenosis. Tricuspid Valve: The tricuspid valve is normal in structure. Tricuspid valve regurgitation is not demonstrated. No evidence of tricuspid stenosis. Aortic Valve: The aortic valve is tricuspid. There is mild calcification of the aortic valve. Aortic valve regurgitation is not visualized. Aortic valve sclerosis is present, with no evidence of aortic valve stenosis. Aortic valve mean gradient measures 6.0 mmHg. Aortic valve peak gradient measures 12.2 mmHg. Aortic valve area, by VTI measures 2.83 cm. Pulmonic Valve: The pulmonic valve was normal in structure. Pulmonic valve  regurgitation is mild. No evidence of pulmonic stenosis. Aorta: The aortic root is normal in size and structure. Venous: The inferior vena cava is normal in size with greater than 50% respiratory variability, suggesting right atrial pressure of 3 mmHg. IAS/Shunts: The interatrial septum was not well visualized.  LEFT VENTRICLE PLAX 2D LVIDd:         4.50 cm   Diastology LVIDs:         2.80 cm   LV e' medial:    8.81 cm/s LV PW:         1.40 cm   LV E/e' medial:  14.6 LV IVS:        1.40 cm   LV e' lateral:   7.62 cm/s LVOT diam:     2.10 cm   LV E/e' lateral: 16.9 LV SV:         74 LV SV Index:   34 LVOT Area:     3.46 cm  RIGHT VENTRICLE RV S prime:     13.90 cm/s TAPSE (M-mode): 1.7 cm LEFT ATRIUM             Index        RIGHT ATRIUM           Index LA diam:        4.30 cm 1.98 cm/m   RA Area:     31.00 cm LA Vol (A2C):   76.5 ml 35.18 ml/m  RA Volume:   125.00 ml 57.48 ml/m LA Vol (A4C):   70.2 ml 32.28 ml/m LA Biplane Vol: 73.1 ml 33.61 ml/m  AORTIC VALVE AV Area (Vmax):    2.89 cm AV Area (Vmean):   2.68 cm AV Area (VTI):     2.83 cm AV Vmax:           175.00 cm/s AV Vmean:          115.000 cm/s AV VTI:            0.262 m AV Peak Grad:      12.2 mmHg AV Mean Grad:      6.0 mmHg LVOT Vmax:         146.00 cm/s LVOT Vmean:        89.100 cm/s LVOT VTI:          0.214 m LVOT/AV VTI ratio: 0.82  AORTA Ao Root diam: 3.60 cm Ao Asc diam:  3.70 cm MITRAL VALVE MV Area (PHT): 3.34 cm     SHUNTS MV Decel Time: 227 msec     Systemic VTI:  0.21 m MV E velocity: 129.00 cm/s  Systemic Diam: 2.10 cm MV A velocity: 107.00 cm/s MV E/A ratio:  1.21 Maude Emmer MD Electronically signed by Maude Emmer MD Signature Date/Time: 07/23/2023/4:35:16 PM    Final  DG CHEST PORT 1 VIEW Result Date: 07/22/2023 CLINICAL DATA:  Shortness of breath and fatigue EXAM: PORTABLE CHEST 1 VIEW COMPARISON:  07/21/2023 FINDINGS: Cardiac shadow remains enlarged. Central vascular congestion is again identified. Bilateral effusions are  noted left considerably greater than right. The overall appearance is similar to that seen on the prior exam. IMPRESSION: Persistent effusions and vascular congestion consistent with CHF. Electronically Signed   By: Oneil Devonshire M.D.   On: 07/22/2023 21:27   CT CHEST ABDOMEN PELVIS WO CONTRAST Result Date: 07/21/2023 CLINICAL DATA:  Follow-up aneurysm. EXAM: CT CHEST, ABDOMEN AND PELVIS WITHOUT CONTRAST TECHNIQUE: Multidetector CT imaging of the chest, abdomen and pelvis was performed following the standard protocol without IV contrast. RADIATION DOSE REDUCTION: This exam was performed according to the departmental dose-optimization program which includes automated exposure control, adjustment of the mA and/or kV according to patient size and/or use of iterative reconstruction technique. COMPARISON:  Cardiac CT 01/12/2022 FINDINGS: CT CHEST FINDINGS Cardiovascular: Heart is moderately enlarged. Aorta is normal in size. There is no pericardial effusion. There is mild calcified atherosclerotic disease throughout the aorta. Mediastinum/Nodes: No enlarged mediastinal, hilar, or axillary lymph nodes. Thyroid gland, trachea, and esophagus demonstrate no significant findings. Lungs/Pleura: Small right and moderate left pleural effusions are present. There is compressive atelectasis of the entire left lower lobe. There is compressive atelectasis of the portion of the left upper lobe. There is also atelectasis in the right lower lobe. No evidence for pneumothorax. Musculoskeletal: There is bilateral gynecomastia, right greater than left. No acute fractures are seen. CT ABDOMEN PELVIS FINDINGS Hepatobiliary: No focal liver abnormality is seen. No gallstones, gallbladder wall thickening, or biliary dilatation. Pancreas: Unremarkable. No pancreatic ductal dilatation or surrounding inflammatory changes. Spleen: Normal in size without focal abnormality. Adrenals/Urinary Tract: Bilateral renal cysts are present measuring up to  2.2 cm. There is no hydronephrosis. There is mild bilateral perinephric fat stranding. No urinary tract calculi are seen. Adrenal glands and bladder are within normal limits. Stomach/Bowel: Stomach is within normal limits. Appendix appears normal. No evidence of bowel wall thickening, distention, or inflammatory changes. There is sigmoid colon diverticulosis. Vascular/Lymphatic: No significant vascular findings are present. No enlarged abdominal or pelvic lymph nodes. Reproductive: Prostate gland is enlarged. Other: There is mild diffuse body wall edema.  There is no ascites. Musculoskeletal: No fracture is seen. IMPRESSION: 1. No aneurysm identified. 2. Moderate left and small right pleural effusions with compressive atelectasis of the left lower lobe and portion of the left upper lobe. 3. Cardiomegaly. 4. Mild bilateral perinephric fat stranding. Correlate clinically for pyelonephritis. 5. Mild diffuse body wall edema. 6. Bilateral renal cysts. No follow-up imaging recommended. 7. Sigmoid colon diverticulosis. 8. Prostatomegaly. 9. Aortic atherosclerosis. Aortic Atherosclerosis (ICD10-I70.0). Electronically Signed   By: Greig Pique M.D.   On: 07/21/2023 19:51   DG Chest Portable 1 View Result Date: 07/21/2023 CLINICAL DATA:  SOB EXAM: PORTABLE CHEST 1 VIEW COMPARISON:  01/08/2004. FINDINGS: Cardiac silhouette appears prominent and partially obscured by large left-sided pleural effusion. Small right-sided pleural effusion. Pulmonary vascular congestion consistent with pulmonary edema. No pneumothorax. There are thoracic degenerative changes. IMPRESSION: Findings consistent with CHF. Electronically Signed   By: Fonda Field M.D.   On: 07/21/2023 15:51    Microbiology: Results for orders placed or performed during the hospital encounter of 07/21/23  Culture, blood (Routine X 2) w Reflex to ID Panel     Status: None   Collection Time: 07/23/23  9:12 PM   Specimen: BLOOD RIGHT  HAND  Result Value Ref  Range Status   Specimen Description BLOOD RIGHT HAND  Final   Special Requests   Final    BOTTLES DRAWN AEROBIC AND ANAEROBIC Blood Culture results may not be optimal due to an excessive volume of blood received in culture bottles   Culture   Final    NO GROWTH 5 DAYS Performed at [facility], [address] [place], [place] [address]    Report Status 07/28/2023 FINAL  Final  Culture, blood (Routine X 2) w Reflex to ID Panel     Status: None   Collection Time: 07/23/23  9:12 PM   Specimen: BLOOD  Result Value Ref Range Status   Specimen Description BLOOD LEFT ANTECUBITAL  Final   Special Requests   Final    BOTTLES DRAWN AEROBIC AND ANAEROBIC Blood Culture adequate volume   Culture   Final    NO GROWTH 5 DAYS Performed at [facility], [address], [place], [place] [address]    Report Status 07/28/2023 FINAL  Final  Urine Culture (for pregnant, neutropenic or urologic patients or patients with an indwelling urinary catheter)     Status: None   Collection Time: 07/23/23  9:50 PM   Specimen: Urine, Catheterized  Result Value Ref Range Status   Specimen Description URINE, CATHETERIZED  Final   Special Requests NONE  Final   Culture   Final    NO GROWTH Performed at [facility], [address] [place], [place] [address]    Report Status 07/25/2023 FINAL  Final  Pleural fluid culture w Gram Stain     Status: None   Collection Time: 07/26/23  1:38 PM   Specimen: Pleural Fluid  Result Value Ref Range Status   Specimen Description PLEURAL  Final   Special Requests NONE  Final   Gram Stain   Final    FEW WBC PRESENT, PREDOMINANTLY MONONUCLEAR NO ORGANISMS SEEN    Culture   Final    NO GROWTH 3 DAYS Performed at [facility], [address], [place], [place] [address]    Report Status 07/29/2023 FINAL  Final    Labs: CBC: Recent Labs  Lab 08/12/23 0246 08/13/23 0245 08/13/23 1004 08/13/23 2056 08/14/23 0148 08/14/23 1612 08/15/23 0224  08/16/23 0322 08/17/23 0837  WBC 5.7   < > 5.2  --  6.0  --  8.0 6.8 5.8  NEUTROABS 3.7  --   --   --  3.6  --  4.8 4.1 3.4  HGB 8.0*   < > 8.3*   < > 7.1* 8.1* 6.9* 8.3* 8.3*  HCT 25.5*   < > 26.2*   < > 22.3* 25.8* 21.7* 25.4* 25.5*  MCV 95.1   < > 94.6  --  95.7  --  96.4 92.7 93.4  PLT 105*   < > 103*  --  91*  --  82* 82* 82*   < > = values in this interval not displayed.   Basic Metabolic Panel: Recent Labs  Lab 08/12/23 0246 08/13/23 0245 08/14/23 0148 08/15/23 0224 08/16/23 0322 08/17/23 0837  NA 131* 129* 129* 130* 127* 130*  K 4.0 4.1 3.8 4.1 3.9 3.6  CL 94* 91* 93* 94* 92* 94*  CO2 25 24 27 24 22 26   GLUCOSE 106* 108* 120* 108* 103* 102*  BUN 59* 81* 47* 79* 95* 61*  CREATININE 4.18* 6.12* 4.35* 6.43* 6.08* 4.26*  CALCIUM  8.9 9.2 8.5*  8.7* 8.7* 8.7*  MG 2.3  --  2.2 2.5* 2.7* 2.3  PHOS 4.4 5.9* 3.8 6.1* 6.0* 5.0*   Liver Function Tests: Recent Labs  Lab 08/12/23 0246 08/13/23 0245 08/14/23 0148 08/15/23 0224 08/16/23 0322 08/17/23 0837  AST 26  --  38 31 27 31   ALT 16  --  21 19 18 19   ALKPHOS 53  --  45 49 47 49  BILITOT 0.6  --  0.2 2.0* 0.7 0.5  PROT 7.8  --  7.2 6.8 6.9 7.3  ALBUMIN  3.0* 3.0* 2.7* 2.8* 2.8* 2.9*     Discharge time spent: 35 minutes.  Signed: Elgin Lam, MD [facility] 08/17/2023

## 2023-08-17 NOTE — Progress Notes (Signed)
 Occupational Therapy Treatment Patient Details Name: Andrew Schmitt MRN: [id number] DOB: 14-Jun-1951 Today's Date: 08/17/2023   History of present illness Patient is a 73 year old with acute on chronic diastolic CHF, Acute hypoxemic hyercapnic respiratory failure, acute cardiogenic pulmonary edema, left pleural effusion, metabolic encephalopathy. S/p left thoracentesis. CBI initiated on 12/18 for gross hematuria. S/p temp HD Rt IJ catheter placed 12/19.   OT comments  Focus session on provision of CHF education and HEP per established OT POC. Reviewed CHF handout, then Pt needing increased time for new learning and answering scenario based questions regarding correct course of action (do nothing, visit/call PCP, or come to ED) with handout as reference. Provided UB HEP with cues for optimal technique and use of yellow theraband. Pt with fair carryover from prior OT session. Provided written handout created via medbridge. Pt needing 1-2 rest breaks during all exercises performed 10 reps each. Continue to recommend HHOT.       If plan is discharge home, recommend the following:  A little help with walking and/or transfers;A lot of help with bathing/dressing/bathroom;Assistance with cooking/housework;Assist for transportation;Help with stairs or ramp for entrance   Equipment Recommendations  BSC/3in1;Tub/shower seat    Recommendations for Other Services      Precautions / Restrictions Precautions Precautions: Fall Restrictions Weight Bearing Restrictions Per Provider Order: No       Mobility Bed Mobility               General bed mobility comments: in chair on arrival and departure    Transfers Overall transfer level: Needs assistance Equipment used: Rolling walker (2 wheels) Transfers: Sit to/from Stand Sit to Stand: Supervision           General transfer comment: for safety     Balance Overall balance assessment: Needs assistance Sitting-balance support: Feet  supported Sitting balance-Leahy Scale: Fair                                     ADL either performed or assessed with clinical judgement   ADL Overall ADL's : Needs assistance/impaired                                       General ADL Comments: focus session on UB HEP and CHF education see other exercises section below    Extremity/Trunk Assessment Upper Extremity Assessment Upper Extremity Assessment: Right hand dominant;Generalized weakness LUE Deficits / Details: weaker than RUE with resistive shoulder flexion/abduction   Lower Extremity Assessment Lower Extremity Assessment: Defer to PT evaluation        Vision       Perception     Praxis      Cognition Arousal: Alert Behavior During Therapy: WFL for tasks assessed/performed Overall Cognitive Status: Impaired/Different from baseline Area of Impairment: Attention, Problem solving, Awareness                   Current Attention Level: Selective     Safety/Judgement: Decreased awareness of deficits   Problem Solving: Slow processing, Difficulty sequencing (during exercise) General Comments: pleasant throughout. Decresaed awareness of health management. pt needing increased time and cues for new learning and body mechanics during HEP implementation.        Exercises Exercises: General Upper Extremity, Other exercises General Exercises - Upper Extremity Shoulder Flexion: Strengthening, Both,  10 reps, Seated, Theraband Theraband Level (Shoulder Flexion): Level 1 (Yellow) (increased time and rest breaks for LUE) Shoulder Horizontal ABduction: Strengthening, Both, 10 reps, Seated, Theraband Theraband Level (Shoulder Horizontal Abduction): Level 1 (Yellow) Chair Push Up: AROM, Strengthening, 10 reps (one rest break after 7th rep) Other Exercises Other Exercises: Provided medbridge CHF handout with categorization of symptoms into green, yellow, and red zones. Reviewed with pt  and pt able to use paper to answer basic scenrio  based questions regarding when he should call doctor, do nothing, or come to ED. Could benefit from further review.    Shoulder Instructions       General Comments      Pertinent Vitals/ Pain       Pain Assessment Pain Assessment: No/denies pain  Home Living                                          Prior Functioning/Environment              Frequency  Min 1X/week        Progress Toward Goals  OT Goals(current goals can now be found in the care plan section)  Progress towards OT goals: Progressing toward goals  Acute Rehab OT Goals Patient Stated Goal: go home OT Goal Formulation: With patient Time For Goal Achievement: 08/29/23 Potential to Achieve Goals: Good ADL Goals Pt Will Perform Lower Body Bathing: with supervision;sitting/lateral leans;sit to/from stand;with adaptive equipment Pt Will Perform Lower Body Dressing: with supervision;with adaptive equipment;sit to/from stand Pt Will Transfer to Toilet: with supervision;ambulating;bedside commode Pt Will Perform Toileting - Clothing Manipulation and hygiene: with supervision;sitting/lateral leans;sit to/from stand Pt/caregiver will Perform Home Exercise Program: Increased strength;Both right and left upper extremity;With theraband;With written HEP provided;Independently Additional ADL Goal #1: Patient will demonstrate ability to Independently state 3 strategies for improved management of CHF with handout provided.  Plan      Co-evaluation                 AM-PAC OT 6 Clicks Daily Activity     Outcome Measure   Help from another person eating meals?: None Help from another person taking care of personal grooming?: A Little Help from another person toileting, which includes using toliet, bedpan, or urinal?: A Little Help from another person bathing (including washing, rinsing, drying)?: A Lot Help from another person to put on and  taking off regular upper body clothing?: None Help from another person to put on and taking off regular lower body clothing?: A Lot 6 Click Score: 18    End of Session    OT Visit Diagnosis: Unsteadiness on feet (R26.81);Muscle weakness (generalized) (M62.81);Other (comment)   Activity Tolerance Patient tolerated treatment well   Patient Left in chair;with call bell/phone within reach   Nurse Communication Mobility status        Time: 9147-9081 OT Time Calculation (min): 26 min  Charges: OT General Charges $OT Visit: 1 Visit OT Treatments $Self Care/Home Management : 8-22 mins $Therapeutic Exercise: 8-22 mins  Andrew Schmitt, OTR/L [facility] Acute Rehabilitation Office: [phone]   Andrew JONETTA Lebron 08/17/2023, 9:37 AM

## 2023-08-17 NOTE — Plan of Care (Signed)
  Problem: Education: Goal: Knowledge of General Education information will improve Description: Including pain rating scale, medication(s)/side effects and non-pharmacologic comfort measures Outcome: Progressing   Problem: Health Behavior/Discharge Planning: Goal: Ability to manage health-related needs will improve Outcome: Progressing   Problem: Clinical Measurements: Goal: Ability to maintain clinical measurements within normal limits will improve Outcome: Progressing Goal: Will remain free from infection Outcome: Progressing Goal: Diagnostic test results will improve Outcome: Progressing Goal: Respiratory complications will improve Outcome: Progressing Goal: Cardiovascular complication will be avoided Outcome: Progressing   Problem: Activity: Goal: Risk for activity intolerance will decrease Outcome: Progressing   Problem: Nutrition: Goal: Adequate nutrition will be maintained Outcome: Progressing   Problem: Coping: Goal: Level of anxiety will decrease Outcome: Progressing   Problem: Elimination: Goal: Will not experience complications related to bowel motility Outcome: Progressing Goal: Will not experience complications related to urinary retention Outcome: Progressing   Problem: Pain Management: Goal: General experience of comfort will improve Outcome: Progressing   Problem: Safety: Goal: Ability to remain free from injury will improve Outcome: Progressing   Problem: Skin Integrity: Goal: Risk for impaired skin integrity will decrease Outcome: Progressing   Problem: Education: Goal: Ability to demonstrate management of disease process will improve Outcome: Progressing Goal: Ability to verbalize understanding of medication therapies will improve Outcome: Progressing Goal: Individualized Educational Video(s) Outcome: Progressing   Problem: Activity: Goal: Capacity to carry out activities will improve Outcome: Progressing   Problem: Cardiac: Goal:  Ability to achieve and maintain adequate cardiopulmonary perfusion will improve Outcome: Progressing   Problem: Education: Goal: Knowledge of disease and its progression will improve Outcome: Progressing   Problem: Health Behavior/Discharge Planning: Goal: Ability to manage health-related needs will improve Outcome: Progressing   Problem: Clinical Measurements: Goal: Complications related to the disease process or treatment will be avoided or minimized Outcome: Progressing Goal: Dialysis access will remain free of complications Outcome: Progressing   Problem: Activity: Goal: Activity intolerance will improve Outcome: Progressing   Problem: Fluid Volume: Goal: Fluid volume balance will be maintained or improved Outcome: Progressing   Problem: Nutritional: Goal: Ability to make appropriate dietary choices will improve Outcome: Progressing   Problem: Respiratory: Goal: Respiratory symptoms related to disease process will be avoided Outcome: Progressing   Problem: Self-Concept: Goal: Body image disturbance will be avoided or minimized Outcome: Progressing   Problem: Urinary Elimination: Goal: Progression of disease will be identified and treated Outcome: Progressing   Problem: Education: Goal: Knowledge of disease and its progression will improve Outcome: Progressing   Problem: Health Behavior/Discharge Planning: Goal: Ability to manage health-related needs will improve Outcome: Progressing   Problem: Clinical Measurements: Goal: Complications related to the disease process or treatment will be avoided or minimized Outcome: Progressing Goal: Dialysis access will remain free of complications Outcome: Progressing   Problem: Activity: Goal: Activity intolerance will improve Outcome: Progressing   Problem: Fluid Volume: Goal: Fluid volume balance will be maintained or improved Outcome: Progressing   Problem: Nutritional: Goal: Ability to make appropriate  dietary choices will improve Outcome: Progressing   Problem: Respiratory: Goal: Respiratory symptoms related to disease process will be avoided Outcome: Progressing   Problem: Self-Concept: Goal: Body image disturbance will be avoided or minimized Outcome: Progressing   Problem: Urinary Elimination: Goal: Progression of disease will be identified and treated Outcome: Progressing

## 2023-08-17 NOTE — Progress Notes (Signed)
 Discharge instructions (including medications) discussed with and copy provided to patient/caregiver. Verbalized understanding.  PIV x 1 removed and patient assisted with dressing.  TOC medications delivered by main pharmacy assistant to patient.

## 2023-08-17 NOTE — Discharge Instructions (Addendum)
 Andrew Schmitt,  You were in the hospital with low blood counts related to bleeding from your urine system. This was managed by the urologist successfully. You also had worsening renal failure requiring initiation of hemodialysis; this will continue after you are discharged. Please follow-up with your PCP, the urologist and the nephrologist.

## 2023-08-17 NOTE — Progress Notes (Signed)
 Physical Therapy Treatment Patient Details Name: Andrew Schmitt MRN: [id number] DOB: 04-15-1951 Today's Date: 08/17/2023   History of Present Illness Patient is a 73 year old with acute on chronic diastolic CHF, Acute hypoxemic hyercapnic respiratory failure, acute cardiogenic pulmonary edema, left pleural effusion, metabolic encephalopathy. S/p left thoracentesis. CBI initiated on 12/18 for gross hematuria. S/p temp HD Rt IJ catheter placed 12/19.    PT Comments  Pt making good progress with all mobility. Practiced a couple of stairs in preparation to return home. Plans to stay on ground level of home initially. Able to manage step to get into home. Ready for dc home from PT standpoint when medically ready.    If plan is discharge home, recommend the following: A little help with bathing/dressing/bathroom;Assistance with cooking/housework;Help with stairs or ramp for entrance   Can travel by private vehicle        Equipment Recommendations  Hospital bed;BSC/3in1;Rolling walker (2 wheels)    Recommendations for Other Services       Precautions / Restrictions Precautions Precautions: None Restrictions Weight Bearing Restrictions Per Provider Order: No     Mobility  Bed Mobility               General bed mobility comments: Up in chair    Transfers Overall transfer level: Modified independent Equipment used: Rolling walker (2 wheels) Transfers: Sit to/from Stand Sit to Stand: Modified independent (Device/Increase time)                Ambulation/Gait Ambulation/Gait assistance: Supervision Gait Distance (Feet): 300 Feet Assistive device: Rolling walker (2 wheels) Gait Pattern/deviations: Step-through pattern, Decreased stride length Gait velocity: decr Gait velocity interpretation: 1.31 - 2.62 ft/sec, indicative of limited community ambulator   General Gait Details: Steady gait with walker   Stairs Stairs: Yes Stairs assistance: Supervision Stair  Management: Two rails, Forwards, Step to pattern Number of Stairs: 2     Wheelchair Mobility     Tilt Bed    Modified Rankin (Stroke Patients Only)       Balance Overall balance assessment: Needs assistance Sitting-balance support: Feet unsupported, No upper extremity supported Sitting balance-Leahy Scale: Good     Standing balance support: No upper extremity supported, During functional activity Standing balance-Leahy Scale: Fair                              Cognition Arousal: Alert Behavior During Therapy: WFL for tasks assessed/performed Overall Cognitive Status: Within Functional Limits for tasks assessed                                          Exercises      General Comments        Pertinent Vitals/Pain Pain Assessment Pain Assessment: No/denies pain    Home Living                          Prior Function            PT Goals (current goals can now be found in the care plan section) Acute Rehab PT Goals Patient Stated Goal: go home PT Goal Formulation: With patient Time For Goal Achievement: 08/24/23 Potential to Achieve Goals: Good Progress towards PT goals: Goals met and updated - see care plan    Frequency    Min  1X/week      PT Plan      Co-evaluation              AM-PAC PT 6 Clicks Mobility   Outcome Measure  Help needed turning from your back to your side while in a flat bed without using bedrails?: None Help needed moving from lying on your back to sitting on the side of a flat bed without using bedrails?: None Help needed moving to and from a bed to a chair (including a wheelchair)?: A Little Help needed standing up from a chair using your arms (e.g., wheelchair or bedside chair)?: None Help needed to walk in hospital room?: A Little Help needed climbing 3-5 steps with a railing? : A Little 6 Click Score: 21    End of Session   Activity Tolerance: Patient tolerated treatment  well Patient left: in chair;with call bell/phone within reach   PT Visit Diagnosis: Muscle weakness (generalized) (M62.81)     Time: 8881-8854 PT Time Calculation (min) (ACUTE ONLY): 27 min  Charges:    $Gait Training: 23-37 mins PT General Charges $$ ACUTE PT VISIT: 1 Visit                     [facility] PT Acute Rehabilitation Services Office [phone]    [facility]gers ORN [facility] 08/17/2023, 12:16 PM

## 2023-08-18 ENCOUNTER — Telehealth: Payer: Self-pay

## 2023-08-18 LAB — CBC AND DIFFERENTIAL
HCT: 27 — AB (ref 41–53)
Hemoglobin: 8.8 — AB (ref 13.5–17.5)
Platelets: 94 10*3/uL — AB (ref 150–400)
WBC: 4.9

## 2023-08-18 LAB — BASIC METABOLIC PANEL
BUN: 55 — AB (ref 4–21)
Chloride: 94 — AB (ref 99–108)
Creatinine: 4.9 — AB (ref 0.6–1.3)
Glucose: 122
Potassium: 3.5 meq/L (ref 3.5–5.1)
Sodium: 129 — AB (ref 137–147)

## 2023-08-18 LAB — HEPATITIS B SURFACE ANTIGEN

## 2023-08-18 LAB — COMPREHENSIVE METABOLIC PANEL: Albumin: 3.6 (ref 3.5–5.0)

## 2023-08-18 LAB — CBC: RBC: 2.92 — AB (ref 3.87–5.11)

## 2023-08-18 NOTE — Transitions of Care (Post Inpatient/ED Visit) (Signed)
 08/18/2023  Name: Andrew Schmitt MRN: [id number] DOB: [birth year]  Today's TOC FU Call Status: Today's TOC FU Call Status:: Successful TOC FU Call Completed TOC FU Call Complete Date: 08/18/23 Patient's Name and Date of Birth confirmed.  Transition Care Management Follow-up Telephone Call Date of Discharge: 08/17/23 Discharge Facility: [facility]) Type of Discharge: Inpatient Admission Primary Inpatient Discharge Diagnosis:: 'symptomatic bradycardia How have you been since you were released from the hospital?: Better (Daughter stets pt doig well-rested well last night-no complaining of any pain-has been eating good-she is not with pt at present as she returned home-to [place]) Any questions or concerns?: Yes Patient Questions/Concerns:: Upon review of d/c instructions/paperwork-discussed with daughter paperwork has listed 1st outpt HD appt for today-Thurs-08/18/23-with 12:15pm chair time-arrival at 11:30am to complete paperwork-she is unsure if her parents are aware of this as date/time changes several times-she will contact them ASAP to see if pt can get there as well as contact Tracy-HD coordinator-denies needing RN CM assistance with the matter at this time  Items Reviewed: Did you receive and understand the discharge instructions provided?: Yes Medications obtained,verified, and reconciled?: No Medications Not Reviewed Reasons:: Other: (daughter not with pt , meds or med list-unable to review meds) Any new allergies since your discharge?: No Dietary orders reviewed?: Yes Type of Diet Ordered:: renal/low salt/heart healthy Do you have support at home?: Yes People in Home: spouse Name of Support/Comfort Primary Source: Juanita  Medications Reviewed Today: Medications Reviewed Today   Medications were not reviewed in this encounter     Home Care and Equipment/Supplies: Were Home Health Services Ordered?: Yes Name of Home Health Agency:: [facility] Has Agency set up a time to  come to your home?: No (advised to expect call from agency within 48hrs post d/c- instructed to f/u with agency if no call-confirmed contact info on d/c paperwork) Any new equipment or medical supplies ordered?: Yes Name of Medical supply agency?: [employer]-hosp bed, BSC Were you able to get the equipment/medical supplies?: Yes Do you have any questions related to the use of the equipment/supplies?: No  Functional Questionnaire: Do you need assistance with bathing/showering or dressing?: Yes (wife/family assisting with ADLs/IADLs) Do you need assistance with meal preparation?: Yes Do you need assistance with eating?: No Do you have difficulty maintaining continence: No Do you need assistance with getting out of bed/getting out of a chair/moving?: Yes Do you have difficulty managing or taking your medications?: Yes  Follow up appointments reviewed: PCP Follow-up appointment confirmed?: No (family will call to arrange at later time- unable to make appt during call-call ended early to f/u on HD) MD Provider Line Number:[phone] Given: No Specialist Hospital Follow-up appointment confirmed?: No Reason Specialist Follow-Up Not Confirmed: Patient has Specialist Provider Number and will Call for Appointment Do you need transportation to your follow-up appointment?: No  SDOH Interventions Today    Flowsheet Row Most Recent Value  SDOH Interventions   Food Insecurity Interventions Patient Unable to Answer  Housing Interventions Patient Unable to Answer  Transportation Interventions Patient Unable to Answer  Utilities Interventions Patient Unable to Answer      Interventions Today    Flowsheet Row Most Recent Value  Chronic Disease   Chronic disease during today's visit Chronic Kidney Disease/End Stage Renal Disease (ESRD)  [importance of compliance/adherence to HD schedule]  General Interventions   General Interventions Discussed/Reviewed General Interventions Discussed, Durable Medical  Equipment (DME), Doctor Visits  Doctor Visits Discussed/Reviewed Doctor Visits Discussed, PCP, Specialist  Durable Medical Equipment (  DME) Other  [hosp bed, BSC]  PCP/Specialist Visits Compliance with follow-up visit  Education Interventions   Education Provided Provided Education  Provided Verbal Education On Other  Pharmacy Interventions   Pharmacy Dicussed/Reviewed Pharmacy Topics Discussed        TOC Interventions Today    Flowsheet Row Most Recent Value  TOC Interventions   TOC Interventions Discussed/Reviewed TOC Interventions Discussed       Rama Pilling, RN,BSN,CCM RN Care Manager Transitions of Care  [place]  Direct Phone: [phone] Toll Free: [phone] Fax: [phone]

## 2023-08-18 NOTE — Progress Notes (Signed)
 Late Note Entry- Jan. 2, 2025  Pt was d/c yesterday. Contacted TCU RN this morning to be advised of pt's d/c date and that pt should start today.   Olivia Canter Renal Navigator [phone]

## 2023-08-20 DIAGNOSIS — D696 Thrombocytopenia, unspecified: Secondary | ICD-10-CM | POA: Diagnosis not present

## 2023-08-20 DIAGNOSIS — J9601 Acute respiratory failure with hypoxia: Secondary | ICD-10-CM | POA: Diagnosis not present

## 2023-08-20 DIAGNOSIS — I5033 Acute on chronic diastolic (congestive) heart failure: Secondary | ICD-10-CM | POA: Diagnosis not present

## 2023-08-20 DIAGNOSIS — N186 End stage renal disease: Secondary | ICD-10-CM | POA: Diagnosis not present

## 2023-08-20 DIAGNOSIS — J90 Pleural effusion, not elsewhere classified: Secondary | ICD-10-CM | POA: Diagnosis not present

## 2023-08-20 DIAGNOSIS — E440 Moderate protein-calorie malnutrition: Secondary | ICD-10-CM | POA: Diagnosis not present

## 2023-08-20 DIAGNOSIS — N179 Acute kidney failure, unspecified: Secondary | ICD-10-CM | POA: Diagnosis not present

## 2023-08-20 DIAGNOSIS — J9602 Acute respiratory failure with hypercapnia: Secondary | ICD-10-CM | POA: Diagnosis not present

## 2023-08-20 DIAGNOSIS — I130 Hypertensive heart and chronic kidney disease with heart failure and stage 1 through stage 4 chronic kidney disease, or unspecified chronic kidney disease: Secondary | ICD-10-CM | POA: Diagnosis not present

## 2023-08-22 DIAGNOSIS — N179 Acute kidney failure, unspecified: Secondary | ICD-10-CM | POA: Diagnosis not present

## 2023-08-23 DIAGNOSIS — N179 Acute kidney failure, unspecified: Secondary | ICD-10-CM | POA: Diagnosis not present

## 2023-08-24 ENCOUNTER — Ambulatory Visit: Payer: Medicare PPO | Admitting: Internal Medicine

## 2023-08-24 ENCOUNTER — Encounter: Payer: Self-pay | Admitting: Internal Medicine

## 2023-08-24 VITALS — BP 110/82 | Temp 97.6°F | Ht 66.0 in | Wt 232.0 lb

## 2023-08-24 DIAGNOSIS — I5033 Acute on chronic diastolic (congestive) heart failure: Secondary | ICD-10-CM

## 2023-08-24 DIAGNOSIS — R310 Gross hematuria: Secondary | ICD-10-CM | POA: Diagnosis not present

## 2023-08-24 DIAGNOSIS — E66812 Obesity, class 2: Secondary | ICD-10-CM

## 2023-08-24 DIAGNOSIS — I132 Hypertensive heart and chronic kidney disease with heart failure and with stage 5 chronic kidney disease, or end stage renal disease: Secondary | ICD-10-CM | POA: Diagnosis not present

## 2023-08-24 DIAGNOSIS — N2581 Secondary hyperparathyroidism of renal origin: Secondary | ICD-10-CM | POA: Diagnosis not present

## 2023-08-24 DIAGNOSIS — J90 Pleural effusion, not elsewhere classified: Secondary | ICD-10-CM

## 2023-08-24 DIAGNOSIS — D62 Acute posthemorrhagic anemia: Secondary | ICD-10-CM | POA: Diagnosis not present

## 2023-08-24 DIAGNOSIS — N189 Chronic kidney disease, unspecified: Secondary | ICD-10-CM | POA: Diagnosis not present

## 2023-08-24 DIAGNOSIS — Z6837 Body mass index (BMI) 37.0-37.9, adult: Secondary | ICD-10-CM

## 2023-08-24 DIAGNOSIS — D4121 Neoplasm of uncertain behavior of right ureter: Secondary | ICD-10-CM

## 2023-08-24 DIAGNOSIS — D696 Thrombocytopenia, unspecified: Secondary | ICD-10-CM

## 2023-08-24 DIAGNOSIS — N179 Acute kidney failure, unspecified: Secondary | ICD-10-CM | POA: Diagnosis not present

## 2023-08-24 DIAGNOSIS — D4101 Neoplasm of uncertain behavior of right kidney: Secondary | ICD-10-CM

## 2023-08-24 DIAGNOSIS — N186 End stage renal disease: Secondary | ICD-10-CM

## 2023-08-24 NOTE — Progress Notes (Signed)
 I,[name], CMA,acting as a neurosurgeon for [name], MD.,have documented all relevant documentation on the behalf of [name], MD,as directed by  [name], MD while in the presence of [name], MD.  Subjective:  Patient ID: Andrew Schmitt , male    DOB: 11-07-1950 , 73 y.o.   MRN: [id number]  Chief Complaint  Patient presents with   Hospitalization Follow-up    HPI  Patient presents today for hospital follow up. He is accompanied by his wife today.  He presented to [facility] on 07/20/24 for further evaluation of shortness of breath.  Hospital course: found to have evidence of volume overload evidenced by pleural effusions, pulmonary edema, and diffuse body wall edema.  Patient was initially managed on Lasix  IV which was transitioned to volume management via hemodialysis secondary to patient progression to ESRD and need for hemodialysis.  Hospitalization was complicated by acute blood loss anemia secondary to gross hematuria requiring urology consult and continuous bladder irrigation.  Patient required a total of 4 units of PRBC this admission for recurrent/persistent anemia.  Hematuria resolved prior to discharge.    He was discharged on 08/17/23 in stable condition. He currently has dialysis three days per week. He is hoping his kidney function will improve so he can stop dialysis. Since discharge, he feels well.  He has noticed more frequent stools recently.   Hypertension This is a chronic problem. The current episode started more than 1 year ago. The problem has been gradually improving since onset. Pertinent negatives include no blurred vision or palpitations. Risk factors for coronary artery disease include obesity, sedentary lifestyle, male gender and dyslipidemia. Past treatments include direct vasodilators.     Past Medical History:  Diagnosis Date   Ascending aortic aneurysm (HCC) 03/19/2022   BPH (benign prostatic hyperplasia)    CKD  (chronic kidney disease) stage 3, GFR 30-59 ml/min (HCC) 04/05/2019   CKD (chronic kidney disease), stage IV (HCC) 04/05/2019   Coronary artery calcification 03/19/2022   Erectile dysfunction    Gout    Gout    Heart murmur    Hyperlipidemia    Hypertension    Obesity      Family History  Problem Relation Age of Onset   Healthy Mother    Healthy Father      Current Outpatient Medications:    allopurinol  (ZYLOPRIM ) 100 MG tablet, Take 1 tablet by mouth after dialysis on Monday, Tuesday, Thursday, and Friday.  Titrate as recommended by nephrology., Disp: 16 tablet, Rfl: 1   calcium  acetate (PHOSLO ) 667 MG capsule, Take 1 capsule (667 mg total) by mouth 3 (three) times daily with meals., Disp: 90 capsule, Rfl: 1   docusate sodium (COLACE) 50 MG capsule, Take 400 mg by mouth daily., Disp: , Rfl:    empagliflozin (JARDIANCE) 25 MG TABS tablet, Take 25 mg by mouth daily., Disp: , Rfl:    finasteride  (PROSCAR ) 5 MG tablet, TAKE 1 TABLET BY MOUTH EVERY DAY, Disp: 90 tablet, Rfl: 1   Homeopathic Products (SIMILASAN DRY EYE RELIEF) SOLN, Apply 2 drops to eye as needed (Dry eyes)., Disp: , Rfl:    Iron -Vitamins (S.S.S. TONIC PO), Take 20 mLs by mouth daily., Disp: , Rfl:    pantoprazole (PROTONIX) 40 MG tablet, Take 40 mg by mouth daily., Disp: , Rfl:    rosuvastatin  (CRESTOR ) 10 MG tablet, Take 1 tablet (10 mg total) by mouth daily., Disp: 30 tablet, Rfl: 1   tamsulosin  (FLOMAX )  0.4 MG CAPS capsule, TAKE 1 CAPSLE BY MOUTH DAILY, Disp: 90 capsule, Rfl: 3   torsemide  (DEMADEX ) 100 MG tablet, Take 100 mg by mouth daily., Disp: , Rfl:    acetaminophen  (TYLENOL ) 500 MG tablet, Take 1,000 mg by mouth daily., Disp: , Rfl:    sodium chloride  (OCEAN) 0.65 % SOLN nasal spray, Place 1 spray into both nostrils 3 (three) times a week., Disp: , Rfl:    Allergies  Allergen Reactions   Ace Inhibitors Cough     Review of Systems  Constitutional: Negative.   HENT: Negative.    Eyes:  Negative for blurred  vision.  Cardiovascular:  Negative for palpitations.  Gastrointestinal: Negative.   Genitourinary: Negative.   Allergic/Immunologic: Negative.   Neurological: Negative.   Hematological: Negative.      Today's Vitals   08/24/23 1209  BP: 110/82  Temp: 97.6 F (36.4 C)  SpO2: 98%  Weight: 232 lb (105.2 kg)  Height: 5' 6 (1.676 m)   Body mass index is 37.45 kg/m.  Wt Readings from Last 3 Encounters:  08/24/23 232 lb (105.2 kg)  08/17/23 213 lb 13.5 oz (97 kg)  07/07/23 249 lb (112.9 kg)     Objective:  Physical Exam Vitals and nursing note reviewed.  Constitutional:      Appearance: Normal appearance. He is obese.  HENT:     Head: Normocephalic and atraumatic.  Eyes:     Extraocular Movements: Extraocular movements intact.  Cardiovascular:     Rate and Rhythm: Normal rate and regular rhythm.     Heart sounds: Normal heart sounds.  Pulmonary:     Effort: Pulmonary effort is normal.     Breath sounds: Normal breath sounds.  Skin:    General: Skin is warm.  Neurological:     General: No focal deficit present.     Mental Status: He is alert.  Psychiatric:        Mood and Affect: Mood normal.     IMAGING: EXAM: CT ABDOMEN AND PELVIS WITHOUT CONTRAST   TECHNIQUE: Multidetector CT imaging of the abdomen and pelvis was performed following the standard protocol without IV contrast.   RADIATION DOSE REDUCTION: This exam was performed according to the departmental dose-optimization program which includes automated exposure control, adjustment of the mA and/or kV according to patient size and/or use of iterative reconstruction technique.   COMPARISON:  CT of the chest abdomen pelvis dated 07/21/2023.   FINDINGS: Evaluation of this exam is limited in the absence of intravenous contrast.   Lower chest: Small bilateral pleural effusions and bibasilar atelectasis.   No intra-abdominal free air or free fluid.   Hepatobiliary: The liver is unremarkable. No  biliary dilatation. The gallbladder is unremarkable   Pancreas: The pancreas is unremarkable as visualized.   Spleen: Normal in size without focal abnormality.   Adrenals/Urinary Tract: The adrenal glands are unremarkable. There is no hydronephrosis or nephrolithiasis on either side. Bilateral renal cysts and additional subcentimeter hypodense lesions which are not characterized on this CT. There is a 3 cm high attenuating lesion in the interpolar right kidney which is not characterized on this CT and may represent a proteinaceous or hemorrhagic cyst. A solid mass is not excluded further evaluation with ultrasound or MRI on a nonemergent/outpatient basis recommended. The visualized ureters appear unremarkable. The urinary bladder is decompressed around a Foley catheter. There is moderate amount of high attenuating content within the bladder consistent with blood products/clot.   Stomach/Bowel: There is moderate sigmoid diverticulosis  without active inflammatory changes. There is moderate stool throughout the colon. No bowel obstruction or active inflammation. The appendix is normal.   Vascular/Lymphatic: Mild aortoiliac atherosclerotic disease. The IVC is unremarkable. No portal venous gas. There is no adenopathy.   Reproductive: Mildly enlarged prostate gland measuring 5.3 cm in transverse axial diameter. The seminal vesicles are symmetric.   Other: Bilateral anterior abdominal wall subcutaneous densities consistent with injections.   Musculoskeletal: Degenerative changes of the spine and left hip. No acute osseous pathology.   IMPRESSION: 1. No hydronephrosis or nephrolithiasis. 2. Moderate amount of blood products/clot within the urinary bladder. 3. A 3 cm indeterminate high attenuating lesion in the interpolar right kidney. Further evaluation with ultrasound or MRI on a nonemergent/outpatient basis recommended. 4. Moderate sigmoid diverticulosis. No bowel obstruction.  Normal appendix. 5. Small bilateral pleural effusions and bibasilar atelectasis. 6.  Aortic Atherosclerosis (ICD10-I70.0).      Assessment And Plan:  Acute on chronic diastolic CHF (congestive heart failure) (HCC) Assessment & Plan: TCM PERFORMED. A MEMBER OF THE CLINICAL TEAM SPOKE WITH THE PATIENT UPON DISCHARGE. DISCHARGE SUMMARY WAS REVIEWED IN FULL DETAIL DURING THE VISIT. MEDS RECONCILED AND COMPARED TO DISCHARGE MEDS. MEDICATION LIST WAS UPDATED AND REVIEWED WITH THE PATIENT. GREATER THAN 50% FACE TO FACE TIME WAS SPENT IN COUNSELING AND COORDINATION OF CARE. ALL QUESTIONS WERE ANSWERED TO THE SATISFACTION OF THE PATIENT.  Echocardiogram results: he has  preserved LV systolic function with EF 65 to 70%, moderate LVH. RV systolic function preserved, LA with moderate dilatation, no significant valvular disease.  Unfortunately, diuretic therapy was ineffective. Therefore, he was started on renal replacement therapy with ultrafiltration. He has not had any issues.  Importance of dietary/medication compliance was discussed with the patient.      ABLA (acute blood loss anemia) Assessment & Plan: Secondary to gross hematuria. Patient required 4 units of PRBCs via transfusion. Hb was 8.3 at discharge.    Gross hematuria Assessment & Plan: He was evaluated by Urology during hospitalization. He does report traumatic Foley insertion, bladder irrigation was performed. Sx did resolve prior to discharge. He agrees to f/u with Urology.    Acute kidney injury superimposed on chronic kidney disease [facility]) Assessment & Plan: Patient initially with AKI on CKD. He now requires hemodialysis. Right internal jugular tunneled hemodialysis catheter placed on 12/24 and patient set up with outpatient hemodialysis.    Exudative pleural effusion Assessment & Plan: Cytology was negative for malignant cells.  Pleural fluid flow cytometry significant for B cells with mild kappa excess of uncertain clinical  significance.  Recommendation for repeat flow cytometry if pleural effusion persists or returns.    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  BP soft during hospitalization, previous meds d/c'd.  Importance of dietary compliance was discussed with the patient.    Secondary renal hyperparathyroidism [facility]) Assessment & Plan: Chronic, he has Renal panel performed with Nephrology. Will review lab results performed at dialysis.    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 There is a 3cm indeterminate high attenuating lesion in right kidney. He has Urology eval later this week, will defer management to Urology. He needs to have renal u/s or MRA abdomen wihtin next 3-6 months. If not done by next visit, I will order   Class 2 severe obesity due to excess calories with serious comorbidity and  body mass index (BMI) of 37.0 to 37.9 in adult [facility]) Assessment & Plan: He was made aware of 19lb weight gain in the past week. Importance of following recommended low sodium diet and medication compliance was discussed with the patient and his wife in detail.    She is encouraged to strive for BMI less than 30 to decrease cardiac risk. Advised to aim for at least 150 minutes of exercise per week.    Return if symptoms worsen or fail to improve.  Patient was given opportunity to ask questions. Patient verbalized understanding of the plan and was able to repeat key elements of the plan. All questions were answered to their satisfaction.    I, [name], MD, have reviewed all documentation for this visit. The documentation on 08/24/23 for the exam, diagnosis, procedures, and orders are all accurate and complete.   IF YOU HAVE BEEN REFERRED TO A SPECIALIST, IT MAY TAKE 1-2 WEEKS TO SCHEDULE/PROCESS THE  REFERRAL. IF YOU HAVE NOT HEARD FROM US /SPECIALIST IN TWO WEEKS, PLEASE GIVE US  A CALL AT [phone] X [phone].   THE PATIENT IS ENCOURAGED TO PRACTICE SOCIAL DISTANCING DUE TO THE COVID-19 PANDEMIC.

## 2023-08-25 DIAGNOSIS — N179 Acute kidney failure, unspecified: Secondary | ICD-10-CM | POA: Diagnosis not present

## 2023-08-25 DIAGNOSIS — Z992 Dependence on renal dialysis: Secondary | ICD-10-CM | POA: Diagnosis not present

## 2023-08-25 DIAGNOSIS — N2581 Secondary hyperparathyroidism of renal origin: Secondary | ICD-10-CM | POA: Diagnosis not present

## 2023-08-26 DIAGNOSIS — Z992 Dependence on renal dialysis: Secondary | ICD-10-CM | POA: Diagnosis not present

## 2023-08-26 DIAGNOSIS — R310 Gross hematuria: Secondary | ICD-10-CM | POA: Diagnosis not present

## 2023-08-26 DIAGNOSIS — N2581 Secondary hyperparathyroidism of renal origin: Secondary | ICD-10-CM | POA: Diagnosis not present

## 2023-08-26 DIAGNOSIS — N179 Acute kidney failure, unspecified: Secondary | ICD-10-CM | POA: Diagnosis not present

## 2023-08-29 DIAGNOSIS — J9601 Acute respiratory failure with hypoxia: Secondary | ICD-10-CM | POA: Diagnosis not present

## 2023-08-29 DIAGNOSIS — J9602 Acute respiratory failure with hypercapnia: Secondary | ICD-10-CM | POA: Diagnosis not present

## 2023-08-29 DIAGNOSIS — N2581 Secondary hyperparathyroidism of renal origin: Secondary | ICD-10-CM | POA: Diagnosis not present

## 2023-08-29 DIAGNOSIS — D696 Thrombocytopenia, unspecified: Secondary | ICD-10-CM | POA: Diagnosis not present

## 2023-08-29 DIAGNOSIS — Z992 Dependence on renal dialysis: Secondary | ICD-10-CM | POA: Diagnosis not present

## 2023-08-29 DIAGNOSIS — I130 Hypertensive heart and chronic kidney disease with heart failure and stage 1 through stage 4 chronic kidney disease, or unspecified chronic kidney disease: Secondary | ICD-10-CM | POA: Diagnosis not present

## 2023-08-29 DIAGNOSIS — N179 Acute kidney failure, unspecified: Secondary | ICD-10-CM | POA: Diagnosis not present

## 2023-08-29 DIAGNOSIS — I5033 Acute on chronic diastolic (congestive) heart failure: Secondary | ICD-10-CM | POA: Diagnosis not present

## 2023-08-29 DIAGNOSIS — J90 Pleural effusion, not elsewhere classified: Secondary | ICD-10-CM | POA: Diagnosis not present

## 2023-08-29 DIAGNOSIS — E440 Moderate protein-calorie malnutrition: Secondary | ICD-10-CM | POA: Diagnosis not present

## 2023-08-29 DIAGNOSIS — N186 End stage renal disease: Secondary | ICD-10-CM | POA: Diagnosis not present

## 2023-08-30 DIAGNOSIS — N2581 Secondary hyperparathyroidism of renal origin: Secondary | ICD-10-CM | POA: Diagnosis not present

## 2023-08-30 DIAGNOSIS — Z992 Dependence on renal dialysis: Secondary | ICD-10-CM | POA: Diagnosis not present

## 2023-08-30 DIAGNOSIS — N179 Acute kidney failure, unspecified: Secondary | ICD-10-CM | POA: Diagnosis not present

## 2023-08-31 DIAGNOSIS — J9602 Acute respiratory failure with hypercapnia: Secondary | ICD-10-CM | POA: Diagnosis not present

## 2023-08-31 DIAGNOSIS — E440 Moderate protein-calorie malnutrition: Secondary | ICD-10-CM | POA: Diagnosis not present

## 2023-08-31 DIAGNOSIS — N179 Acute kidney failure, unspecified: Secondary | ICD-10-CM | POA: Diagnosis not present

## 2023-08-31 DIAGNOSIS — J90 Pleural effusion, not elsewhere classified: Secondary | ICD-10-CM | POA: Diagnosis not present

## 2023-08-31 DIAGNOSIS — D696 Thrombocytopenia, unspecified: Secondary | ICD-10-CM | POA: Diagnosis not present

## 2023-08-31 DIAGNOSIS — J9601 Acute respiratory failure with hypoxia: Secondary | ICD-10-CM | POA: Diagnosis not present

## 2023-08-31 DIAGNOSIS — N186 End stage renal disease: Secondary | ICD-10-CM | POA: Diagnosis not present

## 2023-08-31 DIAGNOSIS — I130 Hypertensive heart and chronic kidney disease with heart failure and stage 1 through stage 4 chronic kidney disease, or unspecified chronic kidney disease: Secondary | ICD-10-CM | POA: Diagnosis not present

## 2023-08-31 DIAGNOSIS — I5033 Acute on chronic diastolic (congestive) heart failure: Secondary | ICD-10-CM | POA: Diagnosis not present

## 2023-09-01 DIAGNOSIS — N2581 Secondary hyperparathyroidism of renal origin: Secondary | ICD-10-CM | POA: Diagnosis not present

## 2023-09-01 DIAGNOSIS — Z992 Dependence on renal dialysis: Secondary | ICD-10-CM | POA: Diagnosis not present

## 2023-09-01 DIAGNOSIS — N179 Acute kidney failure, unspecified: Secondary | ICD-10-CM | POA: Diagnosis not present

## 2023-09-02 DIAGNOSIS — Z992 Dependence on renal dialysis: Secondary | ICD-10-CM | POA: Diagnosis not present

## 2023-09-02 DIAGNOSIS — N179 Acute kidney failure, unspecified: Secondary | ICD-10-CM | POA: Diagnosis not present

## 2023-09-02 DIAGNOSIS — N2581 Secondary hyperparathyroidism of renal origin: Secondary | ICD-10-CM | POA: Diagnosis not present

## 2023-09-05 DIAGNOSIS — N179 Acute kidney failure, unspecified: Secondary | ICD-10-CM | POA: Diagnosis not present

## 2023-09-05 DIAGNOSIS — N2581 Secondary hyperparathyroidism of renal origin: Secondary | ICD-10-CM | POA: Diagnosis not present

## 2023-09-05 DIAGNOSIS — Z992 Dependence on renal dialysis: Secondary | ICD-10-CM | POA: Diagnosis not present

## 2023-09-06 ENCOUNTER — Other Ambulatory Visit: Payer: Self-pay | Admitting: Urology

## 2023-09-06 DIAGNOSIS — N2581 Secondary hyperparathyroidism of renal origin: Secondary | ICD-10-CM | POA: Diagnosis not present

## 2023-09-06 DIAGNOSIS — N179 Acute kidney failure, unspecified: Secondary | ICD-10-CM | POA: Diagnosis not present

## 2023-09-06 DIAGNOSIS — Z992 Dependence on renal dialysis: Secondary | ICD-10-CM | POA: Diagnosis not present

## 2023-09-07 ENCOUNTER — Other Ambulatory Visit (INDEPENDENT_AMBULATORY_CARE_PROVIDER_SITE_OTHER): Payer: Self-pay | Admitting: Internal Medicine

## 2023-09-07 DIAGNOSIS — N2581 Secondary hyperparathyroidism of renal origin: Secondary | ICD-10-CM | POA: Diagnosis not present

## 2023-09-07 DIAGNOSIS — J810 Acute pulmonary edema: Secondary | ICD-10-CM

## 2023-09-07 DIAGNOSIS — I2510 Atherosclerotic heart disease of native coronary artery without angina pectoris: Secondary | ICD-10-CM

## 2023-09-07 DIAGNOSIS — J90 Pleural effusion, not elsewhere classified: Secondary | ICD-10-CM

## 2023-09-07 DIAGNOSIS — I5032 Chronic diastolic (congestive) heart failure: Secondary | ICD-10-CM

## 2023-09-07 DIAGNOSIS — I5033 Acute on chronic diastolic (congestive) heart failure: Secondary | ICD-10-CM

## 2023-09-07 DIAGNOSIS — I7121 Aneurysm of the ascending aorta, without rupture: Secondary | ICD-10-CM

## 2023-09-07 DIAGNOSIS — I441 Atrioventricular block, second degree: Secondary | ICD-10-CM | POA: Diagnosis not present

## 2023-09-07 DIAGNOSIS — I132 Hypertensive heart and chronic kidney disease with heart failure and with stage 5 chronic kidney disease, or end stage renal disease: Secondary | ICD-10-CM | POA: Diagnosis not present

## 2023-09-07 NOTE — Progress Notes (Signed)
CC: home health certification  Received home health orders orders from [employer] [employer]. Start of care 08/20/23.   Certification and orders from 08/20/23 through 10/18/23 are reviewed, signed and faxed back to home health company.  Need of intermittent skilled services at home: SN, PT, OT  The home health care plan has been established by me and will be reviewed and updated as needed to maximize patient recovery.  I certify that all home health services have been and will be furnished to the patient while under my care.  Face-to-face encounter in which the need for home health services was established: 08/17/23  Patient is receiving home health services for the following diagnoses: Problem List Items Addressed This Visit       Cardiovascular and Mediastinum   Ascending aortic aneurysm (HCC)   Hypertensive heart and renal disease - Primary   Coronary artery calcification   Chronic diastolic heart failure (HCC)   Second degree AV block, Mobitz type I   Acute on chronic diastolic CHF (congestive heart failure) (HCC)     Respiratory   Pleural effusion on left   Acute pulmonary edema (HCC)     Endocrine   Secondary renal hyperparathyroidism (HCC)     Gwynneth Aliment, MD

## 2023-09-08 DIAGNOSIS — N179 Acute kidney failure, unspecified: Secondary | ICD-10-CM | POA: Diagnosis not present

## 2023-09-08 DIAGNOSIS — Z992 Dependence on renal dialysis: Secondary | ICD-10-CM | POA: Diagnosis not present

## 2023-09-08 DIAGNOSIS — N2581 Secondary hyperparathyroidism of renal origin: Secondary | ICD-10-CM | POA: Diagnosis not present

## 2023-09-09 DIAGNOSIS — Z992 Dependence on renal dialysis: Secondary | ICD-10-CM | POA: Diagnosis not present

## 2023-09-09 DIAGNOSIS — N2581 Secondary hyperparathyroidism of renal origin: Secondary | ICD-10-CM | POA: Diagnosis not present

## 2023-09-09 DIAGNOSIS — N179 Acute kidney failure, unspecified: Secondary | ICD-10-CM | POA: Diagnosis not present

## 2023-09-10 DIAGNOSIS — D696 Thrombocytopenia, unspecified: Secondary | ICD-10-CM | POA: Diagnosis not present

## 2023-09-10 DIAGNOSIS — J90 Pleural effusion, not elsewhere classified: Secondary | ICD-10-CM | POA: Diagnosis not present

## 2023-09-10 DIAGNOSIS — J9602 Acute respiratory failure with hypercapnia: Secondary | ICD-10-CM | POA: Diagnosis not present

## 2023-09-10 DIAGNOSIS — E440 Moderate protein-calorie malnutrition: Secondary | ICD-10-CM | POA: Diagnosis not present

## 2023-09-10 DIAGNOSIS — I130 Hypertensive heart and chronic kidney disease with heart failure and stage 1 through stage 4 chronic kidney disease, or unspecified chronic kidney disease: Secondary | ICD-10-CM | POA: Diagnosis not present

## 2023-09-10 DIAGNOSIS — N179 Acute kidney failure, unspecified: Secondary | ICD-10-CM | POA: Diagnosis not present

## 2023-09-10 DIAGNOSIS — N186 End stage renal disease: Secondary | ICD-10-CM | POA: Diagnosis not present

## 2023-09-10 DIAGNOSIS — I5033 Acute on chronic diastolic (congestive) heart failure: Secondary | ICD-10-CM | POA: Diagnosis not present

## 2023-09-10 DIAGNOSIS — J9601 Acute respiratory failure with hypoxia: Secondary | ICD-10-CM | POA: Diagnosis not present

## 2023-09-11 ENCOUNTER — Encounter: Payer: Self-pay | Admitting: Internal Medicine

## 2023-09-11 DIAGNOSIS — D4101 Neoplasm of uncertain behavior of right kidney: Secondary | ICD-10-CM | POA: Insufficient documentation

## 2023-09-11 DIAGNOSIS — D62 Acute posthemorrhagic anemia: Secondary | ICD-10-CM | POA: Insufficient documentation

## 2023-09-11 NOTE — Assessment & Plan Note (Signed)
Secondary to gross hematuria. Patient required 4 units of PRBCs via transfusion. Hb was 8.3 at discharge.

## 2023-09-11 NOTE — Assessment & Plan Note (Signed)
He was made aware of 19lb weight gain in the past week. Importance of following recommended low sodium diet and medication compliance was discussed with the patient and his wife in detail.

## 2023-09-11 NOTE — Assessment & Plan Note (Signed)
CT abd/pelvis results reviewed. There is a 3cm indeterminate high attenuating lesion in right kidney. He has Urology eval later this week, will defer management to Urology. He needs to have renal u/s or MRA abdomen wihtin next 3-6 months. If not done by next visit, I will order

## 2023-09-11 NOTE — Patient Instructions (Signed)
Chronic Kidney Disease in Adults: What to Know Chronic kidney disease (CKD) is when lasting damage happens to the kidneys slowly over time. The kidneys are two organs that do many important things in the body. These include: Taking waste and extra fluid out of the blood to make pee (urine). Making hormones. Keeping the right amount of fluids and chemicals in the body. A small amount of kidney damage may not cause problems. You must take steps to help keep the kidney damage from getting worse. A lot of damage may cause kidney failure. Kidney failure means the kidneys can no longer work right. What are the causes? Diabetes. High blood pressure. Diseases that affect the heart and blood vessels. Other kidney diseases. Diseases that affect the body's defense system (immune system). A problem with the flow of pee. This may be caused by: Kidney stones. Cancer. An enlarged prostate, in males. A kidney infection or urinary tract infection (UTI) that keeps coming back. What increases the risk? Getting older. The chances of having CKD increase with age. A family history of kidney disease or kidney failure. Having a disease caused by genes. Taking medicines that can harm the kidneys. Being near or having contact with harmful substances. Being very overweight. Using tobacco now or in the past. What are the signs or symptoms? Common symptoms of CKD include: Feeling very tired and having less energy. Swelling of the face, legs, ankles, or feet. Throwing up or feeling like you may throw up. Not wanting to eat as much as normal. Being confused or not able to focus. Twitches and cramps in the leg muscles or other muscles. Dry, itchy skin. Other symptoms may include: Shortness of breath. Trouble sleeping. Making less pee, or making more pee, especially at night. A taste of metal in your mouth. You may also become anemic. Anemia means there's not enough red blood cells in your blood. You may get  symptoms slowly. You may not notice them until the kidney damage gets very bad. How is this diagnosed? CKD may be diagnosed based on: Tests on your blood or pee. Imaging tests, like an ultrasound or a CT scan. A kidney biopsy. For this test, a sample of kidney tissue is removed to be looked at under a microscope. These tests will help to find out how serious the CKD is. How is this treated? Often, there's no cure for CKD. Treatment can help with symptoms and help keep the disease from getting worse. Treatment may include: Treating other problems that are causing your CKD or making it worse. Diet changes. You may need to: Avoid alcohol. Avoid foods that are high in salt, potassium, phosphorous, and protein. Taking medicines for symptoms and to help control other conditions. Dialysis. This treatment gets harmful waste out of your body. It may be needed if you have kidney failure. Follow these instructions at home: Medicines Take your medicines only as told. The amount of some medicines you take may need to be changed. Do not take any new medicines, vitamins, or supplements unless your health care provider says it's okay. These may make kidney damage worse. Lifestyle Do not smoke, vape, or use nicotine or tobacco. If you drink alcohol: Limit how much you have to: 0-1 drink a day if you're male. 0-2 drinks a day if you're male. Know how much alcohol is in your drink. In the [place] one drink is one 12 oz bottle of beer (355 mL), one 5 oz glass of wine (148 mL), or one 1 oz  glass of hard liquor (44 mL). Stay at a healthy weight. If you need help, ask your provider. General instructions  Eat and drink as told. Track your blood pressure at home. Tell your provider about any changes. If you have diabetes, track your blood sugar as told. Exercise at least 30 minutes a day, 5 days a week. Keep your shots (vaccinations) up to date. Keep all follow-up visits. Your provider may need to change  your treatments over time. Where to find support American Kidney Fund: [place].com.[employer] Kidney School: kidneyschool.org American Association of Kidney Patients: https://www.miller-montoya.com/ Where to find more information National Kidney Foundation: kidney.org Centers for Disease Control and Prevention. To learn more: Go to DiningCalendar.de. Click "Search". Type "chronic kidney disease" in the search box. Contact a health care provider if: You have new symptoms. You get symptoms of end-stage kidney disease. These include: Headaches. Numbness in your hands or feet. Leg cramps. Easy bruising. Get help right away if: You have a fever. You make less pee than usual. You have pain or bleeding when you pee or poop. You have chest pain. You have shortness of breath. These symptoms may be an emergency. Call 911 right away. Do not wait to see if the symptoms will go away. Do not drive yourself to the hospital. This information is not intended to replace advice given to you by your health care provider. Make sure you discuss any questions you have with your health care provider. Document Revised: 06/14/2023 Document Reviewed: 02/04/2023 Elsevier Patient Education  2024 [employer].

## 2023-09-11 NOTE — Assessment & Plan Note (Signed)
Chronic, he has Renal panel performed with Nephrology. Will review lab results performed at dialysis.

## 2023-09-11 NOTE — Assessment & Plan Note (Signed)
Chronic, well controlled.  BP soft during hospitalization, previous meds d/c'd.  Importance of dietary compliance was discussed with the patient.

## 2023-09-11 NOTE — Assessment & Plan Note (Addendum)
He was evaluated by Urology during hospitalization. He does report traumatic Foley insertion, bladder irrigation was performed. Sx did resolve prior to discharge. He agrees to f/u with Urology.

## 2023-09-11 NOTE — Assessment & Plan Note (Addendum)
Patient initially with AKI on CKD. He now requires hemodialysis. Right internal jugular tunneled hemodialysis catheter placed on 12/24 and patient set up with outpatient hemodialysis.

## 2023-09-11 NOTE — Assessment & Plan Note (Signed)
Cytology was negative for malignant cells.  Pleural fluid flow cytometry significant for B cells with mild kappa excess of uncertain clinical significance.  Recommendation for repeat flow cytometry if pleural effusion persists or returns.

## 2023-09-11 NOTE — Assessment & Plan Note (Signed)
TCM PERFORMED. A MEMBER OF THE CLINICAL TEAM SPOKE WITH THE PATIENT UPON DISCHARGE. DISCHARGE SUMMARY WAS REVIEWED IN FULL DETAIL DURING THE VISIT. MEDS RECONCILED AND COMPARED TO DISCHARGE MEDS. MEDICATION LIST WAS UPDATED AND REVIEWED WITH THE PATIENT. GREATER THAN 50% FACE TO FACE TIME WAS SPENT IN COUNSELING AND COORDINATION OF CARE. ALL QUESTIONS WERE ANSWERED TO THE SATISFACTION OF THE PATIENT.  Echocardiogram results: he has  preserved LV systolic function with EF 65 to 70%, moderate LVH. RV systolic function preserved, LA with moderate dilatation, no significant valvular disease.  Unfortunately, diuretic therapy was ineffective. Therefore, he was started on renal replacement therapy with ultrafiltration. He has not had any issues.  Importance of dietary/medication compliance was discussed with the patient.

## 2023-09-12 DIAGNOSIS — Z992 Dependence on renal dialysis: Secondary | ICD-10-CM | POA: Diagnosis not present

## 2023-09-12 DIAGNOSIS — N179 Acute kidney failure, unspecified: Secondary | ICD-10-CM | POA: Diagnosis not present

## 2023-09-12 DIAGNOSIS — N2581 Secondary hyperparathyroidism of renal origin: Secondary | ICD-10-CM | POA: Diagnosis not present

## 2023-09-12 DIAGNOSIS — J9601 Acute respiratory failure with hypoxia: Secondary | ICD-10-CM | POA: Diagnosis not present

## 2023-09-12 DIAGNOSIS — N400 Enlarged prostate without lower urinary tract symptoms: Secondary | ICD-10-CM | POA: Diagnosis not present

## 2023-09-12 DIAGNOSIS — N184 Chronic kidney disease, stage 4 (severe): Secondary | ICD-10-CM | POA: Diagnosis not present

## 2023-09-13 ENCOUNTER — Encounter (HOSPITAL_COMMUNITY): Payer: Self-pay

## 2023-09-13 ENCOUNTER — Ambulatory Visit (INDEPENDENT_AMBULATORY_CARE_PROVIDER_SITE_OTHER): Payer: Medicare PPO

## 2023-09-13 ENCOUNTER — Encounter: Payer: Self-pay | Admitting: Internal Medicine

## 2023-09-13 ENCOUNTER — Ambulatory Visit (HOSPITAL_COMMUNITY)
Admission: EM | Admit: 2023-09-13 | Discharge: 2023-09-13 | Disposition: A | Discharge status: Home / Self Care | Payer: Medicare PPO | Attending: Physician Assistant | Admitting: Physician Assistant

## 2023-09-13 DIAGNOSIS — N179 Acute kidney failure, unspecified: Secondary | ICD-10-CM | POA: Diagnosis not present

## 2023-09-13 DIAGNOSIS — R918 Other nonspecific abnormal finding of lung field: Secondary | ICD-10-CM | POA: Diagnosis not present

## 2023-09-13 DIAGNOSIS — J189 Pneumonia, unspecified organism: Secondary | ICD-10-CM | POA: Diagnosis not present

## 2023-09-13 DIAGNOSIS — R0602 Shortness of breath: Secondary | ICD-10-CM

## 2023-09-13 DIAGNOSIS — J90 Pleural effusion, not elsewhere classified: Secondary | ICD-10-CM | POA: Diagnosis not present

## 2023-09-13 DIAGNOSIS — Z992 Dependence on renal dialysis: Secondary | ICD-10-CM | POA: Diagnosis not present

## 2023-09-13 DIAGNOSIS — R059 Cough, unspecified: Secondary | ICD-10-CM | POA: Diagnosis not present

## 2023-09-13 DIAGNOSIS — N2581 Secondary hyperparathyroidism of renal origin: Secondary | ICD-10-CM | POA: Diagnosis not present

## 2023-09-13 LAB — POC COVID19/FLU A&B COMBO
Covid Antigen, POC: NEGATIVE
Influenza A Antigen, POC: NEGATIVE
Influenza B Antigen, POC: NEGATIVE

## 2023-09-13 MED ORDER — METHYLPREDNISOLONE SODIUM SUCC 125 MG IJ SOLR
INTRAMUSCULAR | Status: AC
  Filled 2023-09-13: qty 2

## 2023-09-13 MED ORDER — METHYLPREDNISOLONE SODIUM SUCC 125 MG IJ SOLR: 60.0000 mg | Freq: Once | INTRAMUSCULAR | Status: DC

## 2023-09-13 MED ORDER — ACETAMINOPHEN 325 MG PO TABS
ORAL_TABLET | ORAL | Status: AC
  Filled 2023-09-13: qty 2

## 2023-09-13 MED ORDER — ACETAMINOPHEN 325 MG PO TABS
650.0000 mg | ORAL_TABLET | Freq: Once | ORAL | Status: AC
  Administered 2023-09-13: 650 mg via ORAL

## 2023-09-13 MED ORDER — AZITHROMYCIN 250 MG PO TABS: ORAL_TABLET | ORAL | 0 refills | Status: DC

## 2023-09-13 MED ORDER — AMOXICILLIN-POT CLAVULANATE 500-125 MG PO TABS: 1.0000 | ORAL_TABLET | Freq: Two times a day (BID) | ORAL | 0 refills | Status: DC

## 2023-09-13 NOTE — ED Provider Notes (Addendum)
[facility]    CSN: [id number] Arrival date & time: 09/13/23  1541      History   Chief Complaint Chief Complaint  Patient presents with   Cough    HPI Andrew Schmitt is a 73 y.o. male.   HPI  He is here with his wife He has hx of CHF, dialysis and recent hospitalization for fluid complications  They state that he has been having SOB, productive cough, rapid development of fever since he finished dialysis earlier this afternoon  He reports ongoing diarrhea every time he eats ( states they have apt tomorrow with GI for evaluation)  He denies recent sick contacts   Spoke with his daughter, Felecia Shelling regarding plan of care. 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   Malnutrition of moderate degree 08/09/2023   Exudative pleural effusion 08/01/2023   BPH (benign prostatic hyperplasia) 07/27/2023   Obesity, class 2 07/27/2023   Pleural effusion 07/26/2023   Pleural effusion on left 07/22/2023   Second degree AV block, Mobitz type I 07/22/2023   Elevated troponin 07/22/2023   Hematuria 07/22/2023   Bilateral renal cysts 07/22/2023   Acute kidney injury superimposed on chronic kidney disease (HCC) 07/22/2023   Acute on chronic diastolic CHF (congestive heart failure) (HCC) 07/21/2023   Chronic diastolic heart failure (HCC) 07/14/2023   Stage 3b  chronic kidney disease (HCC) 07/07/2023   Chronic gout due to renal impairment without tophus 12/08/2022   Coronary artery calcification 03/19/2022   Ascending aortic aneurysm (HCC) 03/19/2022   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   Shortness of breath 10/15/2021   CKD (chronic kidney disease), stage IV (HCC) 04/05/2019   Hypertension with heart disease 03/07/2019   Hyperlipidemia 03/07/2019   Class 2 severe obesity due to excess calories with serious comorbidity and body mass index (BMI) of 37.0 to 37.9 in adult [facility]) 03/07/2019    Past Surgical History:  Procedure Laterality Date   CARDIAC SURGERY     IR FLUORO GUIDE CV LINE RIGHT  08/04/2023   IR FLUORO GUIDE CV LINE RIGHT  08/09/2023   IR US GUIDE VASC ACCESS RIGHT  08/04/2023   IR US GUIDE VASC ACCESS RIGHT  08/11/2023   THORACENTESIS Left 07/26/2023   Procedure: THORACENTESIS;  Surgeon: Karren Burly, MD;  Location: [facility] ENDOSCOPY;  Service: Pulmonary;  Laterality: Left;       Home Medications    Prior to Admission medications   Medication Sig Start Date End Date Taking? Authorizing Provider  acetaminophen (TYLENOL) 500 MG tablet Take 1,000 mg by mouth daily.    [provider]  allopurinol (ZYLOPRIM) 100 MG tablet Take 1 tablet by mouth after dialysis on Monday, Tuesday, Thursday, and Friday.  Titrate  as recommended by nephrology. 08/12/23   Zigmund Daniel., MD  calcium acetate (PHOSLO) 667 MG capsule Take 1 capsule (667 mg total) by mouth 3 (three) times daily with meals. 08/12/23 10/11/23  Zigmund Daniel., MD  docusate sodium (COLACE) 50 MG capsule Take 400 mg by mouth daily.    [provider]  empagliflozin (JARDIANCE) 25 MG TABS tablet Take 25 mg by mouth daily.    [provider]  finasteride (PROSCAR) 5 MG tablet TAKE 1 TABLET BY MOUTH EVERY DAY 12/17/22   Dorothyann Peng, MD  Homeopathic Products [facility]  DRY EYE RELIEF) SOLN Apply 2 drops to eye as needed (Dry eyes).    [provider]  Iron-Vitamins (S.S.S. TONIC PO) Take 20 mLs by mouth daily.    [provider]  pantoprazole (PROTONIX) 40 MG tablet Take 40 mg by mouth daily. 08/22/23   [provider]  rosuvastatin (CRESTOR) 10 MG tablet Take 1 tablet (10 mg total) by mouth daily. 08/12/23 09/11/23  Zigmund Daniel., MD  sodium chloride (OCEAN) 0.65 % SOLN nasal spray Place 1 spray into both nostrils 3 (three) times a week.    [provider]  tamsulosin (FLOMAX) 0.4 MG CAPS capsule TAKE 1 CAPSLE BY MOUTH DAILY 09/07/22   McKenzie, Mardene Celeste, MD  torsemide (DEMADEX) 100 MG tablet Take 100 mg by mouth daily. 08/23/23   [provider]    Family History Family History  Problem Relation Age of Onset   Healthy Mother    Healthy Father     Social History Social History   Tobacco Use   Smoking status: Never   Smokeless tobacco: Never  Vaping Use   Vaping status: Never Used  Substance Use Topics   Alcohol use: No   Drug use: No     Allergies   Ace inhibitors   Review of Systems Review of Systems  Constitutional:  Positive for fatigue and fever.  HENT:  Negative for congestion, ear pain, postnasal drip and sore throat.   Respiratory:  Positive for cough and shortness of breath. Negative for chest tightness and wheezing.   Cardiovascular:  Negative for chest pain, palpitations and leg swelling.  Gastrointestinal:  Positive for diarrhea. Negative for nausea and vomiting.  Musculoskeletal:  Negative for myalgias.  Neurological:  Negative for dizziness and headaches.     Physical Exam Triage Vital Signs ED Triage Vitals  Encounter Vitals Group     BP 09/13/23 1642 137/72     Systolic BP Percentile --      Diastolic BP Percentile --      Pulse Rate 09/13/23 1642 88     Resp 09/13/23 1642 18     Temp 09/13/23 1642 (!) 102.1 F (38.9 C)     Temp Source 09/13/23 1642 Oral      SpO2 09/13/23 1642 (!) 89 %     Weight --      Height --      Head Circumference --      Peak Flow --      Pain Score 09/13/23 1643 0     Pain Loc --      Pain Education --      Exclude from Growth Chart --    No data found.  Updated Vital Signs BP 137/72 (BP Location: Left Arm)   Pulse 88   Temp (!) 102.1 F (38.9 C) (Oral)   Resp 18   SpO2 (!) 89%   Visual Acuity  Right Eye Distance:   Left Eye Distance:   Bilateral Distance:    Right Eye Near:   Left Eye Near:    Bilateral Near:     Physical Exam Vitals reviewed.  Constitutional:      General: He is awake.     Appearance: Normal appearance. He is well-developed and well-groomed.  HENT:     Head: Normocephalic and atraumatic.  Cardiovascular:     Rate and Rhythm: Normal rate and regular rhythm.     Heart sounds: Murmur heard.     Systolic murmur is present with a grade of 2/6.     No friction rub. No gallop.  Pulmonary:     Effort: Pulmonary effort is normal.     Breath sounds: Decreased air movement present. Examination of the right-lower field reveals decreased breath sounds. Examination of the left-lower field reveals decreased breath sounds. Decreased breath sounds and wheezing present. No rhonchi or rales.  Neurological:     Mental Status: He is alert.  Psychiatric:        Mood and Affect: Mood normal.        Behavior: Behavior normal. Behavior is cooperative.        Thought Content: Thought content normal.        Judgment: Judgment normal.      UC Treatments / Results  Labs (all labs ordered are listed, but only abnormal results are displayed) Labs Reviewed  POC COVID19/FLU A&B COMBO    EKG   Radiology DG Chest 2 View Result Date: 09/13/2023 CLINICAL DATA:  Shortness of breath.  Hypoxemia.  Cough. EXAM: CHEST - 2 VIEW COMPARISON:  Radiographs 08/12/2023 and 08/01/2023.  CT 07/21/2023. FINDINGS: Right IJ hemodialysis catheter appears unchanged, terminating at the lower SVC level. Stable  cardiomegaly and mediastinal contours. Chronic left pleural effusion appears minimally enlarged compared with the most recent prior study. There is associated left basilar pulmonary opacity. The right lung is clear. No right-sided pleural effusion, edema or pneumothorax. The bones appear unchanged. IMPRESSION: Chronic left pleural effusion appears minimally enlarged compared with the most recent prior study. Associated left basilar pulmonary opacity could reflect atelectasis or pneumonia. Electronically Signed   By: Carey Bullocks M.D.   On: 09/13/2023 18:10    Procedures Procedures (including critical care time)  Medications Ordered in UC Medications  acetaminophen (TYLENOL) tablet 650 mg (650 mg Oral Given 09/13/23 1647)    Initial Impression / Assessment and Plan / UC Course  I have reviewed the triage vital signs and the nursing notes.  Pertinent labs & imaging results that were available during my care of the patient were reviewed by me and considered in my medical decision making (see chart for details).     Diminished breath sounds in lower lung fields and systolic murmur on cardiac exam. Patient and wife deny previous knowledge of heart murmur- he has hx of CHF Reviewed CXR and radiology interpretation- appears to have potential new atelectasis or pneumonia. Given symptoms will treat with outpatient CAP protocol with dialysis dosing.  Augmentin 500 mg PO BID - recommend dosing after dialysis to prevent reduction in efficacy. Will also give Zpack- no dose adjustment recommended per UTD  Will provide Medrol 60 mg injection in clinic to assist with breathing. He denies recent steroid use.  Discussed strict ED precautions with family and patient to include: reduced oxygen levels, increased SOB, persistent fever, confusion, low BP, loss of consciousness.  Recommend close follow up with PCP and specialist for monitoring  Final Clinical Impressions(s) / UC Diagnoses   Final diagnoses:   SOB (shortness of breath)  Community acquired pneumonia of left lower lobe of lung     Discharge Instructions      Per your wishes, we will try to manage your symptoms with outpatient care and treat you for pneumonia  I have sent in script for Augmentin 500 mg to be taken twice per day- on dialysis days, please try to take after your session to reduce the chance of lowered efficacy. I am also sending in a Zpack. Please complete both antibiotic courses as directed   If you develop any of the following, please go to the ED promptly: reduced oxygen levels, increased shortness of breath, persistent fever, confusion, low BP, loss of consciousness.  Please follow up with your PCP in the next 2-3 days to make sure you are starting to feel better.      ED Prescriptions   None    PDMP not reviewed this encounter.   Vayda Dungee, Oswaldo Conroy, PA-C 09/13/23 1826    Providence Crosby, PA-C 09/13/23 [id number]

## 2023-09-13 NOTE — Discharge Instructions (Addendum)
Per your wishes, we will try to manage your symptoms with outpatient care and treat you for pneumonia  I have sent in script for Augmentin 500 mg to be taken twice per day- on dialysis days, please try to take after your session to reduce the chance of lowered efficacy. I am also sending in a Zpack. Please complete both antibiotic courses as directed  You can take Tylenol for fever and pain management  If you develop any of the following, please go to the ED promptly: reduced oxygen levels, increased shortness of breath, persistent fever, confusion, low BP, loss of consciousness.  Please follow up with your PCP in the next 2-3 days to make sure you are starting to feel better.

## 2023-09-13 NOTE — ED Triage Notes (Signed)
Per wife pt having cough and sob for the past 8 days.

## 2023-09-14 ENCOUNTER — Ambulatory Visit: Payer: Self-pay | Admitting: Family Medicine

## 2023-09-14 DIAGNOSIS — R197 Diarrhea, unspecified: Secondary | ICD-10-CM | POA: Diagnosis not present

## 2023-09-15 DIAGNOSIS — N2581 Secondary hyperparathyroidism of renal origin: Secondary | ICD-10-CM | POA: Diagnosis not present

## 2023-09-15 DIAGNOSIS — N179 Acute kidney failure, unspecified: Secondary | ICD-10-CM | POA: Diagnosis not present

## 2023-09-15 DIAGNOSIS — R197 Diarrhea, unspecified: Secondary | ICD-10-CM | POA: Diagnosis not present

## 2023-09-15 DIAGNOSIS — Z992 Dependence on renal dialysis: Secondary | ICD-10-CM | POA: Diagnosis not present

## 2023-09-16 DIAGNOSIS — N179 Acute kidney failure, unspecified: Secondary | ICD-10-CM | POA: Diagnosis not present

## 2023-09-16 DIAGNOSIS — Z992 Dependence on renal dialysis: Secondary | ICD-10-CM | POA: Diagnosis not present

## 2023-09-16 DIAGNOSIS — N2581 Secondary hyperparathyroidism of renal origin: Secondary | ICD-10-CM | POA: Diagnosis not present

## 2023-09-19 DIAGNOSIS — Z992 Dependence on renal dialysis: Secondary | ICD-10-CM | POA: Diagnosis not present

## 2023-09-19 DIAGNOSIS — N2581 Secondary hyperparathyroidism of renal origin: Secondary | ICD-10-CM | POA: Diagnosis not present

## 2023-09-19 DIAGNOSIS — N179 Acute kidney failure, unspecified: Secondary | ICD-10-CM | POA: Diagnosis not present

## 2023-09-20 DIAGNOSIS — N179 Acute kidney failure, unspecified: Secondary | ICD-10-CM | POA: Diagnosis not present

## 2023-09-20 DIAGNOSIS — N2581 Secondary hyperparathyroidism of renal origin: Secondary | ICD-10-CM | POA: Diagnosis not present

## 2023-09-20 DIAGNOSIS — Z992 Dependence on renal dialysis: Secondary | ICD-10-CM | POA: Diagnosis not present

## 2023-09-20 NOTE — Progress Notes (Addendum)
 COVID Vaccine received:  []  No [x]  Yes Date of any COVID positive Test in last 90 days:  none  PCP - Catheryn Slocumb, MD  Cardiologist - Annabella Scarce, MD   LOV 03-19-2022 Nephrology- Dr. Bernardino Gasman at [facility]  Chest x-ray - 09-13-2023   2v  Epic EKG - 08-04-2023  Epic  Stress Test -  ECHO - 07-23-2023  Epic Cardiac Cath -   PCR screen: []  Ordered & Completed []   No Order but Needs PROFEND     [x]   N/A for this surgery  Surgery Plan:  [x]  Ambulatory   []  Outpatient in bed  []  Admit Anesthesia:    [x]  General  []  Spinal  []   Choice []   MAC  Pacemaker / ICD device [x]  No []  Yes   Spinal Cord Stimulator:[x]  No []  Yes       History of Sleep Apnea? [x]  No []  Yes   CPAP used?- [x]  No []  Yes    Does the patient monitor blood sugar?   [x]  N/A   []  No []  Yes  Patient has: [x]  NO Hx DM   []  Pre-DM   []  DM1  []   DM2  Blood Thinner / Instructions:   none Aspirin  Instructions:   None  ERAS Protocol Ordered: [x]  No  []  Yes Patient is to be NPO after: midnight pior  Dental hx: []  Dentures:  [x]  N/A      []  Bridge or Partial:                   []  Loose or Damaged teeth:   Comments: Has HD at [facility] on M,T, Th, F  via Right IJ  Activity level: Patient is unable to climb a flight of stairs without difficulty; [x]  No CP but would have SOB.   Patient can perform ADLs without assistance.   Anesthesia review: CHF, chronic left pleural effusions, HTN, 2 AVB, systolic heart murmur  Patient has shortness of breath, cough at PAT appointment. No fever or CP.   Patient verbalized understanding and agreement to the Pre-Surgical Instructions that were given to them at this PAT appointment. Patient was also educated of the need to review these PAT instructions again prior to his surgery.I reviewed the appropriate phone numbers to call if they have any and questions or concerns.

## 2023-09-20 NOTE — Patient Instructions (Addendum)
SURGICAL WAITING ROOM VISITATION  Patients having surgery or a procedure may have no more than 2 support people in the waiting area - these visitors may rotate.    Children under the age of 40 must have an adult with them who is not the patient.  Due to an increase in RSV and influenza rates and associated hospitalizations, children ages 40 and under may not visit patients in [facility] hospitals.  Visitors with respiratory illnesses are discouraged from visiting and should remain at home.  If the patient needs to stay at the hospital during part of their recovery, the visitor guidelines for inpatient rooms apply. Pre-op nurse will coordinate an appropriate time for 1 support person to accompany patient in pre-op.  This support person may not rotate.    Please refer to the [facility] website for the visitor guidelines for Inpatients (after your surgery is over and you are in a regular room).    Your procedure is scheduled [id number]  September 28, 2023   Report to [facility] Main Entrance    Report to admitting at 8:15 AM   Call this number if you have problems the morning of surgery [phone]   Do not eat food or drink liquids :After Midnight the night before your surgery..          If you have questions, please contact your surgeon's office.   FOLLOW BOWEL PREP AND ANY ADDITIONAL PRE OP INSTRUCTIONS YOU RECEIVED FROM YOUR SURGEON'S OFFICE!!!     Oral Hygiene is also important to reduce your risk of infection.                                    Remember - BRUSH YOUR TEETH THE MORNING OF SURGERY WITH YOUR REGULAR TOOTHPASTE  DENTURES WILL BE REMOVED PRIOR TO SURGERY PLEASE DO NOT APPLY "Poly grip" OR ADHESIVES!!!   Do NOT smoke after Midnight   Stop all vitamins and herbal supplements 7 days before surgery.   Take these medicines the morning of surgery with A SIP OF WATER: Carvedilol, Doxycycline, Finasteride, Hydralazine, Midodrine, Tamsulosin. You may take  Tylenol if needed for pain.  You may use your Albuterol inhaler if needed. Please bring this inhaler with you on the surgery day.                                You may not have any metal on your body including  jewelry, and body piercing             Do not wear lotions, powders, cologne, or deodorant              Men may shave face and neck.   Do not bring valuables to the hospital. [facility] IS NOT             RESPONSIBLE   FOR VALUABLES.   Contacts, glasses, dentures or bridgework may not be worn into surgery.  DO NOT BRING YOUR HOME MEDICATIONS TO THE HOSPITAL PHARMACY WILL DISPENSE MEDICATIONS LISTED ON YOUR MEDICATION LIST TO YOU DURING YOUR ADMISSION IN THE HOSPITAL!    Patients discharged on the day of surgery will not be allowed to drive home.  Someone NEEDS to stay with you for the first 24 hours after anesthesia.  Please read over the following fact sheets you were given: IF YOU HAVE QUESTIONS ABOUT YOUR PRE-OP INSTRUCTIONS PLEASE CALL [phone]   Joyce Gross, RN      [facility] - Preparing for Surgery Before surgery, you can play an important role.  Because skin is not sterile, your skin needs to be as free of germs as possible.  You can reduce the number of germs on your skin by washing with CHG (chlorahexidine gluconate) soap before surgery.  CHG is an antiseptic cleaner which kills germs and bonds with the skin to continue killing germs even after washing. Please DO NOT use if you have an allergy to CHG or antibacterial soaps.  If your skin becomes reddened/irritated stop using the CHG and inform your nurse when you arrive at Short Stay. Do not shave (including legs and underarms) for at least 48 hours prior to the first CHG shower.  You may shave your face/neck.  Please follow these instructions carefully:  1.  Shower with CHG Soap the night before surgery and the  morning of surgery.  2.  If you choose to wash your hair, wash your hair first as usual with  your normal  shampoo.  3.  After you shampoo, rinse your hair and body thoroughly to remove the shampoo.                             4.  Use CHG as you would any other liquid soap.  You can apply chg directly to the skin and wash.  Gently with a scrungie or clean washcloth.  5.  Apply the CHG Soap to your body ONLY FROM THE NECK DOWN.   Do   not use on face/ open                           Wound or open sores. Avoid contact with eyes, ears mouth and   genitals (private parts).                       Wash face,  Genitals (private parts) with your normal soap.             6.  Wash thoroughly, paying special attention to the area where your    surgery  will be performed.  7.  Thoroughly rinse your body with warm water from the neck down.  8.  DO NOT shower/wash with your normal soap after using and rinsing off the CHG Soap.                9.  Pat yourself dry with a clean towel.            10.  Wear clean pajamas.            11.  Place clean sheets on your bed the night of your first shower and do not  sleep with pets.  Day of Surgery : Do not apply any lotions/deodorants the morning of surgery.  Please wear clean clothes to the hospital/surgery center.  FAILURE TO FOLLOW THESE INSTRUCTIONS MAY RESULT IN THE CANCELLATION OF YOUR SURGERY  PATIENT SIGNATURE_________________________________  NURSE SIGNATURE__________________________________  ________________________________________________________________________

## 2023-09-20 NOTE — Progress Notes (Signed)
 COVID Vaccine Completed: yes  Date of COVID positive in last 90 days:  PCP - Catheryn Slocumb, MD Cardiologist - Annabella Scarce, MD LOV 03/19/22  Chest x-ray - 09/13/23 Epic EKG - 08/04/23 Epic Stress Test -  ECHO - 07/23/23 Epic Cardiac Cath -  Pacemaker/ICD device last checked: Spinal Cord Stimulator:  Bowel Prep -   Sleep Study -  CPAP -   Fasting Blood Sugar -  Checks Blood Sugar _____ times a day  Last dose of GLP1 agonist-  N/A GLP1 instructions:  Hold 7 days before surgery    Last dose of SGLT-2 inhibitors-  N/A SGLT-2 instructions:  Hold 3 days before surgery    Blood Thinner Instructions:  Time Aspirin  Instructions: Last Dose:  Activity level:  Can go up a flight of stairs and perform activities of daily living without stopping and without symptoms of chest pain or shortness of breath.  Able to exercise without symptoms  Unable to go up a flight of stairs without symptoms of     Anesthesia review: HTN, CHF, second degree AV block, dialysis, heart murmur  Patient denies shortness of breath, fever, cough and chest pain at PAT appointment  Patient verbalized understanding of instructions that were given to them at the PAT appointment. Patient was also instructed that they will need to review over the PAT instructions again at home before surgery.

## 2023-09-21 ENCOUNTER — Other Ambulatory Visit: Payer: Self-pay

## 2023-09-21 ENCOUNTER — Encounter (HOSPITAL_COMMUNITY): Payer: Self-pay

## 2023-09-21 ENCOUNTER — Encounter (HOSPITAL_COMMUNITY)
Admission: RE | Admit: 2023-09-21 | Discharge: 2023-09-21 | Disposition: A | Discharge status: Home / Self Care | Payer: Medicare PPO | Source: Ambulatory Visit | Attending: Urology | Admitting: Urology

## 2023-09-21 VITALS — BP 127/66 | HR 65 | Temp 98.8°F | Resp 22 | Ht 66.0 in | Wt 212.0 lb

## 2023-09-21 DIAGNOSIS — Z992 Dependence on renal dialysis: Secondary | ICD-10-CM | POA: Insufficient documentation

## 2023-09-21 DIAGNOSIS — N2889 Other specified disorders of kidney and ureter: Secondary | ICD-10-CM | POA: Diagnosis not present

## 2023-09-21 DIAGNOSIS — I2510 Atherosclerotic heart disease of native coronary artery without angina pectoris: Secondary | ICD-10-CM | POA: Diagnosis not present

## 2023-09-21 DIAGNOSIS — Z8679 Personal history of other diseases of the circulatory system: Secondary | ICD-10-CM | POA: Insufficient documentation

## 2023-09-21 DIAGNOSIS — D4101 Neoplasm of uncertain behavior of right kidney: Secondary | ICD-10-CM | POA: Diagnosis not present

## 2023-09-21 DIAGNOSIS — I5032 Chronic diastolic (congestive) heart failure: Secondary | ICD-10-CM | POA: Insufficient documentation

## 2023-09-21 DIAGNOSIS — N186 End stage renal disease: Secondary | ICD-10-CM | POA: Diagnosis not present

## 2023-09-21 DIAGNOSIS — I132 Hypertensive heart and chronic kidney disease with heart failure and with stage 5 chronic kidney disease, or end stage renal disease: Secondary | ICD-10-CM | POA: Diagnosis not present

## 2023-09-21 DIAGNOSIS — Z01812 Encounter for preprocedural laboratory examination: Secondary | ICD-10-CM | POA: Insufficient documentation

## 2023-09-21 DIAGNOSIS — N281 Cyst of kidney, acquired: Secondary | ICD-10-CM | POA: Diagnosis not present

## 2023-09-21 DIAGNOSIS — R590 Localized enlarged lymph nodes: Secondary | ICD-10-CM | POA: Diagnosis not present

## 2023-09-21 DIAGNOSIS — D494 Neoplasm of unspecified behavior of bladder: Secondary | ICD-10-CM | POA: Insufficient documentation

## 2023-09-21 HISTORY — DX: Pneumonia, unspecified organism: J18.9

## 2023-09-21 HISTORY — DX: Chronic kidney disease, unspecified: N18.9

## 2023-09-21 HISTORY — DX: Atrioventricular block, second degree: I44.1

## 2023-09-21 HISTORY — DX: Heart failure, unspecified: I50.9

## 2023-09-21 HISTORY — DX: Pleural effusion, not elsewhere classified: J90

## 2023-09-21 LAB — CBC
HCT: 28.6 % — ABNORMAL LOW (ref 39.0–52.0)
Hemoglobin: 8.9 g/dL — ABNORMAL LOW (ref 13.0–17.0)
MCH: 30 pg (ref 26.0–34.0)
MCHC: 31.1 g/dL (ref 30.0–36.0)
MCV: 96.3 fL (ref 80.0–100.0)
Platelets: 179 10*3/uL (ref 150–400)
RBC: 2.97 MIL/uL — ABNORMAL LOW (ref 4.22–5.81)
RDW: 15.2 % (ref 11.5–15.5)
WBC: 7.5 10*3/uL (ref 4.0–10.5)
nRBC: 0 % (ref 0.0–0.2)

## 2023-09-22 DIAGNOSIS — Z992 Dependence on renal dialysis: Secondary | ICD-10-CM | POA: Diagnosis not present

## 2023-09-22 DIAGNOSIS — N179 Acute kidney failure, unspecified: Secondary | ICD-10-CM | POA: Diagnosis not present

## 2023-09-22 DIAGNOSIS — N2581 Secondary hyperparathyroidism of renal origin: Secondary | ICD-10-CM | POA: Diagnosis not present

## 2023-09-22 NOTE — Progress Notes (Addendum)
 Case: [id number] Date/Time: 09/28/23 1015   Procedure: TRANSURETHRAL RESECTION OF BLADDER TUMOR (TURBT) , BILATERAL RETROGRADE PYELOGRAM - 60 MINUTE CASE   Anesthesia type: General   Pre-op diagnosis: BLADDER TUMOR   Location: [facility] PROCEDURE [facility] / [facility]   Surgeons: Carolee Sherwood [name], MD       DISCUSSION: Andrew Schmitt is a 73 yo male who presents to PAT prior to surgery above. 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  Patient had prolonged hospitalization from 07/21/23-08/17/23 for volume overload. He presented with SOB and weakness. 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 Also noted to have acute respiratory failure and pulmonary edema. Diuresis was attempted with Lasix  but this led to worsening kidney function and patient was started on dialysis by Nephrology on 12/20. He underwent thoracentesis for large left exudative pleural effusion on 12/10. Noted that there is some concern that this is a malignant effusion although cytology negative for malignant cells. Echo was repeated which showed EF 65-70%, no RWMA, moderate LVH and Cardiology felt that this was a primary pulmonary/renal issue. No concern for ACS. Patient also had hematuria with urinary retention, Urology consulted and patient is now scheduled for surgery above. He had 4 units of pRBC while inpatient due to hematuria/blood loss.  Patient had hospital f/u with PCP on 08/24/23. Reported frequent BMs but otherwise stable.  Patient follows with [facility] and is on TTS schedule.  He had UC visit on 09/13/23 for fever, cough, SOB. CXR consistent with chronic stable left pleural effusion and left atelectasis vs pneumonia. He was started on antibiotics. COVID/flu testing negative. Patent came to PAT visit and denied fever but  reported SOB and cough. BP, heart rate, and O2 sats normal. As this is an urgent case, patient will need eval DOS. Discussed with Dr. Mallory   VS: BP 127/66   Pulse 65   Temp 37.1 C (Oral)   Resp (!) 22   Ht 5' 6 (1.676 m)   Wt 96.2 kg   SpO2 100%   BMI 34.22 kg/m   PROVIDERS: Jarold Medici, MD Cardiologist - Annabella Scarce, MD  Nephrology- Bernardino Gasman, MD   LABS: Labs reviewed: Acceptable for surgery. Hgb stable from prior. BMP to be collected DOS (all labs ordered are listed, but only abnormal results are displayed)  Labs Reviewed  CBC - Abnormal; Notable for the following components:      Result Value   RBC 2.97 (*)    Hemoglobin 8.9 (*)    HCT 28.6 (*)    All other components within normal limits     IMAGES:  CXR 09/13/23:  IMPRESSION: Chronic left pleural effusion appears minimally enlarged compared with the most recent prior study. Associated left basilar pulmonary opacity could reflect atelectasis or pneumonia.  EKG 08/04/23:  Sinus rhythm with sinus arrhythmia with 1st degree A-V block  CV:  Echo 07/23/23:  IMPRESSIONS    1. Left ventricular ejection fraction, by estimation, is 65 to 70%. The left ventricle has normal function. The left ventricle has no regional wall motion abnormalities. There is moderate left ventricular hypertrophy. Left ventricular diastolic parameters are indeterminate.  2. Right ventricular systolic function is normal. The right ventricular size is normal.  3. Left atrial size was moderately dilated.  4. The  mitral valve is abnormal. Trivial mitral valve regurgitation. No evidence of mitral stenosis.  5. The aortic valve is tricuspid. There is mild calcification of the aortic valve. Aortic valve regurgitation is not visualized. Aortic valve sclerosis is present, with no evidence of aortic valve stenosis.  6. The inferior vena cava is normal in size with greater than 50% respiratory variability, suggesting right atrial  pressure of 3 mmHg.  CT Calcium  score 01/12/22:  IMPRESSION: 1. Coronary calcium  score of 132. This was 63rd percentile for age-, race-, and sex-matched controls.   2.  Dilated ascending aorta measuring 40mm   3.  Dilated main pulmonary artery measuring 32mm  Past Medical History:  Diagnosis Date   Ascending aortic aneurysm (HCC) 03/19/2022   AV block, 2nd degree    BPH (benign prostatic hyperplasia)    CHF (congestive heart failure) (HCC)    Chronic kidney disease    ESRD on HD at [facility]  via Right IJ   Chronic pleural effusion    Coronary artery calcification 03/19/2022   Erectile dysfunction    Gout    Heart murmur    Hyperlipidemia    Hypertension    Obesity    Pneumonia     Past Surgical History:  Procedure Laterality Date   CARDIAC SURGERY     drained fluid only   IR FLUORO GUIDE CV LINE RIGHT  08/04/2023   IR FLUORO GUIDE CV LINE RIGHT  08/09/2023   IR US  GUIDE VASC ACCESS RIGHT  08/04/2023   IR US  GUIDE VASC ACCESS RIGHT  08/11/2023   THORACENTESIS Left 07/26/2023   Procedure: THORACENTESIS;  Surgeon: Annella Donnice SAUNDERS, MD;  Location: [facility] ENDOSCOPY;  Service: Pulmonary;  Laterality: Left;    MEDICATIONS:  acetaminophen  (TYLENOL ) 500 MG tablet   albuterol  (VENTOLIN  HFA) 108 (90 Base) MCG/ACT inhaler   allopurinol  (ZYLOPRIM ) 100 MG tablet   benzonatate (TESSALON) 100 MG capsule   calcium  acetate (PHOSLO ) 667 MG capsule   carvedilol (COREG) 6.25 MG tablet   doxycycline (ADOXA) 100 MG tablet   finasteride  (PROSCAR ) 5 MG tablet   hydrALAZINE  (APRESOLINE ) 100 MG tablet   midodrine (PROAMATINE) 10 MG tablet   rosuvastatin  (CRESTOR ) 10 MG tablet   tamsulosin  (FLOMAX ) 0.4 MG CAPS capsule   torsemide  (DEMADEX ) 100 MG tablet   No current facility-administered medications for this encounter.   Burnard CHRISTELLA Odis DEVONNA [facility] Short Stay/Anesthesiology [facility] Phone [phone] 09/22/2023 10:43 AM

## 2023-09-22 NOTE — Anesthesia Preprocedure Evaluation (Addendum)
Anesthesia Evaluation  Patient identified by MRN, date of birth, ID band Patient awake    Reviewed: Allergy & Precautions, H&P , NPO status , Patient's Chart, lab work & pertinent test results  Airway Mallampati: II   Neck ROM: full    Dental   Pulmonary    breath sounds clear to auscultation       Cardiovascular hypertension, + CAD  + dysrhythmias  Rhythm:regular Rate:Normal  2nd degree heart block  TTE (07/2023): EF 65-70%, mild MR, moderate LVH   Neuro/Psych    GI/Hepatic   Endo/Other    Renal/GU ESRF and DialysisRenal disease     Musculoskeletal   Abdominal   Peds  Hematology   Anesthesia Other Findings   Reproductive/Obstetrics                             Anesthesia Physical Anesthesia Plan  ASA: 4  Anesthesia Plan: General   Post-op Pain Management:    Induction: Intravenous  PONV Risk Score and Plan: 2 and Ondansetron, Dexamethasone and Treatment may vary due to age or medical condition  Airway Management Planned: LMA  Additional Equipment:   Intra-op Plan:   Post-operative Plan: Extubation in OR  Informed Consent: I have reviewed the patients History and Physical, chart, labs and discussed the procedure including the risks, benefits and alternatives for the proposed anesthesia with the patient or authorized representative who has indicated his/her understanding and acceptance.     Dental advisory given  Plan Discussed with: CRNA, Anesthesiologist and Surgeon  Anesthesia Plan Comments: (See PAT note from 2/5 by K Gekas PA-C. Discussed with Dr. Tacy Dura due to recent pneumonia diagnosis )        Anesthesia Quick Evaluation

## 2023-09-23 DIAGNOSIS — N400 Enlarged prostate without lower urinary tract symptoms: Secondary | ICD-10-CM | POA: Diagnosis not present

## 2023-09-23 DIAGNOSIS — N179 Acute kidney failure, unspecified: Secondary | ICD-10-CM | POA: Diagnosis not present

## 2023-09-23 DIAGNOSIS — Z992 Dependence on renal dialysis: Secondary | ICD-10-CM | POA: Diagnosis not present

## 2023-09-23 DIAGNOSIS — R3914 Feeling of incomplete bladder emptying: Secondary | ICD-10-CM | POA: Diagnosis not present

## 2023-09-23 DIAGNOSIS — N2581 Secondary hyperparathyroidism of renal origin: Secondary | ICD-10-CM | POA: Diagnosis not present

## 2023-09-26 DIAGNOSIS — Z992 Dependence on renal dialysis: Secondary | ICD-10-CM | POA: Diagnosis not present

## 2023-09-26 DIAGNOSIS — N2581 Secondary hyperparathyroidism of renal origin: Secondary | ICD-10-CM | POA: Diagnosis not present

## 2023-09-26 DIAGNOSIS — N179 Acute kidney failure, unspecified: Secondary | ICD-10-CM | POA: Diagnosis not present

## 2023-09-27 ENCOUNTER — Encounter (HOSPITAL_COMMUNITY): Payer: Self-pay | Admitting: Urology

## 2023-09-27 ENCOUNTER — Other Ambulatory Visit (HOSPITAL_COMMUNITY): Payer: Self-pay

## 2023-09-27 DIAGNOSIS — N2581 Secondary hyperparathyroidism of renal origin: Secondary | ICD-10-CM | POA: Diagnosis not present

## 2023-09-27 DIAGNOSIS — Z992 Dependence on renal dialysis: Secondary | ICD-10-CM | POA: Diagnosis not present

## 2023-09-27 DIAGNOSIS — N179 Acute kidney failure, unspecified: Secondary | ICD-10-CM | POA: Diagnosis not present

## 2023-09-27 MED ORDER — CALCIUM ACETATE (PHOS BINDER) 667 MG PO CAPS: 667.0000 mg | ORAL_CAPSULE | Freq: Three times a day (TID) | ORAL | 3 refills | Status: AC

## 2023-09-28 ENCOUNTER — Ambulatory Visit (HOSPITAL_COMMUNITY): Payer: Medicare PPO | Admitting: Medical

## 2023-09-28 ENCOUNTER — Ambulatory Visit (HOSPITAL_COMMUNITY)
Admission: RE | Admit: 2023-09-28 | Discharge: 2023-09-28 | Disposition: A | Discharge status: Home / Self Care | Payer: Medicare PPO | Attending: Urology | Admitting: Urology

## 2023-09-28 ENCOUNTER — Ambulatory Visit (HOSPITAL_BASED_OUTPATIENT_CLINIC_OR_DEPARTMENT_OTHER): Payer: Medicare PPO | Admitting: Anesthesiology

## 2023-09-28 ENCOUNTER — Encounter (HOSPITAL_COMMUNITY): Payer: Self-pay | Admitting: Urology

## 2023-09-28 ENCOUNTER — Other Ambulatory Visit: Payer: Self-pay

## 2023-09-28 ENCOUNTER — Ambulatory Visit (HOSPITAL_COMMUNITY): Payer: Medicare PPO

## 2023-09-28 ENCOUNTER — Encounter (HOSPITAL_COMMUNITY)
Admission: RE | Disposition: A | Discharge status: Home / Self Care | Payer: Self-pay | Source: Home / Self Care | Attending: Urology

## 2023-09-28 DIAGNOSIS — Z992 Dependence on renal dialysis: Secondary | ICD-10-CM | POA: Insufficient documentation

## 2023-09-28 DIAGNOSIS — I120 Hypertensive chronic kidney disease with stage 5 chronic kidney disease or end stage renal disease: Secondary | ICD-10-CM

## 2023-09-28 DIAGNOSIS — R310 Gross hematuria: Secondary | ICD-10-CM | POA: Diagnosis not present

## 2023-09-28 DIAGNOSIS — N186 End stage renal disease: Secondary | ICD-10-CM | POA: Diagnosis not present

## 2023-09-28 DIAGNOSIS — I132 Hypertensive heart and chronic kidney disease with heart failure and with stage 5 chronic kidney disease, or end stage renal disease: Secondary | ICD-10-CM

## 2023-09-28 DIAGNOSIS — I2510 Atherosclerotic heart disease of native coronary artery without angina pectoris: Secondary | ICD-10-CM | POA: Insufficient documentation

## 2023-09-28 DIAGNOSIS — I5032 Chronic diastolic (congestive) heart failure: Secondary | ICD-10-CM | POA: Diagnosis not present

## 2023-09-28 DIAGNOSIS — N184 Chronic kidney disease, stage 4 (severe): Secondary | ICD-10-CM | POA: Diagnosis not present

## 2023-09-28 DIAGNOSIS — D494 Neoplasm of unspecified behavior of bladder: Secondary | ICD-10-CM

## 2023-09-28 DIAGNOSIS — I441 Atrioventricular block, second degree: Secondary | ICD-10-CM | POA: Insufficient documentation

## 2023-09-28 DIAGNOSIS — D303 Benign neoplasm of bladder: Secondary | ICD-10-CM | POA: Diagnosis not present

## 2023-09-28 DIAGNOSIS — N529 Male erectile dysfunction, unspecified: Secondary | ICD-10-CM | POA: Diagnosis not present

## 2023-09-28 DIAGNOSIS — N401 Enlarged prostate with lower urinary tract symptoms: Secondary | ICD-10-CM | POA: Insufficient documentation

## 2023-09-28 DIAGNOSIS — I130 Hypertensive heart and chronic kidney disease with heart failure and stage 1 through stage 4 chronic kidney disease, or unspecified chronic kidney disease: Secondary | ICD-10-CM | POA: Diagnosis not present

## 2023-09-28 DIAGNOSIS — N3020 Other chronic cystitis without hematuria: Secondary | ICD-10-CM | POA: Diagnosis not present

## 2023-09-28 HISTORY — PX: TRANSURETHRAL RESECTION OF BLADDER TUMOR: SHX2575

## 2023-09-28 LAB — CBC
HCT: 29 % — ABNORMAL LOW (ref 39.0–52.0)
Hemoglobin: 8.4 g/dL — ABNORMAL LOW (ref 13.0–17.0)
MCH: 29.5 pg (ref 26.0–34.0)
MCHC: 29 g/dL — ABNORMAL LOW (ref 30.0–36.0)
MCV: 101.8 fL — ABNORMAL HIGH (ref 80.0–100.0)
Platelets: 152 10*3/uL (ref 150–400)
RBC: 2.85 MIL/uL — ABNORMAL LOW (ref 4.22–5.81)
RDW: 16.6 % — ABNORMAL HIGH (ref 11.5–15.5)
WBC: 4.2 10*3/uL (ref 4.0–10.5)
nRBC: 0 % (ref 0.0–0.2)

## 2023-09-28 LAB — BASIC METABOLIC PANEL
Anion gap: 11 (ref 5–15)
BUN: 23 mg/dL (ref 8–23)
CO2: 25 mmol/L (ref 22–32)
Calcium: 8.6 mg/dL — ABNORMAL LOW (ref 8.9–10.3)
Chloride: 103 mmol/L (ref 98–111)
Creatinine, Ser: 3.08 mg/dL — ABNORMAL HIGH (ref 0.61–1.24)
GFR, Estimated: 20 mL/min — ABNORMAL LOW (ref >60)
Glucose, Bld: 95 mg/dL (ref 70–99)
Potassium: 3.3 mmol/L — ABNORMAL LOW (ref 3.5–5.1)
Sodium: 139 mmol/L (ref 135–145)

## 2023-09-28 SURGERY — TURBT (TRANSURETHRAL RESECTION OF BLADDER TUMOR)
Anesthesia: General

## 2023-09-28 MED ORDER — PROPOFOL 10 MG/ML IV BOLUS
INTRAVENOUS | Status: AC
  Filled 2023-09-28: qty 20

## 2023-09-28 MED ORDER — PHENYLEPHRINE HCL (PRESSORS) 10 MG/ML IV SOLN
INTRAVENOUS | Status: DC | PRN
  Administered 2023-09-28 (×2): 80 ug via INTRAVENOUS

## 2023-09-28 MED ORDER — FENTANYL CITRATE PF 50 MCG/ML IJ SOSY: 25.0000 ug | PREFILLED_SYRINGE | INTRAMUSCULAR | Status: DC | PRN

## 2023-09-28 MED ORDER — EPHEDRINE 5 MG/ML INJ
INTRAVENOUS | Status: AC
  Filled 2023-09-28: qty 5

## 2023-09-28 MED ORDER — ROCURONIUM BROMIDE 10 MG/ML (PF) SYRINGE
PREFILLED_SYRINGE | INTRAVENOUS | Status: AC
  Filled 2023-09-28: qty 10

## 2023-09-28 MED ORDER — LIDOCAINE HCL (CARDIAC) PF 100 MG/5ML IV SOSY
PREFILLED_SYRINGE | INTRAVENOUS | Status: DC | PRN
  Administered 2023-09-28: 40 mg via INTRAVENOUS

## 2023-09-28 MED ORDER — ROCURONIUM BROMIDE 100 MG/10ML IV SOLN
INTRAVENOUS | Status: DC | PRN
  Administered 2023-09-28: 50 mg via INTRAVENOUS

## 2023-09-28 MED ORDER — DEXAMETHASONE SODIUM PHOSPHATE 4 MG/ML IJ SOLN
INTRAMUSCULAR | Status: DC | PRN
  Administered 2023-09-28: 8 mg via INTRAVENOUS

## 2023-09-28 MED ORDER — SODIUM CHLORIDE 0.9 % IV SOLN: INTRAVENOUS | Status: DC | PRN

## 2023-09-28 MED ORDER — EPHEDRINE SULFATE (PRESSORS) 50 MG/ML IJ SOLN
INTRAMUSCULAR | Status: DC | PRN
  Administered 2023-09-28: 15 mg via INTRAVENOUS
  Administered 2023-09-28: 10 mg via INTRAVENOUS

## 2023-09-28 MED ORDER — FENTANYL CITRATE (PF) 100 MCG/2ML IJ SOLN
INTRAMUSCULAR | Status: DC | PRN
  Administered 2023-09-28: 50 ug via INTRAVENOUS

## 2023-09-28 MED ORDER — DEXAMETHASONE SODIUM PHOSPHATE 10 MG/ML IJ SOLN
INTRAMUSCULAR | Status: AC
  Filled 2023-09-28: qty 1

## 2023-09-28 MED ORDER — OXYCODONE HCL 5 MG/5ML PO SOLN: 5.0000 mg | Freq: Once | ORAL | Status: DC | PRN

## 2023-09-28 MED ORDER — CHLORHEXIDINE GLUCONATE 0.12 % MT SOLN
15.0000 mL | Freq: Once | OROMUCOSAL | Status: AC
  Administered 2023-09-28: 15 mL via OROMUCOSAL

## 2023-09-28 MED ORDER — PROPOFOL 10 MG/ML IV BOLUS
INTRAVENOUS | Status: DC | PRN
  Administered 2023-09-28: 110 mg via INTRAVENOUS

## 2023-09-28 MED ORDER — ONDANSETRON HCL 4 MG/2ML IJ SOLN: 4.0000 mg | Freq: Four times a day (QID) | INTRAMUSCULAR | Status: DC | PRN

## 2023-09-28 MED ORDER — PHENYLEPHRINE 80 MCG/ML (10ML) SYRINGE FOR IV PUSH (FOR BLOOD PRESSURE SUPPORT)
PREFILLED_SYRINGE | INTRAVENOUS | Status: AC
  Filled 2023-09-28: qty 10

## 2023-09-28 MED ORDER — CEFAZOLIN SODIUM-DEXTROSE 2-4 GM/100ML-% IV SOLN
2.0000 g | INTRAVENOUS | Status: AC
  Administered 2023-09-28: 2 g via INTRAVENOUS
  Filled 2023-09-28: qty 100

## 2023-09-28 MED ORDER — PHENYLEPHRINE HCL-NACL 20-0.9 MG/250ML-% IV SOLN
INTRAVENOUS | Status: AC
  Filled 2023-09-28: qty 500

## 2023-09-28 MED ORDER — OXYCODONE HCL 5 MG PO TABS: 5.0000 mg | ORAL_TABLET | Freq: Once | ORAL | Status: DC | PRN

## 2023-09-28 MED ORDER — ONDANSETRON HCL 4 MG/2ML IJ SOLN
INTRAMUSCULAR | Status: DC | PRN
  Administered 2023-09-28: 4 mg via INTRAVENOUS

## 2023-09-28 MED ORDER — LIDOCAINE HCL (PF) 2 % IJ SOLN
INTRAMUSCULAR | Status: AC
  Filled 2023-09-28: qty 5

## 2023-09-28 MED ORDER — ONDANSETRON HCL 4 MG/2ML IJ SOLN
INTRAMUSCULAR | Status: AC
  Filled 2023-09-28: qty 2

## 2023-09-28 MED ORDER — SUGAMMADEX SODIUM 200 MG/2ML IV SOLN
INTRAVENOUS | Status: DC | PRN
  Administered 2023-09-28: 200 mg via INTRAVENOUS

## 2023-09-28 MED ORDER — LACTATED RINGERS IV SOLN: INTRAVENOUS | Status: DC | PRN

## 2023-09-28 MED ORDER — SODIUM CHLORIDE 0.9 % IR SOLN
Status: DC | PRN
  Administered 2023-09-28 (×2): 3000 mL

## 2023-09-28 MED ORDER — LACTATED RINGERS IV SOLN: INTRAVENOUS | Status: DC

## 2023-09-28 MED ORDER — ORAL CARE MOUTH RINSE: 15.0000 mL | Freq: Once | OROMUCOSAL | Status: AC

## 2023-09-28 MED ORDER — FENTANYL CITRATE (PF) 100 MCG/2ML IJ SOLN
INTRAMUSCULAR | Status: AC
  Filled 2023-09-28: qty 2

## 2023-09-28 SURGICAL SUPPLY — 17 items
BAG URINE DRAIN 2000ML AR STRL (UROLOGICAL SUPPLIES) IMPLANT
BAG URO CATCHER STRL LF (MISCELLANEOUS) ×1 IMPLANT
CATH FOLEY 2WAY SLVR 5CC 18FR (CATHETERS) IMPLANT
CATH URETL OPEN 5X70 (CATHETERS) IMPLANT
DRAPE FOOT SWITCH (DRAPES) ×1 IMPLANT
ELECT REM PT RETURN 15FT ADLT (MISCELLANEOUS) ×1 IMPLANT
GLOVE BIO SURGEON STRL SZ7.5 (GLOVE) ×1 IMPLANT
GOWN STRL REUS W/ TWL XL LVL3 (GOWN DISPOSABLE) ×1 IMPLANT
KIT TURNOVER KIT A (KITS) IMPLANT
LOOP CUT BIPOLAR 24F LRG (ELECTROSURGICAL) IMPLANT
MANIFOLD NEPTUNE II (INSTRUMENTS) ×1 IMPLANT
PACK CYSTO (CUSTOM PROCEDURE TRAY) ×1 IMPLANT
PLUG CATH AND CAP STRL 200 (CATHETERS) IMPLANT
SYR TOOMEY IRRIG 70ML (MISCELLANEOUS)
SYRINGE TOOMEY IRRIG 70ML (MISCELLANEOUS) IMPLANT
TUBING CONNECTING 10 (TUBING) ×1 IMPLANT
TUBING UROLOGY SET (TUBING) ×1 IMPLANT

## 2023-09-28 NOTE — Discharge Instructions (Signed)

## 2023-09-28 NOTE — Transfer of Care (Signed)
Immediate Anesthesia Transfer of Care Note  Patient: Andrew Schmitt  Procedure(s) Performed: TRANSURETHRAL RESECTION OF BLADDER TUMOR (TURBT) , BILATERAL RETROGRADE PYELOGRAM  Patient Location: PACU  Anesthesia Type:General  Level of Consciousness: awake, alert , and oriented  Airway & Oxygen Therapy: Patient Spontanous Breathing and Patient connected to face mask oxygen  Post-op Assessment: Report given to RN and Post -op Vital signs reviewed and stable  Post vital signs: Reviewed and stable  Last Vitals:  Vitals Value Taken Time  BP 101/58 09/28/23 1138  Temp    Pulse 65 09/28/23 1142  Resp 20 09/28/23 1142  SpO2 100 % 09/28/23 1142  Vitals shown include unfiled device data.  Last Pain:  Vitals:   09/28/23 0850  TempSrc: Oral         Complications: No notable events documented.

## 2023-09-28 NOTE — Progress Notes (Signed)
Pt states he had dialysis yesterday (09/27/23) in [place].

## 2023-09-28 NOTE — Anesthesia Procedure Notes (Addendum)
Procedure Name: Intubation Date/Time: 09/28/2023 10:56 AM  Performed by: Cleda Clarks, CRNAPre-anesthesia Checklist: Patient identified, Emergency Drugs available, Suction available and Patient being monitored Patient Re-evaluated:Patient Re-evaluated prior to induction Oxygen Delivery Method: Circle system utilized Preoxygenation: Pre-oxygenation with 100% oxygen Induction Type: IV induction Ventilation: Mask ventilation without difficulty Laryngoscope Size: Glidescope and 4 Tube type: Oral Tube size: 7.0 mm Number of attempts: 1 Airway Equipment and Method: Stylet, Oral airway and Video-laryngoscopy Placement Confirmation: ETT inserted through vocal cords under direct vision, positive ETCO2 and breath sounds checked- equal and bilateral Secured at: 24 cm Tube secured with: Tape Dental Injury: Teeth and Oropharynx as per pre-operative assessment

## 2023-09-28 NOTE — Op Note (Signed)
Operative Note  Preoperative diagnosis:  1.  Bladder tumor  Postoperative diagnosis: 1.  Bladder tumor  Procedure(s): 1.  Cystoscopy with bilateral retrograde pyelogram 2.  Transurethral resection of bladder tumor- medium  Surgeon: Modena Slater, MD  Assistants: None  Anesthesia: General  Complications: None immediate  EBL: Minimal  Specimens: 1.  Bladder tumor  Drains/Catheters: 1.  None  Intraoperative findings: 1.  Normal anterior urethra 2.  Moderately obstructing prostate with bilobar hypertrophy. 3.  Bladder mucosa with several areas of slightly raised erythema.  Several of these were biopsied and all or fulgurated.  Largest area was a little over 2 cm.  4.  Bilateral retrograde pyelogram without any filling defect or hydronephrosis.  Ureters were little bit tortuous.  Indication: 73 year old male with gross hematuria and a bladder lesion presents for the previously mentioned operation.  Description of procedure:  The patient was identified and consent was obtained.  The patient was taken to the operating room and placed in the supine position.  The patient was placed under general anesthesia.  Perioperative antibiotics were administered.  The patient was placed in dorsal lithotomy.  Patient was prepped and draped in a standard sterile fashion and a timeout was performed.  A 21 French rigid cystoscope was advanced into the urethra and into the bladder.  Complete cystoscopy was performed with findings noted above.  The left ureter was cannulated with an open-ended ureteral catheter and a retrograde pyelogram was performed with no abnormal findings.  Same was performed on the right again with no abnormal findings.  I withdrew the scope.  I advanced a 28 French resectoscope with visual obturator in place into the urethra and into the bladder.  I exchanged for the bipolar working element and proceeded to resect several abnormal areas followed by fulguration of the resection  beds.  Several other slightly abnormal areas were fulgurated.  Specimen was collected.  I reinspected bladder mucosa and there was no evidence of any active bleeding.  No evidence of perforation.  I withdrew the scope and this concluded the operation.  Patient tolerated the procedure well and was stable postoperatively.  Plan: Follow-up in 1 week for pathology review

## 2023-09-28 NOTE — H&P (Signed)
CC/HPI: CC: History of BPH, erectile dysfunction  HPI:  04/27/2023  73 year old male with history of BPH managed with Flomax and finasteride. He is doing well in this regard with no significant voiding complaints. His has nocturia x 2 that is not bothersome. He does have erectile dysfunction that responded to sildenafil in the past but no longer works well for him at the maximum dosage. Would like to try Trimix. No interest in penile prosthetic at this point.   08/26/2023  Patient was recently admitted to the hospital and experienced gross hematuria and was started on CBI. CT of the abdomen and pelvis without contrast on 08/03/2023 showed no hydronephrosis or nephrolithiasis. He had moderate amount of blood clots in the bladder and a 3 cm indeterminate high attenuation lesion in the interpolar right kidney for which further evaluation was recommended. He has not had a cystoscopy. He presents for that.   09/23/2023: The patient is scheduled for cystoscopy with possible TURBT and bilateral retrograde pyelograms with Dr. Alvester Morin on 2/12. He continues on a 4 day/week dialysis schedule for history of end-stage renal disease. Presents today with concerns for possible retention. He states he has not really voided any in the last 2 days. Symptoms are not associated with any lower abdominal pain/discomfort. He denies any bladder urgency or pressure. He has not had any recent dysuria or gross hematuria. PVR today quite reassuring and a little over 2 ounces.     ALLERGIES: No Allergies    MEDICATIONS: Finasteride 5 mg tablet 1 tablet PO Daily  Tamsulosin Hcl 0.4 mg capsule 1 capsule PO Daily  Allopurinol 300 MG Oral Tablet Oral  AmLODIPine Besylate 10 MG Oral Tablet Oral  Aspir 81  Carvedilol 6.25 mg tablet  Chlorthalidone 50 mg tablet  Garlic CAPS Oral  Gas Relief  Hydralazine Hcl 100 mg tablet  Lasix 20 MG Oral Tablet Oral  Rosuvastatin Calcium 10 mg tablet  Sildenafil Citrate 20 MG Oral Tablet TAke 2-5 as  directed  Stool Softener  Vitamin B-12  Vitamin D3     GU PSH: Cystoscopy - 08/26/2023       PSH Notes: Heart Surgery   NON-GU PSH: Colonoscopy - about Oct 10, 2015     GU PMH: 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/2023, - 08/26/2023 Gross hematuria - 08/26/2023 BPH w/o LUTS - 04/27/2023, Benign enlargement of prostate, - October 09, 2013 ED due to arterial insufficiency - 04/27/2023, 10/09/20, 09-Oct-2018, 10/10/2015, Erectile dysfunction due to arterial insufficiency, - 10-09-14, Erectile dysfunction due to arterial insufficiency, - 2015 BPH w/LUTS - 09-Oct-2020, 2018-10-09, 10/09/17, 10/10/15 Urinary Hesitancy - 10/09/20, - October 10, 2019 Weak Urinary Stream - Oct 10, 2019 Nocturia - 2018/10/09, - 10/09/2017 (Stable), - 2016/10/09, Nocturia, - Oct 09, 2014 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, Hypogonadism, testicular, - Oct 09, 2014 Elevated PSA, Elevated prostate specific antigen (PSA) - 10-09-2014    NON-GU PMH: Encounter for general adult medical examination without abnormal findings, Encounter for preventive health examination - Oct 09, 2014 Asthma, Asthma - 10/09/2012 Cardiac murmur, unspecified, Murmurs - 10/09/12 Gout, Gout - Oct 09, 2012 Personal history of other diseases of the circulatory system, History of hypertension - 09-Oct-2012 Personal history of other diseases of the digestive system, History of esophageal reflux - 10/09/2012 Personal history of other diseases of the nervous system and sense organs, History of sleep apnea - Oct 09, 2012 Personal history of other endocrine, nutritional and metabolic disease, History of hypercholesterolemia - 2012-10-09 Personal history of other specified conditions, History of heartburn - 10-09-2012    FAMILY HISTORY: Death In The Family  Father - Father Death In The Family Mother - Mother Family Health Status Number - Runs In Family No pertinent family history - Other   SOCIAL HISTORY: Marital Status: Married Preferred Language: English; Ethnicity: Not Hispanic Or Latino; Race: Black or African American Current Smoking Status: Patient has never smoked.  Does not drink anymore.  Drinks 4+  caffeinated drinks per day. Patient's occupation is/was Retired.     Notes: Never A Smoker, Alcohol Use, Tobacco Use, Occupation:, Caffeine Use, Marital History - Currently Married   REVIEW OF SYSTEMS:    GU Review Male:   Patient denies frequent urination, hard to postpone urination, burning/ pain with urination, get up at night to urinate, leakage of urine, stream starts and stops, trouble starting your stream, have to strain to urinate , erection problems, and penile pain.  Gastrointestinal (Upper):   Patient denies nausea, vomiting, and indigestion/ heartburn.  Gastrointestinal (Lower):   Patient denies diarrhea and constipation.  Constitutional:   Patient denies fever, night sweats, weight loss, and fatigue.  Skin:   Patient denies skin rash/ lesion and itching.  Eyes:   Patient denies blurred vision and double vision.  Ears/ Nose/ Throat:   Patient denies sore throat and sinus problems.  Hematologic/Lymphatic:   Patient denies swollen glands and easy bruising.  Cardiovascular:   Patient denies leg swelling and chest pains.  Respiratory:   Patient denies cough and shortness of breath.  Endocrine:   Patient denies excessive thirst.  Musculoskeletal:   Patient denies back pain and joint pain.  Neurological:   Patient denies headaches and dizziness.  Psychologic:   Patient denies depression and anxiety.   Notes: His daughter was present via telephone for the entirety of today's visit.    VITAL SIGNS:      09/23/2023 02:07 PM  Weight 213 lb / 96.62 kg  Height 66 in / 167.64 cm  BP 120/81 mmHg  Heart Rate 69 /min  BMI 34.4 kg/m   MULTI-SYSTEM PHYSICAL EXAMINATION:    Constitutional: Well-nourished. No physical deformities. Normally developed. Good grooming.  Neck: Neck symmetrical, not swollen. Normal tracheal position.  Respiratory: No labored breathing, no use of accessory muscles.   Skin: No paleness, no jaundice, no cyanosis. No lesion, no ulcer, no rash.  Neurologic /  Psychiatric: Oriented to time, oriented to place, oriented to person. No depression, no anxiety, no agitation.  Gastrointestinal: No mass, no tenderness, no rigidity, non obese abdomen.  Musculoskeletal: Normal gait and station of head and neck.     Complexity of Data:  Source Of History:  Patient, Family/Caregiver, Medical Record Summary  Records Review:   Previous Doctor Records, Previous Hospital Records, Previous Patient Records  Urodynamics Review:   Review Bladder Scan   03/23/17 03/22/16 08/16/15 02/04/15 07/26/14 01/22/14 10/15/13 01/18/13  PSA  Total PSA 1.78 ng/mL 2.34  3.05  3.00  2.78  3.95  3.11  4.27     03/23/17 09/22/16 03/22/16 08/16/15 02/04/15 07/26/14 01/22/14 10/16/13  Hormones  Testosterone, Total 315.5 ng/dL 161.0 pg/dL 960.4 pg/dL 540  981  191  478  295     PROCEDURES:         PVR Ultrasound - 62130  Scanned Volume: 63 cc   ASSESSMENT:      ICD-10 Details  1 GU:   Incomplete bladder emptying - R39.14 Chronic, Stable  2   BPH w/o LUTS - N40.0 Chronic, Stable   PLAN:           Schedule Return Visit/Planned  Activity: Keep Scheduled Appointment - Follow up MD          Document Letter(s):  Created for Patient: Clinical Summary         Notes:   Patient without bothersome frequency/urgency, he has not had any recent dysuria or gross hematuria. PVR today is quite reassuring. He is on a fairly aggressive dialysis schedule which may be causing him to be anuric. No clinical indication to place a Foley catheter at this time. He may continue tamsulosin and finasteride as previously prescribed. Should he develop worsening symptoms of obstructive uropathy over the weekend, I did recommend ED follow-up for additional evaluation. He has follow-up for cystoscopy, possible TURBT and bilateral retrograde pyelograms with Dr. Alvester Morin on 2/12. He will keep that appointment.        Next Appointment:      Next Appointment: 09/28/2023 10:30 AM    Appointment Type: Surgery      Location: [facility] [phone]    Provider: Modena Slater, III, M.D.    Reason for Visit: OP--WL--TURBT--BIL RGP

## 2023-09-29 ENCOUNTER — Encounter (HOSPITAL_COMMUNITY): Payer: Self-pay | Admitting: Urology

## 2023-09-29 DIAGNOSIS — N179 Acute kidney failure, unspecified: Secondary | ICD-10-CM | POA: Diagnosis not present

## 2023-09-29 DIAGNOSIS — Z992 Dependence on renal dialysis: Secondary | ICD-10-CM | POA: Diagnosis not present

## 2023-09-29 DIAGNOSIS — N2581 Secondary hyperparathyroidism of renal origin: Secondary | ICD-10-CM | POA: Diagnosis not present

## 2023-09-29 LAB — SURGICAL PATHOLOGY

## 2023-09-29 NOTE — Anesthesia Postprocedure Evaluation (Signed)
Anesthesia Post Note  Patient: Andrew Schmitt  Procedure(s) Performed: TRANSURETHRAL RESECTION OF BLADDER TUMOR (TURBT) , BILATERAL RETROGRADE PYELOGRAM     Patient location during evaluation: PACU Anesthesia Type: General Level of consciousness: awake and alert Pain management: pain level controlled Vital Signs Assessment: post-procedure vital signs reviewed and stable Respiratory status: spontaneous breathing, nonlabored ventilation, respiratory function stable and patient connected to nasal cannula oxygen Cardiovascular status: blood pressure returned to baseline and stable Postop Assessment: no apparent nausea or vomiting Anesthetic complications: no   No notable events documented.  Last Vitals:  Vitals:   09/28/23 1300 09/28/23 1315  BP: 109/66 116/69  Pulse: 74 74  Resp:    Temp:    SpO2: 96% 96%    Last Pain:  Vitals:   09/28/23 1253  TempSrc: Oral  PainSc: 0-No pain                 [name]

## 2023-09-30 DIAGNOSIS — N179 Acute kidney failure, unspecified: Secondary | ICD-10-CM | POA: Diagnosis not present

## 2023-09-30 DIAGNOSIS — Z992 Dependence on renal dialysis: Secondary | ICD-10-CM | POA: Diagnosis not present

## 2023-09-30 DIAGNOSIS — N2581 Secondary hyperparathyroidism of renal origin: Secondary | ICD-10-CM | POA: Diagnosis not present

## 2023-10-01 ENCOUNTER — Other Ambulatory Visit (HOSPITAL_COMMUNITY): Payer: Self-pay

## 2023-10-03 DIAGNOSIS — N2581 Secondary hyperparathyroidism of renal origin: Secondary | ICD-10-CM | POA: Diagnosis not present

## 2023-10-03 DIAGNOSIS — Z992 Dependence on renal dialysis: Secondary | ICD-10-CM | POA: Diagnosis not present

## 2023-10-03 DIAGNOSIS — N179 Acute kidney failure, unspecified: Secondary | ICD-10-CM | POA: Diagnosis not present

## 2023-10-04 DIAGNOSIS — N2581 Secondary hyperparathyroidism of renal origin: Secondary | ICD-10-CM | POA: Diagnosis not present

## 2023-10-04 DIAGNOSIS — N179 Acute kidney failure, unspecified: Secondary | ICD-10-CM | POA: Diagnosis not present

## 2023-10-04 DIAGNOSIS — Z992 Dependence on renal dialysis: Secondary | ICD-10-CM | POA: Diagnosis not present

## 2023-10-05 DIAGNOSIS — D3500 Benign neoplasm of unspecified adrenal gland: Secondary | ICD-10-CM | POA: Diagnosis not present

## 2023-10-05 DIAGNOSIS — R310 Gross hematuria: Secondary | ICD-10-CM | POA: Diagnosis not present

## 2023-10-05 DIAGNOSIS — R3914 Feeling of incomplete bladder emptying: Secondary | ICD-10-CM | POA: Diagnosis not present

## 2023-10-06 DIAGNOSIS — N2581 Secondary hyperparathyroidism of renal origin: Secondary | ICD-10-CM | POA: Diagnosis not present

## 2023-10-06 DIAGNOSIS — Z992 Dependence on renal dialysis: Secondary | ICD-10-CM | POA: Diagnosis not present

## 2023-10-06 DIAGNOSIS — N179 Acute kidney failure, unspecified: Secondary | ICD-10-CM | POA: Diagnosis not present

## 2023-10-07 DIAGNOSIS — N179 Acute kidney failure, unspecified: Secondary | ICD-10-CM | POA: Diagnosis not present

## 2023-10-07 DIAGNOSIS — Z992 Dependence on renal dialysis: Secondary | ICD-10-CM | POA: Diagnosis not present

## 2023-10-07 DIAGNOSIS — N2581 Secondary hyperparathyroidism of renal origin: Secondary | ICD-10-CM | POA: Diagnosis not present

## 2023-10-10 DIAGNOSIS — Z992 Dependence on renal dialysis: Secondary | ICD-10-CM | POA: Diagnosis not present

## 2023-10-10 DIAGNOSIS — N179 Acute kidney failure, unspecified: Secondary | ICD-10-CM | POA: Diagnosis not present

## 2023-10-10 DIAGNOSIS — N2581 Secondary hyperparathyroidism of renal origin: Secondary | ICD-10-CM | POA: Diagnosis not present

## 2023-10-11 DIAGNOSIS — N2581 Secondary hyperparathyroidism of renal origin: Secondary | ICD-10-CM | POA: Diagnosis not present

## 2023-10-11 DIAGNOSIS — Z992 Dependence on renal dialysis: Secondary | ICD-10-CM | POA: Diagnosis not present

## 2023-10-11 DIAGNOSIS — N179 Acute kidney failure, unspecified: Secondary | ICD-10-CM | POA: Diagnosis not present

## 2023-10-13 DIAGNOSIS — E66812 Obesity, class 2: Secondary | ICD-10-CM | POA: Diagnosis not present

## 2023-10-13 DIAGNOSIS — N1832 Chronic kidney disease, stage 3b: Secondary | ICD-10-CM | POA: Diagnosis not present

## 2023-10-13 DIAGNOSIS — I5031 Acute diastolic (congestive) heart failure: Secondary | ICD-10-CM | POA: Diagnosis not present

## 2023-10-13 DIAGNOSIS — J9601 Acute respiratory failure with hypoxia: Secondary | ICD-10-CM | POA: Diagnosis not present

## 2023-10-13 DIAGNOSIS — N400 Enlarged prostate without lower urinary tract symptoms: Secondary | ICD-10-CM | POA: Diagnosis not present

## 2023-10-17 DIAGNOSIS — N2581 Secondary hyperparathyroidism of renal origin: Secondary | ICD-10-CM | POA: Diagnosis not present

## 2023-10-17 DIAGNOSIS — N179 Acute kidney failure, unspecified: Secondary | ICD-10-CM | POA: Diagnosis not present

## 2023-10-17 DIAGNOSIS — Z992 Dependence on renal dialysis: Secondary | ICD-10-CM | POA: Diagnosis not present

## 2023-10-18 DIAGNOSIS — N2581 Secondary hyperparathyroidism of renal origin: Secondary | ICD-10-CM | POA: Diagnosis not present

## 2023-10-18 DIAGNOSIS — N179 Acute kidney failure, unspecified: Secondary | ICD-10-CM | POA: Diagnosis not present

## 2023-10-18 DIAGNOSIS — Z992 Dependence on renal dialysis: Secondary | ICD-10-CM | POA: Diagnosis not present

## 2023-10-20 DIAGNOSIS — N2581 Secondary hyperparathyroidism of renal origin: Secondary | ICD-10-CM | POA: Diagnosis not present

## 2023-10-20 DIAGNOSIS — Z992 Dependence on renal dialysis: Secondary | ICD-10-CM | POA: Diagnosis not present

## 2023-10-20 DIAGNOSIS — N179 Acute kidney failure, unspecified: Secondary | ICD-10-CM | POA: Diagnosis not present

## 2023-10-21 DIAGNOSIS — Z992 Dependence on renal dialysis: Secondary | ICD-10-CM | POA: Diagnosis not present

## 2023-10-21 DIAGNOSIS — N179 Acute kidney failure, unspecified: Secondary | ICD-10-CM | POA: Diagnosis not present

## 2023-10-21 DIAGNOSIS — N2581 Secondary hyperparathyroidism of renal origin: Secondary | ICD-10-CM | POA: Diagnosis not present

## 2023-10-24 DIAGNOSIS — N179 Acute kidney failure, unspecified: Secondary | ICD-10-CM | POA: Diagnosis not present

## 2023-10-24 DIAGNOSIS — N2581 Secondary hyperparathyroidism of renal origin: Secondary | ICD-10-CM | POA: Diagnosis not present

## 2023-10-24 DIAGNOSIS — Z992 Dependence on renal dialysis: Secondary | ICD-10-CM | POA: Diagnosis not present

## 2023-10-25 DIAGNOSIS — Z992 Dependence on renal dialysis: Secondary | ICD-10-CM | POA: Diagnosis not present

## 2023-10-25 DIAGNOSIS — N179 Acute kidney failure, unspecified: Secondary | ICD-10-CM | POA: Diagnosis not present

## 2023-10-25 DIAGNOSIS — N2581 Secondary hyperparathyroidism of renal origin: Secondary | ICD-10-CM | POA: Diagnosis not present

## 2023-10-26 DIAGNOSIS — R197 Diarrhea, unspecified: Secondary | ICD-10-CM | POA: Diagnosis not present

## 2023-10-26 DIAGNOSIS — Z8679 Personal history of other diseases of the circulatory system: Secondary | ICD-10-CM | POA: Diagnosis not present

## 2023-10-26 DIAGNOSIS — Z860101 Personal history of adenomatous and serrated colon polyps: Secondary | ICD-10-CM | POA: Diagnosis not present

## 2023-10-26 DIAGNOSIS — N186 End stage renal disease: Secondary | ICD-10-CM | POA: Diagnosis not present

## 2023-10-27 DIAGNOSIS — Z992 Dependence on renal dialysis: Secondary | ICD-10-CM | POA: Diagnosis not present

## 2023-10-27 DIAGNOSIS — N179 Acute kidney failure, unspecified: Secondary | ICD-10-CM | POA: Diagnosis not present

## 2023-10-27 DIAGNOSIS — N2581 Secondary hyperparathyroidism of renal origin: Secondary | ICD-10-CM | POA: Diagnosis not present

## 2023-11-06 DIAGNOSIS — G5603 Carpal tunnel syndrome, bilateral upper limbs: Secondary | ICD-10-CM | POA: Diagnosis not present

## 2023-11-14 DIAGNOSIS — R319 Hematuria, unspecified: Secondary | ICD-10-CM | POA: Diagnosis not present

## 2023-11-14 DIAGNOSIS — N184 Chronic kidney disease, stage 4 (severe): Secondary | ICD-10-CM | POA: Diagnosis not present

## 2023-11-14 DIAGNOSIS — N2581 Secondary hyperparathyroidism of renal origin: Secondary | ICD-10-CM | POA: Diagnosis not present

## 2023-11-14 DIAGNOSIS — R809 Proteinuria, unspecified: Secondary | ICD-10-CM | POA: Diagnosis not present

## 2023-11-14 DIAGNOSIS — I129 Hypertensive chronic kidney disease with stage 1 through stage 4 chronic kidney disease, or unspecified chronic kidney disease: Secondary | ICD-10-CM | POA: Diagnosis not present

## 2023-11-15 LAB — LAB REPORT - SCANNED
Albumin, Urine POC: 111.4
Albumin/Creatinine Ratio, Urine, POC: 262
Creatinine, POC: 42.5 mg/dL
EGFR: 16

## 2023-11-16 ENCOUNTER — Encounter: Payer: Self-pay | Admitting: Internal Medicine

## 2023-11-16 ENCOUNTER — Ambulatory Visit: Admitting: Internal Medicine

## 2023-11-16 VITALS — BP 108/70 | HR 80 | Temp 98.1°F | Ht 66.0 in | Wt 214.0 lb

## 2023-11-16 DIAGNOSIS — E66811 Obesity, class 1: Secondary | ICD-10-CM

## 2023-11-16 DIAGNOSIS — I5032 Chronic diastolic (congestive) heart failure: Secondary | ICD-10-CM

## 2023-11-16 DIAGNOSIS — I132 Hypertensive heart and chronic kidney disease with heart failure and with stage 5 chronic kidney disease, or end stage renal disease: Secondary | ICD-10-CM

## 2023-11-16 DIAGNOSIS — N2581 Secondary hyperparathyroidism of renal origin: Secondary | ICD-10-CM | POA: Diagnosis not present

## 2023-11-16 DIAGNOSIS — M79642 Pain in left hand: Secondary | ICD-10-CM

## 2023-11-16 DIAGNOSIS — N185 Chronic kidney disease, stage 5: Secondary | ICD-10-CM

## 2023-11-16 DIAGNOSIS — I2510 Atherosclerotic heart disease of native coronary artery without angina pectoris: Secondary | ICD-10-CM

## 2023-11-16 DIAGNOSIS — I441 Atrioventricular block, second degree: Secondary | ICD-10-CM

## 2023-11-16 DIAGNOSIS — E6609 Other obesity due to excess calories: Secondary | ICD-10-CM | POA: Diagnosis not present

## 2023-11-16 DIAGNOSIS — Z6834 Body mass index (BMI) 34.0-34.9, adult: Secondary | ICD-10-CM

## 2023-11-16 NOTE — Progress Notes (Signed)
 I,Victoria T Deloria Lair, CMA,acting as a Neurosurgeon for Gwynneth Aliment, MD.,have documented all relevant documentation on the behalf of Gwynneth Aliment, MD,as directed by  Gwynneth Aliment, MD while in the presence of Gwynneth Aliment, MD.  Subjective:  Patient ID: Andrew Schmitt , male    DOB: August 03, 1951 , 73 y.o.   MRN: [id number]  Chief Complaint  Patient presents with   Hypertension    HPI  Hypertension This is a chronic problem. The current episode started more than 1 year ago. The problem has been gradually improving since onset. Pertinent negatives include no blurred vision, chest pain or palpitations. Risk factors for coronary artery disease include obesity, sedentary lifestyle, male gender and dyslipidemia. Past treatments include direct vasodilators. Identifiable causes of hypertension include chronic renal disease.  Hyperlipidemia This is a chronic problem. The current episode started more than 1 year ago. The problem is controlled. Exacerbating diseases include chronic renal disease and obesity. Pertinent negatives include no chest pain. The current treatment provides moderate improvement of lipids.    Discussed the use of AI scribe software for clinical note transcription with the patient, who gave verbal consent to proceed.   History of Present Illness   Andrew Schmitt is a 73 year old male with hypertension and cholesterol check.  He presents for follow-up after stopping dialysis.   He stopped dialysis approximately two weeks ago and is no longer attending sessions.  He has a history of heart failure but has not had any cardiology appointments scheduled since his last hospital discharge in December, where he had normal heart function. He has not been back to the cardiologist this year.  His blood pressure is well-controlled, which has made him feel less sluggish. He has also maintained his weight loss, contributing to overall health improvement.  He experiences hand stiffness  since December and is considering seeing a hand specialist. He recalls receiving a shot for a similar issue in the past but cannot remember the details.  During dialysis, he received IM treatment for anemia, but the kidney doctor has not mentioned anemia recently.  He is currently taking allopurinol, carvedilol once a day, finasteride, hydralazine once a day, rosuvastatin, tamsulosin, torsemide, and aspirin 81 mg. He is no longer taking doxycycline or midodrine.        Past Medical History:  Diagnosis Date   Ascending aortic aneurysm (HCC) 03/19/2022   AV block, 2nd degree    BPH (benign prostatic hyperplasia)    CHF (congestive heart failure) (HCC)    Chronic kidney disease    ESRD on HD at [facility]  via Right IJ   Chronic pleural effusion    Coronary artery calcification 03/19/2022   Erectile dysfunction    Gout    Heart murmur    Hyperlipidemia    Hypertension    Obesity    Pneumonia      Family History  Problem Relation Age of Onset   Healthy Mother    Healthy Father      Current Outpatient Medications:    acetaminophen (TYLENOL) 500 MG tablet, Take 1,000 mg by mouth every 8 (eight) hours as needed for moderate pain (pain score 4-6)., Disp: , Rfl:    albuterol (VENTOLIN HFA) 108 (90 Base) MCG/ACT inhaler, Inhale 1-2 puffs into the lungs every 6 (six) hours as needed for wheezing or shortness of breath., Disp: , Rfl:    allopurinol (ZYLOPRIM) 100 MG tablet, Take 1 tablet by mouth after dialysis on Monday, Tuesday,  Thursday, and Friday.  Titrate as recommended by nephrology., Disp: 16 tablet, Rfl: 1   benzonatate (TESSALON) 100 MG capsule, Take 100 mg by mouth 3 (three) times daily., Disp: , Rfl:    calcium acetate (PHOSLO) 667 MG capsule, Take 1 capsule (667 mg total) by mouth 3 (three) times daily., Disp: 90 capsule, Rfl: 3   finasteride (PROSCAR) 5 MG tablet, TAKE 1 TABLET BY MOUTH EVERY DAY, Disp: 90 tablet, Rfl: 1   hydrALAZINE (APRESOLINE) 100 MG tablet, Take 100 mg by  mouth daily., Disp: , Rfl:    rosuvastatin (CRESTOR) 10 MG tablet, Take 10 mg by mouth daily., Disp: , Rfl:    tamsulosin (FLOMAX) 0.4 MG CAPS capsule, TAKE 1 CAPSLE BY MOUTH DAILY, Disp: 90 capsule, Rfl: 3   torsemide (DEMADEX) 100 MG tablet, Take 100 mg by mouth daily., Disp: , Rfl:    Allergies  Allergen Reactions   Ace Inhibitors Cough     Review of Systems  Constitutional: Negative.   HENT: Negative.    Eyes:  Negative for blurred vision.  Respiratory: Negative.    Cardiovascular: Negative.  Negative for chest pain and palpitations.  Gastrointestinal: Negative.   Endocrine: Negative.   Genitourinary: Negative.   Allergic/Immunologic: Negative.   Hematological: Negative.      Today's Vitals   11/16/23 0950  BP: 108/70  Pulse: 80  Temp: 98.1 F (36.7 C)  SpO2: 98%  Weight: 214 lb (97.1 kg)  Height: 5\' 6"  (1.676 m)   Body mass index is 34.54 kg/m.  Wt Readings from Last 3 Encounters:  11/16/23 214 lb (97.1 kg)  09/21/23 212 lb (96.2 kg)  08/24/23 232 lb (105.2 kg)     Objective:  Physical Exam Vitals and nursing note reviewed.  Constitutional:      Appearance: Normal appearance. He is obese.  HENT:     Head: Normocephalic and atraumatic.  Eyes:     Extraocular Movements: Extraocular movements intact.  Cardiovascular:     Rate and Rhythm: Normal rate and regular rhythm.     Heart sounds: Normal heart sounds.  Pulmonary:     Effort: Pulmonary effort is normal.     Breath sounds: Normal breath sounds.  Musculoskeletal:     Cervical back: Normal range of motion.  Skin:    General: Skin is warm.  Neurological:     General: No focal deficit present.     Mental Status: He is alert.  Psychiatric:        Mood and Affect: Mood normal.         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  He states he is  taking hydralazine 100mg  and torsemide daily.  Importance of dietary/medication compliance was discussed with the patient. He is encouraged to bring all meds to each MD appt.   Orders: -     AMB referral to CHF clinic  Chronic diastolic heart failure (HCC) [I50.32] Assessment & Plan: Most recent echo reviewed, nl EF with grade 2 DD. He is encouraged to follow low sodium diet, maintain optimal BP and to wear compression hose.   Orders: -     AMB referral to CHF clinic  Coronary artery calcification [I25.10] Assessment & Plan: His coronary calcium score is 132. Importance of medication/dietary compliance was stressed to the patient. He will continue with rosuvastatin and ASA daily.  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 He agrees to Cardiology referral.    Left hand pain Assessment & Plan: He agrees to referral to Hand specialist.   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 Will review lab results performed at dialysis.    Class 1 obesity due to excess calories with serious comorbidity and body mass index (BMI) of 34.0 to 34.9 in adult Assessment & Plan: He is encouraged to initially strive for BMI less than 30 to decrease cardiac risk. He is advised to exercise no less than 150 minutes per week.          Return in 3 months (on 02/15/2024), or chol check.  Patient was given opportunity to ask questions. Patient verbalized understanding of the plan and was able to repeat key elements of the plan. All questions were answered to their satisfaction.     IF YOU HAVE BEEN REFERRED TO A SPECIALIST, IT MAY TAKE 1-2 WEEKS TO SCHEDULE/PROCESS THE REFERRAL. IF YOU HAVE NOT HEARD FROM US/SPECIALIST IN TWO WEEKS, PLEASE GIVE [place] A CALL AT [phone].    THE PATIENT IS ENCOURAGED TO PRACTICE SOCIAL DISTANCING DUE TO THE COVID-19 PANDEMIC.

## 2023-11-16 NOTE — Patient Instructions (Signed)
 Hypertension, Adult Hypertension is another name for high blood pressure. High blood pressure forces your heart to work harder to pump blood. This can cause problems over time. There are two numbers in a blood pressure reading. There is a top number (systolic) over a bottom number (diastolic). It is best to have a blood pressure that is below 120/80. What are the causes? The cause of this condition is not known. Some other conditions can lead to high blood pressure. What increases the risk? Some lifestyle factors can make you more likely to develop high blood pressure: Smoking. Not getting enough exercise or physical activity. Being overweight. Having too much fat, sugar, calories, or salt (sodium) in your diet. Drinking too much alcohol. Other risk factors include: Having any of these conditions: Heart disease. Diabetes. High cholesterol. Kidney disease. Obstructive sleep apnea. Having a family history of high blood pressure and high cholesterol. Age. The risk increases with age. Stress. What are the signs or symptoms? High blood pressure may not cause symptoms. Very high blood pressure (hypertensive crisis) may cause: Headache. Fast or uneven heartbeats (palpitations). Shortness of breath. Nosebleed. Vomiting or feeling like you may vomit (nauseous). Changes in how you see. Very bad chest pain. Feeling dizzy. Seizures. How is this treated? This condition is treated by making healthy lifestyle changes, such as: Eating healthy foods. Exercising more. Drinking less alcohol. Your doctor may prescribe medicine if lifestyle changes do not help enough and if: Your top number is above 130. Your bottom number is above 80. Your personal target blood pressure may vary. Follow these instructions at home: Eating and drinking  If told, follow the DASH eating plan. To follow this plan: Fill one half of your plate at each meal with fruits and vegetables. Fill one fourth of your plate  at each meal with whole grains. Whole grains include whole-wheat pasta, brown rice, and whole-grain bread. Eat or drink low-fat dairy products, such as skim milk or low-fat yogurt. Fill one fourth of your plate at each meal with low-fat (lean) proteins. Low-fat proteins include fish, chicken without skin, eggs, beans, and tofu. Avoid fatty meat, cured and processed meat, or chicken with skin. Avoid pre-made or processed food. Limit the amount of salt in your diet to less than 1,500 mg each day. Do not drink alcohol if: Your doctor tells you not to drink. You are pregnant, may be pregnant, or are planning to become pregnant. If you drink alcohol: Limit how much you have to: 0-1 drink a day for women. 0-2 drinks a day for men. Know how much alcohol is in your drink. In the [place] one drink equals one 12 oz bottle of beer (355 mL), one 5 oz glass of wine (148 mL), or one 1 oz glass of hard liquor (44 mL). Lifestyle  Work with your doctor to stay at a healthy weight or to lose weight. Ask your doctor what the best weight is for you. Get at least 30 minutes of exercise that causes your heart to beat faster (aerobic exercise) most days of the week. This may include walking, swimming, or biking. Get at least 30 minutes of exercise that strengthens your muscles (resistance exercise) at least 3 days a week. This may include lifting weights or doing Pilates. Do not smoke or use any products that contain nicotine or tobacco. If you need help quitting, ask your doctor. Check your blood pressure at home as told by your doctor. Keep all follow-up visits. Medicines Take over-the-counter and prescription medicines  only as told by your doctor. Follow directions carefully. Do not skip doses of blood pressure medicine. The medicine does not work as well if you skip doses. Skipping doses also puts you at risk for problems. Ask your doctor about side effects or reactions to medicines that you should watch  for. Contact a doctor if: You think you are having a reaction to the medicine you are taking. You have headaches that keep coming back. You feel dizzy. You have swelling in your ankles. You have trouble with your vision. Get help right away if: You get a very bad headache. You start to feel mixed up (confused). You feel weak or numb. You feel faint. You have very bad pain in your: Chest. Belly (abdomen). You vomit more than once. You have trouble breathing. These symptoms may be an emergency. Get help right away. Call 911. Do not wait to see if the symptoms will go away. Do not drive yourself to the hospital. Summary Hypertension is another name for high blood pressure. High blood pressure forces your heart to work harder to pump blood. For most people, a normal blood pressure is less than 120/80. Making healthy choices can help lower blood pressure. If your blood pressure does not get lower with healthy choices, you may need to take medicine. This information is not intended to replace advice given to you by your health care provider. Make sure you discuss any questions you have with your health care provider. Document Revised: 05/21/2021 Document Reviewed: 05/21/2021 Elsevier Patient Education  2024 [employer].

## 2023-11-17 ENCOUNTER — Ambulatory Visit: Payer: Medicare PPO | Admitting: Internal Medicine

## 2023-11-19 NOTE — Assessment & Plan Note (Signed)
 Chronic, he has Renal panel performed with Nephrology. Will review lab results performed at dialysis.

## 2023-11-19 NOTE — Assessment & Plan Note (Signed)
 He is encouraged to initially strive for BMI less than 30 to decrease cardiac risk. He is advised to exercise no less than 150 minutes per week.

## 2023-11-19 NOTE — Assessment & Plan Note (Signed)
 His coronary calcium score is 132. Importance of medication/dietary compliance was stressed to the patient. He will continue with rosuvastatin and ASA daily.

## 2023-11-19 NOTE — Assessment & Plan Note (Signed)
 He agrees to referral to Hand specialist.

## 2023-11-19 NOTE — Assessment & Plan Note (Addendum)
 Chronic, well controlled.  He states he is taking hydralazine 100mg  and torsemide daily.  Importance of dietary/medication compliance was discussed with the patient. He is encouraged to bring all meds to each MD appt.

## 2023-11-19 NOTE — Assessment & Plan Note (Signed)
 He is now off of carvedilol, no Cardiology f/u since discharge. He agrees to Cardiology referral.

## 2023-11-19 NOTE — Assessment & Plan Note (Signed)
 Most recent echo reviewed, nl EF with grade 2 DD. He is encouraged to follow low sodium diet, maintain optimal BP and to wear compression hose.

## 2023-11-22 DIAGNOSIS — N185 Chronic kidney disease, stage 5: Secondary | ICD-10-CM | POA: Insufficient documentation

## 2023-11-22 NOTE — Assessment & Plan Note (Signed)
 He is followed by Nephrology, he states he was advised his kidney function has improved and dialysis is no longer needed. Will request most recent notes, not available at this time .

## 2023-11-25 ENCOUNTER — Ambulatory Visit: Admitting: Physician Assistant

## 2023-12-05 DIAGNOSIS — N179 Acute kidney failure, unspecified: Secondary | ICD-10-CM | POA: Diagnosis not present

## 2023-12-13 ENCOUNTER — Ambulatory Visit: Admitting: Physician Assistant

## 2023-12-20 ENCOUNTER — Ambulatory Visit: Admitting: Physician Assistant

## 2023-12-20 ENCOUNTER — Encounter: Payer: Self-pay | Admitting: Physician Assistant

## 2023-12-20 ENCOUNTER — Other Ambulatory Visit (INDEPENDENT_AMBULATORY_CARE_PROVIDER_SITE_OTHER)

## 2023-12-20 DIAGNOSIS — M79642 Pain in left hand: Secondary | ICD-10-CM | POA: Diagnosis not present

## 2023-12-20 DIAGNOSIS — M79641 Pain in right hand: Secondary | ICD-10-CM

## 2023-12-20 NOTE — Progress Notes (Signed)
 Office Visit Note   Patient: Andrew Schmitt           Date of Birth: 04/17/1951           MRN: [id number] Visit Date: 12/20/2023              Requested by: Cleave Curling, MD [address] [place],  [place] [address] PCP: Cleave Curling, MD   Assessment & Plan: Visit Diagnoses:  1. Pain in right hand   2. Pain in left hand     Plan: Pleasant 73 year old gentleman comes in today with bilateral hand pain.  He said this first started in December but he has also had trouble with it in the past.  He was hospitalized for extensive period of time during this time not sure if this is what caused it.  Describes his hands as aching and stiff.  Does not really acknowledge any paresthesias.  On the right hand it is painful at the base of his thumb and feels stiff.  On the left hand it is more his short digit.  Has not done any therapy.  Both of his daughters are physicians.  They have requested him to see a hand surgeon.  I actually think he do quite good starting out with some occupational therapy but he does not want to do this.  Will refer him to hand for evaluation  Follow-Up Instructions: With hand surgery  Orders:  Orders Placed This Encounter  Procedures   XR Hand Complete Right   XR Hand Complete Left   Ambulatory referral to Hand Surgery   No orders of the defined types were placed in this encounter.     Procedures: No procedures performed   Clinical Data: No additional findings.   Subjective: No chief complaint on file.   HPI patient is a pleasant 73 year old gentleman comes in today with bilateral hand pain since December.  Takes Tylenol  or Aleve  for the pain.  He states his hands are stiff.  On the left the short finger is stiff as well as the right thumb.  No history of injury  Review of Systems  All other systems reviewed and are negative.    Objective: Vital Signs: There were no vitals taken for this visit.  Physical Exam Constitutional:       Appearance: Normal appearance.  Pulmonary:     Effort: Pulmonary effort is normal.  Skin:    General: Skin is warm.  Neurological:     General: No focal deficit present.     Mental Status: He is alert and oriented to person, place, and time.  Psychiatric:        Mood and Affect: Mood normal.        Behavior: Behavior normal.     Ortho Exam Bilateral hands no redness no erythema strong pulses grip strength is fair.  Does have some mild swelling in the left hand at the fifth PIP joint.  Also some tenderness at the base of the thumb and the right hand.  Grip strength intact good extension and flexion no particular area of tenderness.  Negative Tinel's sign bilaterally Specialty Comments:  No specialty comments available.  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 09/11/2023   Malnutrition of moderate degree 08/09/2023   Exudative pleural effusion 08/01/2023   BPH (benign prostatic hyperplasia) 07/27/2023   Obesity, class 2 07/27/2023   Pleural effusion 07/26/2023   Pleural effusion on left 07/22/2023   Second degree AV block, Mobitz type I 07/22/2023   Elevated troponin 07/22/2023   Hematuria 07/22/2023   Bilateral renal cysts 07/22/2023   Acute kidney injury superimposed on chronic kidney disease (HCC) 07/22/2023   Acute on chronic diastolic CHF (congestive heart failure) (HCC) 07/21/2023   Chronic diastolic  heart failure (HCC) 16/05/9603   Stage 3b chronic kidney disease (HCC) 07/07/2023   Chronic gout due to renal impairment without tophus 12/08/2022   Coronary artery calcification 03/19/2022   Ascending aortic aneurysm (HCC) 03/19/2022   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   Shortness of breath 10/15/2021   CKD (chronic kidney disease), stage IV (HCC) 04/05/2019   Hypertension with heart disease 03/07/2019   Hyperlipidemia 03/07/2019   Class 2 severe obesity due to excess calories with serious comorbidity and body mass index (BMI) of 37.0 to 37.9 in adult [facility]) 03/07/2019   Past Medical History:  Diagnosis Date   Ascending aortic aneurysm (HCC) 03/19/2022   AV block, 2nd degree    BPH (benign prostatic hyperplasia)    CHF (congestive heart failure) (HCC)    Chronic kidney disease    ESRD on HD at [facility]  via Right IJ   Chronic pleural effusion    Coronary artery calcification 03/19/2022   Erectile dysfunction    Gout    Heart murmur    Hyperlipidemia    Hypertension    Obesity    Pneumonia     Family History  Problem Relation Age of Onset   Healthy Mother    Healthy Father     Past Surgical History:  Procedure Laterality Date   CARDIAC SURGERY     drained fluid only   IR FLUORO GUIDE CV LINE RIGHT  08/04/2023   IR FLUORO GUIDE CV LINE RIGHT  08/09/2023   IR US  GUIDE VASC ACCESS RIGHT  08/04/2023   IR US  GUIDE VASC ACCESS RIGHT  08/11/2023   THORACENTESIS Left 07/26/2023   Procedure: THORACENTESIS;  Surgeon: Guerry Leek, MD;  Location: [facility] ENDOSCOPY;  Service: Pulmonary;  Laterality: Left;   TRANSURETHRAL RESECTION OF BLADDER TUMOR N/A 09/28/2023   Procedure: TRANSURETHRAL RESECTION OF BLADDER TUMOR (TURBT) , BILATERAL RETROGRADE PYELOGRAM;  Surgeon: Samson Croak, MD;  Location: [facility] ORS;  Service: Urology;  Laterality: N/A;  60 MINUTE CASE   Social History   Occupational History    Occupation: retired  Tobacco Use   Smoking status: Never   Smokeless tobacco: Never  Vaping Use   Vaping status: Never Used  Substance and Sexual Activity   Alcohol use: No   Drug use: No   Sexual activity: Yes

## 2023-12-21 ENCOUNTER — Other Ambulatory Visit: Payer: Self-pay | Admitting: Internal Medicine

## 2023-12-27 ENCOUNTER — Encounter: Payer: Self-pay | Admitting: Internal Medicine

## 2023-12-27 NOTE — Progress Notes (Signed)
 SCANNED FORM.

## 2024-01-04 DIAGNOSIS — N2581 Secondary hyperparathyroidism of renal origin: Secondary | ICD-10-CM | POA: Diagnosis not present

## 2024-01-04 DIAGNOSIS — R319 Hematuria, unspecified: Secondary | ICD-10-CM | POA: Diagnosis not present

## 2024-01-04 DIAGNOSIS — N184 Chronic kidney disease, stage 4 (severe): Secondary | ICD-10-CM | POA: Diagnosis not present

## 2024-01-04 DIAGNOSIS — I129 Hypertensive chronic kidney disease with stage 1 through stage 4 chronic kidney disease, or unspecified chronic kidney disease: Secondary | ICD-10-CM | POA: Diagnosis not present

## 2024-01-04 DIAGNOSIS — R809 Proteinuria, unspecified: Secondary | ICD-10-CM | POA: Diagnosis not present

## 2024-01-05 LAB — LAB REPORT - SCANNED
Alb/Creat Ratio, Ur: 377 mg/g{creat}
Albumin, Urine POC: 221.8
PTH, Intact: 255

## 2024-01-07 ENCOUNTER — Other Ambulatory Visit: Payer: Self-pay | Admitting: Internal Medicine

## 2024-01-10 ENCOUNTER — Telehealth: Payer: Self-pay | Admitting: Internal Medicine

## 2024-01-10 NOTE — Telephone Encounter (Unsigned)
 Copied from CRM [id number]. Topic: Clinical - Medication Refill >> Jan 10, 2024 11:36 AM Baldo Levan wrote: Medication:  allopurinol  (ZYLOPRIM ) 100 MG tablet [[id number]]   Patient states this should be 300 MG  Has the patient contacted their pharmacy? Yes (Agent: If no, request that the patient contact the pharmacy for the refill. If patient does not wish to contact the pharmacy document the reason why and proceed with request.) (Agent: If yes, when and what did the pharmacy advise?)  This is the patient's preferred pharmacy:  [facility] [phone] [facility] - [address] [place] [address] [place] [address] Phone: [phone] Fax: [phone]  Is this the correct pharmacy for this prescription? Yes If no, delete pharmacy and type the correct one.   Has the prescription been filled recently? No  Is the patient out of the medication? Yes  Has the patient been seen for an appointment in the last year OR does the patient have an upcoming appointment? Yes  Can we respond through MyChart? No  Agent: Please be advised that Rx refills may take up to 3 business days. We ask that you follow-up with your pharmacy.

## 2024-01-17 ENCOUNTER — Ambulatory Visit: Admitting: Orthopedic Surgery

## 2024-01-17 DIAGNOSIS — R200 Anesthesia of skin: Secondary | ICD-10-CM

## 2024-01-17 DIAGNOSIS — R202 Paresthesia of skin: Secondary | ICD-10-CM | POA: Diagnosis not present

## 2024-01-17 DIAGNOSIS — M79642 Pain in left hand: Secondary | ICD-10-CM | POA: Diagnosis not present

## 2024-01-17 DIAGNOSIS — M79641 Pain in right hand: Secondary | ICD-10-CM | POA: Diagnosis not present

## 2024-01-17 NOTE — Progress Notes (Signed)
 Andrew Schmitt - 73 y.o. male MRN [id number]  Date of birth: 04-May-1951  Office Visit Note: Visit Date: 01/17/2024 PCP: Cleave Curling, MD Referred by: Persons, Norma Beckers, PA  Subjective: No chief complaint on file.  HPI: Andrew Schmitt is a pleasant 73 y.o. male who presents today for evaluation of bilateral hand numbness and tingling that is been present now for approximately 6 months.  He describes numbness in the long, ring and small fingers bilaterally.  Has not undergone any significant prior workup or treatment for this.  He was seen by [facility] persons PA recently, was offered potential occupational therapy however he declined at that time.  He is being seen by myself today for specific hand surgical evaluation.  Pertinent ROS were reviewed with the patient and found to be negative unless otherwise specified above in HPI.   Visit Reason: Bilateral hand numbness and tingling Duration of symptoms: 6 months Hand dominance: right Occupation:retired Diabetic: No Smoking: No Heart/Lung History: none Blood Thinners: baby aspirin  at times  Prior Testing/EMG:none Injections (Date): trigger inj Treatments:none Prior Surgery:none  Assessment & Plan: Visit Diagnoses:  1. Numbness and tingling in both hands   2. Pain in right hand   3. Pain in left hand     Plan: Based on clinical examination today, there is concern for potential bilateral carpal tunnel and cubital tunnel syndrome.  His distribution of numbness involves both nerve distributions, does have significant hypersensitivity in the ulnar distribution bilaterally as well.  I would like to further investigate this with the electrodiagnostic study of the bilateral upper extremities in order to determine potential nerve compression or pathology.  He will return to me after the nerve studies are complete to review results and discuss appropriate next steps.  Follow-up: No follow-ups on file.   Meds & Orders: No orders  of the defined types were placed in this encounter.   Orders Placed This Encounter  Procedures   Ambulatory referral to Physical Medicine Rehab     Procedures: No procedures performed      Clinical History: No specialty comments available.  He reports that he has never smoked. He has never used smokeless tobacco. No results for input(s): "HGBA1C", "LABURIC" in the last 8760 hours.  Objective:   Vital Signs: There were no vitals taken for this visit.  Physical Exam  Gen: Well-appearing, in no acute distress; non-toxic CV: Regular Rate. Well-perfused. Warm.  Resp: Breathing unlabored on room air; no wheezing. Psych: Fluid speech in conversation; appropriate affect; normal thought process  Ortho Exam PHYSICAL EXAM:  General: Patient is well appearing and in no distress.   Skin and Muscle: No significant skin changes are apparent to upper extremities.   Range of Motion and Palpation Tests: Mobility is full about the elbows with flexion and extension.  No evidence of nerve subluxation at bilateral elbow.  Forearm supination and pronation are 85/85 bilaterally.  Wrist flexion/extension is 75/65 bilaterally.  Digital flexion and extension are full.  Thumb opposition is full to the base of the small fingers bilaterally.    No cords or nodules are palpated.  No triggering is observed.     Neurologic, Vascular, Motor: Sensation is diminished to light touch in the bilateral long, ring and small finger regions.    Thenar atrophy: Negative bilaterally Tinel sign: Mildly positive bilateral carpal tunnel, negative bilateral cubital tunnel Carpal tunnel compression: Mildly positive bilaterally Phalen test: Negative bilaterally  Sensory bilateral hand 2-point discrimination: 5-65mm in median and  ulnar distributions  Motor bilateral hand FPL: 5/5 Index FDP: 5/5 APB: 5/5 Small finger FDP: 5/5 Negative Froment's, negative for FDI wasting bilaterally  Fingers pink and well  perfused.  Capillary refill is brisk.     Lab Results  Component Value Date   HGBA1C 5.4 04/09/2021     Imaging: No results found.  Past Medical/Family/Surgical/Social History: Medications & Allergies reviewed per EMR, new medications updated. 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 09/11/2023   Malnutrition of moderate degree 08/09/2023   Exudative pleural effusion 08/01/2023   BPH (benign prostatic hyperplasia) 07/27/2023   Obesity, class 2 07/27/2023   Pleural effusion 07/26/2023   Pleural effusion on left 07/22/2023   Second degree AV block, Mobitz type I 07/22/2023   Elevated troponin 07/22/2023   Hematuria 07/22/2023   Bilateral renal cysts 07/22/2023   Acute kidney injury superimposed on chronic kidney disease (HCC) 07/22/2023   Acute on chronic diastolic CHF (congestive heart failure) (HCC) 07/21/2023   Chronic diastolic heart failure (HCC) 07/14/2023   Stage 3b chronic kidney disease (HCC) 07/07/2023   Chronic gout due to renal impairment without tophus 12/08/2022   Coronary artery calcification 03/19/2022   Ascending aortic aneurysm (HCC) 03/19/2022   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   Shortness of breath 10/15/2021   CKD (chronic kidney disease), stage IV (HCC) 04/05/2019   Hypertension with heart disease 03/07/2019   Hyperlipidemia 03/07/2019   Class 2 severe obesity due to excess calories with serious comorbidity and body mass index (BMI) of 37.0 to 37.9 in adult [facility]) 03/07/2019   Past Medical History:  Diagnosis Date   Ascending aortic aneurysm (HCC) 03/19/2022   AV  block, 2nd degree    BPH (benign prostatic hyperplasia)    CHF (congestive heart failure) (HCC)    Chronic kidney disease    ESRD on HD at [place] [facility]  via Right IJ   Chronic pleural effusion    Coronary artery calcification 03/19/2022   Erectile dysfunction    Gout    Heart murmur    Hyperlipidemia    Hypertension    Obesity    Pneumonia    Family History  Problem Relation Age of Onset   Healthy Mother    Healthy Father    Past Surgical History:  Procedure Laterality Date   CARDIAC SURGERY     drained fluid only   IR FLUORO GUIDE CV LINE RIGHT  08/04/2023   IR FLUORO GUIDE CV LINE RIGHT  08/09/2023   IR US  GUIDE VASC ACCESS RIGHT  08/04/2023   IR US  GUIDE VASC ACCESS RIGHT  08/11/2023   THORACENTESIS Left 07/26/2023   Procedure: THORACENTESIS;  Surgeon: Guerry Leek, MD;  Location: [facility] ENDOSCOPY;  Service: Pulmonary;  Laterality: Left;   TRANSURETHRAL RESECTION OF BLADDER TUMOR N/A 09/28/2023   Procedure: TRANSURETHRAL RESECTION OF BLADDER TUMOR (TURBT) , BILATERAL RETROGRADE PYELOGRAM;  Surgeon: Samson Croak, MD;  Location: [facility] ORS;  Service: Urology;  Laterality: N/A;  60 MINUTE CASE   Social History   Occupational History   Occupation: retired  Tobacco Use   Smoking  status: Never   Smokeless tobacco: Never  Vaping Use   Vaping status: Never Used  Substance and Sexual Activity   Alcohol use: No   Drug use: No   Sexual activity: Yes    Alainah Phang Merlinda Starling) Marce Sensing, M.D. [facility], Hand Surgery

## 2024-01-18 ENCOUNTER — Other Ambulatory Visit: Payer: Self-pay | Admitting: Internal Medicine

## 2024-01-18 DIAGNOSIS — M79642 Pain in left hand: Secondary | ICD-10-CM

## 2024-01-23 DIAGNOSIS — S61012A Laceration without foreign body of left thumb without damage to nail, initial encounter: Secondary | ICD-10-CM | POA: Diagnosis not present

## 2024-01-25 ENCOUNTER — Other Ambulatory Visit: Payer: Self-pay | Admitting: Internal Medicine

## 2024-01-28 ENCOUNTER — Other Ambulatory Visit: Payer: Self-pay | Admitting: Internal Medicine

## 2024-02-02 ENCOUNTER — Other Ambulatory Visit: Payer: Self-pay

## 2024-02-02 DIAGNOSIS — M65321 Trigger finger, right index finger: Secondary | ICD-10-CM | POA: Diagnosis not present

## 2024-02-02 DIAGNOSIS — M65352 Trigger finger, left little finger: Secondary | ICD-10-CM | POA: Diagnosis not present

## 2024-02-02 DIAGNOSIS — M25642 Stiffness of left hand, not elsewhere classified: Secondary | ICD-10-CM | POA: Diagnosis not present

## 2024-02-02 DIAGNOSIS — M79642 Pain in left hand: Secondary | ICD-10-CM | POA: Diagnosis not present

## 2024-02-02 DIAGNOSIS — M79641 Pain in right hand: Secondary | ICD-10-CM | POA: Diagnosis not present

## 2024-02-02 MED ORDER — ALLOPURINOL 100 MG PO TABS
ORAL_TABLET | ORAL | 1 refills | Status: DC
Start: 2024-02-02 — End: 2024-03-08

## 2024-02-06 DIAGNOSIS — N184 Chronic kidney disease, stage 4 (severe): Secondary | ICD-10-CM | POA: Diagnosis not present

## 2024-02-10 DIAGNOSIS — I129 Hypertensive chronic kidney disease with stage 1 through stage 4 chronic kidney disease, or unspecified chronic kidney disease: Secondary | ICD-10-CM | POA: Diagnosis not present

## 2024-02-10 DIAGNOSIS — N2581 Secondary hyperparathyroidism of renal origin: Secondary | ICD-10-CM | POA: Diagnosis not present

## 2024-02-10 DIAGNOSIS — R809 Proteinuria, unspecified: Secondary | ICD-10-CM | POA: Diagnosis not present

## 2024-02-10 DIAGNOSIS — N184 Chronic kidney disease, stage 4 (severe): Secondary | ICD-10-CM | POA: Diagnosis not present

## 2024-02-10 DIAGNOSIS — N179 Acute kidney failure, unspecified: Secondary | ICD-10-CM | POA: Diagnosis not present

## 2024-02-14 ENCOUNTER — Other Ambulatory Visit: Payer: Self-pay | Admitting: Internal Medicine

## 2024-02-16 ENCOUNTER — Encounter: Payer: Self-pay | Admitting: Internal Medicine

## 2024-02-16 ENCOUNTER — Ambulatory Visit: Admitting: Internal Medicine

## 2024-02-16 VITALS — BP 118/70 | HR 72 | Temp 98.7°F | Ht 66.0 in | Wt 218.8 lb

## 2024-02-16 DIAGNOSIS — E66812 Obesity, class 2: Secondary | ICD-10-CM

## 2024-02-16 DIAGNOSIS — M1A3790 Chronic gout due to renal impairment, unspecified ankle and foot, without tophus (tophi): Secondary | ICD-10-CM

## 2024-02-16 DIAGNOSIS — I2510 Atherosclerotic heart disease of native coronary artery without angina pectoris: Secondary | ICD-10-CM | POA: Diagnosis not present

## 2024-02-16 DIAGNOSIS — I132 Hypertensive heart and chronic kidney disease with heart failure and with stage 5 chronic kidney disease, or end stage renal disease: Secondary | ICD-10-CM

## 2024-02-16 DIAGNOSIS — E7800 Pure hypercholesterolemia, unspecified: Secondary | ICD-10-CM

## 2024-02-16 DIAGNOSIS — I5032 Chronic diastolic (congestive) heart failure: Secondary | ICD-10-CM

## 2024-02-16 DIAGNOSIS — Z6835 Body mass index (BMI) 35.0-35.9, adult: Secondary | ICD-10-CM

## 2024-02-16 DIAGNOSIS — N184 Chronic kidney disease, stage 4 (severe): Secondary | ICD-10-CM | POA: Diagnosis not present

## 2024-02-16 DIAGNOSIS — I441 Atrioventricular block, second degree: Secondary | ICD-10-CM | POA: Diagnosis not present

## 2024-02-16 DIAGNOSIS — N185 Chronic kidney disease, stage 5: Secondary | ICD-10-CM

## 2024-02-16 NOTE — Progress Notes (Signed)
 I,Andrew Schmitt, CMA,acting as a Neurosurgeon for Andrew LOISE Slocumb, MD.,have documented all relevant documentation on the behalf of Andrew LOISE Slocumb, MD,as directed by  Andrew LOISE Slocumb, MD while in the presence of Andrew LOISE Slocumb, MD.  Subjective:  Patient ID: Andrew Schmitt , male    DOB: 20-Mar-1951 , 73 y.o.   MRN: [id number]  Chief Complaint  Patient presents with   Hypertension    Patient presents today for bp & cholesterol follow up. He reports compliance with medications. Denies headache, chest pain & sob. He reports now taking Allopurinol  300mg . 100mg  documented in his chart.   Hyperlipidemia    HPI Discussed the use of AI scribe software for clinical note transcription with the patient, who gave verbal consent to proceed.  History of Present Illness Andrew Schmitt is a 73 year old male with hypertension and chronic kidney disease who presents for a cholesterol check.  He has a history of chronic kidney disease and was previously on dialysis three to four days a week. His nephrologist recently adjusted his medication regimen, discontinuing furosemide  and chlorthalidone while continuing torsemide . He is no longer undergoing dialysis.  He has a history of gout, typically affecting the foot between the toes. He has been off allopurinol  for three to four weeks due to a prescription refill issue. The previous dose was 300 mg, but the pharmacy did not update the prescription. Despite being off allopurinol , he has not experienced a gout flare during this time.  His carvedilol prescription is for twice daily dosing, which he was already taking. He has not been focusing on his medications during dialysis sessions.  He was reminded of his appointment by a feeling of divine intervention, as he had forgotten about it.  Hypertension This is a chronic problem. The current episode started more than 1 year ago. The problem has been gradually improving since onset. Pertinent negatives include no  blurred vision, chest pain or palpitations. Risk factors for coronary artery disease include obesity, sedentary lifestyle, male gender and dyslipidemia. Past treatments include direct vasodilators. Identifiable causes of hypertension include chronic renal disease.  Hyperlipidemia This is a chronic problem. The current episode started more than 1 year ago. The problem is controlled. Exacerbating diseases include chronic renal disease and obesity. Pertinent negatives include no chest pain. The current treatment provides moderate improvement of lipids.     Past Medical History:  Diagnosis Date   Ascending aortic aneurysm (HCC) 03/19/2022   AV block, 2nd degree    BPH (benign prostatic hyperplasia)    CHF (congestive heart failure) (HCC)    Chronic kidney disease    ESRD on HD at [facility]  via Right IJ   Chronic pleural effusion    Coronary artery calcification 03/19/2022   Erectile dysfunction    Gout    Heart murmur    Hyperlipidemia    Hypertension    Obesity    Pneumonia      Family History  Problem Relation Age of Onset   Healthy Mother    Healthy Father      Current Outpatient Medications:    acetaminophen  (TYLENOL ) 500 MG tablet, Take 1,000 mg by mouth every 8 (eight) hours as needed for moderate pain (pain score 4-6)., Disp: , Rfl:    albuterol  (VENTOLIN  HFA) 108 (90 Base) MCG/ACT inhaler, Inhale 1-2 puffs into the lungs every 6 (six) hours as needed for wheezing or shortness of breath., Disp: , Rfl:    allopurinol  (ZYLOPRIM ) 100 MG  tablet, Take 1 tablet by mouth after dialysis on Monday, Tuesday, Thursday, and Friday.  Titrate as recommended by nephrology. (Patient taking differently: 300 mg. Take 1 tablet by mouth after dialysis on Monday, Tuesday, Thursday, and Friday.  Titrate as recommended by nephrology.), Disp: 16 tablet, Rfl: 1   benzonatate (TESSALON) 100 MG capsule, Take 100 mg by mouth 3 (three) times daily., Disp: , Rfl:    calcium  acetate (PHOSLO ) 667 MG capsule,  Take 1 capsule (667 mg total) by mouth 3 (three) times daily., Disp: 90 capsule, Rfl: 3   finasteride  (PROSCAR ) 5 MG tablet, TAKE 1 TABLET BY MOUTH EVERY DAY, Disp: 90 tablet, Rfl: 1   hydrALAZINE  (APRESOLINE ) 100 MG tablet, Take 100 mg by mouth daily., Disp: , Rfl:    rosuvastatin  (CRESTOR ) 10 MG tablet, Take 10 mg by mouth daily., Disp: , Rfl:    tamsulosin  (FLOMAX ) 0.4 MG CAPS capsule, TAKE 1 CAPSLE BY MOUTH DAILY, Disp: 90 capsule, Rfl: 3   torsemide  (DEMADEX ) 100 MG tablet, Take 100 mg by mouth daily., Disp: , Rfl:    Allergies  Allergen Reactions   Ace Inhibitors Cough     Review of Systems  Constitutional: Negative.   HENT: Negative.    Eyes:  Negative for blurred vision.  Respiratory: Negative.    Cardiovascular: Negative.  Negative for chest pain and palpitations.  Gastrointestinal: Negative.   Endocrine: Negative.   Skin: Negative.   Allergic/Immunologic: Negative.   Neurological: Negative.   Hematological: Negative.      Today's Vitals   02/16/24 1137  BP: 118/70  Pulse: 72  Temp: 98.7 F (37.1 C)  SpO2: 98%  Weight: 218 lb 12.8 oz (99.2 kg)  Height: 5' 6 (1.676 m)   Body mass index is 35.32 kg/m.  Wt Readings from Last 3 Encounters:  02/16/24 218 lb 12.8 oz (99.2 kg)  11/16/23 214 lb (97.1 kg)  09/21/23 212 lb (96.2 kg)     Objective:  Physical Exam Vitals and nursing note reviewed.  Constitutional:      Appearance: Normal appearance.  HENT:     Head: Normocephalic and atraumatic.  Eyes:     Extraocular Movements: Extraocular movements intact.  Cardiovascular:     Rate and Rhythm: Normal rate and regular rhythm.     Heart sounds: Normal heart sounds.  Pulmonary:     Effort: Pulmonary effort is normal.     Breath sounds: Normal breath sounds.  Musculoskeletal:     Cervical back: Normal range of motion.  Skin:    General: Skin is warm.  Neurological:     General: No focal deficit present.     Mental Status: He is alert.  Psychiatric:         Mood and Affect: Mood normal.         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  He states he is taking hydralazine  100mg  and torsemide  daily.  Importance of dietary/medication compliance was discussed with the patient. He is encouraged to bring all meds to each MD appt.   Orders: -     CMP14+EGFR -     Lipid panel  Chronic diastolic heart failure (HCC) [I50.32] Assessment & Plan: Chronic, he is encouraged to follow low sodium diet, maintain optimal BP and to wear compression hose.    Chronic kidney disease, stage IV (severe) (HCC) Assessment & Plan:  He is followed by Nephrology, he states he was advised his kidney function has improved and dialysis is no longer needed. Will request most recent notes, not available at this time .    Coronary artery calcification [I25.10] Assessment & Plan: His coronary calcium  score is 132. Importance of medication/dietary compliance was stressed to the patient. He will continue with rosuvastatin  and ASA daily.    Second degree AV block, Mobitz type I [I44.1] Assessment & Plan: He is now off of carvedilol, still no Cardiology f/u since discharge. He has yet to schedule Cardiology evaluation.      Chronic gout due to renal impairment involving foot without tophus, unspecified laterality Assessment & Plan: Gout history, currently asymptomatic. Off allopurinol  due to refill issue. Dose adjusted to 100 mg due to kidney function. - Check uric acid level today. - Instruct him to pick up 100 mg allopurinol  from the pharmacy. - Recheck uric acid level after one month on 100 mg allopurinol . - Adjust allopurinol  dose based on kidney function and uric acid levels. - He will likely not be able to tolerate allopurinol  dose any higher than 50mg  w/ every other day dosing  Orders: -     Uric  acid  Pure hypercholesterolemia Assessment & Plan: Chronic, LDL goal is less than 70 due to underlying CAD. Will continue with rosuvastatin  20mg  daily.   Class 2 severe obesity due to excess calories with serious comorbidity and body mass index (BMI) of 35.0 to 35.9 in adult [facility]) Assessment & Plan: Chronic, he has gained 6lbs since February.  Importance of following recommended low sodium diet and medication compliance was discussed with the patient and his wife in detail.     Return in 4 weeks (on 03/15/2024), or lab visit - bmp/uric acid.  Patient was given opportunity to ask questions. Patient verbalized understanding of the plan and was able to repeat key elements of the plan. All questions were answered to their satisfaction.   I, Andrew LOISE Slocumb, MD, have reviewed all documentation for this visit. The documentation on 02/16/24 for the exam, diagnosis, procedures, and orders are all accurate and complete.   IF YOU HAVE BEEN REFERRED TO A SPECIALIST, IT MAY TAKE 1-2 WEEKS TO SCHEDULE/PROCESS THE REFERRAL. IF YOU HAVE NOT HEARD FROM US /SPECIALIST IN TWO WEEKS, PLEASE GIVE US  A CALL AT [phone].   THE PATIENT IS ENCOURAGED TO PRACTICE SOCIAL DISTANCING DUE TO THE COVID-19 PANDEMIC.

## 2024-02-16 NOTE — Patient Instructions (Signed)
 Hypertension, Adult Hypertension is another name for high blood pressure. High blood pressure forces your heart to work harder to pump blood. This can cause problems over time. There are two numbers in a blood pressure reading. There is a top number (systolic) over a bottom number (diastolic). It is best to have a blood pressure that is below 120/80. What are the causes? The cause of this condition is not known. Some other conditions can lead to high blood pressure. What increases the risk? Some lifestyle factors can make you more likely to develop high blood pressure: Smoking. Not getting enough exercise or physical activity. Being overweight. Having too much fat, sugar, calories, or salt (sodium) in your diet. Drinking too much alcohol. Other risk factors include: Having any of these conditions: Heart disease. Diabetes. High cholesterol. Kidney disease. Obstructive sleep apnea. Having a family history of high blood pressure and high cholesterol. Age. The risk increases with age. Stress. What are the signs or symptoms? High blood pressure may not cause symptoms. Very high blood pressure (hypertensive crisis) may cause: Headache. Fast or uneven heartbeats (palpitations). Shortness of breath. Nosebleed. Vomiting or feeling like you may vomit (nauseous). Changes in how you see. Very bad chest pain. Feeling dizzy. Seizures. How is this treated? This condition is treated by making healthy lifestyle changes, such as: Eating healthy foods. Exercising more. Drinking less alcohol. Your doctor may prescribe medicine if lifestyle changes do not help enough and if: Your top number is above 130. Your bottom number is above 80. Your personal target blood pressure may vary. Follow these instructions at home: Eating and drinking  If told, follow the DASH eating plan. To follow this plan: Fill one half of your plate at each meal with fruits and vegetables. Fill one fourth of your plate  at each meal with whole grains. Whole grains include whole-wheat pasta, brown rice, and whole-grain bread. Eat or drink low-fat dairy products, such as skim milk or low-fat yogurt. Fill one fourth of your plate at each meal with low-fat (lean) proteins. Low-fat proteins include fish, chicken without skin, eggs, beans, and tofu. Avoid fatty meat, cured and processed meat, or chicken with skin. Avoid pre-made or processed food. Limit the amount of salt in your diet to less than 1,500 mg each day. Do not drink alcohol if: Your doctor tells you not to drink. You are pregnant, may be pregnant, or are planning to become pregnant. If you drink alcohol: Limit how much you have to: 0-1 drink a day for women. 0-2 drinks a day for men. Know how much alcohol is in your drink. In the [place] one drink equals one 12 oz bottle of beer (355 mL), one 5 oz glass of wine (148 mL), or one 1 oz glass of hard liquor (44 mL). Lifestyle  Work with your doctor to stay at a healthy weight or to lose weight. Ask your doctor what the best weight is for you. Get at least 30 minutes of exercise that causes your heart to beat faster (aerobic exercise) most days of the week. This may include walking, swimming, or biking. Get at least 30 minutes of exercise that strengthens your muscles (resistance exercise) at least 3 days a week. This may include lifting weights or doing Pilates. Do not smoke or use any products that contain nicotine or tobacco. If you need help quitting, ask your doctor. Check your blood pressure at home as told by your doctor. Keep all follow-up visits. Medicines Take over-the-counter and prescription medicines  only as told by your doctor. Follow directions carefully. Do not skip doses of blood pressure medicine. The medicine does not work as well if you skip doses. Skipping doses also puts you at risk for problems. Ask your doctor about side effects or reactions to medicines that you should watch  for. Contact a doctor if: You think you are having a reaction to the medicine you are taking. You have headaches that keep coming back. You feel dizzy. You have swelling in your ankles. You have trouble with your vision. Get help right away if: You get a very bad headache. You start to feel mixed up (confused). You feel weak or numb. You feel faint. You have very bad pain in your: Chest. Belly (abdomen). You vomit more than once. You have trouble breathing. These symptoms may be an emergency. Get help right away. Call 911. Do not wait to see if the symptoms will go away. Do not drive yourself to the hospital. Summary Hypertension is another name for high blood pressure. High blood pressure forces your heart to work harder to pump blood. For most people, a normal blood pressure is less than 120/80. Making healthy choices can help lower blood pressure. If your blood pressure does not get lower with healthy choices, you may need to take medicine. This information is not intended to replace advice given to you by your health care provider. Make sure you discuss any questions you have with your health care provider. Document Revised: 05/21/2021 Document Reviewed: 05/21/2021 Elsevier Patient Education  2024 [employer].

## 2024-02-17 LAB — CMP14+EGFR
ALT: 43 IU/L (ref 0–44)
AST: 34 IU/L (ref 0–40)
Albumin: 4.3 g/dL (ref 3.8–4.8)
Alkaline Phosphatase: 74 IU/L (ref 44–121)
BUN/Creatinine Ratio: 25 — ABNORMAL HIGH (ref 10–24)
BUN: 86 mg/dL (ref 8–27)
Bilirubin Total: 0.6 mg/dL (ref 0.0–1.2)
CO2: 24 mmol/L (ref 20–29)
Calcium: 9.6 mg/dL (ref 8.6–10.2)
Chloride: 100 mmol/L (ref 96–106)
Creatinine, Ser: 3.41 mg/dL — ABNORMAL HIGH (ref 0.76–1.27)
Globulin, Total: 3.4 g/dL (ref 1.5–4.5)
Glucose: 92 mg/dL (ref 70–99)
Potassium: 4.1 mmol/L (ref 3.5–5.2)
Sodium: 141 mmol/L (ref 134–144)
Total Protein: 7.7 g/dL (ref 6.0–8.5)
eGFR: 18 mL/min/1.73 — ABNORMAL LOW (ref >59)

## 2024-02-17 LAB — LIPID PANEL
Chol/HDL Ratio: 2.6 ratio (ref 0.0–5.0)
Cholesterol, Total: 108 mg/dL (ref 100–199)
HDL: 42 mg/dL (ref >39)
LDL Chol Calc (NIH): 47 mg/dL (ref 0–99)
Triglycerides: 101 mg/dL (ref 0–149)
VLDL Cholesterol Cal: 19 mg/dL (ref 5–40)

## 2024-02-17 LAB — URIC ACID: Uric Acid: 11.7 mg/dL — ABNORMAL HIGH (ref 3.8–8.4)

## 2024-02-20 ENCOUNTER — Ambulatory Visit: Payer: Self-pay | Admitting: Internal Medicine

## 2024-02-20 NOTE — Assessment & Plan Note (Signed)
 His coronary calcium score is 132. Importance of medication/dietary compliance was stressed to the patient. He will continue with rosuvastatin and ASA daily.

## 2024-02-20 NOTE — Assessment & Plan Note (Signed)
 Chronic, LDL goal is less than 70 due to underlying CAD. Will continue with rosuvastatin 20mg  daily.

## 2024-02-20 NOTE — Assessment & Plan Note (Signed)
 Chronic, well controlled.  He states he is taking hydralazine 100mg  and torsemide daily.  Importance of dietary/medication compliance was discussed with the patient. He is encouraged to bring all meds to each MD appt.

## 2024-02-20 NOTE — Assessment & Plan Note (Addendum)
 Gout history, currently asymptomatic. Off allopurinol  due to refill issue. Dose adjusted to 100 mg due to kidney function. - Check uric acid level today. - Instruct him to pick up 100 mg allopurinol  from the pharmacy. - Recheck uric acid level after one month on 100 mg allopurinol . - Adjust allopurinol  dose based on kidney function and uric acid levels. - He will likely not be able to tolerate allopurinol  dose any higher than 50mg  w/ every other day dosing

## 2024-02-20 NOTE — Assessment & Plan Note (Signed)
 He is followed by Nephrology, he states he was advised his kidney function has improved and dialysis is no longer needed. Will request most recent notes, not available at this time .

## 2024-02-26 NOTE — Assessment & Plan Note (Signed)
 Chronic, he has gained 6lbs since February.  Importance of following recommended low sodium diet and medication compliance was discussed with the patient and his wife in detail.

## 2024-02-26 NOTE — Assessment & Plan Note (Signed)
 He is now off of carvedilol, still no Cardiology f/u since discharge. He has yet to schedule Cardiology evaluation.

## 2024-02-26 NOTE — Assessment & Plan Note (Signed)
 Chronic, he is encouraged to follow low sodium diet, maintain optimal BP and to wear compression hose.

## 2024-02-28 DIAGNOSIS — R809 Proteinuria, unspecified: Secondary | ICD-10-CM | POA: Diagnosis not present

## 2024-02-28 DIAGNOSIS — I129 Hypertensive chronic kidney disease with stage 1 through stage 4 chronic kidney disease, or unspecified chronic kidney disease: Secondary | ICD-10-CM | POA: Diagnosis not present

## 2024-02-28 DIAGNOSIS — N2581 Secondary hyperparathyroidism of renal origin: Secondary | ICD-10-CM | POA: Diagnosis not present

## 2024-02-28 DIAGNOSIS — M109 Gout, unspecified: Secondary | ICD-10-CM | POA: Diagnosis not present

## 2024-02-28 DIAGNOSIS — N179 Acute kidney failure, unspecified: Secondary | ICD-10-CM | POA: Diagnosis not present

## 2024-02-28 DIAGNOSIS — N189 Chronic kidney disease, unspecified: Secondary | ICD-10-CM | POA: Diagnosis not present

## 2024-02-28 DIAGNOSIS — N184 Chronic kidney disease, stage 4 (severe): Secondary | ICD-10-CM | POA: Diagnosis not present

## 2024-02-29 LAB — LAB REPORT - SCANNED
Calcium: 9.4
EGFR: 19
PTH: 319

## 2024-03-08 ENCOUNTER — Other Ambulatory Visit: Payer: Self-pay | Admitting: Internal Medicine

## 2024-03-12 DIAGNOSIS — M65352 Trigger finger, left little finger: Secondary | ICD-10-CM | POA: Diagnosis not present

## 2024-03-12 DIAGNOSIS — R202 Paresthesia of skin: Secondary | ICD-10-CM | POA: Diagnosis not present

## 2024-03-12 DIAGNOSIS — R200 Anesthesia of skin: Secondary | ICD-10-CM | POA: Diagnosis not present

## 2024-03-12 DIAGNOSIS — M65351 Trigger finger, right little finger: Secondary | ICD-10-CM | POA: Diagnosis not present

## 2024-03-12 DIAGNOSIS — M65321 Trigger finger, right index finger: Secondary | ICD-10-CM | POA: Diagnosis not present

## 2024-03-12 NOTE — Progress Notes (Addendum)
 Orthopaedic Surgery Hand and Upper Extremity History and Physical Examination  CC: Follow up bilateral hand stiffness   HPI 03/12/2024: Andrew Vanwart Sr. is a 73 y.o. male dents for follow-up evaluation of bilateral hand stiffness and tingling.  Symptoms are worse in the bilateral small fingers and right ring finger.  He states that the injection in the left small and right ring fingers provided on 02/02/2024 provided significant relief.  His right ring finger symptoms continue to be resolved.  His symptoms have returned in the left small finger and are worsening in the right small finger.  Of note he has a history of kidney disease on dialysis.   Problem List:  Problem List[1]  Past Medical History: Medical History[2]   Medications: Current Rx ordered in [facility][3]  Allergies: Allergies as of 03/12/2024 - Reviewed 03/12/2024  Allergen Reaction Noted  . Ace inhibitors Cough 10/15/2019    Past Surgical History: Surgical History[4]   Social History: Social History   Occupational History  . Not on file  Tobacco Use  . Smoking status: Never  . Smokeless tobacco: Never  Substance and Sexual Activity  . Alcohol use: Yes  . Drug use: Not on file  . Sexual activity: Not on file     Family History: Family History[5] Otherwise, no relevant orthopaedic family history  ROS: Review of Systems: All systems reviewed and are negative except that mentioned in HPI  Work/Sport/Hobbies: See HPI  Physical Examination: Vitals:   03/12/24 0947  BP: 126/76  Pulse: 63  Temp: 97.3 F (36.3 C)   Constitutional: Awake, alert.  WN/WD Appearance: healthy, no acute distress, well-groomed Affect: Normal HEENT: EOMI, mucous membranes moist CV: RRR Pulm: breathing comfortably  Bilateral upper Extremity / Hand Overlying skin is warm dry and intact.  There is no signs of rash irritation or infection.  There is no obvious deformity.  No obvious nail deformities.  Capillary refill is  brisk and skin turgor is appropriate.  Neurovascularly intact.  Can make a composite fist when compared bilaterally.    Tenderness over the A1 pulley of the left and right small fingers.  Sensation: intact to light touch in median, radial and ulnar nerve distributions  Motor: intact in AIN, PIN, and ulnar nerve distributions Vascular: Fingers warm and well perfused, palpable radial pulse   Assessment/Plan: 1.  Bilateral small trigger finger: Left side improved with previous injection although symptoms have returned. - Will repeat left side injection today and do the first injection on the right side.  2.  Right ring trigger finger: Improved with injection - Continue to monitor - Consider repeat injection if symptoms return  3.  Numbness and tingling in the fingers: Chronic - Consider EMG for evaluation of ulnar neuropathy versus peripheral neuropathy - Will hold off on gabapentin given that he has kidney disease and is on dialysis  Follow-up in 6 weeks with Dr. Delene Betty Anton, PA-C [facility] Department of Orthopaedic Surgery 03/12/2024 10:09 AM       [1] Patient Active Problem List Diagnosis  . Trigger index finger of right hand  . Trigger little finger of left hand  [2] Past Medical History: Diagnosis Date  . Gout   . Hyperlipidemia   . Hypertension   . Obesity   [3] Meds Ordered in [facility]  Medication Sig Dispense Refill  . acetaminophen  (TYLENOL ) 500 mg tablet Take 1,000 mg by mouth.    . albuterol  HFA (PROVENTIL  HFA;VENTOLIN  HFA;PROAIR  HFA) 90 mcg/actuation inhaler  Inhale 1-2 puffs.    . allopurinoL  (ZYLOPRIM ) 300 mg tablet Take 1 tablet by mouth Once Daily.    . amLODIPine  (NORVASC ) 10 mg tablet Take 1 tablet by mouth Once Daily.    . aspirin  81 mg EC tablet Take 81 mg by mouth Once Daily.    . benzonatate (TESSALON) 100 mg capsule Take 100 mg by mouth 3 (three) times a day.    . calcium  acetate (PHOSLO ) 667 mg (169 mg  calcium ) capsule Take 667 mg by mouth 3 (three) times a day.    . carvediloL (COREG) 6.25 mg tablet Take 1 tablet by mouth.    . docusate sodium (COLACE) 100 mg capsule Take 100 mg by mouth.    . finasteride  (PROSCAR ) 5 mg tablet Take 5 mg by mouth Once Daily.    . furosemide  (LASIX ) 20 mg tablet Take 1 tablet by mouth Once Daily.    . hydrALAZINE  (APRESOLINE ) 100 mg tablet Take 100 mg by mouth.    SABRA ibuprofen (MOTRIN) 200 mg tablet Take 200 mg by mouth.    . rosuvastatin  (CRESTOR ) 10 mg tablet Take 1 tablet by mouth.    . tamsulosin  (FLOMAX ) 0.4 mg cap Take by mouth.    . torsemide  40 mg tab Take 1 tablet by mouth.     No current Epic-ordered facility-administered medications on file.  [4] No past surgical history on file. [5] Family History Problem Relation Name Age of Onset  . No Known Problems Mother    . No Known Problems Father    . No Known Problems Sister    . No Known Problems Brother    . No Known Problems Maternal Aunt    . No Known Problems Maternal Uncle    . No Known Problems Paternal Aunt    . No Known Problems Paternal Uncle    . No Known Problems Maternal Grandmother    . No Known Problems Maternal Grandfather    . No Known Problems Paternal Grandmother    . No Known Problems Paternal Grandfather    . No Known Problems Other    . Anesthesia problems Neg Hx    . Arthritis Neg Hx    . Asthma Neg Hx    . Cancer Neg Hx    . Cerebral palsy Neg Hx    . Clotting disorder Neg Hx    . Club foot Neg Hx    . Collagen disease Neg Hx    . Deep vein thrombosis Neg Hx    . Diabetes Neg Hx    . Gait disorder Neg Hx    . Gout Neg Hx    . Heart disease Neg Hx    . Hip dysplasia Neg Hx    . Hip fracture Neg Hx    . Hypermobility Neg Hx    . Hypertension Neg Hx    . Osteoporosis Neg Hx    . Other Neg Hx    . Pulmonary disease Neg Hx    . Rheumatologic disease Neg Hx    . Scoliosis Neg Hx    . Spina bifida Neg Hx    . Stroke Neg Hx    . Thyroid disease Neg Hx    .  Vasculitis Neg Hx

## 2024-03-15 ENCOUNTER — Other Ambulatory Visit

## 2024-03-15 DIAGNOSIS — Z79899 Other long term (current) drug therapy: Secondary | ICD-10-CM | POA: Diagnosis not present

## 2024-03-15 DIAGNOSIS — M1A3790 Chronic gout due to renal impairment, unspecified ankle and foot, without tophus (tophi): Secondary | ICD-10-CM | POA: Diagnosis not present

## 2024-03-16 ENCOUNTER — Ambulatory Visit (HOSPITAL_COMMUNITY)
Admission: RE | Admit: 2024-03-16 | Discharge: 2024-03-16 | Disposition: A | Discharge status: Home / Self Care | Source: Ambulatory Visit | Attending: Cardiology | Admitting: Cardiology

## 2024-03-16 VITALS — BP 138/70 | HR 68 | Wt 220.6 lb

## 2024-03-16 DIAGNOSIS — I2510 Atherosclerotic heart disease of native coronary artery without angina pectoris: Secondary | ICD-10-CM | POA: Diagnosis not present

## 2024-03-16 DIAGNOSIS — I5032 Chronic diastolic (congestive) heart failure: Secondary | ICD-10-CM | POA: Diagnosis not present

## 2024-03-16 DIAGNOSIS — N184 Chronic kidney disease, stage 4 (severe): Secondary | ICD-10-CM | POA: Insufficient documentation

## 2024-03-16 DIAGNOSIS — Z79899 Other long term (current) drug therapy: Secondary | ICD-10-CM | POA: Insufficient documentation

## 2024-03-16 DIAGNOSIS — I130 Hypertensive heart and chronic kidney disease with heart failure and stage 1 through stage 4 chronic kidney disease, or unspecified chronic kidney disease: Secondary | ICD-10-CM | POA: Insufficient documentation

## 2024-03-16 DIAGNOSIS — Z7982 Long term (current) use of aspirin: Secondary | ICD-10-CM | POA: Insufficient documentation

## 2024-03-16 DIAGNOSIS — I132 Hypertensive heart and chronic kidney disease with heart failure and with stage 5 chronic kidney disease, or end stage renal disease: Secondary | ICD-10-CM | POA: Diagnosis present

## 2024-03-16 LAB — BMP8+EGFR
BUN/Creatinine Ratio: 27 — ABNORMAL HIGH (ref 10–24)
BUN: 82 mg/dL (ref 8–27)
CO2: 22 mmol/L (ref 20–29)
Calcium: 9.5 mg/dL (ref 8.6–10.2)
Chloride: 103 mmol/L (ref 96–106)
Creatinine, Ser: 3.05 mg/dL — ABNORMAL HIGH (ref 0.76–1.27)
Glucose: 93 mg/dL (ref 70–99)
Potassium: 3.8 mmol/L (ref 3.5–5.2)
Sodium: 141 mmol/L (ref 134–144)
eGFR: 21 mL/min/1.73 — ABNORMAL LOW (ref >59)

## 2024-03-16 LAB — URIC ACID: Uric Acid: 9.1 mg/dL — ABNORMAL HIGH (ref 3.8–8.4)

## 2024-03-16 NOTE — Progress Notes (Signed)
 TTR genetic testing collected via blood per Dr Elwyn Lade.  Order form completed, signed and shipped with sample by Fed[employer] to [facility].

## 2024-03-16 NOTE — Patient Instructions (Signed)
 There has been no changes to your medications.  Labs done today, your results will be available in MyChart, we will contact you for abnormal readings.  Genetic testing has been collected, this has to be sent to [place]  for processing and can take 1-2 weeks for us  to get results back.  We will let you know the results once reviewed by your provider.  You have been ordered a PYP Scan.  This is done at [facility], they will call you to schedule.  When you come for this test please plan to be there 2-3 hours.  They are located at: [address] [place], [place] [address].  Your physician recommends that you schedule a follow-up appointment in: 3 months ( November) ** PLEASE CALL THE OFFICE IN SEPTEMBER TO ARRANGE YOUR FOLLOW UP APPOINTMENT.**  If you have any questions or concerns before your next appointment please send us  a message through [place] or call our office at [phone].    TO LEAVE A MESSAGE FOR THE NURSE SELECT OPTION 2, PLEASE LEAVE A MESSAGE INCLUDING: YOUR NAME DATE OF BIRTH CALL BACK NUMBER REASON FOR CALL**this is important as we prioritize the call backs  YOU WILL RECEIVE A CALL BACK THE SAME DAY AS LONG AS YOU CALL BEFORE 4:00 PM  At the [facility], you and your health needs are our priority. As part of our continuing mission to provide you with exceptional heart care, we have created designated Provider Care Teams. These Care Teams include your primary Cardiologist (physician) and Advanced Practice Providers (APPs- Physician Assistants and Nurse Practitioners) who all work together to provide you with the care you need, when you need it.   You may see any of the following providers on your designated Care Team at your next follow up: Dr Toribio Fuel Dr Ezra Shuck Dr. Ria Commander Dr. Morene Brownie Amy Lenetta, NP Caffie Shed, [place] [facility] [place], [place] Beckey Coe, NP [place] Lee, NP Ellouise Class, NP Tinnie Redman,  PharmD Jaun Bash, PharmD   Please be sure to bring in all your medications bottles to every appointment.    Thank you for choosing [facility]

## 2024-03-16 NOTE — Progress Notes (Signed)
   ADVANCED HEART FAILURE NEW PATIENT CLINIC NOTE  Referring Physician: Jarold Medici, MD  Primary Care: Jarold Medici, MD Primary Cardiologist:  HPI: Andrew Schmitt is a 73 y.o. male with a PMH of CKD IV, HFpEF, HTN who presents for initial visit for further evaluation and treatment of heart failure/cardiomyopathy.      Patient with a longstanding history of hypertension..  Previously evaluated in cardiology clinic for dyspnea on exertion, echocardiogram with grade 2 diastolic dysfunction.  Workup for secondary causes of hypertension was negative, improved somewhat with aggressive blood pressure control.  He was hospitalized in late 2024 for acute on chronic respiratory failure as well as acute renal failure requiring temporary dialysis.  He improved on short-term hemodialysis and his renal function has since improved.  He was referred to heart failure clinic given history of heart failure with preserved ejection fraction.     SUBJECTIVE:  Patient overall reports that he has done fairly well.  He has a longstanding history of heart failure but is off dialysis and is responding fairly well to diuretics.  He reports that his fluid status has improved.  He does have a history of potential carpal tunnel syndrome as well as peripheral neuropathy that is worsened in the past few months.  Denies any orthostatic symptoms or history of spinal stenosis.  Has 2 daughters that are physicians, the [place] area.  PMH, current medications, allergies, social history, and family history reviewed in epic.  PHYSICAL EXAM: Vitals:   03/16/24 0949  BP: 138/70  Pulse: 68  SpO2: 98%   GENERAL: Well nourished and in no apparent distress at rest.  PULM:  Normal work of breathing, clear to auscultation bilaterally. Respirations are unlabored.  CARDIAC:  JVP: Mildly elevated         Normal rate with regular rhythm. No murmurs, rubs or gallops.  Trace edema. Warm and well perfused extremities. ABDOMEN:  Soft, non-tender, non-distended. NEUROLOGIC: Patient is oriented x3 with no focal or lateralizing neurologic deficits.    DATA REVIEW  ECG: 03/16/2024: Sinus bradycardia, rightward axis, low voltage EKG  ECHO: 07/2023: LVEF 65%, moderate LVH, normal RV function, left atrium moderately dilated  CATH: None   ASSESSMENT & PLAN:  Chronic heart failure with preserved ejection fraction: Suspected due to hypertension, though patient has multiple features concerning for hATTR amyloid including carpal tunnel syndrome, low voltage EKG, pseudo anterior infarct pattern, LVH with low E prime on echo.  Unfortunately strain has never been completed on his echocardiograms.  We discussed diagnostic modalities, PYP scan should be back up and running soon. - No indication for biopsy given stable NYHA class II symptoms - Genetic testing, AL amyloid rule out, PYP study ordered - Would repeat echocardiogram next time with strain - Continue torsemide  100 mg daily, appears euvolemic - Taking hydralazine  100 mg daily, blood pressure controlled but may need to adjust at next visit - Restart farxiga at next visit  Hypertension: - Reports control at home. - Continue current regimen for now  CKD Stage IV: Previously on iHD, now with some renal recovery. - No ARB/ARNI, SGLT2 at next visit  CAC:  - Continue atorvastatin, aspirin   Follow up in 3 months  Morene Brownie, MD Advanced Heart Failure Mechanical Circulatory Support 03/21/24

## 2024-03-18 ENCOUNTER — Ambulatory Visit: Payer: Self-pay | Admitting: Internal Medicine

## 2024-03-22 DIAGNOSIS — D3500 Benign neoplasm of unspecified adrenal gland: Secondary | ICD-10-CM | POA: Diagnosis not present

## 2024-03-28 ENCOUNTER — Telehealth (HOSPITAL_COMMUNITY): Payer: Self-pay | Admitting: *Deleted

## 2024-03-28 NOTE — Telephone Encounter (Signed)
 Left instructions for Amyloid Study on VM.

## 2024-04-03 ENCOUNTER — Encounter: Payer: Self-pay | Admitting: Urology

## 2024-04-03 ENCOUNTER — Other Ambulatory Visit: Payer: Self-pay | Admitting: Urology

## 2024-04-03 DIAGNOSIS — D3500 Benign neoplasm of unspecified adrenal gland: Secondary | ICD-10-CM

## 2024-04-04 ENCOUNTER — Other Ambulatory Visit (HOSPITAL_COMMUNITY): Payer: Self-pay | Admitting: Cardiology

## 2024-04-04 DIAGNOSIS — I5032 Chronic diastolic (congestive) heart failure: Secondary | ICD-10-CM

## 2024-04-05 ENCOUNTER — Ambulatory Visit (HOSPITAL_COMMUNITY)
Admission: RE | Admit: 2024-04-05 | Discharge: 2024-04-05 | Disposition: A | Discharge status: Home / Self Care | Source: Ambulatory Visit | Attending: Cardiology | Admitting: Cardiology

## 2024-04-05 DIAGNOSIS — I5032 Chronic diastolic (congestive) heart failure: Secondary | ICD-10-CM | POA: Diagnosis not present

## 2024-04-05 MED ORDER — TECHNETIUM TC 99M PYROPHOSPHATE
21.2000 | Freq: Once | INTRAVENOUS | Status: AC
  Administered 2024-04-05: 21.2 via INTRAVENOUS

## 2024-04-06 ENCOUNTER — Ambulatory Visit (HOSPITAL_COMMUNITY): Payer: Self-pay | Admitting: Cardiology

## 2024-04-10 ENCOUNTER — Other Ambulatory Visit (HOSPITAL_COMMUNITY): Payer: Self-pay | Admitting: Cardiology

## 2024-04-10 DIAGNOSIS — N184 Chronic kidney disease, stage 4 (severe): Secondary | ICD-10-CM | POA: Diagnosis not present

## 2024-04-10 DIAGNOSIS — I5030 Unspecified diastolic (congestive) heart failure: Secondary | ICD-10-CM | POA: Diagnosis not present

## 2024-04-10 DIAGNOSIS — I130 Hypertensive heart and chronic kidney disease with heart failure and stage 1 through stage 4 chronic kidney disease, or unspecified chronic kidney disease: Secondary | ICD-10-CM | POA: Diagnosis not present

## 2024-04-10 DIAGNOSIS — N2581 Secondary hyperparathyroidism of renal origin: Secondary | ICD-10-CM | POA: Diagnosis not present

## 2024-04-10 DIAGNOSIS — R319 Hematuria, unspecified: Secondary | ICD-10-CM | POA: Diagnosis not present

## 2024-04-10 LAB — LAB REPORT - SCANNED: EGFR: 20

## 2024-04-12 ENCOUNTER — Telehealth (HOSPITAL_COMMUNITY): Payer: Self-pay | Admitting: Cardiology

## 2024-04-12 NOTE — Telephone Encounter (Signed)
 Called to confirm/remind patient of their appointment at the [facility] on 04/12/2024.   Appointment:   [x] Confirmed  [] Left mess   [] No answer/No voice mail  [] VM Full/unable to leave message  [] Phone not in service  Patient reminded to bring all medications and/or complete list.  Confirmed patient has transportation. Gave directions, instructed to utilize valet parking.

## 2024-04-13 ENCOUNTER — Other Ambulatory Visit (HOSPITAL_COMMUNITY): Payer: Self-pay

## 2024-04-13 ENCOUNTER — Ambulatory Visit (HOSPITAL_COMMUNITY)
Admission: RE | Admit: 2024-04-13 | Discharge: 2024-04-13 | Disposition: A | Discharge status: Home / Self Care | Source: Ambulatory Visit | Attending: Cardiology | Admitting: Cardiology

## 2024-04-13 VITALS — BP 148/90 | HR 70 | Wt 225.0 lb

## 2024-04-13 DIAGNOSIS — Z79899 Other long term (current) drug therapy: Secondary | ICD-10-CM | POA: Insufficient documentation

## 2024-04-13 DIAGNOSIS — N184 Chronic kidney disease, stage 4 (severe): Secondary | ICD-10-CM | POA: Insufficient documentation

## 2024-04-13 DIAGNOSIS — Z7982 Long term (current) use of aspirin: Secondary | ICD-10-CM | POA: Diagnosis not present

## 2024-04-13 DIAGNOSIS — E854 Organ-limited amyloidosis: Secondary | ICD-10-CM | POA: Insufficient documentation

## 2024-04-13 DIAGNOSIS — I130 Hypertensive heart and chronic kidney disease with heart failure and stage 1 through stage 4 chronic kidney disease, or unspecified chronic kidney disease: Secondary | ICD-10-CM | POA: Diagnosis not present

## 2024-04-13 DIAGNOSIS — I5032 Chronic diastolic (congestive) heart failure: Secondary | ICD-10-CM | POA: Insufficient documentation

## 2024-04-13 DIAGNOSIS — I43 Cardiomyopathy in diseases classified elsewhere: Secondary | ICD-10-CM | POA: Insufficient documentation

## 2024-04-13 DIAGNOSIS — G629 Polyneuropathy, unspecified: Secondary | ICD-10-CM | POA: Insufficient documentation

## 2024-04-13 NOTE — Patient Instructions (Signed)
 You will be called about your new medication Amvuttra once it has been approved.  Labs done today, your results will be available in MyChart, we will contact you for abnormal readings.  Your physician recommends that you schedule a follow-up appointment in: 4 months ( December) ** PLEASE CALL THE OFFICE IN OCTOBER TO ARRANGE YOUR FOLLOW UP APPOINTMENT.**  If you have any questions or concerns before your next appointment please send us  a message through [place] or call our office at [phone].    TO LEAVE A MESSAGE FOR THE NURSE SELECT OPTION 2, PLEASE LEAVE A MESSAGE INCLUDING: YOUR NAME DATE OF BIRTH CALL BACK NUMBER REASON FOR CALL**this is important as we prioritize the call backs  YOU WILL RECEIVE A CALL BACK THE SAME DAY AS LONG AS YOU CALL BEFORE 4:00 PM  At the [facility], you and your health needs are our priority. As part of our continuing mission to provide you with exceptional heart care, we have created designated Provider Care Teams. These Care Teams include your primary Cardiologist (physician) and Advanced Practice Providers (APPs- Physician Assistants and Nurse Practitioners) who all work together to provide you with the care you need, when you need it.   You may see any of the following providers on your designated Care Team at your next follow up: Dr Toribio Fuel Dr Ezra Shuck Dr. Ria Commander Dr. Morene Brownie Amy Lenetta, NP Caffie Shed, [place] [facility] [place], [place] Beckey Coe, NP [place] Lee, NP Ellouise Class, NP Tinnie Redman, PharmD Jaun Bash, PharmD   Please be sure to bring in all your medications bottles to every appointment.    Thank you for choosing [facility]

## 2024-04-17 ENCOUNTER — Other Ambulatory Visit: Payer: Self-pay

## 2024-04-17 ENCOUNTER — Telehealth (HOSPITAL_COMMUNITY): Payer: Self-pay | Admitting: Pharmacist

## 2024-04-17 ENCOUNTER — Encounter (HOSPITAL_COMMUNITY): Payer: Self-pay

## 2024-04-17 ENCOUNTER — Other Ambulatory Visit (HOSPITAL_COMMUNITY): Payer: Self-pay

## 2024-04-17 ENCOUNTER — Telehealth (HOSPITAL_COMMUNITY): Payer: Self-pay

## 2024-04-17 LAB — KAPPA/LAMBDA LIGHT CHAINS
Kappa free light chain: 118.2 mg/L — ABNORMAL HIGH (ref 3.3–19.4)
Kappa, lambda light chain ratio: 1.63 (ref 0.26–1.65)
Lambda free light chains: 72.4 mg/L — ABNORMAL HIGH (ref 5.7–26.3)

## 2024-04-17 MED ORDER — AMVUTTRA 25 MG/0.5ML ~~LOC~~ SOSY
25.0000 mg | PREFILLED_SYRINGE | SUBCUTANEOUS | 3 refills | Status: AC
  Filled 2024-04-17: qty 0.5, 90d supply, fill #0
  Filled 2024-07-10: qty 0.5, 90d supply, fill #1

## 2024-04-17 NOTE — Telephone Encounter (Signed)
 Advanced Heart Failure Patient Advocate Encounter  The patient was approved for a [employer] grant that will help cover the cost of Amvuttra .  Total amount awarded, $8,000.  Effective: 03/18/2024 - 03/17/2025.  BIN [id number] PCN [id number] [id number] ID [id number]  Pharmacy provided with approval and processing information. Patient informed while in office.  Rachel DEL, CPhT Rx Patient Advocate Phone: [phone]

## 2024-04-17 NOTE — Progress Notes (Signed)
 Specialty Pharmacy Initiation Note   Andrew Schmitt is a 73 y.o. male who will be followed by the specialty pharmacy service for RxSp Cardiology    Review of administration, indication, effectiveness, safety, potential side effects, storage/disposable, and missed dose instructions occurred today for patient's specialty medication(s) Vutrisiran  Sodium (Amvuttra )     Patient/Caregiver did not have any additional questions or concerns.   Patient's therapy is appropriate to: Initiate    Goals Addressed             This Visit's Progress    Slow Disease Progression       Patient is initiating therapy. Patient will maintain adherence         Kruz Chiu CHRISTELLA Redman Specialty Pharmacist

## 2024-04-17 NOTE — Progress Notes (Signed)
   ADVANCED HEART FAILURE FOLLOW UP CLINIC NOTE  Referring Physician: Jarold Medici, MD  Primary Care: Jarold Medici, MD Primary Cardiologist:  HPI: Andrew Schmitt is a 73 y.o. 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      Patient with a longstanding history of hypertension..  Previously evaluated in cardiology clinic for dyspnea on exertion, echocardiogram with grade 2 diastolic dysfunction.  Workup for secondary causes of hypertension was negative, improved somewhat with aggressive blood pressure control.   He was hospitalized in late 2024 for acute on chronic respiratory failure as well as acute renal failure requiring temporary dialysis.  He improved on short-term hemodialysis and his renal function has since improved.  He was referred to heart failure clinic given history of heart failure with preserved ejection fraction.     SUBJECTIVE:  Discussed recent test results with patient and daughter, who is a physician in [place] , over the phone.  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 We discussed his genetic testing results and recommendation for testing of first-degree relatives and consideration of cardiac monitoring based on results.  We discussed potential therapeutic options.  As we discussed at his last visit, he does have symptoms of peripheral neuropathy that have worsened over the past few months.  They are not extremely limiting, and he has been able to complete his activities of daily living despite them.  PMH, current medications, allergies, social history, and family history reviewed in epic.  PHYSICAL EXAM: Vitals:   04/13/24 1136  BP: (!) 148/90  Pulse: 70  SpO2: 97%   GENERAL: Well nourished and in no apparent distress at rest.  PULM:  Normal work of breathing, clear to auscultation bilaterally. Respirations are unlabored.  CARDIAC:  JVP: flat          Normal rate with regular rhythm. No murmurs, rubs or gallops.  No edema. Warm and well perfused extremities. ABDOMEN: Soft, non-tender, non-distended. NEUROLOGIC: Patient is oriented x3 with no focal or lateralizing neurologic deficits.    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 Will plan for silencer therapy with amvuttra  given his concomitant polyneuropathy.   - Euvolemic, stable NYHA class II symptoms - Genetic testing positive - Echo to be ordered at next visit - Start amvuttra , discussed with pharmacy - Continue torsemide  100mg  daily, consider decrease at next visit - Continue hydralazine  100mg  daily, patient reports better control at home - Start jardiance at next visit  Hypertension: - Reports control at home. - Continue current regimen for now   CKD Stage IV: Previously on iHD, now with some renal recovery. - No ARB/ARNI, needs SGLT2   CAC:  - Continue atorvastatin, aspirin   Follow up in 2-3 months  Morene Brownie, MD Advanced Heart Failure Mechanical Circulatory Support 04/17/24

## 2024-04-17 NOTE — Progress Notes (Signed)
 Specialty Pharmacy Initial Fill Coordination Note  Andrew Schmitt is a 73 y.o. male contacted today regarding initial fill of specialty medication(s) Vutrisiran  Sodium (Amvuttra )   Patient requested Courier to Provider Office   Delivery date: 04/20/24   Verified address: [facility] [address] [place] [address]   Medication will be filled on 04/20/2024.   Patient is aware of $0 copayment.

## 2024-04-17 NOTE — Telephone Encounter (Signed)
 Advanced Heart Failure Patient Advocate Encounter  Prior Authorization for Amvuttra  has been approved.    Effective dates: 08/17/23 through 08/15/24  Patients co-pay is $300; Patient may qualify for grant, will attempt to submit.   Patient scheduled for first injection 04/23/24.   Tinnie Redman, PharmD, BCPS, BCCP, CPP Heart Failure Clinic Pharmacist [phone]

## 2024-04-17 NOTE — Telephone Encounter (Signed)
 Patient Advocate Encounter   Received notification from [facility] that prior authorization for Amvuttra  is required.   PA submitted on CoverMyMeds Key [id number] Status is pending   Will continue to follow.   Tinnie Redman, PharmD, BCPS, BCCP, CPP [facility] Clinic Pharmacist [phone]

## 2024-04-18 ENCOUNTER — Ambulatory Visit (HOSPITAL_COMMUNITY): Payer: Self-pay | Admitting: Cardiology

## 2024-04-18 ENCOUNTER — Other Ambulatory Visit: Payer: Self-pay

## 2024-04-18 LAB — MULTIPLE MYELOMA PANEL, SERUM
Albumin SerPl Elph-Mcnc: 3.8 g/dL (ref 2.9–4.4)
Albumin/Glob SerPl: 1.1 (ref 0.7–1.7)
Alpha 1: 0.3 g/dL (ref 0.0–0.4)
Alpha2 Glob SerPl Elph-Mcnc: 0.7 g/dL (ref 0.4–1.0)
B-Globulin SerPl Elph-Mcnc: 1 g/dL (ref 0.7–1.3)
Gamma Glob SerPl Elph-Mcnc: 1.9 g/dL — ABNORMAL HIGH (ref 0.4–1.8)
Globulin, Total: 3.8 g/dL (ref 2.2–3.9)
IgA: 173 mg/dL (ref 61–437)
IgG (Immunoglobin G), Serum: 2144 mg/dL — ABNORMAL HIGH (ref 603–1613)
IgM (Immunoglobulin M), Srm: 172 mg/dL — ABNORMAL HIGH (ref 15–143)
Total Protein ELP: 7.6 g/dL (ref 6.0–8.5)

## 2024-04-18 LAB — IMMUNOFIXATION, URINE

## 2024-04-23 ENCOUNTER — Ambulatory Visit (HOSPITAL_COMMUNITY)
Admission: RE | Admit: 2024-04-23 | Discharge: 2024-04-23 | Disposition: A | Discharge status: Home / Self Care | Source: Ambulatory Visit | Attending: Cardiology | Admitting: Cardiology

## 2024-04-23 DIAGNOSIS — G63 Polyneuropathy in diseases classified elsewhere: Secondary | ICD-10-CM

## 2024-04-23 DIAGNOSIS — I43 Cardiomyopathy in diseases classified elsewhere: Secondary | ICD-10-CM | POA: Insufficient documentation

## 2024-04-23 DIAGNOSIS — E851 Neuropathic heredofamilial amyloidosis: Secondary | ICD-10-CM

## 2024-04-23 DIAGNOSIS — E854 Organ-limited amyloidosis: Secondary | ICD-10-CM | POA: Diagnosis not present

## 2024-04-23 DIAGNOSIS — I110 Hypertensive heart disease with heart failure: Secondary | ICD-10-CM | POA: Diagnosis not present

## 2024-04-23 DIAGNOSIS — I5032 Chronic diastolic (congestive) heart failure: Secondary | ICD-10-CM | POA: Diagnosis not present

## 2024-04-23 MED ORDER — VUTRISIRAN SODIUM 25 MG/0.5ML ~~LOC~~ SOSY
25.0000 mg | PREFILLED_SYRINGE | Freq: Once | SUBCUTANEOUS | Status: AC
  Administered 2024-04-23: 25 mg via SUBCUTANEOUS

## 2024-04-23 NOTE — Patient Instructions (Signed)
 It was a pleasure seeing you today!  MEDICATIONS: -We are changing your medications today -Start vitamin A 2500-3000 IU (700-900 mcg) of vitamin A daily. Amvuttra  decreases serum vitamin A levels. This can usually be found in a multivitamin, or you can purchase a separate supplement over the counter.   -Call if you have questions about your medications.   NEXT APPOINTMENT: Return to clinic in 3 months for repeat injection.   Call the clinic at [phone] with questions or to reschedule future appointments.

## 2024-04-23 NOTE — Progress Notes (Signed)
     Advanced Heart Failure Clinic Note   Referring Physician: Jarold Medici, MD  Primary Care: Jarold Medici, MD HF Cardiologist: Dr. Zenaida    HPI:  Andrew Schmitt is a 73 y.o. 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   Patient with a longstanding history of hypertension..  Previously evaluated in cardiology clinic for dyspnea on exertion, echocardiogram with grade 2 diastolic dysfunction.  Workup for secondary causes of hypertension was negative, improved somewhat with aggressive blood pressure control.   He was hospitalized in late 2024 for acute on chronic respiratory failure as well as acute renal failure requiring temporary dialysis.  He improved on short-term hemodialysis and his renal function has since improved.  He was referred to heart failure clinic given history of heart failure with preserved ejection fraction.  Presented to AHF Clinic with Dr. Zenaida on 05/14/24. Discussed recent test results with patient and daughter, who is a physician in [place] , over the phone.  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 We discussed his genetic testing results and recommendation for testing of first-degree relatives and consideration of cardiac monitoring based on results.  We discussed potential therapeutic options.  As we discussed at his last visit, he does have symptoms of peripheral neuropathy that have worsened over the past few months.  They are not extremely limiting, and he had been able to complete his activities of daily living despite them. He was then referred to pharmacy clinic for administration of Amvuttra .      Today he presents to HF clinic for administration of Amvuttra . This is his first injection. Injection was administered in his left arm and was tolerated well.    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 Referred for TTR silencer therapy with Amvuttra  given his concomitant polyneuropathy.   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 Patient tolerated injection well. Provided patient counseling on Amvuttra . Most common side effects are injection site reactions, arthralgias and dyspnea. Patient is aware to return to clinic every 3 months for repeat injection. Amvuttra  will be obtained from [facility] and couriered to clinic for injection - Patient will need at least 2500-3000 IU (700-900 mcg) of vitamin A daily. Amvuttra  decreases serum vitamin A levels. This can usually be found in a MVI. Patient plans to pick up after visit.     Follow up 3 months for repeat Amvuttra  injection.    Tinnie Redman, PharmD, BCPS, BCCP, CPP Heart Failure Clinic Pharmacist [phone]

## 2024-04-27 ENCOUNTER — Ambulatory Visit
Admission: RE | Admit: 2024-04-27 | Discharge: 2024-04-27 | Disposition: A | Discharge status: Home / Self Care | Source: Ambulatory Visit | Attending: Urology | Admitting: Urology

## 2024-04-27 DIAGNOSIS — K5730 Diverticulosis of large intestine without perforation or abscess without bleeding: Secondary | ICD-10-CM | POA: Diagnosis not present

## 2024-04-27 DIAGNOSIS — D3500 Benign neoplasm of unspecified adrenal gland: Secondary | ICD-10-CM

## 2024-04-27 MED ORDER — GADOPICLENOL 0.5 MMOL/ML IV SOLN
10.0000 mL | Freq: Once | INTRAVENOUS | Status: AC | PRN
Start: 2024-04-27 — End: 2024-04-27
  Administered 2024-04-27: 10 mL via INTRAVENOUS

## 2024-04-28 ENCOUNTER — Encounter (HOSPITAL_COMMUNITY): Payer: Self-pay

## 2024-04-28 ENCOUNTER — Other Ambulatory Visit: Payer: Self-pay | Admitting: Internal Medicine

## 2024-05-10 ENCOUNTER — Encounter: Payer: Self-pay | Admitting: Internal Medicine

## 2024-05-10 DIAGNOSIS — D4101 Neoplasm of uncertain behavior of right kidney: Secondary | ICD-10-CM | POA: Diagnosis not present

## 2024-05-10 DIAGNOSIS — Z860101 Personal history of adenomatous and serrated colon polyps: Secondary | ICD-10-CM | POA: Diagnosis not present

## 2024-05-10 DIAGNOSIS — N186 End stage renal disease: Secondary | ICD-10-CM | POA: Diagnosis not present

## 2024-05-10 DIAGNOSIS — R350 Frequency of micturition: Secondary | ICD-10-CM | POA: Diagnosis not present

## 2024-05-10 DIAGNOSIS — R3915 Urgency of urination: Secondary | ICD-10-CM | POA: Diagnosis not present

## 2024-05-10 DIAGNOSIS — R351 Nocturia: Secondary | ICD-10-CM | POA: Diagnosis not present

## 2024-05-10 DIAGNOSIS — Z8679 Personal history of other diseases of the circulatory system: Secondary | ICD-10-CM | POA: Diagnosis not present

## 2024-05-11 ENCOUNTER — Other Ambulatory Visit: Payer: Self-pay | Admitting: Internal Medicine

## 2024-05-16 ENCOUNTER — Other Ambulatory Visit: Payer: Self-pay | Admitting: Internal Medicine

## 2024-05-16 DIAGNOSIS — J90 Pleural effusion, not elsewhere classified: Secondary | ICD-10-CM

## 2024-05-22 ENCOUNTER — Other Ambulatory Visit: Payer: Self-pay

## 2024-06-04 DIAGNOSIS — N184 Chronic kidney disease, stage 4 (severe): Secondary | ICD-10-CM | POA: Diagnosis not present

## 2024-06-11 DIAGNOSIS — I5030 Unspecified diastolic (congestive) heart failure: Secondary | ICD-10-CM | POA: Diagnosis not present

## 2024-06-11 DIAGNOSIS — D631 Anemia in chronic kidney disease: Secondary | ICD-10-CM | POA: Diagnosis not present

## 2024-06-11 DIAGNOSIS — N2581 Secondary hyperparathyroidism of renal origin: Secondary | ICD-10-CM | POA: Diagnosis not present

## 2024-06-11 DIAGNOSIS — I130 Hypertensive heart and chronic kidney disease with heart failure and stage 1 through stage 4 chronic kidney disease, or unspecified chronic kidney disease: Secondary | ICD-10-CM | POA: Diagnosis not present

## 2024-06-11 DIAGNOSIS — R319 Hematuria, unspecified: Secondary | ICD-10-CM | POA: Diagnosis not present

## 2024-06-11 DIAGNOSIS — N184 Chronic kidney disease, stage 4 (severe): Secondary | ICD-10-CM | POA: Diagnosis not present

## 2024-06-18 ENCOUNTER — Encounter: Payer: Self-pay | Admitting: Radiology

## 2024-06-21 DIAGNOSIS — I429 Cardiomyopathy, unspecified: Secondary | ICD-10-CM | POA: Diagnosis not present

## 2024-06-21 DIAGNOSIS — I4891 Unspecified atrial fibrillation: Secondary | ICD-10-CM | POA: Diagnosis not present

## 2024-06-21 DIAGNOSIS — J4489 Other specified chronic obstructive pulmonary disease: Secondary | ICD-10-CM | POA: Diagnosis not present

## 2024-06-21 DIAGNOSIS — I739 Peripheral vascular disease, unspecified: Secondary | ICD-10-CM | POA: Diagnosis not present

## 2024-06-21 DIAGNOSIS — I700 Atherosclerosis of aorta: Secondary | ICD-10-CM | POA: Diagnosis not present

## 2024-06-21 DIAGNOSIS — N184 Chronic kidney disease, stage 4 (severe): Secondary | ICD-10-CM | POA: Diagnosis not present

## 2024-06-21 DIAGNOSIS — I719 Aortic aneurysm of unspecified site, without rupture: Secondary | ICD-10-CM | POA: Diagnosis not present

## 2024-06-21 DIAGNOSIS — I509 Heart failure, unspecified: Secondary | ICD-10-CM | POA: Diagnosis not present

## 2024-06-21 DIAGNOSIS — I130 Hypertensive heart and chronic kidney disease with heart failure and stage 1 through stage 4 chronic kidney disease, or unspecified chronic kidney disease: Secondary | ICD-10-CM | POA: Diagnosis not present

## 2024-06-27 ENCOUNTER — Ambulatory Visit (INDEPENDENT_AMBULATORY_CARE_PROVIDER_SITE_OTHER)

## 2024-06-27 ENCOUNTER — Ambulatory Visit: Admitting: Pulmonary Disease

## 2024-06-27 ENCOUNTER — Encounter: Payer: Self-pay | Admitting: Pulmonary Disease

## 2024-06-27 VITALS — BP 130/82 | HR 40 | Ht 66.0 in | Wt 221.0 lb

## 2024-06-27 DIAGNOSIS — R0609 Other forms of dyspnea: Secondary | ICD-10-CM

## 2024-06-27 DIAGNOSIS — J90 Pleural effusion, not elsewhere classified: Secondary | ICD-10-CM

## 2024-06-27 DIAGNOSIS — J45909 Unspecified asthma, uncomplicated: Secondary | ICD-10-CM | POA: Diagnosis not present

## 2024-06-27 DIAGNOSIS — J4540 Moderate persistent asthma, uncomplicated: Secondary | ICD-10-CM

## 2024-06-27 MED ORDER — FLUTICASONE-SALMETEROL 250-50 MCG/ACT IN AEPB: 1.0000 | INHALATION_SPRAY | Freq: Two times a day (BID) | RESPIRATORY_TRACT | 3 refills | Status: AC

## 2024-06-27 NOTE — Progress Notes (Signed)
 @Patient  ID: Andrew Schmitt, male    DOB: [birth year], 73 y.o.   MRN: [id number]  Chief Complaint  Patient presents with   Consult    Pt states MD wanted him to be seen    Referring provider: Jarold Medici, MD  HPI:   73 y.o. man whom we are seeing for evaluation of pleural effusion.  Chief complaint is dyspnea on exertion.  Multiple hospital notes 07/2023 reviewed.  Most recent PCP note reviewed.  Patient was mid to the hospital 07/2023.  Fluid overload.  Bilateral pleural effusions on x-ray chest x-ray of left greater than right some mild improvement.  But then stagnated.  Underwent thoracentesis 07/2023 1100 mL serosanguineous fluid of removed.  Mildly exudative, lymphocyte predominant.  Cytology negative.  Concerning for parapneumonic effusion.  Overall improved.  Repeat chest x-ray 08/2023 improved on my review and interpretation.  He complains of dyspnea on exertion.  Present for some time.  Preceded pneumonia 07/2023.  Has albuterol  inhaler.  That helps.  Resting helps.  Worse on inclines or stairs.  With normal exertion not really a problem.  No other relieving or exacerbating factors.  No seasonal or environmental factors he can identify that makes things better or worse.  No position make things better or worse.  He denies history of asthma.  Questionaires / Pulmonary Flowsheets:   ACT:      No data to display          MMRC:     No data to display          Epworth:      No data to display          Tests:   FENO:  No results found for: NITRICOXIDE  PFT:     No data to display          WALK:      No data to display          Imaging: Personally reviewed and as per EMR and discussion in this note No results found.  Lab Results: Personally reviewed CBC    Component Value Date/Time   WBC 4.2 09/28/2023 0910   RBC 2.85 (L) 09/28/2023 0910   HGB 8.4 (L) 09/28/2023 0910   HGB 10.5 (L) 07/07/2023 0923   HCT 29.0 (L) 09/28/2023 0910    HCT 34.1 (L) 07/07/2023 0923   PLT 152 09/28/2023 0910   PLT 140 (L) 07/07/2023 0923   MCV 101.8 (H) 09/28/2023 0910   MCV 93 07/07/2023 0923   MCH 29.5 09/28/2023 0910   MCHC 29.0 (L) 09/28/2023 0910   RDW 16.6 (H) 09/28/2023 0910   RDW 12.1 07/07/2023 0923   LYMPHSABS 1.2 08/17/2023 0837   LYMPHSABS 1.4 10/07/2020 1054   MONOABS 0.8 08/17/2023 0837   EOSABS 0.3 08/17/2023 0837   EOSABS 0.2 10/07/2020 1054   BASOSABS 0.0 08/17/2023 0837   BASOSABS 0.0 10/07/2020 1054    BMET    Component Value Date/Time   NA 141 03/15/2024 1135   K 3.8 03/15/2024 1135   CL 103 03/15/2024 1135   CO2 22 03/15/2024 1135   GLUCOSE 93 03/15/2024 1135   GLUCOSE 95 09/28/2023 0910   BUN 82 (HH) 03/15/2024 1135   CREATININE 3.05 (H) 03/15/2024 1135   CALCIUM  9.5 03/15/2024 1135   CALCIUM  9.4 02/28/2024 0000   GFRNONAA 20 (L) 09/28/2023 0910   GFRAA 37 (L) 10/07/2020 1054    BNP    Component Value Date/Time   BNP 111.0 (  H) 07/26/2023 0239    ProBNP No results found for: PROBNP  Specialty Problems       Pulmonary Problems   Shortness of breath   I will check labs as below. Deconditioning is likely contributory. I will schedule him for 2d Echo to evaluate cardiac function. I will also refer him to Cardio      Pleural effusion on left   Pleural effusion   Exudative pleural effusion    Allergies  Allergen Reactions   Ace Inhibitors Cough    Immunization History  Administered Date(s) Administered   Fluad Trivalent(High Dose 65+) 07/07/2023   Moderna Covid-19 Vaccine Bivalent Booster 64yrs & up 10/13/2021   Moderna Sars-Covid-2 Vaccination 08/24/2019, 09/21/2019   PNEUMOCOCCAL CONJUGATE-20 11/10/2021   Pfizer(Comirnaty)Fall Seasonal Vaccine 12 years and older 05/06/2023   Zoster Recombinant(Shingrix ) 12/07/2022    Past Medical History:  Diagnosis Date   Ascending aortic aneurysm 03/19/2022   AV block, 2nd degree    BPH (benign prostatic hyperplasia)    CHF  (congestive heart failure) (HCC)    Chronic kidney disease    ESRD on HD at [facility]  via Right IJ   Chronic pleural effusion    Coronary artery calcification 03/19/2022   Erectile dysfunction    Gout    Heart murmur    Hyperlipidemia    Hypertension    Obesity    Pneumonia     Tobacco History: Social History   Tobacco Use  Smoking Status Never  Smokeless Tobacco Never   Counseling given: Not Answered   Continue to not smoke  Outpatient Encounter Medications as of 06/27/2024  Medication Sig   acetaminophen  (TYLENOL ) 500 MG tablet Take 1,000 mg by mouth every 8 (eight) hours as needed for moderate pain (pain score 4-6).   albuterol  (VENTOLIN  HFA) 108 (90 Base) MCG/ACT inhaler Inhale 1-2 puffs into the lungs every 6 (six) hours as needed for wheezing or shortness of breath.   allopurinol  (ZYLOPRIM ) 100 MG tablet TAKE 1 TABLET BY MOUTH AFTER DIALYSIS ON MONDAY, TUESDAY, THURSDAY, AND FRIDAY. TITRATE AS RECOMMENDED BY NEPHROLOGY.   benzonatate (TESSALON) 100 MG capsule Take 100 mg by mouth 3 (three) times daily.   calcium  acetate (PHOSLO ) 667 MG capsule Take 1 capsule (667 mg total) by mouth 3 (three) times daily.   finasteride  (PROSCAR ) 5 MG tablet TAKE 1 TABLET BY MOUTH EVERY DAY   fluticasone -salmeterol (ADVAIR) 250-50 MCG/ACT AEPB Inhale 1 puff into the lungs in the morning and at bedtime.   hydrALAZINE  (APRESOLINE ) 100 MG tablet Take 100 mg by mouth daily.   rosuvastatin  (CRESTOR ) 10 MG tablet Take 10 mg by mouth daily.   tamsulosin  (FLOMAX ) 0.4 MG CAPS capsule TAKE 1 CAPSLE BY MOUTH DAILY   torsemide  (DEMADEX ) 20 MG tablet Take 40 mg by mouth daily.   vutrisiran  sodium (AMVUTTRA ) 25 MG/0.5ML syringe Inject 0.5 mLs (25 mg total) into the skin every 3 (three) months.   No facility-administered encounter medications on file as of 06/27/2024.     Review of Systems  Review of Systems  No chest pain with exertion.  Orthopnea or PND.  Comprehensive review of systems otherwise  negative. Physical Exam  BP 130/82   Pulse (!) 40   Ht 5' 6 (1.676 m) Comment: per pt  Wt 221 lb (100.2 kg)   SpO2 97%   BMI 35.67 kg/m   Wt Readings from Last 5 Encounters:  06/27/24 221 lb (100.2 kg)  04/13/24 225 lb (102.1 kg)  03/16/24 220 lb  9.6 oz (100.1 kg)  02/16/24 218 lb 12.8 oz (99.2 kg)  11/16/23 214 lb (97.1 kg)    BMI Readings from Last 5 Encounters:  06/27/24 35.67 kg/m  04/13/24 36.32 kg/m  03/16/24 35.61 kg/m  02/16/24 35.32 kg/m  11/16/23 34.54 kg/m     Physical Exam General: Sitting in chair, no acute distress Eyes: EOMI, icterus Neck: Supple, no JVP Pulmonary: Distant but symmetric bilaterally Cardiovascular: Warm, minimal edema in the lower extremity   Assessment & Plan:   Pleural effusion: Present since 07/2023 persistent 08/2023.  Status post thoracentesis 07/2023.  Seen again on MRI 04/2024 prompting referral.  Possibly parapneumonic although lymphocyte predominant.  Imaging otherwise not really consistent with pneumonia.  Mildly exudative.  Possibly persistent in the setting of mild volume overload and predilection for the left given proceeding inflammation and poor lymph drainage due to prior effusion.  Repeat chest x-ray, consider resampling again if sizable, notably 08/2023 it was smaller than it had been at time of thoracentesis.  Dyspnea on exertion: Possibly related to pleural effusion.  But symptoms preceded this.  Also suspicious for anemia contributing given his chronic anemia.  Has had albuterol  prescription for some time.  This helps.  Begs question of underlying asthma.  PFTs for further evaluation.  Asthma: Presumed diagnosis with improvement as albuterol  in terms of dyspnea.  PFTs as above.  Start mid dose Wixela/Advair discus 1 puff twice a day.  See if this helps.   Return in about 3 months (around 09/27/2024) for f/u Dr. Annella, after PFT.   Andrew JONELLE Annella, MD 06/27/2024   This appointment required 41 minutes of  patient care (this includes precharting, chart review, review of results, face-to-face care, etc.).

## 2024-06-27 NOTE — Patient Instructions (Signed)
 Nice to meet you  To help with the breathing I recommend a different inhaler since albuterol  helps  Use Advair discus or Wixela 1 inhalation twice a day, every day.  Rinse your mouth out with water after every use  Continue albuterol  as needed as you are  Chest x-ray today,  Pulmonary function test, please schedule this at your convenience in the coming weeks for further evaluation of your shortness of breath.  Return to clinic in 3 months or sooner as needed with Dr. Annella

## 2024-06-29 ENCOUNTER — Ambulatory Visit: Payer: Self-pay | Admitting: Pulmonary Disease

## 2024-06-29 DIAGNOSIS — J90 Pleural effusion, not elsewhere classified: Secondary | ICD-10-CM

## 2024-06-29 NOTE — Progress Notes (Signed)
 CXR shows fluid around lungs is slightly worse. IR thoracentesis ordered.

## 2024-06-29 NOTE — Telephone Encounter (Signed)
 IR thoracentesis ordered as effusion slightly worse. We discussed at OV this plan of CXR showed any worsening.

## 2024-07-02 NOTE — Telephone Encounter (Signed)
 Spoke with patient Andrew Schmitt.   - NFN

## 2024-07-05 ENCOUNTER — Ambulatory Visit (HOSPITAL_COMMUNITY)
Admission: RE | Admit: 2024-07-05 | Discharge: 2024-07-05 | Disposition: A | Discharge status: Home / Self Care | Source: Ambulatory Visit | Attending: Radiology | Admitting: Radiology

## 2024-07-05 ENCOUNTER — Ambulatory Visit (HOSPITAL_COMMUNITY)
Admission: RE | Admit: 2024-07-05 | Discharge: 2024-07-05 | Disposition: A | Discharge status: Home / Self Care | Source: Ambulatory Visit | Attending: Pulmonary Disease | Admitting: Pulmonary Disease

## 2024-07-05 DIAGNOSIS — J90 Pleural effusion, not elsewhere classified: Secondary | ICD-10-CM | POA: Insufficient documentation

## 2024-07-05 DIAGNOSIS — I517 Cardiomegaly: Secondary | ICD-10-CM | POA: Diagnosis not present

## 2024-07-05 HISTORY — PX: IR THORACENTESIS ASP PLEURAL SPACE W/IMG GUIDE: IMG5380

## 2024-07-05 LAB — LACTATE DEHYDROGENASE, PLEURAL OR PERITONEAL FLUID: LD, Fluid: 84 U/L — ABNORMAL HIGH (ref 3–23)

## 2024-07-05 LAB — PROTEIN, PLEURAL OR PERITONEAL FLUID: Total protein, fluid: 3.5 g/dL

## 2024-07-05 MED ORDER — LIDOCAINE-EPINEPHRINE 1 %-1:100000 IJ SOLN
INTRAMUSCULAR | Status: AC
Start: 2024-07-05 — End: 2024-07-05
  Filled 2024-07-05: qty 1

## 2024-07-05 MED ORDER — LIDOCAINE-EPINEPHRINE 1 %-1:100000 IJ SOLN
20.0000 mL | Freq: Once | INTRAMUSCULAR | Status: AC
  Administered 2024-07-05: 15 mL via INTRADERMAL

## 2024-07-05 NOTE — Procedures (Signed)
 Ultrasound-guided diagnostic and therapeutic left thoracentesis performed yielding 650 cc of blood-tinged fluid. No immediate complications. Follow-up chest x-ray pending. The fluid was sent to the lab for preordered studies. EBL none.

## 2024-07-09 LAB — BODY FLUID CULTURE W GRAM STAIN
Culture: NO GROWTH
Gram Stain: NONE SEEN

## 2024-07-09 LAB — CYTOLOGY - NON PAP

## 2024-07-10 ENCOUNTER — Other Ambulatory Visit: Payer: Self-pay

## 2024-07-10 NOTE — Progress Notes (Signed)
 Specialty Pharmacy Refill Coordination Note  Andrew Schmitt is a 73 y.o. male assessed today regarding refills of clinic administered specialty medication(s) Vutrisiran  Sodium (Amvuttra )   Clinic requested Courier to Provider Office   Delivery date: 07/19/24   Verified address: [facility] [address], [place], [address]   Medication will be filled on: 07/18/24   Appointment 07/23/24.

## 2024-07-17 ENCOUNTER — Other Ambulatory Visit (HOSPITAL_COMMUNITY): Payer: Self-pay

## 2024-07-18 ENCOUNTER — Other Ambulatory Visit: Payer: Self-pay

## 2024-07-19 NOTE — Progress Notes (Signed)
 Medication has been picked up and is available in office for upcoming appointment.

## 2024-07-21 ENCOUNTER — Other Ambulatory Visit: Payer: Self-pay | Admitting: Internal Medicine

## 2024-07-21 ENCOUNTER — Ambulatory Visit (HOSPITAL_COMMUNITY)

## 2024-07-23 ENCOUNTER — Ambulatory Visit (HOSPITAL_COMMUNITY)

## 2024-07-23 ENCOUNTER — Other Ambulatory Visit (HOSPITAL_COMMUNITY): Payer: Self-pay

## 2024-07-26 ENCOUNTER — Ambulatory Visit (HOSPITAL_COMMUNITY): Admission: RE | Admit: 2024-07-26 | Discharge: 2024-07-26 | Attending: Internal Medicine

## 2024-07-26 DIAGNOSIS — E854 Organ-limited amyloidosis: Secondary | ICD-10-CM | POA: Diagnosis not present

## 2024-07-26 DIAGNOSIS — I43 Cardiomyopathy in diseases classified elsewhere: Secondary | ICD-10-CM | POA: Diagnosis not present

## 2024-07-26 MED ORDER — VUTRISIRAN SODIUM 25 MG/0.5ML ~~LOC~~ SOSY
25.0000 mg | PREFILLED_SYRINGE | Freq: Once | SUBCUTANEOUS | Status: AC
  Administered 2024-07-26: 25 mg via SUBCUTANEOUS

## 2024-07-26 NOTE — Progress Notes (Signed)
° °  °  Advanced Heart Failure Clinic Note   Referring Physician: Jarold Medici, MD  Primary Care: Jarold Medici, MD HF Cardiologist: Morene Brownie, MD    HPI:  Andrew Schmitt is a 73 y.o. 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   Patient with a longstanding history of hypertension..  Previously evaluated in cardiology clinic for dyspnea on exertion, echocardiogram with grade 2 diastolic dysfunction.  Workup for secondary causes of hypertension was negative, improved somewhat with aggressive blood pressure control.   He was hospitalized in late 2024 for acute on chronic respiratory failure as well as acute renal failure requiring temporary dialysis.  He improved on short-term hemodialysis and his renal function has since improved.  He was referred to heart failure clinic given history of heart failure with preserved ejection fraction.  Presented to AHF Clinic with Dr. Brownie on 05/14/24. Discussed recent test results with patient and daughter, who is a physician in [place] , over the phone.  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 We discussed his genetic testing results and recommendation for testing of first-degree relatives and consideration of cardiac monitoring based on results.  We discussed potential therapeutic options.  As we discussed at his last visit, he does have symptoms of peripheral neuropathy that have worsened over the past few months.  They are not extremely limiting, and he had been able to complete his activities of daily living despite them. He was then referred to pharmacy clinic for administration of Amvuttra .    Today he presents to HF clinic for administration of Amvuttra . This is his second injection. Injection was administered in his left arm and was tolerated well.  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 Referred for TTR silencer therapy with Amvuttra  given his concomitant polyneuropathy.   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 Patient tolerated injection well. Provided patient counseling on Amvuttra . Most common side effects are injection site reactions, arthralgias and dyspnea. Patient is aware to return to clinic every 3 months for repeat injection. Amvuttra  will be obtained from [facility] and couriered to clinic for injection - Patient will need at least 2500-3000 IU (700-900 mcg) of vitamin A daily. Amvuttra  decreases serum vitamin A levels. This can usually be found in a MVI. Patient plans to pick up after visit.    Follow up 3 months for repeat Amvuttra  injection.   Please do not hesitate to reach out with questions or concerns,  Jaun Bash, PharmD, CPP, BCPS, [facility] Heart Failure Pharmacist  Phone - [phone] 07/26/2024 12:58 PM

## 2024-07-26 NOTE — Patient Instructions (Signed)
 It was a pleasure seeing you today. Please return again in 3 months for your next injection. Have a nice day!

## 2024-08-01 ENCOUNTER — Ambulatory Visit: Payer: Medicare PPO | Admitting: Internal Medicine

## 2024-08-01 ENCOUNTER — Ambulatory Visit: Payer: Self-pay

## 2024-08-01 ENCOUNTER — Encounter: Payer: Self-pay | Admitting: Internal Medicine

## 2024-08-01 VITALS — BP 120/70 | HR 60 | Temp 98.0°F | Ht 66.0 in | Wt 226.0 lb

## 2024-08-01 DIAGNOSIS — I2510 Atherosclerotic heart disease of native coronary artery without angina pectoris: Secondary | ICD-10-CM

## 2024-08-01 DIAGNOSIS — D649 Anemia, unspecified: Secondary | ICD-10-CM | POA: Diagnosis not present

## 2024-08-01 DIAGNOSIS — M1A3790 Chronic gout due to renal impairment, unspecified ankle and foot, without tophus (tophi): Secondary | ICD-10-CM | POA: Diagnosis not present

## 2024-08-01 DIAGNOSIS — I130 Hypertensive heart and chronic kidney disease with heart failure and stage 1 through stage 4 chronic kidney disease, or unspecified chronic kidney disease: Secondary | ICD-10-CM

## 2024-08-01 DIAGNOSIS — E8582 Wild-type transthyretin-related (ATTR) amyloidosis: Secondary | ICD-10-CM | POA: Diagnosis not present

## 2024-08-01 DIAGNOSIS — I5032 Chronic diastolic (congestive) heart failure: Secondary | ICD-10-CM

## 2024-08-01 DIAGNOSIS — Z0000 Encounter for general adult medical examination without abnormal findings: Secondary | ICD-10-CM

## 2024-08-01 DIAGNOSIS — I441 Atrioventricular block, second degree: Secondary | ICD-10-CM

## 2024-08-01 DIAGNOSIS — E7800 Pure hypercholesterolemia, unspecified: Secondary | ICD-10-CM

## 2024-08-01 DIAGNOSIS — N184 Chronic kidney disease, stage 4 (severe): Secondary | ICD-10-CM | POA: Diagnosis not present

## 2024-08-01 DIAGNOSIS — I132 Hypertensive heart and chronic kidney disease with heart failure and with stage 5 chronic kidney disease, or end stage renal disease: Secondary | ICD-10-CM

## 2024-08-01 NOTE — Progress Notes (Signed)
 I,[name], CMA,acting as a neurosurgeon for [name], MD.,have documented all relevant documentation on the behalf of [name], MD,as directed by  [name], MD while in the presence of [name], MD.  Subjective:  Patient ID: Andrew Schmitt , male    DOB: 1951-04-06 , 73 y.o.   MRN: [id number]  Chief Complaint  Patient presents with   Hypertension    Patient presents today for bp & cholesterol check. He reports compliance with meds. He denies headaches, chest pain and palpitations. He has no specific concerns or complaints at this time.  AWV completed with [facility] advisor: Jiles.    Hyperlipidemia    HPI Discussed the use of AI scribe software for clinical note transcription with the patient, who gave verbal consent to proceed.  History of Present Illness Andrew Schmitt is a 73 year old male with hypertension and amyloidosis who presents for a blood pressure check and follow-up after a Medicare nurse visit.  AWV performed by [facility] Advisor.   He is currently taking multiple medications including hydralazine  three times a day, calcium  acetate, finasteride , Advair, tamsulosin , torsemide , and Jardiance. He recently switched from Farxiga to Jardiance due to cost issues. He is also on Amvuttra  injections every three months for amyloidosis, with the last injection received last Thursday or Friday. He notes improvement in his condition since starting these injections.  He has a history of fluid accumulation around his heart and lungs, which has impacted his breathing for years. He recalls a previous episode where fluid was removed from around his heart, and more recently, 1.6 liters of fluid was removed from his lungs by a lung specialist, which has significantly improved his breathing. He continues to use Advair twice daily and carries an inhaler with him at all times.  He mentions a past episode of severe illness following a flu shot last year, which led to  hospitalization and fluid removal from his lungs. He has not experienced any recent gout attacks and is currently off allopurinol , pending a check of his uric acid levels.  He is retired, having stopped working in 2014, and lives with his wife and granddaughter, whom he helps care for. He monitors his weight at home, aiming to stay between 215 and 220 pounds, although he sometimes weighs more due to clothing and other items.  He has been anemic in the past. He is unsure if he is currently on iron  supplements.   Hypertension This is a chronic problem. The current episode started more than 1 year ago. The problem has been gradually improving since onset. Pertinent negatives include no blurred vision, chest pain or palpitations. Risk factors for coronary artery disease include obesity, sedentary lifestyle, male gender and dyslipidemia. Past treatments include direct vasodilators. Identifiable causes of hypertension include chronic renal disease.  Hyperlipidemia This is a chronic problem. The current episode started more than 1 year ago. The problem is controlled. Exacerbating diseases include chronic renal disease and obesity. Pertinent negatives include no chest pain. The current treatment provides moderate improvement of lipids.     Past Medical History:  Diagnosis Date   Ascending aortic aneurysm 03/19/2022   AV block, 2nd degree    BPH (benign prostatic hyperplasia)    CHF (congestive heart failure) (HCC)    Chronic kidney disease    ESRD on HD at [facility]  via Right IJ   Chronic pleural effusion    Coronary artery calcification 03/19/2022   Erectile dysfunction  Gout    Heart murmur    Hyperlipidemia    Hypertension    Obesity    Pneumonia      Family History  Problem Relation Age of Onset   Healthy Mother    Healthy Father      Current Outpatient Medications:    acetaminophen  (TYLENOL ) 500 MG tablet, Take 1,000 mg by mouth every 8 (eight) hours as needed for moderate pain  (pain score 4-6)., Disp: , Rfl:    albuterol  (VENTOLIN  HFA) 108 (90 Base) MCG/ACT inhaler, Inhale 1-2 puffs into the lungs every 6 (six) hours as needed for wheezing or shortness of breath., Disp: , Rfl:    allopurinol  (ZYLOPRIM ) 100 MG tablet, TAKE 1 TABLET BY MOUTH AFTER DIALYSIS ON MONDAY, TUESDAY, THURSDAY, AND FRIDAY. TITRATE AS RECOMMENDED BY NEPHROLOGY. (Patient not taking: Reported on 08/01/2024), Disp: 16 tablet, Rfl: 1   benzonatate (TESSALON) 100 MG capsule, Take 100 mg by mouth 3 (three) times daily., Disp: , Rfl:    calcium  acetate (PHOSLO ) 667 MG capsule, Take 1 capsule (667 mg total) by mouth 3 (three) times daily., Disp: 90 capsule, Rfl: 3   finasteride  (PROSCAR ) 5 MG tablet, TAKE 1 TABLET BY MOUTH EVERY DAY, Disp: 90 tablet, Rfl: 1   fluticasone -salmeterol (ADVAIR) 250-50 MCG/ACT AEPB, Inhale 1 puff into the lungs in the morning and at bedtime., Disp: 60 each, Rfl: 3   hydrALAZINE  (APRESOLINE ) 100 MG tablet, TAKE 1 TABLET BY MOUTH 3 TIMES DAILY., Disp: 270 tablet, Rfl: 2   rosuvastatin  (CRESTOR ) 10 MG tablet, Take 10 mg by mouth daily., Disp: , Rfl:    tamsulosin  (FLOMAX ) 0.4 MG CAPS capsule, TAKE 1 CAPSLE BY MOUTH DAILY, Disp: 90 capsule, Rfl: 3   torsemide  (DEMADEX ) 20 MG tablet, Take 40 mg by mouth daily., Disp: , Rfl:    vutrisiran  sodium (AMVUTTRA ) 25 MG/0.5ML syringe, Inject 0.5 mLs (25 mg total) into the skin every 3 (three) months., Disp: 0.5 mL, Rfl: 3   Allergies  Allergen Reactions   Ace Inhibitors Cough     Review of Systems  Constitutional: Negative.   Eyes:  Negative for blurred vision.  Respiratory: Negative.    Cardiovascular:  Negative for chest pain and palpitations.  Gastrointestinal: Negative.   Skin: Negative.   Allergic/Immunologic: Negative.   Hematological: Negative.      Today's Vitals   08/01/24 0906 08/01/24 0931  BP: (!) 142/70 120/70  Pulse: 60   Temp: 98 F (36.7 C)   SpO2: 98%   Weight: 226 lb (102.5 kg)   Height: 5' 6 (1.676 m)     Body mass index is 36.48 kg/m.  Wt Readings from Last 3 Encounters:  08/01/24 226 lb (102.5 kg)  08/01/24 226 lb (102.5 kg)  06/27/24 221 lb (100.2 kg)    The ASCVD Risk score (Arnett DK, et al., 2019) failed to calculate for the following reasons:   The valid total cholesterol range is 130 to 320 mg/dL  Objective:  Physical Exam Vitals and nursing note reviewed.  Constitutional:      Appearance: Normal appearance. He is obese.  HENT:     Head: Normocephalic and atraumatic.  Eyes:     Extraocular Movements: Extraocular movements intact.  Cardiovascular:     Rate and Rhythm: Normal rate and regular rhythm.     Heart sounds: Normal heart sounds.  Pulmonary:     Effort: Pulmonary effort is normal.     Breath sounds: Normal breath sounds.  Musculoskeletal:  Cervical back: Normal range of motion.     Right lower leg: Edema present.     Left lower leg: Edema present.  Skin:    General: Skin is warm.  Neurological:     General: No focal deficit present.     Mental Status: He is alert.  Psychiatric:        Mood and Affect: Mood normal.         Assessment And Plan:   Assessment & Plan Hypertensive heart and renal disease with renal failure, stage 1 through stage 4 or unspecified chronic kidney disease, with heart failure (HCC)  Coronary artery calcification [I25.10]  Chronic diastolic heart failure (HCC) [I50.32] Fluid overload. Recent thoracentesis removed 1.6 liters of pleural effusion, improving symptoms. Continues on torsemide  and hydralazine . - Continue torsemide  and hydralazine . - Monitor weight regularly. Wild-type transthyretin-related (ATTR) amyloidosis (HCC) ATTR amyloidosis managed with Amvuttra  injections every three months. Recent injection without adverse effects. Amyloidosis may contribute to heart failure. - Continue Amvuttra  injections every three months. Chronic kidney disease, stage IV (severe) (HCC) Chronic, he is encouraged to stay well  hydrated, avoid NSAIDs and keep BP controlled to prevent progression of CKD.   Recent switch from Farxiga to [place] due to cost. - Continue calcium  acetate and Jardiance. - Checked uric acid levels. Pure hypercholesterolemia Chronic, LDL goal is less than 70 due to underlying CAD. Will continue with rosuvastatin  20mg  daily. Chronic gout due to renal impairment involving foot without tophus, unspecified laterality Chronic gout managed with allopurinol , currently discontinued. No recent gout attacks. - Checked uric acid levels. Chronic anemia Chronic gout managed with allopurinol , currently discontinued. No recent gout attacks. - Checked uric acid levels. Morbid obesity due to excess calories (HCC) BMI 36 with underlying HTN, hyperlipidemia and other comorbid conditions.  He is encouraged to strive to lose ten percent of his body weight to decrease cardiac risk.     Orders Placed This Encounter  Procedures   Uric acid   Comprehensive metabolic panel with GFR   CBC no Diff   Iron , TIBC and Ferritin Panel   Vitamin B12     Return for 1 year ov w thn & ov w rs.  Patient was given opportunity to ask questions. Patient verbalized understanding of the plan and was able to repeat key elements of the plan. All questions were answered to their satisfaction.    I, [name], MD, have reviewed all documentation for this visit. The documentation on 08/11/2024 for the exam, diagnosis, procedures, and orders are all accurate and complete.   IF YOU HAVE BEEN REFERRED TO A SPECIALIST, IT MAY TAKE 1-2 WEEKS TO SCHEDULE/PROCESS THE REFERRAL. IF YOU HAVE NOT HEARD FROM US /SPECIALIST IN TWO WEEKS, PLEASE GIVE US  A CALL AT [phone].

## 2024-08-01 NOTE — Patient Instructions (Signed)
 Hypertension, Adult Hypertension is another name for high blood pressure. High blood pressure forces your heart to work harder to pump blood. This can cause problems over time. There are two numbers in a blood pressure reading. There is a top number (systolic) over a bottom number (diastolic). It is best to have a blood pressure that is below 120/80. What are the causes? The cause of this condition is not known. Some other conditions can lead to high blood pressure. What increases the risk? Some lifestyle factors can make you more likely to develop high blood pressure: Smoking. Not getting enough exercise or physical activity. Being overweight. Having too much fat, sugar, calories, or salt (sodium) in your diet. Drinking too much alcohol. Other risk factors include: Having any of these conditions: Heart disease. Diabetes. High cholesterol. Kidney disease. Obstructive sleep apnea. Having a family history of high blood pressure and high cholesterol. Age. The risk increases with age. Stress. What are the signs or symptoms? High blood pressure may not cause symptoms. Very high blood pressure (hypertensive crisis) may cause: Headache. Fast or uneven heartbeats (palpitations). Shortness of breath. Nosebleed. Vomiting or feeling like you may vomit (nauseous). Changes in how you see. Very bad chest pain. Feeling dizzy. Seizures. How is this treated? This condition is treated by making healthy lifestyle changes, such as: Eating healthy foods. Exercising more. Drinking less alcohol. Your doctor may prescribe medicine if lifestyle changes do not help enough and if: Your top number is above 130. Your bottom number is above 80. Your personal target blood pressure may vary. Follow these instructions at home: Eating and drinking  If told, follow the DASH eating plan. To follow this plan: Fill one half of your plate at each meal with fruits and vegetables. Fill one fourth of your plate  at each meal with whole grains. Whole grains include whole-wheat pasta, brown rice, and whole-grain bread. Eat or drink low-fat dairy products, such as skim milk or low-fat yogurt. Fill one fourth of your plate at each meal with low-fat (lean) proteins. Low-fat proteins include fish, chicken without skin, eggs, beans, and tofu. Avoid fatty meat, cured and processed meat, or chicken with skin. Avoid pre-made or processed food. Limit the amount of salt in your diet to less than 1,500 mg each day. Do not drink alcohol if: Your doctor tells you not to drink. You are pregnant, may be pregnant, or are planning to become pregnant. If you drink alcohol: Limit how much you have to: 0-1 drink a day for women. 0-2 drinks a day for men. Know how much alcohol is in your drink. In the [place] one drink equals one 12 oz bottle of beer (355 mL), one 5 oz glass of wine (148 mL), or one 1 oz glass of hard liquor (44 mL). Lifestyle  Work with your doctor to stay at a healthy weight or to lose weight. Ask your doctor what the best weight is for you. Get at least 30 minutes of exercise that causes your heart to beat faster (aerobic exercise) most days of the week. This may include walking, swimming, or biking. Get at least 30 minutes of exercise that strengthens your muscles (resistance exercise) at least 3 days a week. This may include lifting weights or doing Pilates. Do not smoke or use any products that contain nicotine or tobacco. If you need help quitting, ask your doctor. Check your blood pressure at home as told by your doctor. Keep all follow-up visits. Medicines Take over-the-counter and prescription medicines  only as told by your doctor. Follow directions carefully. Do not skip doses of blood pressure medicine. The medicine does not work as well if you skip doses. Skipping doses also puts you at risk for problems. Ask your doctor about side effects or reactions to medicines that you should watch  for. Contact a doctor if: You think you are having a reaction to the medicine you are taking. You have headaches that keep coming back. You feel dizzy. You have swelling in your ankles. You have trouble with your vision. Get help right away if: You get a very bad headache. You start to feel mixed up (confused). You feel weak or numb. You feel faint. You have very bad pain in your: Chest. Belly (abdomen). You vomit more than once. You have trouble breathing. These symptoms may be an emergency. Get help right away. Call 911. Do not wait to see if the symptoms will go away. Do not drive yourself to the hospital. Summary Hypertension is another name for high blood pressure. High blood pressure forces your heart to work harder to pump blood. For most people, a normal blood pressure is less than 120/80. Making healthy choices can help lower blood pressure. If your blood pressure does not get lower with healthy choices, you may need to take medicine. This information is not intended to replace advice given to you by your health care provider. Make sure you discuss any questions you have with your health care provider. Document Revised: 05/21/2021 Document Reviewed: 05/21/2021 Elsevier Patient Education  2024 [employer].

## 2024-08-01 NOTE — Patient Instructions (Signed)
 [name],  Thank you for taking the time for your Medicare Wellness Visit. I appreciate your continued commitment to your health goals. Please review the care plan we discussed, and feel free to reach out if I can assist you further.  Please note that Annual Wellness Visits do not include a physical exam. Some assessments may be limited, especially if the visit was conducted virtually. If needed, we may recommend an in-person follow-up with your provider.  Ongoing Care Seeing your primary care provider every 3 to 6 months helps us  monitor your health and provide consistent, personalized care.   Referrals If a referral was made during today's visit and you haven't received any updates within two weeks, please contact the referred provider directly to check on the status.  Recommended Screenings:  Health Maintenance  Topic Date Due   Medicare Annual Wellness Visit  07/06/2024   COVID-19 Vaccine (5 - 2025-26 season) 08/17/2024*   Zoster (Shingles) Vaccine (2 of 2) 10/30/2024*   Flu Shot  11/13/2024*   DTaP/Tdap/Td vaccine (1 - Tdap) 08/01/2025*   Colon Cancer Screening  05/05/2031   Pneumococcal Vaccine for age over 15  Completed   Hepatitis C Screening  Completed   Meningitis B Vaccine  Aged Out  *Topic was postponed. The date shown is not the original due date.       08/01/2024    9:11 AM  Advanced Directives  Does Patient Have a Medical Advance Directive? No  Would patient like information on creating a medical advance directive? No - Patient declined    Vision: Annual vision screenings are recommended for early detection of glaucoma, cataracts, and diabetic retinopathy. These exams can also reveal signs of chronic conditions such as diabetes and high blood pressure.  Dental: Annual dental screenings help detect early signs of oral cancer, gum disease, and other conditions linked to overall health, including heart disease and diabetes.  Please see the attached documents for  additional preventive care recommendations.

## 2024-08-01 NOTE — Progress Notes (Signed)
 Chief Complaint  Patient presents with   Medicare Wellness     Subjective:   Andrew Schmitt is a 73 y.o. male who presents for a Medicare Annual Wellness Visit.  Visit info / Clinical Intake: Medicare Wellness Visit Type:: Subsequent Annual Wellness Visit Persons participating in visit and providing information:: patient Medicare Wellness Visit Mode:: In-person (required for WTM) Interpreter Needed?: No Pre-visit prep was completed: yes AWV questionnaire completed by patient prior to visit?: no Living arrangements:: lives with spouse/significant other Patient's Overall Health Status Rating: very good Typical amount of pain: some Does pain affect daily life?: no Are you currently prescribed opioids?: no  Dietary Habits and Nutritional Risks How many meals a day?: 3 Eats fruit and vegetables daily?: yes Most meals are obtained by: preparing own meals In the last 2 weeks, have you had any of the following?: none Diabetic:: no  Functional Status Activities of Daily Living (to include ambulation/medication): Independent Ambulation: Independent Medication Administration: Independent Home Management (perform basic housework or laundry): Independent Manage your own finances?: yes Primary transportation is: driving Concerns about vision?: no *vision screening is required for WTM* Concerns about hearing?: no  Fall Screening Falls in the past year?: 0 Number of falls in past year: 0 Was there an injury with Fall?: 0 Fall Risk Category Calculator: 0 Patient Fall Risk Level: Low Fall Risk  Fall Risk Patient at Risk for Falls Due to: Medication side effect Fall risk Follow up: Falls prevention discussed; Falls evaluation completed  Home and Transportation Safety: All rugs have non-skid backing?: yes All stairs or steps have railings?: yes Grab bars in the bathtub or shower?: (!) no Have non-skid surface in bathtub or shower?: (!) no Good home lighting?: yes Regular  seat belt use?: yes Hospital stays in the last year:: no  Cognitive Assessment Difficulty concentrating, remembering, or making decisions? : no Will 6CIT or Mini Cog be Completed: yes What year is it?: 0 points What month is it?: 0 points Give patient an address phrase to remember (5 components): [address] About what time is it?: 0 points Count backwards from 20 to 1: 0 points Say the months of the year in reverse: 0 points Repeat the address phrase from earlier: 0 points 6 CIT Score: 0 points  Advance Directives (For Healthcare) Does Patient Have a Medical Advance Directive?: No Does patient want to make changes to medical advance directive?: No - Patient declined Would patient like information on creating a medical advance directive?: No - Patient declined  Reviewed/Updated  Reviewed/Updated: Reviewed All (Medical, Surgical, Family, Medications, Allergies, Care Teams, Patient Goals)    Allergies (verified) Ace inhibitors   Current Medications (verified) Outpatient Encounter Medications as of 08/01/2024  Medication Sig   acetaminophen  (TYLENOL ) 500 MG tablet Take 1,000 mg by mouth every 8 (eight) hours as needed for moderate pain (pain score 4-6).   albuterol  (VENTOLIN  HFA) 108 (90 Base) MCG/ACT inhaler Inhale 1-2 puffs into the lungs every 6 (six) hours as needed for wheezing or shortness of breath.   benzonatate (TESSALON) 100 MG capsule Take 100 mg by mouth 3 (three) times daily.   calcium  acetate (PHOSLO ) 667 MG capsule Take 1 capsule (667 mg total) by mouth 3 (three) times daily.   finasteride  (PROSCAR ) 5 MG tablet TAKE 1 TABLET BY MOUTH EVERY DAY   fluticasone -salmeterol (ADVAIR) 250-50 MCG/ACT AEPB Inhale 1 puff into the lungs in the morning and at bedtime.   hydrALAZINE  (APRESOLINE ) 100 MG tablet TAKE 1  TABLET BY MOUTH 3 TIMES DAILY.   rosuvastatin  (CRESTOR ) 10 MG tablet Take 10 mg by mouth daily.   tamsulosin  (FLOMAX ) 0.4 MG CAPS capsule TAKE 1 CAPSLE BY  MOUTH DAILY   torsemide  (DEMADEX ) 20 MG tablet Take 40 mg by mouth daily.   vutrisiran  sodium (AMVUTTRA ) 25 MG/0.5ML syringe Inject 0.5 mLs (25 mg total) into the skin every 3 (three) months.   allopurinol  (ZYLOPRIM ) 100 MG tablet TAKE 1 TABLET BY MOUTH AFTER DIALYSIS ON MONDAY, TUESDAY, THURSDAY, AND FRIDAY. TITRATE AS RECOMMENDED BY NEPHROLOGY. (Patient not taking: Reported on 08/01/2024)   No facility-administered encounter medications on file as of 08/01/2024.    History: Past Medical History:  Diagnosis Date   Ascending aortic aneurysm 03/19/2022   AV block, 2nd degree    BPH (benign prostatic hyperplasia)    CHF (congestive heart failure) (HCC)    Chronic kidney disease    ESRD on HD at [facility]  via Right IJ   Chronic pleural effusion    Coronary artery calcification 03/19/2022   Erectile dysfunction    Gout    Heart murmur    Hyperlipidemia    Hypertension    Obesity    Pneumonia    Past Surgical History:  Procedure Laterality Date   CARDIAC SURGERY     drained fluid only   IR FLUORO GUIDE CV LINE RIGHT  08/04/2023   IR FLUORO GUIDE CV LINE RIGHT  08/09/2023   IR THORACENTESIS ASP PLEURAL SPACE W/IMG GUIDE  07/05/2024   IR US  GUIDE VASC ACCESS RIGHT  08/04/2023   IR US  GUIDE VASC ACCESS RIGHT  08/11/2023   THORACENTESIS Left 07/26/2023   Procedure: THORACENTESIS;  Surgeon: Annella Donnice SAUNDERS, MD;  Location: [facility] ENDOSCOPY;  Service: Pulmonary;  Laterality: Left;   TRANSURETHRAL RESECTION OF BLADDER TUMOR N/A 09/28/2023   Procedure: TRANSURETHRAL RESECTION OF BLADDER TUMOR (TURBT) , BILATERAL RETROGRADE PYELOGRAM;  Surgeon: Carolee Sherwood [name], MD;  Location: [facility] ORS;  Service: Urology;  Laterality: N/A;  60 MINUTE CASE   Family History  Problem Relation Age of Onset   Healthy Mother    Healthy Father    Social History   Occupational History   Occupation: retired  Tobacco Use   Smoking status: Never   Smokeless tobacco: Never  Vaping Use   Vaping status: Never  Used  Substance and Sexual Activity   Alcohol use: No   Drug use: No   Sexual activity: Yes   Tobacco Counseling Counseling given: Not Answered  SDOH Screenings   Food Insecurity: No Food Insecurity (08/01/2024)  Housing: Unknown (08/01/2024)  Transportation Needs: No Transportation Needs (08/01/2024)  Utilities: Not At Risk (08/01/2024)  Alcohol Screen: Low Risk (08/01/2024)  Depression (PHQ2-9): Low Risk (08/01/2024)  Financial Resource Strain: Low Risk (08/01/2024)  Physical Activity: Insufficiently Active (08/01/2024)  Social Connections: Moderately Isolated (08/01/2024)  Stress: No Stress Concern Present (08/01/2024)  Tobacco Use: Low Risk (08/01/2024)  Health Literacy: Adequate Health Literacy (08/01/2024)   See flowsheets for full screening details  Depression Screen PHQ 2 & 9 Depression Scale- Over the past 2 weeks, how often have you been bothered by any of the following problems? Little interest or pleasure in doing things: 0 Feeling down, depressed, or hopeless (PHQ Adolescent also includes...irritable): 0 PHQ-2 Total Score: 0 Trouble falling or staying asleep, or sleeping too much: 0 Feeling tired or having little energy: 0 Poor appetite or overeating (PHQ Adolescent also includes...weight loss): 0 Feeling bad about yourself - or that you  are a failure or have let yourself or your family down: 0 Trouble concentrating on things, such as reading the newspaper or watching television (PHQ Adolescent also includes...like school work): 0 Moving or speaking so slowly that other people could have noticed. Or the opposite - being so fidgety or restless that you have been moving around a lot more than usual: 0 Thoughts that you would be better off dead, or of hurting yourself in some way: 0 PHQ-9 Total Score: 0 If you checked off any problems, how difficult have these problems made it for you to do your work, take care of things at home, or get along with other people?: Not  difficult at all  Depression Treatment Depression Interventions/Treatment : EYV7-0 Score <4 Follow-up Not Indicated     Goals Addressed             This Visit's Progress    Patient Stated       08/01/2024, be cautious             Objective:    Today's Vitals   08/01/24 0900 08/01/24 0918  BP: (!) 142/70 120/70  Pulse: 60   Temp: 98 F (36.7 C)   TempSrc: Oral   Weight: 226 lb (102.5 kg)   Height: 5' 6 (1.676 m)    Body mass index is 36.48 kg/m.  Hearing/Vision screen Hearing Screening - Comments:: Denies hearing issues Vision Screening - Comments:: No regular eye exams Immunizations and Health Maintenance Health Maintenance  Topic Date Due   COVID-19 Vaccine (5 - 2025-26 season) 08/17/2024 (Originally 04/16/2024)   Zoster Vaccines- Shingrix  (2 of 2) 10/30/2024 (Originally 02/01/2023)   Influenza Vaccine  11/13/2024 (Originally 03/16/2024)   DTaP/Tdap/Td (1 - Tdap) 08/01/2025 (Originally 09/25/1969)   Medicare Annual Wellness (AWV)  08/01/2025   Colonoscopy  05/05/2031   Pneumococcal Vaccine: 50+ Years  Completed   Hepatitis C Screening  Completed   Meningococcal B Vaccine  Aged Out        Assessment/Plan:  This is a routine wellness examination for [facility].  Patient Care Team: Jarold Medici, MD as PCP - General (Internal Medicine) Raford Riggs, MD as PCP - Cardiology (Cardiology) Raford Riggs, MD as Attending Physician (Cardiology) Zenaida Morene PARAS, MD as Consulting Physician (Cardiology) Carolee Sherwood [name], MD as Consulting Physician (Urology) Hunsucker, Donnice SAUNDERS, MD as Consulting Physician (Pulmonary Disease) Marlee Bernardino NOVAK, MD as Attending Physician (Nephrology)  I have personally reviewed and noted the following in the patients chart:   Medical and social history Use of alcohol, tobacco or illicit drugs  Current medications and supplements including opioid prescriptions. Functional ability and status Nutritional status Physical  activity Advanced directives List of other physicians  Hospitalizations, surgeries, and ER visits in previous 12 months Vitals Screenings to include cognitive, depression, and falls Referrals and appointments  No orders of the defined types were placed in this encounter.  In addition, I have reviewed and discussed with patient certain preventive protocols, quality metrics, and best practice recommendations. A written personalized care plan for preventive services as well as general preventive health recommendations were provided to patient.   Ardella FORBES Dawn, LPN   [id number]   Return in 1 year (on 08/01/2025).  After Visit Summary: (In Person-Printed) AVS printed and given to the patient  Nurse Notes: Vaccines not given: all vaccines declined today

## 2024-08-02 LAB — IRON,TIBC AND FERRITIN PANEL
Ferritin: 376 ng/mL (ref 30–400)
Iron Saturation: 23 % (ref 15–55)
Iron: 48 ug/dL (ref 38–169)
Total Iron Binding Capacity: 212 ug/dL — ABNORMAL LOW (ref 250–450)
UIBC: 164 ug/dL (ref 111–343)

## 2024-08-02 LAB — COMPREHENSIVE METABOLIC PANEL WITH GFR
ALT: 30 IU/L (ref 0–44)
AST: 34 IU/L (ref 0–40)
Albumin: 3.9 g/dL (ref 3.8–4.8)
Alkaline Phosphatase: 84 IU/L (ref 47–123)
BUN/Creatinine Ratio: 19 (ref 10–24)
BUN: 67 mg/dL — ABNORMAL HIGH (ref 8–27)
Bilirubin Total: 0.6 mg/dL (ref 0.0–1.2)
CO2: 26 mmol/L (ref 20–29)
Calcium: 9.1 mg/dL (ref 8.6–10.2)
Chloride: 104 mmol/L (ref 96–106)
Creatinine, Ser: 3.46 mg/dL — ABNORMAL HIGH (ref 0.76–1.27)
Globulin, Total: 3 g/dL (ref 1.5–4.5)
Glucose: 105 mg/dL — ABNORMAL HIGH (ref 70–99)
Potassium: 4.4 mmol/L (ref 3.5–5.2)
Sodium: 146 mmol/L — ABNORMAL HIGH (ref 134–144)
Total Protein: 6.9 g/dL (ref 6.0–8.5)
eGFR: 18 mL/min/1.73 — ABNORMAL LOW (ref >59)

## 2024-08-02 LAB — CBC
Hematocrit: 34.6 % — ABNORMAL LOW (ref 37.5–51.0)
Hemoglobin: 10.8 g/dL — ABNORMAL LOW (ref 13.0–17.7)
MCH: 30.5 pg (ref 26.6–33.0)
MCHC: 31.2 g/dL — ABNORMAL LOW (ref 31.5–35.7)
MCV: 98 fL — ABNORMAL HIGH (ref 79–97)
Platelets: 114 x10E3/uL — ABNORMAL LOW (ref 150–450)
RBC: 3.54 x10E6/uL — ABNORMAL LOW (ref 4.14–5.80)
RDW: 12.1 % (ref 11.6–15.4)
WBC: 3.5 x10E3/uL (ref 3.4–10.8)

## 2024-08-02 LAB — URIC ACID: Uric Acid: 12.1 mg/dL — ABNORMAL HIGH (ref 3.8–8.4)

## 2024-08-02 LAB — VITAMIN B12: Vitamin B-12: 889 pg/mL (ref 232–1245)

## 2024-08-10 ENCOUNTER — Ambulatory Visit: Payer: Self-pay | Admitting: Internal Medicine

## 2024-08-11 NOTE — Assessment & Plan Note (Signed)
 Chronic, LDL goal is less than 70 due to underlying CAD. Will continue with rosuvastatin 20mg  daily.

## 2024-08-11 NOTE — Assessment & Plan Note (Signed)
 Fluid overload. Recent thoracentesis removed 1.6 liters of pleural effusion, improving symptoms. Continues on torsemide  and hydralazine . - Continue torsemide  and hydralazine . - Monitor weight regularly.

## 2024-08-11 NOTE — Assessment & Plan Note (Signed)
 BMI 36 with underlying HTN, hyperlipidemia and other comorbid conditions.  He is encouraged to strive to lose ten percent of his body weight to decrease cardiac risk.

## 2024-08-11 NOTE — Assessment & Plan Note (Signed)
 Chronic gout managed with allopurinol , currently discontinued. No recent gout attacks. - Checked uric acid levels.

## 2024-08-28 ENCOUNTER — Telehealth (HOSPITAL_COMMUNITY): Payer: Self-pay | Admitting: Pharmacist

## 2024-08-28 NOTE — Telephone Encounter (Signed)
 Patient Advocate Encounter   Received notification from [facility] that prior authorization for Amvuttra  is required.   PA submitted on CoverMyMeds Key [id number] Status is pending   Will continue to follow.   Tinnie Redman, PharmD, BCPS, BCCP, CPP Heart Failure Clinic Pharmacist [phone]

## 2024-08-30 NOTE — Telephone Encounter (Signed)
 Advanced Heart Failure Patient Advocate Encounter  Prior Authorization for Amvuttra  has been approved.    Effective dates: 08/29/24 through 08/15/25  Tinnie Redman, PharmD, BCPS, BCCP, CPP Heart Failure Clinic Pharmacist [phone]

## 2024-09-12 ENCOUNTER — Other Ambulatory Visit: Payer: Self-pay | Admitting: Gastroenterology

## 2024-09-19 ENCOUNTER — Ambulatory Visit: Payer: Self-pay

## 2024-09-19 NOTE — Telephone Encounter (Signed)
 FYI Only or Action Required?: FYI only for provider: appointment scheduled on 09/20/24.  Patient is followed in Pulmonology for Pleural Effusion, last seen on 06/27/2024 by Hunsucker, Andrew SAUNDERS, MD.  Called Nurse Triage reporting Shortness of Breath.  Symptoms began about a month ago.  Interventions attempted: Rescue inhaler and Maintenance inhaler.  Symptoms are: stable.  Triage Disposition: See PCP When Office is Open (Within 3 Days)  Patient/caregiver understands and will follow disposition?: Yes  E2C2 Pulmonary Triage - Initial Assessment Questions Chief Complaint (e.g., cough, sob, wheezing, fever, chills, sweat or additional symptoms) *Go to specific symptom protocol after initial questions. Mild SOB, hx of pleural effusion  How long have symptoms been present? About a month or so  Have you tested for COVID or Flu? Note: If not, ask patient if a home test can be taken. If so, instruct patient to call back for positive results.   MEDICINES:   Have you used any OTC meds to help with symptoms?  If yes, ask What medications?   Have you used your inhalers/maintenance medication? Yes If yes, What medications? Wixela, Albuterol    If inhaler, ask How many puffs and how often? Note: Review instructions on medication in the chart.   OXYGEN: Do you wear supplemental oxygen?  If yes, How many liters are you supposed to use?   Do you monitor your oxygen levels? No If yes, What is your reading (oxygen level) today?   What is your usual oxygen saturation reading?  (Note: Pulmonary O2 sats should be 90% or greater)  Reason for Disposition  [1] MODERATE longstanding difficulty breathing (e.g., speaks in phrases, SOB even at rest, pulse 100-120) AND [2] SAME as normal  Answer Assessment - Initial Assessment Questions Hx of Pleural effusion, patient believes there is more fluid accumulating. Reports some relief with Wixela and albuterol . Drinking tea with  eucalyptus and mint, finds that helpful.    1. RESPIRATORY STATUS: Describe your breathing? (e.g., wheezing, shortness of breath, unable to speak, severe coughing)      Difficulty breathing when bending over and with exertion, has to stop and mouth breath quickly, after a few minutes it subsides   2. ONSET: When did this breathing problem begin?      Noticed it worsening when temperature gets very cold, last month or so  3. PATTERN Does the difficult breathing come and go, or has it been constant since it started?      Comes and goes, states his symptoms sometimes flare up which is what's occurring right now  4. RECURRENT SYMPTOM: Have you had difficulty breathing before? If Yes, ask: When was the last time? and What happened that time?      Yes, pleural effusion, thoracentesis   5. CARDIAC HISTORY: Do you have any history of heart disease? (e.g., heart attack, angina, bypass surgery, angioplasty)      Hypertension with heart disease  6. LUNG HISTORY: Do you have any history of lung disease?  (e.g., pulmonary embolus, asthma, emphysema)     Pleural effusion  7. OTHER SYMPTOMS: Do you have any other symptoms? (e.g., chest pain, cough, dizziness, fever, runny nose)     Denies  8. O2 SATURATION MONITOR:  Do you use an oxygen saturation monitor (pulse oximeter) at home? If Yes, ask: What is your reading (oxygen level) today? What is your usual oxygen saturation reading? (e.g., 95%)       Unable to assess  Protocols used: Breathing Difficulty-A-AH  Message from [name] sent at  09/19/2024  2:10 PM EST  Reason for Triage: Concerned about fluid in lungs

## 2024-09-20 ENCOUNTER — Encounter: Payer: Self-pay | Admitting: Pulmonary Disease

## 2024-09-20 ENCOUNTER — Ambulatory Visit: Admitting: Pulmonary Disease

## 2024-09-20 ENCOUNTER — Other Ambulatory Visit: Payer: Self-pay | Admitting: Pulmonary Disease

## 2024-09-20 ENCOUNTER — Ambulatory Visit

## 2024-09-20 VITALS — BP 107/86 | HR 81 | Temp 98.2°F | Ht 66.0 in | Wt 222.0 lb

## 2024-09-20 DIAGNOSIS — R0609 Other forms of dyspnea: Secondary | ICD-10-CM

## 2024-09-20 DIAGNOSIS — J90 Pleural effusion, not elsewhere classified: Secondary | ICD-10-CM

## 2024-09-20 NOTE — Progress Notes (Signed)
 Yeah  @Patient  ID: Andrew Schmitt, male    DOB: 14-Jun-1951, 74 y.o.   MRN: [id number]  Chief Complaint  Patient presents with   Acute Visit    Pt states he is having a difficult time breathing, pt thinks he has some fluid in lungs SOB occurs      Referring provider: Jarold Medici, MD  HPI:   74 y.o. man whom we are seeing for evaluation of pleural effusion.  Chief complaint is dyspnea on exertion.  Most recent PCP note reviewed.  Presents for follow-up.  More dyspneic over the last few days.  Per report of daughter via phone more labored breathing noticed over the last couple days.  He feels like the fluid has built back up.  Chest x-ray obtained today shows enlargement of left pleural effusion as well as chronic relatively stable right pleural effusion.  We discussed at length likely fluid buildup given clear evidence of chronic pulmonary venous hypertension with dilated left atrium in the past likely related diastolic dysfunction and leaky mitral valve.  Discussed importance of low-salt diet.  HPI at initial visit: Patient was mid to the hospital 07/2023.  Fluid overload.  Bilateral pleural effusions on x-ray chest x-ray of left greater than right some mild improvement.  But then stagnated.  Underwent thoracentesis 07/2023 1100 mL serosanguineous fluid of removed.  Mildly exudative, lymphocyte predominant.  Cytology negative.  Concerning for parapneumonic effusion.  Overall improved.  Repeat chest x-ray 08/2023 improved on my review and interpretation.  He complains of dyspnea on exertion.  Present for some time.  Preceded pneumonia 07/2023.  Has albuterol  inhaler.  That helps.  Resting helps.  Worse on inclines or stairs.  With normal exertion not really a problem.  No other relieving or exacerbating factors.  No seasonal or environmental factors he can identify that makes things better or worse.  No position make things better or worse.  He denies history of asthma.  Questionaires /  Pulmonary Flowsheets:   ACT:      No data to display          MMRC:     No data to display          Epworth:      No data to display          Tests:   FENO:  No results found for: NITRICOXIDE  PFT:     No data to display          WALK:      No data to display          Imaging: Personally reviewed and as per EMR and discussion in this note No results found.  Lab Results: Personally reviewed CBC    Component Value Date/Time   WBC 3.5 08/01/2024 0950   WBC 4.2 09/28/2023 0910   RBC 3.54 (L) 08/01/2024 0950   RBC 2.85 (L) 09/28/2023 0910   HGB 10.8 (L) 08/01/2024 0950   HCT 34.6 (L) 08/01/2024 0950   PLT 114 (L) 08/01/2024 0950   MCV 98 (H) 08/01/2024 0950   MCH 30.5 08/01/2024 0950   MCH 29.5 09/28/2023 0910   MCHC 31.2 (L) 08/01/2024 0950   MCHC 29.0 (L) 09/28/2023 0910   RDW 12.1 08/01/2024 0950   LYMPHSABS 1.2 08/17/2023 0837   LYMPHSABS 1.4 10/07/2020 1054   MONOABS 0.8 08/17/2023 0837   EOSABS 0.3 08/17/2023 0837   EOSABS 0.2 10/07/2020 1054   BASOSABS 0.0 08/17/2023 0837   BASOSABS 0.0 10/07/2020  1054    BMET    Component Value Date/Time   NA 146 (H) 08/01/2024 0950   K 4.4 08/01/2024 0950   CL 104 08/01/2024 0950   CO2 26 08/01/2024 0950   GLUCOSE 105 (H) 08/01/2024 0950   GLUCOSE 95 09/28/2023 0910   BUN 67 (H) 08/01/2024 0950   CREATININE 3.46 (H) 08/01/2024 0950   CALCIUM  9.1 08/01/2024 0950   CALCIUM  9.4 02/28/2024 0000   GFRNONAA 20 (L) 09/28/2023 0910   GFRAA 37 (L) 10/07/2020 1054    BNP    Component Value Date/Time   BNP 111.0 (H) 07/26/2023 0239    ProBNP No results found for: PROBNP  Specialty Problems       Pulmonary Problems   Shortness of breath   I will check labs as below. Deconditioning is likely contributory. I will schedule him for 2d Echo to evaluate cardiac function. I will also refer him to Cardio      Pleural effusion on left   Pleural effusion   Exudative pleural effusion     Allergies  Allergen Reactions   Ace Inhibitors Cough    Immunization History  Administered Date(s) Administered   Fluad Trivalent(High Dose 65+) 07/07/2023   Moderna Covid-19 Vaccine Bivalent Booster 51yrs & up 10/13/2021   Moderna Sars-Covid-2 Vaccination 08/24/2019, 09/21/2019   PNEUMOCOCCAL CONJUGATE-20 11/10/2021   Pfizer(Comirnaty)Fall Seasonal Vaccine 12 years and older 05/06/2023   Zoster Recombinant(Shingrix ) 12/07/2022    Past Medical History:  Diagnosis Date   Ascending aortic aneurysm 03/19/2022   AV block, 2nd degree    BPH (benign prostatic hyperplasia)    CHF (congestive heart failure) (HCC)    Chronic kidney disease    ESRD on HD at [facility]  via Right IJ   Chronic pleural effusion    Coronary artery calcification 03/19/2022   Erectile dysfunction    Gout    Heart murmur    Hyperlipidemia    Hypertension    Obesity    Pneumonia     Tobacco History: Social History   Tobacco Use  Smoking Status Never  Smokeless Tobacco Never   Counseling given: Not Answered   Continue to not smoke  Outpatient Encounter Medications as of 09/20/2024  Medication Sig   acetaminophen  (TYLENOL ) 500 MG tablet Take 1,000 mg by mouth every 8 (eight) hours as needed for moderate pain (pain score 4-6).   albuterol  (VENTOLIN  HFA) 108 (90 Base) MCG/ACT inhaler Inhale 1-2 puffs into the lungs every 6 (six) hours as needed for wheezing or shortness of breath.   allopurinol  (ZYLOPRIM ) 100 MG tablet TAKE 1 TABLET BY MOUTH AFTER DIALYSIS ON MONDAY, TUESDAY, THURSDAY, AND FRIDAY. TITRATE AS RECOMMENDED BY NEPHROLOGY.   benzonatate (TESSALON) 100 MG capsule Take 100 mg by mouth 3 (three) times daily. (Patient taking differently: Take 100 mg by mouth 3 (three) times daily. AS NEEDED)   calcium  acetate (PHOSLO ) 667 MG capsule Take 1 capsule (667 mg total) by mouth 3 (three) times daily.   finasteride  (PROSCAR ) 5 MG tablet TAKE 1 TABLET BY MOUTH EVERY DAY   fluticasone -salmeterol  (ADVAIR) 250-50 MCG/ACT AEPB Inhale 1 puff into the lungs in the morning and at bedtime.   hydrALAZINE  (APRESOLINE ) 100 MG tablet TAKE 1 TABLET BY MOUTH 3 TIMES DAILY.   rosuvastatin  (CRESTOR ) 10 MG tablet Take 10 mg by mouth daily.   tamsulosin  (FLOMAX ) 0.4 MG CAPS capsule TAKE 1 CAPSLE BY MOUTH DAILY   torsemide  (DEMADEX ) 20 MG tablet Take 40 mg by mouth daily.  vutrisiran  sodium (AMVUTTRA ) 25 MG/0.5ML syringe Inject 0.5 mLs (25 mg total) into the skin every 3 (three) months.   No facility-administered encounter medications on file as of 09/20/2024.     Review of Systems  Review of Systems  N/a Physical Exam  BP 107/86   Pulse 81   Temp 98.2 F (36.8 C) (Oral)   Ht 5' 6 (1.676 m)   Wt 222 lb (100.7 kg)   SpO2 95% Comment: on RA  BMI 35.83 kg/m   Wt Readings from Last 5 Encounters:  09/20/24 222 lb (100.7 kg)  08/01/24 226 lb (102.5 kg)  08/01/24 226 lb (102.5 kg)  06/27/24 221 lb (100.2 kg)  04/13/24 225 lb (102.1 kg)    BMI Readings from Last 5 Encounters:  09/20/24 35.83 kg/m  08/01/24 36.48 kg/m  08/01/24 36.48 kg/m  06/27/24 35.67 kg/m  04/13/24 36.32 kg/m     Physical Exam General: In chair no distress Eyes: No icterus Neck: No JVP Pulmonary: Distant, very diminished in the left base compared to the right, otherwise clear Cardiovascular: Warm, no edema in the lower extremities   Assessment & Plan:   Pleural effusion, bilateral, left greater than right: Present since 07/2023 persistent 08/2023.  Status post thoracentesis 07/2023.  Seen again on MRI 04/2024 prompting referral.  Lymphocyte predominance likely due to the chronicity.  Most likely related to pulmonary venous hypertension given bilateral nature.  As well as transudative nature of fluid.  Repeat thoracentesis ordered now more dyspneic and reoccurrence, reaccumulation of fluid.  Dyspnea on exertion: Related to pleural effusion.  PFTs ordered in the past not performed.  Seems to do well in  between episodes of fluid accumulation so other etiologies felt less likely.   Return if symptoms worsen or fail to improve, for f/u Dr. Annella.  Donnice JONELLE Annella, MD 09/20/2024

## 2024-09-20 NOTE — Progress Notes (Signed)
 r

## 2024-09-20 NOTE — Patient Instructions (Signed)
 Next you again  The chest x-ray shows increase in the fluid, I also hear it on your exam  I ordered a thoracentesis to be done as soon as possible.  Hopefully it can be achieved in the next couple of days.  No later than early next week.  I will send a message to Dr. Marlee and Dr. Zenaida regarding the diuretics.  I think we may need to increase them to see if we can keep the fluid away for a bit longer.  No changes to medication list to hear from them.  Return to clinic as needed based on symptoms of shortness of breath for evaluation of pleural effusion

## 2024-09-21 ENCOUNTER — Ambulatory Visit: Payer: Self-pay | Admitting: Pulmonary Disease

## 2024-09-21 NOTE — Telephone Encounter (Signed)
 Daughter calling back, states someone called from the office about a thoracentesis. Called the practice.[place] states it was probably Immunologist [phone], number given to daughter, [place].

## 2024-09-21 NOTE — Telephone Encounter (Signed)
 FYI Only or Action Required?: FYI only for provider: ED advised.  Patient is followed in Pulmonology for pleural effusion, last seen on 09/20/2024 by Hunsucker, Donnice SAUNDERS, MD.  Called Nurse Triage reporting Shortness of Breath.  Symptoms began today.  Interventions attempted: Other: sleeping sitting up, not lying flat.  All other regularly prescribed treatments.  Symptoms are: rapidly worsening.  Triage Disposition: Go to ED Now (Notify PCP)  Patient/caregiver understands and will follow disposition?: Yes  Patient/caregiver understands and will follow disposition?: Yes Reason for Triage: Pt's daughter is calling on the pt's behalf due to the pt having concerns with SOB today. Pt has been sleeping upright but leaning forward making it difficult to sleep and breathe as well. She is seeking an appt today if possible with either Dr. Annella or another pulm provider that's available.  Pt sees Dr. Annella at the [facility] clinic.   Reason for Disposition  [1] MODERATE difficulty breathing (e.g., speaks in phrases, SOB even at rest, pulse 100-120) AND [2] NEW-onset or WORSE than normal  Answer Assessment - Initial Assessment Questions Daughter of patient calling in to report that patient's condition has worsened since seen in office by  Dr. Annella yesterday.  Reports increased shortness of breath with abdominal retractions.  ED advised Attempted to call [facility] x 2, no answer  Protocols used: Breathing Difficulty-A-AH

## 2024-09-24 ENCOUNTER — Other Ambulatory Visit (HOSPITAL_COMMUNITY)

## 2024-10-22 ENCOUNTER — Ambulatory Visit (HOSPITAL_COMMUNITY)

## 2024-11-06 ENCOUNTER — Ambulatory Visit (HOSPITAL_COMMUNITY): Admit: 2024-11-06 | Admitting: Gastroenterology

## 2024-11-06 ENCOUNTER — Encounter (HOSPITAL_COMMUNITY): Payer: Self-pay

## 2025-08-05 ENCOUNTER — Ambulatory Visit: Payer: Self-pay | Admitting: Internal Medicine

## 2025-08-14 ENCOUNTER — Ambulatory Visit: Payer: Self-pay
